# Patient Record
Sex: Male | Born: 1945 | Race: Black or African American | Hispanic: No | Marital: Married | Smoking: Former smoker
Health system: Southern US, Community
[De-identification: ages and names within clinical notes are randomized; demographics above are authoritative.]

## PROBLEM LIST (undated history)

## (undated) DIAGNOSIS — I739 Peripheral vascular disease, unspecified: Secondary | ICD-10-CM

## (undated) DIAGNOSIS — R972 Elevated prostate specific antigen [PSA]: Secondary | ICD-10-CM

## (undated) DIAGNOSIS — H9190 Unspecified hearing loss, unspecified ear: Secondary | ICD-10-CM

## (undated) DIAGNOSIS — E119 Type 2 diabetes mellitus without complications: Secondary | ICD-10-CM

## (undated) DIAGNOSIS — Z974 Presence of external hearing-aid: Secondary | ICD-10-CM

## (undated) DIAGNOSIS — Z789 Other specified health status: Secondary | ICD-10-CM

## (undated) DIAGNOSIS — C679 Malignant neoplasm of bladder, unspecified: Secondary | ICD-10-CM

## (undated) DIAGNOSIS — E785 Hyperlipidemia, unspecified: Secondary | ICD-10-CM

## (undated) DIAGNOSIS — I214 Non-ST elevation (NSTEMI) myocardial infarction: Secondary | ICD-10-CM

## (undated) DIAGNOSIS — I10 Essential (primary) hypertension: Secondary | ICD-10-CM

## (undated) DIAGNOSIS — R944 Abnormal results of kidney function studies: Secondary | ICD-10-CM

## (undated) DIAGNOSIS — I2510 Atherosclerotic heart disease of native coronary artery without angina pectoris: Secondary | ICD-10-CM

## (undated) DIAGNOSIS — M1990 Unspecified osteoarthritis, unspecified site: Secondary | ICD-10-CM

---

## 2000-09-06 ENCOUNTER — Encounter: Payer: Self-pay | Admitting: *Deleted

## 2000-09-06 ENCOUNTER — Ambulatory Visit (HOSPITAL_COMMUNITY): Admission: RE | Admit: 2000-09-06 | Discharge: 2000-09-06 | Payer: Self-pay | Admitting: *Deleted

## 2002-09-10 ENCOUNTER — Ambulatory Visit (HOSPITAL_COMMUNITY): Admission: RE | Admit: 2002-09-10 | Discharge: 2002-09-10 | Payer: Self-pay | Admitting: Family Medicine

## 2002-09-10 ENCOUNTER — Encounter: Payer: Self-pay | Admitting: Family Medicine

## 2006-05-04 ENCOUNTER — Ambulatory Visit (HOSPITAL_COMMUNITY): Admission: RE | Admit: 2006-05-04 | Discharge: 2006-05-04 | Payer: Self-pay | Admitting: Family Medicine

## 2006-06-19 ENCOUNTER — Ambulatory Visit (HOSPITAL_COMMUNITY): Admission: RE | Admit: 2006-06-19 | Discharge: 2006-06-19 | Payer: Self-pay | Admitting: Internal Medicine

## 2006-06-19 ENCOUNTER — Ambulatory Visit: Payer: Self-pay | Admitting: Internal Medicine

## 2012-07-31 ENCOUNTER — Ambulatory Visit (HOSPITAL_COMMUNITY)
Admission: RE | Admit: 2012-07-31 | Discharge: 2012-07-31 | Disposition: A | Discharge status: Home / Self Care | Payer: Medicare Other | Source: Ambulatory Visit | Attending: Physician Assistant | Admitting: Physician Assistant

## 2012-07-31 ENCOUNTER — Other Ambulatory Visit (HOSPITAL_COMMUNITY): Payer: Self-pay | Admitting: Physician Assistant

## 2012-07-31 DIAGNOSIS — R079 Chest pain, unspecified: Secondary | ICD-10-CM | POA: Insufficient documentation

## 2012-07-31 DIAGNOSIS — X500XXA Overexertion from strenuous movement or load, initial encounter: Secondary | ICD-10-CM | POA: Insufficient documentation

## 2012-07-31 DIAGNOSIS — S298XXA Other specified injuries of thorax, initial encounter: Secondary | ICD-10-CM | POA: Insufficient documentation

## 2012-10-01 ENCOUNTER — Other Ambulatory Visit (HOSPITAL_COMMUNITY): Payer: Self-pay | Admitting: Family Medicine

## 2012-10-01 DIAGNOSIS — N644 Mastodynia: Secondary | ICD-10-CM

## 2012-10-02 ENCOUNTER — Ambulatory Visit: Payer: Medicare Other | Admitting: Urology

## 2012-10-10 ENCOUNTER — Ambulatory Visit (HOSPITAL_COMMUNITY)
Admission: RE | Admit: 2012-10-10 | Discharge: 2012-10-10 | Disposition: A | Discharge status: Home / Self Care | Payer: Medicare Other | Source: Ambulatory Visit | Attending: Family Medicine | Admitting: Family Medicine

## 2012-10-10 DIAGNOSIS — N644 Mastodynia: Secondary | ICD-10-CM

## 2013-01-29 ENCOUNTER — Ambulatory Visit: Payer: Medicare Other | Admitting: Urology

## 2013-02-26 ENCOUNTER — Ambulatory Visit (INDEPENDENT_AMBULATORY_CARE_PROVIDER_SITE_OTHER): Payer: Medicare Other | Admitting: Urology

## 2013-02-26 ENCOUNTER — Other Ambulatory Visit: Payer: Self-pay | Admitting: Urology

## 2013-02-26 DIAGNOSIS — R310 Gross hematuria: Secondary | ICD-10-CM

## 2013-02-26 DIAGNOSIS — N400 Enlarged prostate without lower urinary tract symptoms: Secondary | ICD-10-CM

## 2013-02-26 DIAGNOSIS — R972 Elevated prostate specific antigen [PSA]: Secondary | ICD-10-CM

## 2013-02-26 DIAGNOSIS — C679 Malignant neoplasm of bladder, unspecified: Secondary | ICD-10-CM

## 2013-03-01 ENCOUNTER — Ambulatory Visit (HOSPITAL_COMMUNITY)
Admission: RE | Admit: 2013-03-01 | Discharge: 2013-03-01 | Disposition: A | Discharge status: Home / Self Care | Payer: Medicare Other | Source: Ambulatory Visit | Attending: Urology | Admitting: Urology

## 2013-03-01 DIAGNOSIS — N329 Bladder disorder, unspecified: Secondary | ICD-10-CM | POA: Insufficient documentation

## 2013-03-01 DIAGNOSIS — I10 Essential (primary) hypertension: Secondary | ICD-10-CM | POA: Insufficient documentation

## 2013-03-01 DIAGNOSIS — E119 Type 2 diabetes mellitus without complications: Secondary | ICD-10-CM | POA: Insufficient documentation

## 2013-03-01 DIAGNOSIS — R319 Hematuria, unspecified: Secondary | ICD-10-CM | POA: Insufficient documentation

## 2013-03-01 DIAGNOSIS — K7689 Other specified diseases of liver: Secondary | ICD-10-CM | POA: Insufficient documentation

## 2013-03-01 DIAGNOSIS — K5730 Diverticulosis of large intestine without perforation or abscess without bleeding: Secondary | ICD-10-CM | POA: Insufficient documentation

## 2013-03-01 DIAGNOSIS — R310 Gross hematuria: Secondary | ICD-10-CM

## 2013-03-01 MED ORDER — IOHEXOL 300 MG/ML  SOLN
125.0000 mL | Freq: Once | INTRAMUSCULAR | Status: AC | PRN
  Administered 2013-03-01: 125 mL via INTRAVENOUS

## 2013-03-05 ENCOUNTER — Other Ambulatory Visit: Payer: Self-pay | Admitting: Urology

## 2013-03-07 ENCOUNTER — Encounter (HOSPITAL_BASED_OUTPATIENT_CLINIC_OR_DEPARTMENT_OTHER): Payer: Self-pay | Admitting: *Deleted

## 2013-03-08 MED ORDER — MITOMYCIN CHEMO FOR BLADDER INSTILLATION 40 MG: 40.0000 mg | Freq: Once | INTRAVENOUS | Status: DC

## 2013-03-11 ENCOUNTER — Encounter (HOSPITAL_BASED_OUTPATIENT_CLINIC_OR_DEPARTMENT_OTHER): Payer: Self-pay | Admitting: *Deleted

## 2013-03-11 NOTE — Progress Notes (Signed)
NPO AFTER MN. ARRIVES AT 0615. NEEDS ISTAT AND EKG.

## 2013-03-13 NOTE — H&P (Signed)
  Urology History and Physical Exam  CC: Bladder tumor  HPI: 67 year old male presents for TUR-BT of a newly discovered 2 cm papillary bladder tumor. He as been followed for an elevated PSA but was recently found to have hematuria with findings of a bladder tumor.  PMH: Past Medical History  Diagnosis Date  . Bladder tumor   . Diabetes mellitus, type 2   . Hypertension   . Gross hematuria     PSH: History reviewed. No pertinent past surgical history.  Allergies: No Known Allergies  Medications: No prescriptions prior to admission     Social History: History   Social History  . Marital Status: Married    Spouse Name: N/A    Number of Children: N/A  . Years of Education: N/A   Occupational History  . Not on file.   Social History Main Topics  . Smoking status: Former Smoker -- 1.00 packs/day for 30 years    Types: Cigarettes    Quit date: 03/11/1993  . Smokeless tobacco: Never Used  . Alcohol Use: No  . Drug Use: No  . Sexually Active: Not on file   Other Topics Concern  . Not on file   Social History Narrative  . No narrative on file    Family History: History reviewed. No pertinent family history.  Review of Systems: Positive: Blood in urine Negative:  A further 10 point review of systems was negative except what is listed in the HPI.  Physical Exam: @VITALS2 @ General: No acute distress.  Awake. Head:  Normocephalic.  Atraumatic. ENT:  EOMI.  Mucous membranes moist Neck:  Supple.  No lymphadenopathy. CV:  S1 present. S2 present. Regular rate. Pulmonary: Equal effort bilaterally.  Clear to auscultation bilaterally. Abdomen: Soft.  Nn tender to palpation. Skin:  Normal turgor.  No visible rash. Extremity: No gross deformity of bilateral upper extremities.  No gross deformity of    bilateral lower extremities. Neurologic: Alert. Appropriate mood.    Studies:  No results found for this basename: HGB, WBC, PLT,  in the last 72 hours  No  results found for this basename: NA, K, CL, CO2, BUN, CREATININE, CALCIUM, MAGNESIUM, GFRNONAA, GFRAA,  in the last 72 hours   No results found for this basename: PT, INR, APTT,  in the last 72 hours   No components found with this basename: ABG,     Assessment:  Bladder tumor  Plan: TUR-BT

## 2013-03-14 ENCOUNTER — Ambulatory Visit (HOSPITAL_BASED_OUTPATIENT_CLINIC_OR_DEPARTMENT_OTHER): Payer: Medicare Other | Admitting: Anesthesiology

## 2013-03-14 ENCOUNTER — Ambulatory Visit (HOSPITAL_BASED_OUTPATIENT_CLINIC_OR_DEPARTMENT_OTHER)
Admission: RE | Admit: 2013-03-14 | Discharge: 2013-03-14 | Disposition: A | Discharge status: Home / Self Care | Payer: Medicare Other | Source: Ambulatory Visit | Attending: Urology | Admitting: Urology

## 2013-03-14 ENCOUNTER — Encounter (HOSPITAL_BASED_OUTPATIENT_CLINIC_OR_DEPARTMENT_OTHER): Payer: Self-pay | Admitting: Anesthesiology

## 2013-03-14 ENCOUNTER — Encounter (HOSPITAL_BASED_OUTPATIENT_CLINIC_OR_DEPARTMENT_OTHER): Payer: Self-pay

## 2013-03-14 ENCOUNTER — Encounter (HOSPITAL_BASED_OUTPATIENT_CLINIC_OR_DEPARTMENT_OTHER)
Admission: RE | Disposition: A | Discharge status: Home / Self Care | Payer: Self-pay | Source: Ambulatory Visit | Attending: Urology

## 2013-03-14 DIAGNOSIS — E669 Obesity, unspecified: Secondary | ICD-10-CM | POA: Insufficient documentation

## 2013-03-14 DIAGNOSIS — C679 Malignant neoplasm of bladder, unspecified: Secondary | ICD-10-CM | POA: Insufficient documentation

## 2013-03-14 DIAGNOSIS — Z87891 Personal history of nicotine dependence: Secondary | ICD-10-CM | POA: Insufficient documentation

## 2013-03-14 DIAGNOSIS — I10 Essential (primary) hypertension: Secondary | ICD-10-CM | POA: Insufficient documentation

## 2013-03-14 DIAGNOSIS — Z6832 Body mass index (BMI) 32.0-32.9, adult: Secondary | ICD-10-CM | POA: Insufficient documentation

## 2013-03-14 DIAGNOSIS — E119 Type 2 diabetes mellitus without complications: Secondary | ICD-10-CM | POA: Insufficient documentation

## 2013-03-14 HISTORY — DX: Type 2 diabetes mellitus without complications: E11.9

## 2013-03-14 HISTORY — PX: TRANSURETHRAL RESECTION OF BLADDER TUMOR: SHX2575

## 2013-03-14 HISTORY — DX: Essential (primary) hypertension: I10

## 2013-03-14 LAB — POCT I-STAT 4, (NA,K, GLUC, HGB,HCT): Sodium: 140 mEq/L (ref 135–145)

## 2013-03-14 SURGERY — TURBT (TRANSURETHRAL RESECTION OF BLADDER TUMOR)
Anesthesia: General | Site: Bladder | Wound class: Clean Contaminated

## 2013-03-14 MED ORDER — FENTANYL CITRATE 0.05 MG/ML IJ SOLN
INTRAMUSCULAR | Status: DC | PRN
  Administered 2013-03-14 (×2): 50 ug via INTRAVENOUS

## 2013-03-14 MED ORDER — STERILE WATER FOR IRRIGATION IR SOLN
Status: DC | PRN
  Administered 2013-03-14: 1000 mL

## 2013-03-14 MED ORDER — MITOMYCIN CHEMO FOR BLADDER INSTILLATION 40 MG
40.0000 mg | Freq: Once | INTRAVENOUS | Status: AC
  Administered 2013-03-14: 40 mg via INTRAVESICAL
  Filled 2013-03-14: qty 40

## 2013-03-14 MED ORDER — OXYCODONE HCL 5 MG/5ML PO SOLN
5.0000 mg | Freq: Once | ORAL | Status: DC | PRN
  Filled 2013-03-14: qty 5

## 2013-03-14 MED ORDER — DEXAMETHASONE SODIUM PHOSPHATE 4 MG/ML IJ SOLN
INTRAMUSCULAR | Status: DC | PRN
  Administered 2013-03-14: 10 mg via INTRAVENOUS

## 2013-03-14 MED ORDER — HYDROMORPHONE HCL PF 1 MG/ML IJ SOLN
0.2500 mg | INTRAMUSCULAR | Status: DC | PRN
  Administered 2013-03-14: 0.25 mg via INTRAVENOUS
  Filled 2013-03-14: qty 1

## 2013-03-14 MED ORDER — LIDOCAINE HCL (CARDIAC) 20 MG/ML IV SOLN
INTRAVENOUS | Status: DC | PRN
  Administered 2013-03-14: 100 mg via INTRAVENOUS

## 2013-03-14 MED ORDER — ACETAMINOPHEN 10 MG/ML IV SOLN
INTRAVENOUS | Status: DC | PRN
  Administered 2013-03-14: 1000 mg via INTRAVENOUS

## 2013-03-14 MED ORDER — CIPROFLOXACIN IN D5W 400 MG/200ML IV SOLN
400.0000 mg | INTRAVENOUS | Status: AC
  Administered 2013-03-14: 400 mg via INTRAVENOUS
  Filled 2013-03-14: qty 200

## 2013-03-14 MED ORDER — CEPHALEXIN 500 MG PO CAPS: 500.0000 mg | ORAL_CAPSULE | Freq: Two times a day (BID) | ORAL | Status: DC

## 2013-03-14 MED ORDER — SODIUM CHLORIDE 0.9 % IR SOLN
Status: DC | PRN
  Administered 2013-03-14: 6000 mL

## 2013-03-14 MED ORDER — PROMETHAZINE HCL 25 MG/ML IJ SOLN
6.2500 mg | INTRAMUSCULAR | Status: DC | PRN
  Filled 2013-03-14: qty 1

## 2013-03-14 MED ORDER — PROPOFOL 10 MG/ML IV BOLUS
INTRAVENOUS | Status: DC | PRN
  Administered 2013-03-14: 200 mg via INTRAVENOUS

## 2013-03-14 MED ORDER — MEPERIDINE HCL 25 MG/ML IJ SOLN
6.2500 mg | INTRAMUSCULAR | Status: DC | PRN
  Filled 2013-03-14: qty 1

## 2013-03-14 MED ORDER — ONDANSETRON HCL 4 MG/2ML IJ SOLN
INTRAMUSCULAR | Status: DC | PRN
  Administered 2013-03-14: 4 mg via INTRAVENOUS

## 2013-03-14 MED ORDER — HYDROCODONE-ACETAMINOPHEN 5-325 MG PO TABS
1.0000 | ORAL_TABLET | Freq: Once | ORAL | Status: AC
  Administered 2013-03-14: 1 via ORAL
  Filled 2013-03-14: qty 1

## 2013-03-14 MED ORDER — MIDAZOLAM HCL 5 MG/5ML IJ SOLN
INTRAMUSCULAR | Status: DC | PRN
  Administered 2013-03-14: 2 mg via INTRAVENOUS

## 2013-03-14 MED ORDER — HYDROCODONE-ACETAMINOPHEN 5-325 MG PO TABS: 1.0000 | ORAL_TABLET | ORAL | Status: DC | PRN

## 2013-03-14 MED ORDER — ACETAMINOPHEN 10 MG/ML IV SOLN
1000.0000 mg | Freq: Once | INTRAVENOUS | Status: DC | PRN
  Filled 2013-03-14: qty 100

## 2013-03-14 MED ORDER — LACTATED RINGERS IV SOLN
INTRAVENOUS | Status: DC
  Administered 2013-03-14: 07:00:00 via INTRAVENOUS
  Filled 2013-03-14: qty 1000

## 2013-03-14 MED ORDER — BELLADONNA ALKALOIDS-OPIUM 16.2-60 MG RE SUPP
RECTAL | Status: DC | PRN
  Administered 2013-03-14: 1 via RECTAL

## 2013-03-14 MED ORDER — OXYCODONE HCL 5 MG PO TABS
5.0000 mg | ORAL_TABLET | Freq: Once | ORAL | Status: DC | PRN
  Filled 2013-03-14: qty 1

## 2013-03-14 SURGICAL SUPPLY — 31 items
BAG DRAIN URO-CYSTO SKYTR STRL (DRAIN) ×2 IMPLANT
BAG DRN ANRFLXCHMBR STRAP LEK (BAG)
BAG DRN UROCATH (DRAIN) ×1
BAG URINE DRAINAGE (UROLOGICAL SUPPLIES) ×1 IMPLANT
BAG URINE LEG 19OZ MD ST LTX (BAG) IMPLANT
CANISTER SUCT LVC 12 LTR MEDI- (MISCELLANEOUS) IMPLANT
CATH FOLEY 2WAY SLVR  5CC 20FR (CATHETERS)
CATH FOLEY 2WAY SLVR  5CC 22FR (CATHETERS)
CATH FOLEY 2WAY SLVR 5CC 20FR (CATHETERS) IMPLANT
CATH FOLEY 2WAY SLVR 5CC 22FR (CATHETERS) IMPLANT
CLOTH BEACON ORANGE TIMEOUT ST (SAFETY) ×2 IMPLANT
DRAPE CAMERA CLOSED 9X96 (DRAPES) ×2 IMPLANT
ELECT BUTTON BIOP 24F 90D PLAS (MISCELLANEOUS) IMPLANT
ELECT LOOP HF 26F 30D .35MM (CUTTING LOOP) IMPLANT
ELECT LOOP MED HF 24F 12D CBL (CLIP) ×1 IMPLANT
ELECT NEEDLE 45D HF 24-28F 12D (CUTTING LOOP) IMPLANT
ELECT REM PT RETURN 9FT ADLT (ELECTROSURGICAL) ×2
ELECTRODE REM PT RTRN 9FT ADLT (ELECTROSURGICAL) ×1 IMPLANT
EVACUATOR MICROVAS BLADDER (UROLOGICAL SUPPLIES) IMPLANT
GLOVE BIO SURGEON STRL SZ7 (GLOVE) ×1 IMPLANT
GLOVE BIO SURGEON STRL SZ8 (GLOVE) ×2 IMPLANT
GLOVE INDICATOR 7.5 STRL GRN (GLOVE) ×1 IMPLANT
GOWN STRL REIN XL XLG (GOWN DISPOSABLE) ×2 IMPLANT
GOWN XL W/COTTON TOWEL STD (GOWNS) ×2 IMPLANT
HOLDER FOLEY CATH W/STRAP (MISCELLANEOUS) IMPLANT
KIT ASPIRATION TUBING (SET/KITS/TRAYS/PACK) IMPLANT
LOOP CUTTING 24FR OLYMPUS (CUTTING LOOP) IMPLANT
PACK CYSTOSCOPY (CUSTOM PROCEDURE TRAY) ×2 IMPLANT
PLUG CATH AND CAP STER (CATHETERS) IMPLANT
SET ASPIRATION TUBING (TUBING) IMPLANT
SYRINGE IRR TOOMEY STRL 70CC (SYRINGE) IMPLANT

## 2013-03-14 NOTE — Preoperative (Signed)
Beta Blockers   Reason not to administer Beta Blockers:Hold beta blocker due to other 

## 2013-03-14 NOTE — Anesthesia Preprocedure Evaluation (Addendum)
Anesthesia Evaluation  Patient identified by MRN, date of birth, ID band Patient awake    Reviewed: Allergy & Precautions, H&P , NPO status , Patient's Chart, lab work & pertinent test results, reviewed documented beta blocker date and time   Airway Mallampati: II TM Distance: >3 FB Neck ROM: Full    Dental  (+) Dental Advisory Given   Pulmonary neg pulmonary ROS,  breath sounds clear to auscultation        Cardiovascular hypertension, Pt. on medications and Pt. on home beta blockers Rhythm:Regular Rate:Normal     Neuro/Psych negative neurological ROS  negative psych ROS   GI/Hepatic negative GI ROS, Neg liver ROS,   Endo/Other  diabetes, Type 2, Oral Hypoglycemic Agents  Renal/GU negative Renal ROS     Musculoskeletal negative musculoskeletal ROS (+)   Abdominal (+) + obese,   Peds  Hematology negative hematology ROS (+)   Anesthesia Other Findings   Reproductive/Obstetrics                          Anesthesia Physical Anesthesia Plan  ASA: III  Anesthesia Plan: General   Post-op Pain Management:    Induction: Intravenous  Airway Management Planned: LMA  Additional Equipment:   Intra-op Plan:   Post-operative Plan: Extubation in OR  Informed Consent: I have reviewed the patients History and Physical, chart, labs and discussed the procedure including the risks, benefits and alternatives for the proposed anesthesia with the patient or authorized representative who has indicated his/her understanding and acceptance.   Dental advisory given  Plan Discussed with: CRNA  Anesthesia Plan Comments:         Anesthesia Quick Evaluation

## 2013-03-14 NOTE — Anesthesia Procedure Notes (Signed)
Procedure Name: LMA Insertion Date/Time: 03/14/2013 7:35 AM Performed by: Maris Berger T Pre-anesthesia Checklist: Patient identified, Emergency Drugs available, Suction available and Patient being monitored Patient Re-evaluated:Patient Re-evaluated prior to inductionOxygen Delivery Method: Circle System Utilized Preoxygenation: Pre-oxygenation with 100% oxygen Intubation Type: IV induction Ventilation: Mask ventilation without difficulty LMA: LMA inserted LMA Size: 5.0 Number of attempts: 1 Airway Equipment and Method: bite block Placement Confirmation: positive ETCO2 Dental Injury: Teeth and Oropharynx as per pre-operative assessment

## 2013-03-14 NOTE — Discharge Instructions (Signed)
Transurethral Resection of Bladder Tumor (TURBT)  °Definition:  °Transurethral Resection of the Bladder Tumor is a surgical procedure used to diagnose and remove tumors within the bladder. TURBT is the most common treatment for early stage bladder cancer.  ° °General instructions:  °Your recent bladder surgery requires very little post hospital care but some definite precautions.  °Despite the fact that no skin incisions were used, the area around the tumor removal site is raw and covered with scabs to promote healing and prevent bleeding. Certain precautions are needed to insure that the scabs are not disturbed over the next 2-4 weeks while the healing proceeds.  °Because the raw surface inside your bladder and the irritating effects of urine you may expect frequency of urination and/or urgency (a stronger desire to urinate) and perhaps even getting up at night more often. This will usually resolve or improve slowly over the healing period. You may see some blood in your urine over the first 6 weeks. Do not be alarmed, even if the urine was clear for a while. Get off your feet and drink lots of fluids until clearing occurs. If you start to pass clots or don't improve call us.  ° °Diet:  °You may return to your normal diet immediately. Because of the raw surface of your bladder, alcohol, spicy foods, foods high in acid and drinks with caffeine may cause irritation or frequency and should be used in moderation. To keep your urine flowing freely and avoid constipation, drink plenty of fluids during the day (8-10 glasses). Tip: Avoid cranberry juice because it is very acidic. °  °Activity:  °Your physical activity needs to be restricted somewhat.  We suggest that you reduce your activity under the circumstances until the bleeding has stopped. Heavy lifting (greater than 20 lbs.) and heavy exertion should be limited for 2-3 weeks. ° °Bowels:  °It is important to keep your bowels regular during the postoperative period.  Straining with bowel movements can cause bleeding. A bowel movement every other day is reasonable. Use a mild laxative if needed, such as milk of magnesia 2-3 tablespoons, or 2 Dulcolax tablets. Call if you continue to have problems. If you had been taking narcotics for pain, before, during or after your surgery, you may be constipated.  ° °Medication:  °You should resume your pre-surgery medications unless told not to. In addition you may be given an antibiotic to prevent or treat infection. Antibiotics are not always necessary. All medication should be taken as prescribed until the bottles are finished unless you are having an unusual reaction to one of the drugs.  °General Anesthetic, Adult  °A doctor specialized in giving anesthesia (anesthesiologist) or a nurse specialized in giving anesthesia (nurse anesthetist) gives medicine that makes you sleep while a procedure is performed (general anesthetic). Once the general anesthetic has been administered, you will be in a sleeplike state in which you feel no pain. After having a general anesthetic you may feel:  °Dizzy.  °Weak.  °Drowsy.  °Confused.  °These feelings are normal and can be expected to last for up to 24 hours after the procedure. ° ° °LET YOUR CAREGIVER KNOW ABOUT:  °Allergies you have.  °Medications you are taking, including herbs, eye drops, over the counter medications, dietary supplements, and creams.  °Previous problems you have had with anesthetics or numbing medicines.  °Use of cigarettes, alcohol, or illicit drugs.  °Possibility of pregnancy, if this applies.  °History of bleeding or blood disorders, including blood clots and clotting   disorders.  °Previous surgeries you have had and types of anesthetics you have received.  °Family medical history, especially anesthetic problems.  °Other health problems.  ° °AFTER THE PROCEDURE  °After surgery, you will be taken to the recovery area where a nurse will monitor your progress. You will be allowed  to go home when you are awake, stable, taking fluids well, and without serious pain or complications.  °For the first 24 hours following an anesthetic:  °Have a responsible person with you.  °Do not drive a car. If you are alone, do not take public transportation.  °Do not engage in strenuous activity. You may usually resume normal activities the next day, or as advised by your caregiver.  °Do not drink alcohol.  °Do not take medicine that has not been prescribed by your caregiver.  °Do not sign important papers or make important decisions as your judgement may be impaired.  °You may resume a normal diet as directed.  °Change bandages (dressings) as directed.  °Only take over-the-counter or prescription medicines for pain, discomfort, or fever as directed by your caregiver.  °If you have questions or problems that seem related to the anesthetic, call the hospital and ask for the anesthetist, anesthesiologist, or anesthesia department.  ° °SEEK IMMEDIATE MEDICAL CARE IF:  °You develop a rash.  °You have difficulty breathing.  °You have chest pain.  °You have allergic problems.  °You have uncontrolled nausea.  °You have uncontrolled vomiting.  °You develop any serious bleeding, especially from the incision site.  °Document Released: 02/28/2008 Document Revised: 08/03/2011 Document Reviewed: 03/24/2011  °[employer]® Patient Information ©2012 [employer].  ° ° °Post Anesthesia Home Care Instructions ° °Activity: °Get plenty of rest for the remainder of the day. A responsible adult should stay with you for 24 hours following the procedure.  °For the next 24 hours, DO NOT: °-Drive a car °-Operate machinery °-Drink alcoholic beverages °-Take any medication unless instructed by your physician °-Make any legal decisions or sign important papers. ° °Meals: °Start with liquid foods such as gelatin or soup. Progress to regular foods as tolerated. Avoid greasy, spicy, heavy foods. If nausea and/or vomiting occur, drink only  clear liquids until the nausea and/or vomiting subsides. Call your physician if vomiting continues. ° °Special Instructions/Symptoms: °Your throat may feel dry or sore from the anesthesia or the breathing tube placed in your throat during surgery. If this causes discomfort, gargle with warm salt water. The discomfort should disappear within 24 hours. ° °

## 2013-03-14 NOTE — Transfer of Care (Signed)
Immediate Anesthesia Transfer of Care Note  Patient: Timothy [name]  Procedure(s) Performed: Procedure(s) with comments: TRANSURETHRAL RESECTION OF BLADDER TUMOR (TURBT) (N/A) - 1 HR WITH MITOMYCIN INSTILLATION   Patient Location: PACU  Anesthesia Type:General  Level of Consciousness: sedated  Airway & Oxygen Therapy: Patient Spontanous Breathing and Patient connected to face mask oxygen  Post-op Assessment: Report given to PACU RN  Post vital signs: Reviewed and stable  Complications: No apparent anesthesia complications

## 2013-03-14 NOTE — Anesthesia Postprocedure Evaluation (Signed)
Anesthesia Post Note  Patient: Timothy [name]  Procedure(s) Performed: Procedure(s) (LRB): TRANSURETHRAL RESECTION OF BLADDER TUMOR (TURBT) (N/A)  Anesthesia type: General  Patient location: PACU  Post pain: Pain level controlled  Post assessment: Post-op Vital signs reviewed  Last Vitals: BP 126/79  Pulse 66  Temp(Src) 36.4 C (Oral)  Resp 12  Ht 5\' 8"  (1.727 m)  Wt 216 lb (97.977 kg)  BMI 32.85 kg/m2  SpO2 92%  Post vital signs: Reviewed  Level of consciousness: sedated  Complications: No apparent anesthesia complications

## 2013-03-14 NOTE — Op Note (Signed)
Preoperative diagnosis: Bladder tumor, 15 mm  Postoperative diagnosis: Same   Procedure: TURBT, placement of mitomycin in bladder    Surgeon: Bertram Millard. Briannia Laba, M.D.   Anesthesia: Gen.   Complications: None  Specimen(s): Bladder tumor, to pathology  Drain(s): 18 French Foley catheter  Indications: 67 year-old male with recently diagnosed bladder tumor. He has a left bladder wall tumor above the trigone, approximately 15 mm in size. He presents at this time for TURBT. Risks and complications have been discussed with him. He understands these and desires to proceed.    Technique and findings: The patient was properly identified in the holding area and received preoperative IV antibiotics. He was taken to the operating room where general anesthetic was administered with the LMA. He was placed in the dorsolithotomy position. Genitalia and perineum were prepped and draped. Proper timeout was then performed.  A 26 French resectoscope sheath was placed with the obturator. The resectoscope element and a 24 cutting loop were then placed. Inspection of the bladder was carried out. There was minimal obstruction from the prostate. No significant bladder lesions were noted other than a 15 mm papillary lesion with calcifications superficially approximately 2 cm lateral and superior to the left ureteral orifice. There were moderate trabeculations. No bladder stones were noted. Both ureteral orifices were normal in configuration and location.  Using the cutting loop, I then resected the bladder tumor down into the muscular layer, removing all identifiable tumor. There was no evident perforation. The base of the bladder tumor was then cauterized, and no significant bleeding was seen. Bladder tumor fragments were irrigated from the bladder and sent to pathology as "bladder tumor". No further tumor being left, and hemostasis be in adequate, the scope was removed and a n 18 French Foley catheter was placed.  This was hooked to dependent drainage.  He was taken to the PACU in stable condition, having tolerated procedure well. 40 mg of mitomycin was instilled in the bladder the PACU.

## 2013-03-15 ENCOUNTER — Encounter (HOSPITAL_BASED_OUTPATIENT_CLINIC_OR_DEPARTMENT_OTHER): Payer: Self-pay | Admitting: Urology

## 2013-04-09 ENCOUNTER — Ambulatory Visit (INDEPENDENT_AMBULATORY_CARE_PROVIDER_SITE_OTHER): Payer: Medicare Other | Admitting: Urology

## 2013-04-09 DIAGNOSIS — R972 Elevated prostate specific antigen [PSA]: Secondary | ICD-10-CM

## 2013-04-09 DIAGNOSIS — C679 Malignant neoplasm of bladder, unspecified: Secondary | ICD-10-CM

## 2013-05-07 ENCOUNTER — Ambulatory Visit (INDEPENDENT_AMBULATORY_CARE_PROVIDER_SITE_OTHER): Payer: Medicare Other | Admitting: Urology

## 2013-05-07 DIAGNOSIS — R300 Dysuria: Secondary | ICD-10-CM

## 2013-05-07 DIAGNOSIS — R972 Elevated prostate specific antigen [PSA]: Secondary | ICD-10-CM

## 2013-05-07 DIAGNOSIS — C679 Malignant neoplasm of bladder, unspecified: Secondary | ICD-10-CM

## 2013-06-11 ENCOUNTER — Ambulatory Visit (INDEPENDENT_AMBULATORY_CARE_PROVIDER_SITE_OTHER): Payer: Medicare Other | Admitting: Urology

## 2013-06-11 DIAGNOSIS — C679 Malignant neoplasm of bladder, unspecified: Secondary | ICD-10-CM

## 2013-06-11 DIAGNOSIS — R972 Elevated prostate specific antigen [PSA]: Secondary | ICD-10-CM

## 2013-06-11 DIAGNOSIS — R300 Dysuria: Secondary | ICD-10-CM

## 2013-09-10 ENCOUNTER — Ambulatory Visit (INDEPENDENT_AMBULATORY_CARE_PROVIDER_SITE_OTHER): Payer: Medicare Other | Admitting: Urology

## 2013-09-10 DIAGNOSIS — R972 Elevated prostate specific antigen [PSA]: Secondary | ICD-10-CM

## 2013-09-10 DIAGNOSIS — N400 Enlarged prostate without lower urinary tract symptoms: Secondary | ICD-10-CM

## 2013-09-10 DIAGNOSIS — C679 Malignant neoplasm of bladder, unspecified: Secondary | ICD-10-CM

## 2013-09-23 ENCOUNTER — Other Ambulatory Visit: Payer: Self-pay | Admitting: Urology

## 2013-09-24 ENCOUNTER — Encounter (HOSPITAL_BASED_OUTPATIENT_CLINIC_OR_DEPARTMENT_OTHER): Payer: Self-pay | Admitting: *Deleted

## 2013-09-26 ENCOUNTER — Encounter (HOSPITAL_BASED_OUTPATIENT_CLINIC_OR_DEPARTMENT_OTHER): Payer: Self-pay | Admitting: *Deleted

## 2013-09-26 NOTE — Progress Notes (Signed)
09/26/13 0933  OBSTRUCTIVE SLEEP APNEA  Have you ever been diagnosed with sleep apnea through a sleep study? No  Do you snore loudly (loud enough to be heard through closed doors)?  1  Do you often feel tired, fatigued, or sleepy during the daytime? 0  Has anyone observed you stop breathing during your sleep? 0  Do you have, or are you being treated for high blood pressure? 1  BMI more than 35 kg/m2? 0  Age over 66 years old? 1  Neck circumference greater than 40 cm/18 inches? 0  Gender: 1  Obstructive Sleep Apnea Score 4  Score 4 or greater  Results sent to PCP

## 2013-09-26 NOTE — Progress Notes (Signed)
NPO AFTER MN. ARRIVE AT 0630. NEEDS ISTAT. CURRENT EKG IN EPIC AND CHART.

## 2013-09-30 ENCOUNTER — Encounter (HOSPITAL_BASED_OUTPATIENT_CLINIC_OR_DEPARTMENT_OTHER)
Admission: RE | Disposition: A | Discharge status: Home / Self Care | Payer: Self-pay | Source: Ambulatory Visit | Attending: Urology

## 2013-09-30 ENCOUNTER — Encounter (HOSPITAL_BASED_OUTPATIENT_CLINIC_OR_DEPARTMENT_OTHER): Payer: Medicare Other | Admitting: Anesthesiology

## 2013-09-30 ENCOUNTER — Encounter (HOSPITAL_BASED_OUTPATIENT_CLINIC_OR_DEPARTMENT_OTHER): Payer: Self-pay | Admitting: *Deleted

## 2013-09-30 ENCOUNTER — Ambulatory Visit (HOSPITAL_BASED_OUTPATIENT_CLINIC_OR_DEPARTMENT_OTHER)
Admission: RE | Admit: 2013-09-30 | Discharge: 2013-09-30 | Disposition: A | Discharge status: Home / Self Care | Payer: Medicare Other | Source: Ambulatory Visit | Attending: Urology | Admitting: Urology

## 2013-09-30 ENCOUNTER — Ambulatory Visit (HOSPITAL_BASED_OUTPATIENT_CLINIC_OR_DEPARTMENT_OTHER): Payer: Medicare Other | Admitting: Anesthesiology

## 2013-09-30 DIAGNOSIS — I10 Essential (primary) hypertension: Secondary | ICD-10-CM | POA: Insufficient documentation

## 2013-09-30 DIAGNOSIS — C679 Malignant neoplasm of bladder, unspecified: Secondary | ICD-10-CM

## 2013-09-30 DIAGNOSIS — C674 Malignant neoplasm of posterior wall of bladder: Secondary | ICD-10-CM | POA: Insufficient documentation

## 2013-09-30 DIAGNOSIS — E119 Type 2 diabetes mellitus without complications: Secondary | ICD-10-CM | POA: Insufficient documentation

## 2013-09-30 HISTORY — PX: TRANSURETHRAL RESECTION OF BLADDER TUMOR: SHX2575

## 2013-09-30 HISTORY — DX: Malignant neoplasm of bladder, unspecified: C67.9

## 2013-09-30 LAB — POCT I-STAT 4, (NA,K, GLUC, HGB,HCT)
Glucose, Bld: 139 mg/dL — ABNORMAL HIGH (ref 70–99)
HCT: 51 % (ref 39.0–52.0)
Hemoglobin: 17.3 g/dL — ABNORMAL HIGH (ref 13.0–17.0)
Potassium: 3.6 mEq/L (ref 3.5–5.1)
Sodium: 141 mEq/L (ref 135–145)

## 2013-09-30 LAB — GLUCOSE, CAPILLARY: Glucose-Capillary: 124 mg/dL — ABNORMAL HIGH (ref 70–99)

## 2013-09-30 SURGERY — TURBT (TRANSURETHRAL RESECTION OF BLADDER TUMOR)
Anesthesia: General | Site: Bladder | Wound class: Clean Contaminated

## 2013-09-30 MED ORDER — BELLADONNA ALKALOIDS-OPIUM 16.2-60 MG RE SUPP
RECTAL | Status: DC | PRN
  Administered 2013-09-30: 1 via RECTAL

## 2013-09-30 MED ORDER — MIDAZOLAM HCL 5 MG/5ML IJ SOLN
INTRAMUSCULAR | Status: DC | PRN
  Administered 2013-09-30: 2 mg via INTRAVENOUS

## 2013-09-30 MED ORDER — STERILE WATER FOR IRRIGATION IR SOLN
Status: DC | PRN
  Administered 2013-09-30: 6000 mL

## 2013-09-30 MED ORDER — PROPOFOL 10 MG/ML IV BOLUS
INTRAVENOUS | Status: DC | PRN
  Administered 2013-09-30: 170 mg via INTRAVENOUS

## 2013-09-30 MED ORDER — SODIUM CHLORIDE 0.9 % IJ SOLN
3.0000 mL | INTRAMUSCULAR | Status: DC | PRN
  Filled 2013-09-30: qty 3

## 2013-09-30 MED ORDER — ONDANSETRON HCL 4 MG/2ML IJ SOLN
INTRAMUSCULAR | Status: DC | PRN
  Administered 2013-09-30: 4 mg via INTRAVENOUS

## 2013-09-30 MED ORDER — CEFAZOLIN SODIUM-DEXTROSE 2-3 GM-% IV SOLR
2.0000 g | INTRAVENOUS | Status: AC
  Administered 2013-09-30: 2 g via INTRAVENOUS
  Filled 2013-09-30: qty 50

## 2013-09-30 MED ORDER — PROMETHAZINE HCL 25 MG/ML IJ SOLN
6.2500 mg | INTRAMUSCULAR | Status: DC | PRN
  Filled 2013-09-30: qty 1

## 2013-09-30 MED ORDER — KETOROLAC TROMETHAMINE 30 MG/ML IJ SOLN
15.0000 mg | Freq: Once | INTRAMUSCULAR | Status: DC | PRN
  Filled 2013-09-30: qty 1

## 2013-09-30 MED ORDER — HYDROCODONE-ACETAMINOPHEN 5-325 MG PO TABS
1.0000 | ORAL_TABLET | ORAL | Status: DC | PRN
  Administered 2013-09-30: 1 via ORAL
  Filled 2013-09-30: qty 2

## 2013-09-30 MED ORDER — CEPHALEXIN 500 MG PO CAPS: 500.0000 mg | ORAL_CAPSULE | Freq: Four times a day (QID) | ORAL | Status: DC

## 2013-09-30 MED ORDER — CEFAZOLIN SODIUM 1-5 GM-% IV SOLN
1.0000 g | INTRAVENOUS | Status: DC
  Filled 2013-09-30: qty 50

## 2013-09-30 MED ORDER — OXYCODONE HCL 5 MG PO TABS
5.0000 mg | ORAL_TABLET | ORAL | Status: DC | PRN
  Filled 2013-09-30: qty 2

## 2013-09-30 MED ORDER — MORPHINE SULFATE 2 MG/ML IJ SOLN
2.0000 mg | INTRAMUSCULAR | Status: DC | PRN
  Filled 2013-09-30: qty 1

## 2013-09-30 MED ORDER — ACETAMINOPHEN 325 MG PO TABS
650.0000 mg | ORAL_TABLET | ORAL | Status: DC | PRN
  Filled 2013-09-30: qty 2

## 2013-09-30 MED ORDER — FENTANYL CITRATE 0.05 MG/ML IJ SOLN
25.0000 ug | INTRAMUSCULAR | Status: DC | PRN
  Filled 2013-09-30: qty 1

## 2013-09-30 MED ORDER — ACETAMINOPHEN 650 MG RE SUPP
650.0000 mg | RECTAL | Status: DC | PRN
  Filled 2013-09-30: qty 1

## 2013-09-30 MED ORDER — SODIUM CHLORIDE 0.9 % IJ SOLN
3.0000 mL | Freq: Two times a day (BID) | INTRAMUSCULAR | Status: DC
  Filled 2013-09-30: qty 3

## 2013-09-30 MED ORDER — ONDANSETRON HCL 4 MG/2ML IJ SOLN
4.0000 mg | Freq: Four times a day (QID) | INTRAMUSCULAR | Status: DC | PRN
  Filled 2013-09-30: qty 2

## 2013-09-30 MED ORDER — SODIUM CHLORIDE 0.9 % IV SOLN
250.0000 mL | INTRAVENOUS | Status: DC | PRN
  Filled 2013-09-30: qty 250

## 2013-09-30 MED ORDER — HYDROCODONE-ACETAMINOPHEN 5-325 MG PO TABS: 1.0000 | ORAL_TABLET | ORAL | Status: DC | PRN

## 2013-09-30 MED ORDER — FENTANYL CITRATE 0.05 MG/ML IJ SOLN
INTRAMUSCULAR | Status: DC | PRN
  Administered 2013-09-30: 25 ug via INTRAVENOUS
  Administered 2013-09-30: 50 ug via INTRAVENOUS
  Administered 2013-09-30: 25 ug via INTRAVENOUS

## 2013-09-30 MED ORDER — LIDOCAINE HCL (CARDIAC) 20 MG/ML IV SOLN
INTRAVENOUS | Status: DC | PRN
  Administered 2013-09-30: 60 mg via INTRAVENOUS

## 2013-09-30 MED ORDER — LACTATED RINGERS IV SOLN
INTRAVENOUS | Status: DC
  Administered 2013-09-30: 07:00:00 via INTRAVENOUS
  Filled 2013-09-30: qty 1000

## 2013-09-30 SURGICAL SUPPLY — 31 items
BAG DRAIN URO-CYSTO SKYTR STRL (DRAIN) ×2 IMPLANT
BAG DRN ANRFLXCHMBR STRAP LEK (BAG) ×1
BAG DRN UROCATH (DRAIN) ×1
BAG URINE DRAINAGE (UROLOGICAL SUPPLIES) IMPLANT
BAG URINE LEG 19OZ MD ST LTX (BAG) ×1 IMPLANT
CANISTER SUCT LVC 12 LTR MEDI- (MISCELLANEOUS) ×1 IMPLANT
CATH FOLEY 2WAY SLVR  5CC 20FR (CATHETERS) ×1
CATH FOLEY 2WAY SLVR  5CC 22FR (CATHETERS)
CATH FOLEY 2WAY SLVR 5CC 20FR (CATHETERS) IMPLANT
CATH FOLEY 2WAY SLVR 5CC 22FR (CATHETERS) IMPLANT
CLOTH BEACON ORANGE TIMEOUT ST (SAFETY) ×2 IMPLANT
DRAPE CAMERA CLOSED 9X96 (DRAPES) ×2 IMPLANT
ELECT BUTTON BIOP 24F 90D PLAS (MISCELLANEOUS) IMPLANT
ELECT LOOP HF 26F 30D .35MM (CUTTING LOOP) ×1 IMPLANT
ELECT NEEDLE 45D HF 24-28F 12D (CUTTING LOOP) IMPLANT
ELECT REM PT RETURN 9FT ADLT (ELECTROSURGICAL) ×2
ELECTRODE REM PT RTRN 9FT ADLT (ELECTROSURGICAL) ×1 IMPLANT
EVACUATOR MICROVAS BLADDER (UROLOGICAL SUPPLIES) IMPLANT
GLOVE BIO SURGEON STRL SZ7 (GLOVE) ×1 IMPLANT
GLOVE BIO SURGEON STRL SZ8 (GLOVE) ×2 IMPLANT
GOWN STRL REIN XL XLG (GOWN DISPOSABLE) ×2 IMPLANT
GOWN SURGICAL LARGE (GOWNS) ×2 IMPLANT
GOWN XL W/COTTON TOWEL STD (GOWNS) ×2 IMPLANT
HOLDER FOLEY CATH W/STRAP (MISCELLANEOUS) IMPLANT
KIT ASPIRATION TUBING (SET/KITS/TRAYS/PACK) IMPLANT
LOOP CUTTING 24FR OLYMPUS (CUTTING LOOP) IMPLANT
PACK CYSTOSCOPY (CUSTOM PROCEDURE TRAY) ×2 IMPLANT
PLUG CATH AND CAP STER (CATHETERS) IMPLANT
SET ASPIRATION TUBING (TUBING) IMPLANT
SYRINGE IRR TOOMEY STRL 70CC (SYRINGE) ×1 IMPLANT
WATER STERILE IRR 3000ML UROMA (IV SOLUTION) ×2 IMPLANT

## 2013-09-30 NOTE — H&P (Signed)
  Urology History and Physical Exam  CC: Bladder cancer  HPI: 67 year old presents for TUR-BT for a recurrence of bladder cancer. His history iis as follows:     I have been following him for elevation of his PSA. In March, 2014 was noted to have hematuria, cystoscopy revealed a left-sided papillary tumor, this was resected by TURBT on 03/14/2013. Mitomycin was placed. He had persistent dysuria postoperatively.  He experienced significant dysuria following the procedure for an extended period of time.  At his first follow-up cystoscopy on 06/11/2013, several erythematous areas were noted.  Cytology and FISH were atypical but were nondiagnostic for cancer.   He takes oxybutynin for some discomfort with urination.  He has intermittent dark urine but has not seen any grossly bloody urine.  PSA was recently checked on 09/02/2013 and returns 6.40, free to total ratio was 40%.   Recent office cystoscopy on 09/10/2013 revealed a recurrence of papillary urothelial carcinoma. He presents now for TUR-BT. Because of his severe urinary symptomatology after his 1st mitomycin, it will not be administered today after his procedure.  PMH: Past Medical History  Diagnosis Date  . Diabetes mellitus, type 2   . Hypertension   . Bladder cancer     PSH: Past Surgical History  Procedure Laterality Date  . Transurethral resection of bladder tumor N/A 03/14/2013    Procedure: TRANSURETHRAL RESECTION OF BLADDER TUMOR (TURBT);  Surgeon: Marcine Matar, MD;  Location: [facility];  Service: Urology;  Laterality: N/A;  1 HR WITH MITOMYCIN INSTILLATION     Allergies: No Known Allergies  Medications: No prescriptions prior to admission     Social History: History   Social History  . Marital Status: Married    Spouse Name: N/A    Number of Children: N/A  . Years of Education: N/A   Occupational History  . Not on file.   Social History Main Topics  . Smoking status: Former Smoker --  1.00 packs/day for 30 years    Types: Cigarettes    Quit date: 03/11/1993  . Smokeless tobacco: Never Used  . Alcohol Use: No  . Drug Use: No  . Sexual Activity: Not on file   Other Topics Concern  . Not on file   Social History Narrative  . No narrative on file    Family History: History reviewed. No pertinent family history.  Review of Systems: Positive: Dysuria, urinary frwquency Negative:   A further 10 point review of systems was negative except what is listed in the HPI.  Physical Exam: @VITALS2 @ General: No acute distress.  Awake. Head:  Normocephalic.  Atraumatic. ENT:  EOMI.  Mucous membranes moist Neck:  Supple.  No lymphadenopathy. CV:  S1 present. S2 present. Regular rate. Pulmonary: Equal effort bilaterally.  Clear to auscultation bilaterally. Abdomen: Soft.  Nontender to palpation. Skin:  Normal turgor.  No visible rash. Extremity: No gross deformity of bilateral upper extremities.  No gross deformity of    bilateral lower extremities. Neurologic: Alert. Appropriate mood.   Studies:  No results found for this basename: HGB, WBC, PLT,  in the last 72 hours  No results found for this basename: NA, K, CL, CO2, BUN, CREATININE, CALCIUM, MAGNESIUM, GFRNONAA, GFRAA,  in the last 72 hours   No results found for this basename: PT, INR, APTT,  in the last 72 hours   No components found with this basename: ABG,     Assessment:  Recurrent urothelial carcinoma  Plan: TUR-BT

## 2013-09-30 NOTE — Progress Notes (Signed)
Irrigated foley catheter with 300 ml normal saline, urine pink after irrigation with no clots.

## 2013-09-30 NOTE — Anesthesia Procedure Notes (Signed)
Procedure Name: LMA Insertion Date/Time: 09/30/2013 8:14 AM Performed by: Renella Cunas D Pre-anesthesia Checklist: Patient identified, Emergency Drugs available, Suction available and Patient being monitored Patient Re-evaluated:Patient Re-evaluated prior to inductionOxygen Delivery Method: Circle System Utilized Preoxygenation: Pre-oxygenation with 100% oxygen Intubation Type: IV induction Ventilation: Mask ventilation without difficulty LMA: LMA inserted LMA Size: 4.0 Number of attempts: 1 Airway Equipment and Method: bite block Placement Confirmation: positive ETCO2 Tube secured with: Tape Dental Injury: Teeth and Oropharynx as per pre-operative assessment

## 2013-09-30 NOTE — Transfer of Care (Signed)
Immediate Anesthesia Transfer of Care Note  Patient: Timothy [name]  Procedure(s) Performed: Procedure(s) (LRB): TRANSURETHRAL RESECTION OF BLADDER TUMOR (TURBT) (N/A)  Patient Location: PACU  Anesthesia Type: General  Level of Consciousness: awake, oriented, sedated and patient cooperative  Airway & Oxygen Therapy: Patient Spontanous Breathing and Patient connected to face mask oxygen  Post-op Assessment: Report given to PACU RN and Post -op Vital signs reviewed and stable  Post vital signs: Reviewed and stable  Complications: No apparent anesthesia complications

## 2013-09-30 NOTE — Op Note (Signed)
Preoperative diagnosis: Recurrent urothelial carcinoma the bladder  Postoperative diagnosis: Same   Procedure: Cystoscopy, TURBT 4 cm aggregate tumor volume    Surgeon: Bertram Millard. Jennefer Kopp, M.D.   Anesthesia: Gen.   Complications: None  Specimen(s): 1. Anterior bladder biopsy 2. Posterior bladder biopsy 3. Bladder neck biopsy  Drain(s): 20 French Foley catheter, 5 cc balloon to leg bag  Indications:67 year old presents for TUR-BT for a recurrence of bladder cancer. His history iis as follows:  I have been following him for elevation of his PSA. In March, 2014 was noted to have hematuria, cystoscopy revealed a left-sided papillary tumor, this was resected by TURBT on 03/14/2013. Mitomycin was placed. He had persistent dysuria postoperatively.  He experienced significant dysuria following the procedure for an extended period of time. At his first follow-up cystoscopy on 06/11/2013, several erythematous areas were noted. Cytology and FISH were atypical but were nondiagnostic for cancer.  He takes oxybutynin for some discomfort with urination. He has intermittent dark urine but has not seen any grossly bloody urine. PSA was recently checked on 09/02/2013 and returns 6.40, free to total ratio was 40%.  Recent office cystoscopy on 09/10/2013 revealed a recurrence of papillary urothelial carcinoma. He presents now for TUR-BT. Because of his severe urinary symptomatology after his 1st mitomycin, it will not be administered today after his procedure. He is aware of risks and complications of this procedure which include but are not limited to bleeding, infection, bladder perforation, among others. He understands and desires to proceed.     Technique and findings: The patient was properly identified in the holding area. He received 2 g of Ancef preoperatively intravenously. He was taken the operating room where general anesthetic was administered with the LMA. He was placed in the dorsolithotomy  position. Genitalia and perineum were prepped and draped, time out was performed.  I passed a 22 [place] panendoscope through the urethra with the Foroblique lens. There were no urethral lesions. The bladder neck, mainly when viewed with the 70 let once, there were lesions, papillary in nature, from the 10:00 to the 2:00 position anteriorly. The bladder was inspected. An inflammatory lesion was noted anteriorly on the bladder just to the right of the midline, approximately 1 cm in diameter. There were 3 papillary lesions on the left posterior wall, the largest of which was trapped 1/2 cm. They were lined up from caudad to craniad. No other lesions were noted. Ureteral orifices were normal in configuration and location. I used the cold cup biopsy forceps to remove the lesion anteriorly on the bladder wall. This was labeled anterior bladder lesion. Separately, all 3 papillary tumors were removed with the cold cup forceps and sent as "posterior bladder biopsy". I then removed the cystoscope and placed the resectoscope with the cutting loop. The previously mentioned biopsy sites were cauterized and were hemostatic. I then used the cutting loop to resect the papillary lesions of the bladder neck, from the 10 to the 2:00 position anteriorly. These were sent as bladder neck biopsies. This area was carefully controlled with electrocautery. For a while to achieve hemostasis, although the amount of bleeding was quite minimal. The bladder was reinspected, all biopsy sites were hemostatic. I then removed the resectoscope and placed a cystoscope. Using the 70 lens, I inspected the bladder neck. No further lesions were noted. I then placed a 20 French Foley catheter after removing the cystoscope and filled the balloon with 10 cc water. This was hooked to dependent drainage.  The patient  tolerated the procedure well. He was awakened and taken to the PACU in stable condition. He will be instructed on how to remove his catheter  in the morning. He was sent home on hydrocodone and cephalexin.

## 2013-09-30 NOTE — Anesthesia Preprocedure Evaluation (Addendum)
Anesthesia Evaluation  Patient identified by MRN, date of birth, ID band Patient awake    Reviewed: Allergy & Precautions, H&P , NPO status , Patient's Chart, lab work & pertinent test results  Airway Mallampati: II TM Distance: >3 FB Neck ROM: Full    Dental   Pulmonary neg pulmonary ROS,  breath sounds clear to auscultation  Pulmonary exam normal       Cardiovascular hypertension, Pt. on medications and Pt. on home beta blockers Rhythm:Regular Rate:Normal     Neuro/Psych negative neurological ROS  negative psych ROS   GI/Hepatic negative GI ROS, Neg liver ROS,   Endo/Other  diabetes, Type 2Obese  Renal/GU negative Renal ROS  negative genitourinary   Musculoskeletal negative musculoskeletal ROS (+)   Abdominal   Peds negative pediatric ROS (+)  Hematology negative hematology ROS (+)   Anesthesia Other Findings   Reproductive/Obstetrics negative OB ROS                         Anesthesia Physical Anesthesia Plan  ASA: III  Anesthesia Plan: General   Post-op Pain Management:    Induction: Intravenous  Airway Management Planned: LMA  Additional Equipment:   Intra-op Plan:   Post-operative Plan:   Informed Consent: I have reviewed the patients History and Physical, chart, labs and discussed the procedure including the risks, benefits and alternatives for the proposed anesthesia with the patient or authorized representative who has indicated his/her understanding and acceptance.   Dental advisory given  Plan Discussed with: CRNA and Surgeon  Anesthesia Plan Comments:         Anesthesia Quick Evaluation

## 2013-09-30 NOTE — Anesthesia Postprocedure Evaluation (Signed)
  Anesthesia Post-op Note  Patient: Timothy [name]  Procedure(s) Performed: Procedure(s) (LRB): TRANSURETHRAL RESECTION OF BLADDER TUMOR (TURBT) (N/A)  Patient Location: PACU  Anesthesia Type: General  Level of Consciousness: awake and alert   Airway and Oxygen Therapy: Patient Spontanous Breathing  Post-op Pain: mild  Post-op Assessment: Post-op Vital signs reviewed, Patient's Cardiovascular Status Stable, Respiratory Function Stable, Patent Airway and No signs of Nausea or vomiting  Last Vitals:  Filed Vitals:   09/30/13 0920  BP:   Pulse:   Temp:   Resp: 14    Post-op Vital Signs: stable   Complications: No apparent anesthesia complications

## 2013-09-30 NOTE — Progress Notes (Signed)
Dr. Retta Diones into see.

## 2013-10-01 ENCOUNTER — Encounter (HOSPITAL_BASED_OUTPATIENT_CLINIC_OR_DEPARTMENT_OTHER): Payer: Self-pay | Admitting: Urology

## 2013-10-08 ENCOUNTER — Ambulatory Visit (INDEPENDENT_AMBULATORY_CARE_PROVIDER_SITE_OTHER): Payer: Medicare Other | Admitting: Urology

## 2013-10-08 DIAGNOSIS — C679 Malignant neoplasm of bladder, unspecified: Secondary | ICD-10-CM

## 2013-10-22 ENCOUNTER — Ambulatory Visit (INDEPENDENT_AMBULATORY_CARE_PROVIDER_SITE_OTHER): Payer: Medicare Other | Admitting: Urology

## 2013-10-22 DIAGNOSIS — C679 Malignant neoplasm of bladder, unspecified: Secondary | ICD-10-CM

## 2013-10-22 DIAGNOSIS — R82998 Other abnormal findings in urine: Secondary | ICD-10-CM

## 2013-11-05 ENCOUNTER — Encounter (INDEPENDENT_AMBULATORY_CARE_PROVIDER_SITE_OTHER): Payer: Self-pay

## 2013-11-05 ENCOUNTER — Ambulatory Visit (INDEPENDENT_AMBULATORY_CARE_PROVIDER_SITE_OTHER): Payer: Medicare Other | Admitting: Urology

## 2013-11-05 DIAGNOSIS — C679 Malignant neoplasm of bladder, unspecified: Secondary | ICD-10-CM

## 2013-11-12 ENCOUNTER — Encounter (INDEPENDENT_AMBULATORY_CARE_PROVIDER_SITE_OTHER): Payer: Self-pay

## 2013-11-12 ENCOUNTER — Ambulatory Visit (INDEPENDENT_AMBULATORY_CARE_PROVIDER_SITE_OTHER): Payer: Medicare Other | Admitting: Urology

## 2013-11-12 DIAGNOSIS — Z8547 Personal history of malignant neoplasm of testis: Secondary | ICD-10-CM

## 2013-11-19 ENCOUNTER — Ambulatory Visit (INDEPENDENT_AMBULATORY_CARE_PROVIDER_SITE_OTHER): Payer: Medicare Other | Admitting: Urology

## 2013-11-19 DIAGNOSIS — C679 Malignant neoplasm of bladder, unspecified: Secondary | ICD-10-CM

## 2013-11-26 ENCOUNTER — Ambulatory Visit (INDEPENDENT_AMBULATORY_CARE_PROVIDER_SITE_OTHER): Payer: Medicare Other | Admitting: Urology

## 2013-11-26 DIAGNOSIS — C679 Malignant neoplasm of bladder, unspecified: Secondary | ICD-10-CM

## 2013-12-05 DIAGNOSIS — I214 Non-ST elevation (NSTEMI) myocardial infarction: Secondary | ICD-10-CM

## 2013-12-05 HISTORY — DX: Non-ST elevation (NSTEMI) myocardial infarction: I21.4

## 2013-12-10 ENCOUNTER — Ambulatory Visit: Payer: Medicare Other | Admitting: Urology

## 2013-12-19 ENCOUNTER — Encounter (INDEPENDENT_AMBULATORY_CARE_PROVIDER_SITE_OTHER): Payer: Medicare HMO | Admitting: Ophthalmology

## 2013-12-19 DIAGNOSIS — H35039 Hypertensive retinopathy, unspecified eye: Secondary | ICD-10-CM

## 2013-12-19 DIAGNOSIS — E1165 Type 2 diabetes mellitus with hyperglycemia: Secondary | ICD-10-CM

## 2013-12-19 DIAGNOSIS — E11319 Type 2 diabetes mellitus with unspecified diabetic retinopathy without macular edema: Secondary | ICD-10-CM

## 2013-12-19 DIAGNOSIS — I10 Essential (primary) hypertension: Secondary | ICD-10-CM

## 2013-12-19 DIAGNOSIS — E1139 Type 2 diabetes mellitus with other diabetic ophthalmic complication: Secondary | ICD-10-CM

## 2013-12-19 DIAGNOSIS — H2510 Age-related nuclear cataract, unspecified eye: Secondary | ICD-10-CM

## 2013-12-19 DIAGNOSIS — H33309 Unspecified retinal break, unspecified eye: Secondary | ICD-10-CM

## 2013-12-19 DIAGNOSIS — H43819 Vitreous degeneration, unspecified eye: Secondary | ICD-10-CM

## 2013-12-26 ENCOUNTER — Ambulatory Visit (INDEPENDENT_AMBULATORY_CARE_PROVIDER_SITE_OTHER): Payer: Medicare HMO | Admitting: Ophthalmology

## 2013-12-26 DIAGNOSIS — H33309 Unspecified retinal break, unspecified eye: Secondary | ICD-10-CM

## 2014-01-09 ENCOUNTER — Ambulatory Visit (INDEPENDENT_AMBULATORY_CARE_PROVIDER_SITE_OTHER): Payer: Medicare HMO | Admitting: Ophthalmology

## 2014-01-09 DIAGNOSIS — H33309 Unspecified retinal break, unspecified eye: Secondary | ICD-10-CM

## 2014-03-18 ENCOUNTER — Ambulatory Visit (INDEPENDENT_AMBULATORY_CARE_PROVIDER_SITE_OTHER): Payer: Medicare HMO | Admitting: Urology

## 2014-03-18 DIAGNOSIS — R972 Elevated prostate specific antigen [PSA]: Secondary | ICD-10-CM

## 2014-03-18 DIAGNOSIS — C679 Malignant neoplasm of bladder, unspecified: Secondary | ICD-10-CM

## 2014-03-27 ENCOUNTER — Other Ambulatory Visit: Payer: Self-pay | Admitting: Urology

## 2014-03-28 ENCOUNTER — Encounter (HOSPITAL_BASED_OUTPATIENT_CLINIC_OR_DEPARTMENT_OTHER): Payer: Self-pay | Admitting: *Deleted

## 2014-03-28 NOTE — Progress Notes (Signed)
NPO AFTER MN. ARRIVE AT 0830.  NEEDS ISTAT 8  AND EKG. WILL DO FLEET ENEMA AM DOS .

## 2014-03-31 ENCOUNTER — Encounter (HOSPITAL_BASED_OUTPATIENT_CLINIC_OR_DEPARTMENT_OTHER)
Admission: RE | Disposition: A | Discharge status: Home / Self Care | Payer: Self-pay | Source: Ambulatory Visit | Attending: Urology

## 2014-03-31 ENCOUNTER — Ambulatory Visit (HOSPITAL_BASED_OUTPATIENT_CLINIC_OR_DEPARTMENT_OTHER): Payer: Medicare HMO | Admitting: Anesthesiology

## 2014-03-31 ENCOUNTER — Encounter (HOSPITAL_BASED_OUTPATIENT_CLINIC_OR_DEPARTMENT_OTHER): Payer: Medicare HMO | Admitting: Anesthesiology

## 2014-03-31 ENCOUNTER — Ambulatory Visit (HOSPITAL_BASED_OUTPATIENT_CLINIC_OR_DEPARTMENT_OTHER)
Admission: RE | Admit: 2014-03-31 | Discharge: 2014-03-31 | Disposition: A | Discharge status: Home / Self Care | Payer: Medicare HMO | Source: Ambulatory Visit | Attending: Urology | Admitting: Urology

## 2014-03-31 ENCOUNTER — Encounter (HOSPITAL_BASED_OUTPATIENT_CLINIC_OR_DEPARTMENT_OTHER): Payer: Self-pay | Admitting: Anesthesiology

## 2014-03-31 DIAGNOSIS — E119 Type 2 diabetes mellitus without complications: Secondary | ICD-10-CM | POA: Insufficient documentation

## 2014-03-31 DIAGNOSIS — I10 Essential (primary) hypertension: Secondary | ICD-10-CM | POA: Insufficient documentation

## 2014-03-31 DIAGNOSIS — Z79899 Other long term (current) drug therapy: Secondary | ICD-10-CM | POA: Insufficient documentation

## 2014-03-31 DIAGNOSIS — C679 Malignant neoplasm of bladder, unspecified: Secondary | ICD-10-CM

## 2014-03-31 DIAGNOSIS — R972 Elevated prostate specific antigen [PSA]: Secondary | ICD-10-CM | POA: Insufficient documentation

## 2014-03-31 DIAGNOSIS — Z87891 Personal history of nicotine dependence: Secondary | ICD-10-CM | POA: Insufficient documentation

## 2014-03-31 HISTORY — PX: PROSTATE BIOPSY: SHX241

## 2014-03-31 HISTORY — PX: TRANSURETHRAL RESECTION OF BLADDER TUMOR: SHX2575

## 2014-03-31 HISTORY — DX: Elevated prostate specific antigen (PSA): R97.20

## 2014-03-31 LAB — GLUCOSE, CAPILLARY: GLUCOSE-CAPILLARY: 147 mg/dL — AB (ref 70–99)

## 2014-03-31 LAB — POCT I-STAT, CHEM 8
BUN: 18 mg/dL (ref 6–23)
CREATININE: 1.4 mg/dL — AB (ref 0.50–1.35)
Calcium, Ion: 1.16 mmol/L (ref 1.13–1.30)
Chloride: 105 mEq/L (ref 96–112)
Glucose, Bld: 157 mg/dL — ABNORMAL HIGH (ref 70–99)
HEMATOCRIT: 50 % (ref 39.0–52.0)
Hemoglobin: 17 g/dL (ref 13.0–17.0)
POTASSIUM: 3.6 meq/L — AB (ref 3.7–5.3)
SODIUM: 142 meq/L (ref 137–147)
TCO2: 24 mmol/L (ref 0–100)

## 2014-03-31 SURGERY — TURBT (TRANSURETHRAL RESECTION OF BLADDER TUMOR)
Anesthesia: General | Site: Prostate

## 2014-03-31 MED ORDER — MIDAZOLAM HCL 2 MG/2ML IJ SOLN
INTRAMUSCULAR | Status: AC
  Filled 2014-03-31: qty 2

## 2014-03-31 MED ORDER — SODIUM CHLORIDE 0.9 % IR SOLN
Status: DC | PRN
  Administered 2014-03-31: 2000 mL via INTRAVESICAL

## 2014-03-31 MED ORDER — SODIUM CHLORIDE 0.9 % IJ SOLN
3.0000 mL | Freq: Two times a day (BID) | INTRAMUSCULAR | Status: DC
  Filled 2014-03-31: qty 3

## 2014-03-31 MED ORDER — FENTANYL CITRATE 0.05 MG/ML IJ SOLN
INTRAMUSCULAR | Status: AC
  Filled 2014-03-31: qty 4

## 2014-03-31 MED ORDER — ACETAMINOPHEN 325 MG PO TABS
650.0000 mg | ORAL_TABLET | ORAL | Status: DC | PRN
  Filled 2014-03-31: qty 2

## 2014-03-31 MED ORDER — GENTAMICIN SULFATE 40 MG/ML IJ SOLN
160.0000 mg | INTRAVENOUS | Status: DC
  Filled 2014-03-31: qty 4

## 2014-03-31 MED ORDER — PROMETHAZINE HCL 25 MG/ML IJ SOLN
6.2500 mg | INTRAMUSCULAR | Status: DC | PRN
  Filled 2014-03-31: qty 1

## 2014-03-31 MED ORDER — ONDANSETRON HCL 4 MG/2ML IJ SOLN
INTRAMUSCULAR | Status: DC | PRN
  Administered 2014-03-31: 4 mg via INTRAVENOUS

## 2014-03-31 MED ORDER — HYDROCODONE-ACETAMINOPHEN 5-325 MG PO TABS: 1.0000 | ORAL_TABLET | ORAL | Status: DC | PRN

## 2014-03-31 MED ORDER — SODIUM CHLORIDE 0.9 % IJ SOLN
3.0000 mL | INTRAMUSCULAR | Status: DC | PRN
  Filled 2014-03-31: qty 3

## 2014-03-31 MED ORDER — GENTAMICIN SULFATE 40 MG/ML IJ SOLN
487.5000 mg | INTRAVENOUS | Status: DC | PRN
  Administered 2014-03-31: 400 mg via INTRAVENOUS

## 2014-03-31 MED ORDER — FENTANYL CITRATE 0.05 MG/ML IJ SOLN
25.0000 ug | INTRAMUSCULAR | Status: DC | PRN
  Filled 2014-03-31: qty 1

## 2014-03-31 MED ORDER — FENTANYL CITRATE 0.05 MG/ML IJ SOLN
INTRAMUSCULAR | Status: DC | PRN
  Administered 2014-03-31: 25 ug via INTRAVENOUS
  Administered 2014-03-31: 50 ug via INTRAVENOUS
  Administered 2014-03-31: 25 ug via INTRAVENOUS

## 2014-03-31 MED ORDER — ACETAMINOPHEN 650 MG RE SUPP
650.0000 mg | RECTAL | Status: DC | PRN
  Filled 2014-03-31: qty 1

## 2014-03-31 MED ORDER — KETOROLAC TROMETHAMINE 30 MG/ML IJ SOLN
15.0000 mg | Freq: Once | INTRAMUSCULAR | Status: DC | PRN
  Filled 2014-03-31: qty 1

## 2014-03-31 MED ORDER — SODIUM CHLORIDE 0.9 % IV SOLN
250.0000 mL | INTRAVENOUS | Status: DC | PRN
  Filled 2014-03-31: qty 250

## 2014-03-31 MED ORDER — PROPOFOL 10 MG/ML IV BOLUS
INTRAVENOUS | Status: DC | PRN
Start: 2014-03-31 — End: 2014-03-31
  Administered 2014-03-31: 200 mg via INTRAVENOUS

## 2014-03-31 MED ORDER — MIDAZOLAM HCL 5 MG/5ML IJ SOLN
INTRAMUSCULAR | Status: DC | PRN
  Administered 2014-03-31: 2 mg via INTRAVENOUS

## 2014-03-31 MED ORDER — LIDOCAINE HCL 2 % IJ SOLN
INTRAMUSCULAR | Status: DC | PRN
  Administered 2014-03-31: 10 mL

## 2014-03-31 MED ORDER — OXYCODONE HCL 5 MG PO TABS
5.0000 mg | ORAL_TABLET | ORAL | Status: DC | PRN
  Filled 2014-03-31: qty 2

## 2014-03-31 MED ORDER — HYDROCODONE-ACETAMINOPHEN 5-325 MG PO TABS
1.0000 | ORAL_TABLET | ORAL | Status: DC | PRN
  Administered 2014-03-31: 1 via ORAL
  Filled 2014-03-31: qty 1

## 2014-03-31 MED ORDER — LACTATED RINGERS IV SOLN
INTRAVENOUS | Status: DC
  Administered 2014-03-31 (×2): via INTRAVENOUS
  Filled 2014-03-31: qty 1000

## 2014-03-31 MED ORDER — HYDROCODONE-ACETAMINOPHEN 5-325 MG PO TABS
ORAL_TABLET | ORAL | Status: AC
  Filled 2014-03-31: qty 1

## 2014-03-31 MED ORDER — DEXAMETHASONE SODIUM PHOSPHATE 4 MG/ML IJ SOLN
INTRAMUSCULAR | Status: DC | PRN
  Administered 2014-03-31: 8 mg via INTRAVENOUS

## 2014-03-31 MED ORDER — LIDOCAINE HCL (CARDIAC) 20 MG/ML IV SOLN
INTRAVENOUS | Status: DC | PRN
  Administered 2014-03-31: 60 mg via INTRAVENOUS

## 2014-03-31 MED ORDER — ACETAMINOPHEN 10 MG/ML IV SOLN
INTRAVENOUS | Status: DC | PRN
  Administered 2014-03-31: 1000 mg via INTRAVENOUS

## 2014-03-31 SURGICAL SUPPLY — 35 items
BAG DRAIN URO-CYSTO SKYTR STRL (DRAIN) ×3 IMPLANT
BAG DRN ANRFLXCHMBR STRAP LEK (BAG) ×2
BAG DRN UROCATH (DRAIN) ×2
BAG URINE DRAINAGE (UROLOGICAL SUPPLIES) IMPLANT
BAG URINE LEG 19OZ MD ST LTX (BAG) ×1 IMPLANT
CANISTER SUCT LVC 12 LTR MEDI- (MISCELLANEOUS) ×1 IMPLANT
CATH FOLEY 2WAY SLVR  5CC 20FR (CATHETERS) ×1
CATH FOLEY 2WAY SLVR  5CC 22FR (CATHETERS)
CATH FOLEY 2WAY SLVR 5CC 20FR (CATHETERS) IMPLANT
CATH FOLEY 2WAY SLVR 5CC 22FR (CATHETERS) IMPLANT
CLOTH BEACON ORANGE TIMEOUT ST (SAFETY) ×3 IMPLANT
DRAPE CAMERA CLOSED 9X96 (DRAPES) ×3 IMPLANT
ELECT BUTTON BIOP 24F 90D PLAS (MISCELLANEOUS) IMPLANT
ELECT LOOP HF 26F 30D .35MM (CUTTING LOOP) IMPLANT
ELECT LOOP MED HF 24F 12D CBL (CLIP) ×1 IMPLANT
ELECT NEEDLE 45D HF 24-28F 12D (CUTTING LOOP) IMPLANT
ELECT REM PT RETURN 9FT ADLT (ELECTROSURGICAL) ×3
ELECTRODE REM PT RTRN 9FT ADLT (ELECTROSURGICAL) ×2 IMPLANT
EVACUATOR MICROVAS BLADDER (UROLOGICAL SUPPLIES) IMPLANT
GLOVE BIO SURGEON STRL SZ 6 (GLOVE) ×1 IMPLANT
GLOVE BIO SURGEON STRL SZ8 (GLOVE) ×3 IMPLANT
GLOVE INDICATOR 6.5 STRL GRN (GLOVE) ×2 IMPLANT
GOWN STRL REIN XL XLG (GOWN DISPOSABLE) ×3 IMPLANT
GOWN STRL REUS W/ TWL LRG LVL3 (GOWN DISPOSABLE) IMPLANT
GOWN STRL REUS W/TWL LRG LVL3 (GOWN DISPOSABLE) ×3
GOWN STRL REUS W/TWL XL LVL3 (GOWN DISPOSABLE) ×1 IMPLANT
GOWN XL W/COTTON TOWEL STD (GOWNS) ×2 IMPLANT
HOLDER FOLEY CATH W/STRAP (MISCELLANEOUS) IMPLANT
LOOP CUTTING 24FR OLYMPUS (CUTTING LOOP) IMPLANT
PACK CYSTOSCOPY (CUSTOM PROCEDURE TRAY) ×3 IMPLANT
PLUG CATH AND CAP STER (CATHETERS) IMPLANT
SET ASPIRATION TUBING (TUBING) IMPLANT
SYRINGE IRR TOOMEY STRL 70CC (SYRINGE) ×1 IMPLANT
TOWEL OR 17X24 6PK STRL BLUE (TOWEL DISPOSABLE) ×3 IMPLANT
UNDERPAD 30X30 INCONTINENT (UNDERPADS AND DIAPERS) ×3 IMPLANT

## 2014-03-31 NOTE — H&P (Signed)
Urology History and Physical Exam  QV:ZDGLOVF cancer, elevated PSA  HPI: 68 year old male presents for cystoscopy, repeat TURBT and prostate biopsy.   He has a history of urothelial carcinoma of the bladder.  He initially underwent TURB on 03/14/2013.  He had papillary, low-grade, noninvasive urothelial carcinoma.  He eventually was found to have recurrence within 6 months, and underwent repeat TURBT on 09/30/2013.  Again, this was low-grade, noninvasive.  Because of his recurrence in a short period of time, despite using mitomycin after his first and second treatments, he underwent BCG induction, which was completed in January, 2014.  He has had dysuria, frequency and urgency but no gross hematuria.  He recently completed BCG induction. Cystoscopy revealed recurrent bladder tumors.  He does have history of elevated PSA.  PSA recently, with him being symptomatic after mitomycin and BCG, is high at 9.13  PMH: Past Medical History  Diagnosis Date  . Diabetes mellitus, type 2   . Hypertension   . Bladder cancer   . Elevated PSA     PSH: Past Surgical History  Procedure Laterality Date  . Transurethral resection of bladder tumor N/A 03/14/2013    Procedure: TRANSURETHRAL RESECTION OF BLADDER TUMOR (TURBT);  Surgeon: [name], MD;  Location: [facility];  Service: Urology;  Laterality: N/A;  1 HR WITH MITOMYCIN INSTILLATION   . Transurethral resection of bladder tumor N/A 09/30/2013    Procedure: TRANSURETHRAL RESECTION OF BLADDER TUMOR (TURBT);  Surgeon: [name], MD;  Location: [facility];  Service: Urology;  Laterality: N/A;    Allergies: No Known Allergies  Medications: Prescriptions prior to admission  Medication Sig Dispense Refill  . bisoprolol-hydrochlorothiazide (ZIAC) 5-6.25 MG per tablet Take 1 tablet by mouth daily.      . metFORMIN (GLUCOPHAGE) 500 MG tablet Take 500 mg by mouth 2 (two) times daily with a meal.          Social History: History   Social History  . Marital Status: Married    Spouse Name: N/A    Number of Children: N/A  . Years of Education: N/A   Occupational History  . Not on file.   Social History Main Topics  . Smoking status: Former Smoker -- 1.00 packs/day for 30 years    Types: Cigarettes    Quit date: 03/11/1993  . Smokeless tobacco: Never Used  . Alcohol Use: No  . Drug Use: No  . Sexual Activity: Not on file   Other Topics Concern  . Not on file   Social History Narrative  . No narrative on file    Family History: History reviewed. No pertinent family history.  Review of Systems:  Genitourinary: urinary frequency, feelings of urinary urgency and dysuria, but no hematuria. Marland Kitchen                  Physical Exam: @VITALS2 @ General: No acute distress.  Awake. Head:  Normocephalic.  Atraumatic. ENT:  EOMI.  Mucous membranes moist Neck:  Supple.  No lymphadenopathy. CV:  S1 present. S2 present. Regular rate. Pulmonary: Equal effort bilaterally.  Clear to auscultation bilaterally. Abdomen: Soft.  Non tender to palpation. Skin:  Normal turgor.  No visible rash. Extremity: No gross deformity of bilateral upper extremities.  No gross deformity of                             lower extremities. Rectal: Rectal exam demonstrates normal sphincter  tone, the anus is normal on inspection., no tenderness and no masses. Prostate size is estimated to be 60 g. Normal rectal tone, no rectal masses, prostate is smooth, symmetric and non-tender. The prostate has no nodularity and is not tender. The left seminal vesicle is nonpalpable. The right seminal vesicle is nonpalpable. The perineum is normal on inspection.  Genitourinary: Examination of the penis demonstrates no discharge, no masses, no lesions and a normal meatus. The penis is uncircumcised. The scrotum is normal in appearance and without lesions. Examination of the right scrotum demonstrates a hydrocele. The right epididymis  is palpably normal and non-tender. The left epididymis is palpably normal and non-tender. The right testis is palpably normal, non-tender and without masses. The left testis is normal, non-tender and without masses.  Skin: Normal skin turgor, no visible rash and no visible skin lesions.  Neuro/Psych:. Mood and affect are appropriate.      Studies:  No results found for this basename: HGB, WBC, PLT,  in the last 72 hours  No results found for this basename: NA, K, CL, CO2, BUN, CREATININE, CALCIUM, MAGNESIUM, GFRNONAA, GFRAA,  in the last 72 hours   No results found for this basename: PT, INR, APTT,  in the last 72 hours   No components found with this basename: ABG,     Assessment:      1.  Urothelial carcinoma of the bladder, recurrent.  First TURBT was in April, 2014, the second in August, 2014.  He now has evidence of recurrence, despite recent BCG induction and receiving mitomycin following his resections.  2.  Elevated PSA, recently 9.  This needs further evaluation.   Plan: Transrectal ultrasound and biopsy of the prostate, TURBT

## 2014-03-31 NOTE — Anesthesia Postprocedure Evaluation (Signed)
  Anesthesia Post-op Note  Patient: Timothy [name]  Procedure(s) Performed: Procedure(s) (LRB): TRANSURETHRAL RESECTION OF BLADDER TUMOR (TURBT) (N/A) BIOPSY TRANSRECTAL ULTRASONIC PROSTATE (TUBP) (N/A)  Patient Location: PACU  Anesthesia Type: General  Level of Consciousness: awake and alert   Airway and Oxygen Therapy: Patient Spontanous Breathing  Post-op Pain: mild  Post-op Assessment: Post-op Vital signs reviewed, Patient's Cardiovascular Status Stable, Respiratory Function Stable, Patent Airway and No signs of Nausea or vomiting  Last Vitals:  Filed Vitals:   03/31/14 0959  BP: 112/68  Pulse:   Temp: 36.5 C  Resp: 21    Post-op Vital Signs: stable   Complications: No apparent anesthesia complications

## 2014-03-31 NOTE — Op Note (Signed)
PATIENT:  Timothy [name]  PRE-OPERATIVE DIAGNOSIS: Elevated PSA, recurrent urothelial carcinoma the bladder  POST-OPERATIVE DIAGNOSIS: Same  PROCEDURE: Transrectal ultrasound and biopsy of the prostate, TURBT 2.5 cm bladder lesion  SURGEON:  [name]. Mackenzy Eisenberg, M.D.  ANESTHESIA:  General  EBL:  Minimal  DRAINS: None  LOCAL MEDICATIONS USED:  None  SPECIMEN:  1. Core biopsies of prostate x12 2. Bladder tumor 3. Base of bladder tumor  INDICATION: Timothy [name] is a 68 year old male who presents at this time for transrectal ultrasound and biopsy of his prostate. He has an elevating PSA. It was recently checked and was 9. He has no nodularity. Additionally, he has a history of urothelial carcinoma the bladder, and has completed induction BCG following his initial bladder tumor resection 2014. Recent cystoscopy revealed him to have a recurrent tumor/tumors in the posterior bladder in the midline, approximately 3 cm in size. Presents at this time for TURBT and transrectal ultrasound and biopsy of the prostate. He is aware of risks and complications of the procedure and has decided to proceed.  Description of procedure: The patient was properly identified and marked (if applicable) in the holding area. They were then  taken to the operating room and placed on the table in a supine position. General anesthesia was then administered. Once fully anesthetized the patient was moved to the dorsolithotomy position and the genitalia and perineum were sterilely prepped and draped in standard fashion. An official timeout was then performed.  The patient was positioned on the left lateral decubitus position. The transrectal ultrasound probe was placed. His prostate was imaged. Volume was 53.55 mL. Prostatic length was 4.85 cm, height 4.07 cm, with 5.18 cm. It is fairly uniform. No specific hyperechoic or hypoechoic areas were noted. Seminal vesicles were normal. I injected 5 cc of 2% plain lidocaine  just lateral to the base of the seminal vesicles bilaterally. At this point, 12 separate needle core biopsies were taken with the Biopty gun in the usual manner, 6 from the right, 6 from the left prostate, for these from the base, 4 from the mid and 4 from the apex of the prostate. These were sent separately. He tolerated this procedure well. At this point, the probe was removed from his rectum.  He was placed in the dorsolithotomy position. A 22 French panendoscope was passed through his urethra which was normal. There were no abnormalities within the prostatic urethra which was nonobstructive. There was somewhat of a high bladder neck. Bladder was inspected circumferentially. Ureteral orifices were normal in configuration and location. There were 2 separate bladder tumors just superior to the intraureteric ridge in the midline. These were approximately 1.5 cm each in size. They were just inferior to the prior site of resection. No other bladder lesions were seen. There was no significant trabeculation. No foreign bodies were noted.  I then passed the resectoscope sheath. The cutting loop was placed, and using the gyrus device, I resected the bladder tumors. These were sent together as "bladder tumors". I then resected the base of the bladder tumors, getting into the muscle at his bladder tumor site. He separate specimens were sent as "bladder tumor base". The loop was then used to cauterize the base of the resection site. This was hemostatic. I then removed the sheath, and passed a 20 French Foley catheter with 5 cc balloon. This was hooked to dependent drainage.  The patient tolerated procedure well. He was awakened and taken to the PACU in stable condition.  PLAN OF CARE: Discharge to home after PACU  PATIENT DISPOSITION:  PACU - hemodynamically stable.

## 2014-03-31 NOTE — Transfer of Care (Signed)
Immediate Anesthesia Transfer of Care Note  Patient: Eaven Schwager  Procedure(s) Performed: Procedure(s) (LRB): TRANSURETHRAL RESECTION OF BLADDER TUMOR (TURBT) (N/A) BIOPSY TRANSRECTAL ULTRASONIC PROSTATE (TUBP) (N/A)  Patient Location: PACU  Anesthesia Type: General  Level of Consciousness: awake, alert  and oriented  Airway & Oxygen Therapy: Patient Spontanous Breathing and Patient connected to face mask oxygen  Post-op Assessment: Report given to PACU RN and Post -op Vital signs reviewed and stable  Post vital signs: Reviewed and stable  Complications: No apparent anesthesia complications

## 2014-03-31 NOTE — Discharge Instructions (Signed)
Transurethral Resection of Bladder Tumor (TURBT)  Definition:  Transurethral Resection of the Bladder Tumor is a surgical procedure used to diagnose and remove tumors within the bladder. TURBT is the most common treatment for early stage bladder cancer.   General instructions:  Your recent bladder surgery requires very little post hospital care but some definite precautions.  Despite the fact that no skin incisions were used, the area around the tumor removal site is raw and covered with scabs to promote healing and prevent bleeding. Certain precautions are needed to insure that the scabs are not disturbed over the next 2-4 weeks while the healing proceeds.  Because the raw surface inside your bladder and the irritating effects of urine you may expect frequency of urination and/or urgency (a stronger desire to urinate) and perhaps even getting up at night more often. This will usually resolve or improve slowly over the healing period. You may see some blood in your urine over the first 6 weeks. Do not be alarmed, even if the urine was clear for a while. Get off your feet and drink lots of fluids until clearing occurs. If you start to pass clots or don't improve call us. Remove catheter as instructed on Tuesday morning.  Diet:  You may return to your normal diet immediately. Because of the raw surface of your bladder, alcohol, spicy foods, foods high in acid and drinks with caffeine may cause irritation or frequency and should be used in moderation. To keep your urine flowing freely and avoid constipation, drink plenty of fluids during the day (8-10 glasses). Tip: Avoid cranberry juice because it is very acidic.   Activity:  Your physical activity needs to be restricted somewhat.  We suggest that you reduce your activity under the circumstances until the bleeding has stopped. Heavy lifting (greater than 20 lbs.) and heavy exertion should be limited for 2-3 weeks.  Bowels:  It is important to keep your  bowels regular during the postoperative period. Straining with bowel movements can cause bleeding. A bowel movement every other day is reasonable. Use a mild laxative if needed, such as milk of magnesia 2-3 tablespoons, or 2 Dulcolax tablets. Call if you continue to have problems. If you had been taking narcotics for pain, before, during or after your surgery, you may be constipated.   Medication:  You should resume your pre-surgery medications unless told not to. In addition you may be given an antibiotic to prevent or treat infection. Antibiotics are not always necessary. All medication should be taken as prescribed until the bottles are finished unless you are having an unusual reaction to one of the drugs.  General Anesthetic, Adult  A doctor specialized in giving anesthesia (anesthesiologist) or a nurse specialized in giving anesthesia (nurse anesthetist) gives medicine that makes you sleep while a procedure is performed (general anesthetic). Once the general anesthetic has been administered, you will be in a sleeplike state in which you feel no pain. After having a general anesthetic you may feel:  Dizzy.  Weak.  Drowsy.  Confused.  These feelings are normal and can be expected to last for up to 24 hours after the procedure.   LET YOUR CAREGIVER KNOW ABOUT:  Allergies you have.  Medications you are taking, including herbs, eye drops, over the counter medications, dietary supplements, and creams.  Previous problems you have had with anesthetics or numbing medicines.  Use of cigarettes, alcohol, or illicit drugs.  Possibility of pregnancy, if this applies.  History of bleeding or blood  disorders, including blood clots and clotting disorders.  Previous surgeries you have had and types of anesthetics you have received.  Family medical history, especially anesthetic problems.  Other health problems.   AFTER THE PROCEDURE  After surgery, you will be taken to the recovery area where a nurse  will monitor your progress. You will be allowed to go home when you are awake, stable, taking fluids well, and without serious pain or complications.  For the first 24 hours following an anesthetic:  Have a responsible person with you.  Do not drive a car. If you are alone, do not take public transportation.  Do not engage in strenuous activity. You may usually resume normal activities the next day, or as advised by your caregiver.  Do not drink alcohol.  Do not take medicine that has not been prescribed by your caregiver.  Do not sign important papers or make important decisions as your judgement may be impaired.  You may resume a normal diet as directed.  Change bandages (dressings) as directed.  Only take over-the-counter or prescription medicines for pain, discomfort, or fever as directed by your caregiver.  If you have questions or problems that seem related to the anesthetic, call the hospital and ask for the anesthetist, anesthesiologist, or anesthesia department.   SEEK IMMEDIATE MEDICAL CARE IF:  You develop a rash.  You have difficulty breathing.  You have chest pain.  You have allergic problems.  You have uncontrolled nausea.  You have uncontrolled vomiting.  You develop any serious bleeding, especially from the incision site.  Document Released: 02/28/2008 Document Revised: 08/03/2011 Document Reviewed: 03/24/2011  [facility] Patient Information 2012 [place].   Indwelling Urinary Catheter Care You have been given a flexible tube (catheter) used to drain the bladder. Catheters are often used when a person has difficulty urinating due to blockage, bleeding, infection, or inability to control bladder or bowel movements (incontinence). A catheter requires daily care to prevent infection and blockage. HOME CARE INSTRUCTIONS  Do the following to reduce the risk of infection. Antibiotic medicines cannot prevent infections. Limit the number of bacteria entering your  bladder  Wash your hands for 2 minutes with soapy water before and after handling the catheter.  Wash your bottom and the entire catheter twice daily, as well as after each bowel movement. Wash the tip of the penis or just above the vaginal opening with soap and warm water, rinse, and then wash the rectal area. Always wash from front to back.  When changing from the leg bag to overnight bag or from the overnight bag to leg bag, thoroughly clean the end of the catheter where it connects to the tubing with an alcohol wipe.  Clean the leg bag and overnight bag daily after use. Replace your drainage bags weekly.  Always keep the tubing and bag below the level of your bladder. This allows your urine to drain properly. Lifting the bag or tubing above the level of your bladder will cause dirty urine to flow back into your bladder. If you must briefly lift the bag higher than your bladder, pinch the catheter or tubing to prevent backflow.  Drink enough water and fluids to keep your urine clear or pale yellow, or as directed by your caregiver. This will flush bacteria out of the bladder. Protect tissues from injury  Attach the catheter to your leg so there is no tension on the catheter. Use adhesive tape or a leg strap. If you are using adhesive tape, remove  any sticky residue left behind by the previous tape you used.  Place your leg bag on your lower leg. Fasten the straps securely and comfortably.  Do not remove the catheter yourself unless you have been instructed how to do so. Keep the urinary pathway open  Check throughout the day to be sure your catheter is working and urine is draining freely. Make sure the tubing does not become kinked.  Do not let the drainage bag overfill. SEEK IMMEDIATE MEDICAL CARE IF:   The catheter becomes blocked. Urine is not draining.  Urine is leaking.  You have any pain.  You have a fever. Document Released: 11/21/2005 Document Revised: 11/07/2012  Document Reviewed: 04/22/2010 [facility] Patient Information 2014 [place].   Post Anesthesia Home Care Instructions  Activity: Get plenty of rest for the remainder of the day. A responsible adult should stay with you for 24 hours following the procedure.  For the next 24 hours, DO NOT: -Drive a car -Paediatric nurse -Drink alcoholic beverages -Take any medication unless instructed by your physician -Make any legal decisions or sign important papers.  Meals: Start with liquid foods such as gelatin or soup. Progress to regular foods as tolerated. Avoid greasy, spicy, heavy foods. If nausea and/or vomiting occur, drink only clear liquids until the nausea and/or vomiting subsides. Call your physician if vomiting continues.  Special Instructions/Symptoms: Your throat may feel dry or sore from the anesthesia or the breathing tube placed in your throat during surgery. If this causes discomfort, gargle with warm salt water. The discomfort should disappear within 24 hours.

## 2014-03-31 NOTE — Anesthesia Preprocedure Evaluation (Signed)
Anesthesia Evaluation  Patient identified by MRN, date of birth, ID band Patient awake    Reviewed: Allergy & Precautions, H&P , NPO status , Patient's Chart, lab work & pertinent test results  Airway Mallampati: II TM Distance: >3 FB Neck ROM: Full    Dental no notable dental hx.    Pulmonary neg pulmonary ROS, former smoker,  breath sounds clear to auscultation  Pulmonary exam normal       Cardiovascular hypertension, Pt. on medications Rhythm:Regular Rate:Normal     Neuro/Psych negative neurological ROS  negative psych ROS   GI/Hepatic negative GI ROS, Neg liver ROS,   Endo/Other  diabetes, Oral Hypoglycemic Agents  Renal/GU negative Renal ROS  negative genitourinary   Musculoskeletal negative musculoskeletal ROS (+)   Abdominal   Peds negative pediatric ROS (+)  Hematology negative hematology ROS (+)   Anesthesia Other Findings   Reproductive/Obstetrics negative OB ROS                           Anesthesia Physical Anesthesia Plan  ASA: III  Anesthesia Plan: General   Post-op Pain Management:    Induction: Intravenous  Airway Management Planned: LMA  Additional Equipment:   Intra-op Plan:   Post-operative Plan: Extubation in OR  Informed Consent: I have reviewed the patients History and Physical, chart, labs and discussed the procedure including the risks, benefits and alternatives for the proposed anesthesia with the patient or authorized representative who has indicated his/her understanding and acceptance.   Dental advisory given  Plan Discussed with: CRNA and Surgeon  Anesthesia Plan Comments:         Anesthesia Quick Evaluation

## 2014-03-31 NOTE — Anesthesia Procedure Notes (Signed)
Procedure Name: LMA Insertion Date/Time: 03/31/2014 9:10 AM Performed by: Mechele Claude Pre-anesthesia Checklist: Patient identified, Emergency Drugs available, Suction available and Patient being monitored Patient Re-evaluated:Patient Re-evaluated prior to inductionOxygen Delivery Method: Circle System Utilized Preoxygenation: Pre-oxygenation with 100% oxygen Intubation Type: IV induction Ventilation: Mask ventilation without difficulty LMA: LMA inserted LMA Size: 4.0 Number of attempts: 1 Airway Equipment and Method: bite block Placement Confirmation: positive ETCO2 Tube secured with: Tape Dental Injury: Teeth and Oropharynx as per pre-operative assessment

## 2014-04-01 ENCOUNTER — Encounter (HOSPITAL_BASED_OUTPATIENT_CLINIC_OR_DEPARTMENT_OTHER): Payer: Self-pay | Admitting: Urology

## 2014-04-29 ENCOUNTER — Ambulatory Visit (INDEPENDENT_AMBULATORY_CARE_PROVIDER_SITE_OTHER): Payer: Medicare HMO | Admitting: Urology

## 2014-04-29 DIAGNOSIS — C679 Malignant neoplasm of bladder, unspecified: Secondary | ICD-10-CM

## 2014-04-29 DIAGNOSIS — C61 Malignant neoplasm of prostate: Secondary | ICD-10-CM

## 2014-04-29 DIAGNOSIS — N390 Urinary tract infection, site not specified: Secondary | ICD-10-CM

## 2014-05-09 ENCOUNTER — Ambulatory Visit (INDEPENDENT_AMBULATORY_CARE_PROVIDER_SITE_OTHER): Payer: Medicare HMO | Admitting: Ophthalmology

## 2014-05-09 DIAGNOSIS — E1165 Type 2 diabetes mellitus with hyperglycemia: Secondary | ICD-10-CM

## 2014-05-09 DIAGNOSIS — H35039 Hypertensive retinopathy, unspecified eye: Secondary | ICD-10-CM

## 2014-05-09 DIAGNOSIS — H2510 Age-related nuclear cataract, unspecified eye: Secondary | ICD-10-CM

## 2014-05-09 DIAGNOSIS — E1139 Type 2 diabetes mellitus with other diabetic ophthalmic complication: Secondary | ICD-10-CM

## 2014-05-09 DIAGNOSIS — I10 Essential (primary) hypertension: Secondary | ICD-10-CM

## 2014-05-09 DIAGNOSIS — E11319 Type 2 diabetes mellitus with unspecified diabetic retinopathy without macular edema: Secondary | ICD-10-CM

## 2014-05-09 DIAGNOSIS — H33309 Unspecified retinal break, unspecified eye: Secondary | ICD-10-CM

## 2014-05-09 DIAGNOSIS — H43819 Vitreous degeneration, unspecified eye: Secondary | ICD-10-CM

## 2014-05-13 ENCOUNTER — Ambulatory Visit (INDEPENDENT_AMBULATORY_CARE_PROVIDER_SITE_OTHER): Payer: Medicare HMO | Admitting: Urology

## 2014-05-13 DIAGNOSIS — C61 Malignant neoplasm of prostate: Secondary | ICD-10-CM

## 2014-05-13 DIAGNOSIS — C679 Malignant neoplasm of bladder, unspecified: Secondary | ICD-10-CM

## 2014-05-27 ENCOUNTER — Ambulatory Visit (INDEPENDENT_AMBULATORY_CARE_PROVIDER_SITE_OTHER): Payer: Medicare HMO | Admitting: Urology

## 2014-05-27 DIAGNOSIS — C679 Malignant neoplasm of bladder, unspecified: Secondary | ICD-10-CM

## 2014-05-27 DIAGNOSIS — C61 Malignant neoplasm of prostate: Secondary | ICD-10-CM

## 2014-06-03 ENCOUNTER — Ambulatory Visit (INDEPENDENT_AMBULATORY_CARE_PROVIDER_SITE_OTHER): Payer: Medicare HMO | Admitting: Urology

## 2014-06-03 DIAGNOSIS — C679 Malignant neoplasm of bladder, unspecified: Secondary | ICD-10-CM

## 2014-06-17 ENCOUNTER — Inpatient Hospital Stay (HOSPITAL_COMMUNITY)
Admission: EM | Admit: 2014-06-17 | Discharge: 2014-06-19 | DRG: 251 | Disposition: A | Discharge status: Home / Self Care | Payer: Medicare HMO | Attending: Cardiology | Admitting: Cardiology

## 2014-06-17 ENCOUNTER — Encounter (HOSPITAL_COMMUNITY): Payer: Self-pay | Admitting: Emergency Medicine

## 2014-06-17 ENCOUNTER — Encounter (HOSPITAL_COMMUNITY)
Admission: EM | Disposition: A | Discharge status: Home / Self Care | Payer: Medicare HMO | Source: Home / Self Care | Attending: Cardiology

## 2014-06-17 ENCOUNTER — Ambulatory Visit: Payer: Medicare HMO | Admitting: Urology

## 2014-06-17 DIAGNOSIS — I214 Non-ST elevation (NSTEMI) myocardial infarction: Principal | ICD-10-CM | POA: Diagnosis present

## 2014-06-17 DIAGNOSIS — Z9861 Coronary angioplasty status: Secondary | ICD-10-CM

## 2014-06-17 DIAGNOSIS — Z955 Presence of coronary angioplasty implant and graft: Secondary | ICD-10-CM

## 2014-06-17 DIAGNOSIS — Z8551 Personal history of malignant neoplasm of bladder: Secondary | ICD-10-CM | POA: Diagnosis not present

## 2014-06-17 DIAGNOSIS — I10 Essential (primary) hypertension: Secondary | ICD-10-CM | POA: Diagnosis present

## 2014-06-17 DIAGNOSIS — I2510 Atherosclerotic heart disease of native coronary artery without angina pectoris: Secondary | ICD-10-CM | POA: Diagnosis present

## 2014-06-17 DIAGNOSIS — Z6831 Body mass index (BMI) 31.0-31.9, adult: Secondary | ICD-10-CM | POA: Diagnosis not present

## 2014-06-17 DIAGNOSIS — C679 Malignant neoplasm of bladder, unspecified: Secondary | ICD-10-CM | POA: Diagnosis present

## 2014-06-17 DIAGNOSIS — I249 Acute ischemic heart disease, unspecified: Secondary | ICD-10-CM

## 2014-06-17 DIAGNOSIS — E669 Obesity, unspecified: Secondary | ICD-10-CM | POA: Diagnosis present

## 2014-06-17 DIAGNOSIS — E119 Type 2 diabetes mellitus without complications: Secondary | ICD-10-CM | POA: Diagnosis present

## 2014-06-17 DIAGNOSIS — R972 Elevated prostate specific antigen [PSA]: Secondary | ICD-10-CM | POA: Diagnosis present

## 2014-06-17 DIAGNOSIS — I2582 Chronic total occlusion of coronary artery: Secondary | ICD-10-CM | POA: Diagnosis present

## 2014-06-17 DIAGNOSIS — Z87891 Personal history of nicotine dependence: Secondary | ICD-10-CM

## 2014-06-17 DIAGNOSIS — I200 Unstable angina: Secondary | ICD-10-CM | POA: Diagnosis present

## 2014-06-17 DIAGNOSIS — I379 Nonrheumatic pulmonary valve disorder, unspecified: Secondary | ICD-10-CM

## 2014-06-17 HISTORY — DX: Atherosclerotic heart disease of native coronary artery without angina pectoris: I25.10

## 2014-06-17 HISTORY — PX: LEFT HEART CATHETERIZATION WITH CORONARY ANGIOGRAM: SHX5451

## 2014-06-17 LAB — GLUCOSE, CAPILLARY
GLUCOSE-CAPILLARY: 138 mg/dL — AB (ref 70–99)
Glucose-Capillary: 164 mg/dL — ABNORMAL HIGH (ref 70–99)
Glucose-Capillary: 188 mg/dL — ABNORMAL HIGH (ref 70–99)

## 2014-06-17 LAB — CBC WITH DIFFERENTIAL/PLATELET
Basophils Absolute: 0 10*3/uL (ref 0.0–0.1)
Basophils Relative: 0 % (ref 0–1)
EOS ABS: 0.1 10*3/uL (ref 0.0–0.7)
Eosinophils Relative: 1 % (ref 0–5)
HEMATOCRIT: 43 % (ref 39.0–52.0)
HEMOGLOBIN: 15 g/dL (ref 13.0–17.0)
LYMPHS ABS: 1.6 10*3/uL (ref 0.7–4.0)
Lymphocytes Relative: 17 % (ref 12–46)
MCH: 31.6 pg (ref 26.0–34.0)
MCHC: 34.9 g/dL (ref 30.0–36.0)
MCV: 90.5 fL (ref 78.0–100.0)
MONO ABS: 0.4 10*3/uL (ref 0.1–1.0)
MONOS PCT: 4 % (ref 3–12)
NEUTROS PCT: 78 % — AB (ref 43–77)
Neutro Abs: 7.3 10*3/uL (ref 1.7–7.7)
Platelets: 204 10*3/uL (ref 150–400)
RBC: 4.75 MIL/uL (ref 4.22–5.81)
RDW: 14.5 % (ref 11.5–15.5)
WBC: 9.3 10*3/uL (ref 4.0–10.5)

## 2014-06-17 LAB — POCT ACTIVATED CLOTTING TIME
Activated Clotting Time: 118 seconds
Activated Clotting Time: 168 seconds
Activated Clotting Time: 557 seconds

## 2014-06-17 LAB — COMPREHENSIVE METABOLIC PANEL
ALT: 20 U/L (ref 0–53)
ANION GAP: 13 (ref 5–15)
AST: 28 U/L (ref 0–37)
Albumin: 3.9 g/dL (ref 3.5–5.2)
Alkaline Phosphatase: 67 U/L (ref 39–117)
BUN: 14 mg/dL (ref 6–23)
CALCIUM: 8.9 mg/dL (ref 8.4–10.5)
CO2: 26 mEq/L (ref 19–32)
Chloride: 101 mEq/L (ref 96–112)
Creatinine, Ser: 1.21 mg/dL (ref 0.50–1.35)
GFR calc non Af Amer: 60 mL/min — ABNORMAL LOW (ref >90)
GFR, EST AFRICAN AMERICAN: 70 mL/min — AB (ref >90)
GLUCOSE: 178 mg/dL — AB (ref 70–99)
Potassium: 4 mEq/L (ref 3.7–5.3)
Sodium: 140 mEq/L (ref 137–147)
Total Bilirubin: 0.5 mg/dL (ref 0.3–1.2)
Total Protein: 7 g/dL (ref 6.0–8.3)

## 2014-06-17 LAB — MRSA PCR SCREENING: MRSA by PCR: NEGATIVE

## 2014-06-17 LAB — PROTIME-INR
INR: 0.99 (ref 0.00–1.49)
PROTHROMBIN TIME: 13.1 s (ref 11.6–15.2)

## 2014-06-17 LAB — TROPONIN I: Troponin I: 0.49 ng/mL (ref <0.30)

## 2014-06-17 LAB — LIPASE, BLOOD: Lipase: 47 U/L (ref 11–59)

## 2014-06-17 LAB — APTT: APTT: 29 s (ref 24–37)

## 2014-06-17 SURGERY — LEFT HEART CATHETERIZATION WITH CORONARY ANGIOGRAM
Anesthesia: LOCAL

## 2014-06-17 MED ORDER — BISOPROLOL-HYDROCHLOROTHIAZIDE 5-6.25 MG PO TABS
1.0000 | ORAL_TABLET | Freq: Every day | ORAL | Status: DC
  Administered 2014-06-17 – 2014-06-18 (×2): 1 via ORAL
  Filled 2014-06-17 (×2): qty 1

## 2014-06-17 MED ORDER — HEPARIN SODIUM (PORCINE) 1000 UNIT/ML IJ SOLN
INTRAMUSCULAR | Status: AC
  Filled 2014-06-17: qty 1

## 2014-06-17 MED ORDER — HEPARIN (PORCINE) IN NACL 100-0.45 UNIT/ML-% IJ SOLN
950.0000 [IU]/h | INTRAMUSCULAR | Status: DC
  Administered 2014-06-17: 950 [IU]/h via INTRAVENOUS
  Filled 2014-06-17: qty 250

## 2014-06-17 MED ORDER — GI COCKTAIL ~~LOC~~
30.0000 mL | Freq: Once | ORAL | Status: AC
  Administered 2014-06-17: 30 mL via ORAL
  Filled 2014-06-17: qty 30

## 2014-06-17 MED ORDER — ASPIRIN 81 MG PO CHEW
324.0000 mg | CHEWABLE_TABLET | Freq: Once | ORAL | Status: AC
  Administered 2014-06-17: 324 mg via ORAL
  Filled 2014-06-17: qty 4

## 2014-06-17 MED ORDER — MORPHINE SULFATE 2 MG/ML IJ SOLN: 2.0000 mg | INTRAMUSCULAR | Status: DC | PRN

## 2014-06-17 MED ORDER — HEPARIN (PORCINE) IN NACL 2-0.9 UNIT/ML-% IJ SOLN
INTRAMUSCULAR | Status: AC
  Filled 2014-06-17: qty 1500

## 2014-06-17 MED ORDER — ACETAMINOPHEN 325 MG PO TABS: 650.0000 mg | ORAL_TABLET | ORAL | Status: DC | PRN

## 2014-06-17 MED ORDER — SODIUM CHLORIDE 0.9 % IV SOLN: 250.0000 mL | INTRAVENOUS | Status: DC | PRN

## 2014-06-17 MED ORDER — MIDAZOLAM HCL 2 MG/2ML IJ SOLN
INTRAMUSCULAR | Status: AC
  Filled 2014-06-17: qty 2

## 2014-06-17 MED ORDER — PERFLUTREN LIPID MICROSPHERE
1.0000 mL | INTRAVENOUS | Status: AC | PRN
Start: 2014-06-17 — End: 2014-06-17
  Filled 2014-06-17: qty 10

## 2014-06-17 MED ORDER — LIDOCAINE HCL (PF) 1 % IJ SOLN
INTRAMUSCULAR | Status: AC
  Filled 2014-06-17: qty 30

## 2014-06-17 MED ORDER — HEPARIN BOLUS VIA INFUSION
4000.0000 [IU] | Freq: Once | INTRAVENOUS | Status: AC
  Administered 2014-06-17: 4000 [IU] via INTRAVENOUS

## 2014-06-17 MED ORDER — BIVALIRUDIN 250 MG IV SOLR
INTRAVENOUS | Status: AC
  Filled 2014-06-17: qty 250

## 2014-06-17 MED ORDER — SODIUM CHLORIDE 0.9 % IV SOLN
1.7500 mg/kg/h | INTRAVENOUS | Status: DC
  Filled 2014-06-17: qty 250

## 2014-06-17 MED ORDER — BISOPROLOL-HYDROCHLOROTHIAZIDE 5-6.25 MG PO TABS: 1.0000 | ORAL_TABLET | Freq: Every day | ORAL | Status: DC

## 2014-06-17 MED ORDER — NITROGLYCERIN 0.2 MG/ML ON CALL CATH LAB
INTRAVENOUS | Status: AC
  Filled 2014-06-17: qty 1

## 2014-06-17 MED ORDER — HEPARIN (PORCINE) IN NACL 100-0.45 UNIT/ML-% IJ SOLN
1250.0000 [IU]/h | INTRAMUSCULAR | Status: DC
  Administered 2014-06-17: 1000 [IU]/h via INTRAVENOUS
  Administered 2014-06-18: 1250 [IU]/h via INTRAVENOUS
  Filled 2014-06-17 (×4): qty 250

## 2014-06-17 MED ORDER — SODIUM CHLORIDE 0.9 % IJ SOLN: 3.0000 mL | INTRAMUSCULAR | Status: DC | PRN

## 2014-06-17 MED ORDER — ONDANSETRON HCL 4 MG/2ML IJ SOLN: 4.0000 mg | Freq: Four times a day (QID) | INTRAMUSCULAR | Status: DC | PRN

## 2014-06-17 MED ORDER — PERFLUTREN LIPID MICROSPHERE
INTRAVENOUS | Status: AC
  Administered 2014-06-17: 2 mL
  Filled 2014-06-17: qty 10

## 2014-06-17 MED ORDER — NITROGLYCERIN 2 % TD OINT
1.0000 [in_us] | TOPICAL_OINTMENT | Freq: Once | TRANSDERMAL | Status: AC
Start: 2014-06-17 — End: 2014-06-17
  Administered 2014-06-17: 1 [in_us] via TOPICAL
  Filled 2014-06-17: qty 1

## 2014-06-17 MED ORDER — ATORVASTATIN CALCIUM 40 MG PO TABS
40.0000 mg | ORAL_TABLET | Freq: Every day | ORAL | Status: DC
  Administered 2014-06-17 – 2014-06-18 (×2): 40 mg via ORAL
  Filled 2014-06-17 (×3): qty 1

## 2014-06-17 MED ORDER — SODIUM CHLORIDE 0.9 % IV SOLN
1.0000 mL/kg/h | INTRAVENOUS | Status: AC
  Administered 2014-06-17: 1 mL/kg/h via INTRAVENOUS

## 2014-06-17 MED ORDER — HEPARIN (PORCINE) IN NACL 2-0.9 UNIT/ML-% IJ SOLN
INTRAMUSCULAR | Status: AC
  Filled 2014-06-17: qty 1000

## 2014-06-17 MED ORDER — CLOPIDOGREL BISULFATE 75 MG PO TABS
75.0000 mg | ORAL_TABLET | Freq: Every day | ORAL | Status: DC
  Administered 2014-06-18 – 2014-06-19 (×2): 75 mg via ORAL
  Filled 2014-06-17 (×3): qty 1

## 2014-06-17 MED ORDER — SODIUM CHLORIDE 0.9 % IJ SOLN
3.0000 mL | Freq: Two times a day (BID) | INTRAMUSCULAR | Status: DC
  Administered 2014-06-17 – 2014-06-18 (×2): 3 mL via INTRAVENOUS

## 2014-06-17 MED ORDER — FENTANYL CITRATE 0.05 MG/ML IJ SOLN
INTRAMUSCULAR | Status: AC
  Filled 2014-06-17: qty 2

## 2014-06-17 MED ORDER — HYDROMORPHONE HCL PF 1 MG/ML IJ SOLN
INTRAMUSCULAR | Status: AC
  Filled 2014-06-17: qty 1

## 2014-06-17 MED ORDER — VERAPAMIL HCL 2.5 MG/ML IV SOLN
INTRAVENOUS | Status: AC
  Filled 2014-06-17: qty 2

## 2014-06-17 MED ORDER — NITROGLYCERIN 1 MG/10 ML FOR IR/CATH LAB
INTRA_ARTERIAL | Status: AC
  Filled 2014-06-17: qty 10

## 2014-06-17 MED ORDER — PRASUGREL HCL 10 MG PO TABS
ORAL_TABLET | ORAL | Status: AC
  Filled 2014-06-17: qty 6

## 2014-06-17 MED ORDER — ASPIRIN 81 MG PO CHEW
81.0000 mg | CHEWABLE_TABLET | Freq: Every day | ORAL | Status: DC
  Administered 2014-06-18 – 2014-06-19 (×2): 81 mg via ORAL
  Filled 2014-06-17 (×2): qty 1

## 2014-06-17 NOTE — H&P (Signed)
Patient ID: Timothy [name] MRN: [id number], DOB/AGE: Apr 30, 1946   Admit date: 06/17/2014   Primary Physician: Leonides Grills, MD Primary Cardiologist: New  Pt. Profile:  Timothy [name] is a 68 y.o. male with a history of DM, bladder cancer s/p TURBT, HTN, hx of tobacco abuse and obesity who presented to [facility] this morning with chest pain and found to have NSTEMI. He was transferred to [facility] for further management.  Last night around 10:30pm last night he stood up after using the bathroom and suddenly developed 6/10 chest pressure that felt like indigestion. The pain came and went but never completely resolved. Nothing made it better or worse. He denies associated shortness of breath, nausea, or vomiting. He has no prior cardiac history and has never had chest pain before. He denies a family history of cardiac problems. He does also admit to some abdominal pain that is described as generalized and crampy. In the [facility] ED; he got no relief with a GI cocktail. ECG w/ no acute ST or TW changes, but troponin returned mildly elevated at 0.49. He had ongoing discomfort so he was given a nitroglycerin/ASA and started on heparin. He is currently chest pain-free on the cath lab table.   Creat 1.21; INR 0.99.    Problem List  Past Medical History  Diagnosis Date  . Diabetes mellitus, type 2   . Hypertension   . Bladder cancer   . Elevated PSA     Past Surgical History  Procedure Laterality Date  . Transurethral resection of bladder tumor N/A 03/14/2013    Procedure: TRANSURETHRAL RESECTION OF BLADDER TUMOR (TURBT);  Surgeon: [name], MD;  Location: [facility];  Service: Urology;  Laterality: N/A;  1 HR WITH MITOMYCIN INSTILLATION   . Transurethral resection of bladder tumor N/A 09/30/2013    Procedure: TRANSURETHRAL RESECTION OF BLADDER TUMOR (TURBT);  Surgeon: [name], MD;  Location: [facility];  Service:  Urology;  Laterality: N/A;  . Transurethral resection of bladder tumor N/A 03/31/2014    Procedure: TRANSURETHRAL RESECTION OF BLADDER TUMOR (TURBT);  Surgeon: [name], MD;  Location: [facility];  Service: Urology;  Laterality: N/A;  . Prostate biopsy N/A 03/31/2014    Procedure: BIOPSY TRANSRECTAL ULTRASONIC PROSTATE (TUBP);  Surgeon: [name], MD;  Location: [facility];  Service: Urology;  Laterality: N/A;     Allergies  No Known Allergies   Home Medications  Prior to Admission medications   Medication Sig Start Date End Date Taking? Authorizing Provider  bisoprolol-hydrochlorothiazide (ZIAC) 5-6.25 MG per tablet Take 1 tablet by mouth daily.    Historical Provider, MD  HYDROcodone-acetaminophen (NORCO) 5-325 MG per tablet Take 1-2 tablets by mouth every 4 (four) hours as needed for moderate pain. 03/31/14   Jorja Loa, MD  metFORMIN (GLUCOPHAGE) 500 MG tablet Take 500 mg by mouth 2 (two) times daily with a meal.    Historical Provider, MD    Family History  History reviewed. No pertinent family history. No family status information on file.     Social History  History   Social History  . Marital Status: Married    Spouse Name: N/A    Number of Children: N/A  . Years of Education: N/A   Occupational History  . Not on file.   Social History Main Topics  . Smoking status: Former Smoker -- 1.00 packs/day for 30 years    Types: Cigarettes  Quit date: 03/11/1993  . Smokeless tobacco: Never Used  . Alcohol Use: No  . Drug Use: No  . Sexual Activity: Not on file   Other Topics Concern  . Not on file   Social History Narrative  . No narrative on file     Review of Systems General:  No chills, fever, night sweats or weight changes.  Cardiovascular:  ++ chest pain, dyspnea on exertion, edema, orthopnea, palpitations, paroxysmal nocturnal dyspnea. Dermatological: No rash, lesions/masses Respiratory: No  cough, dyspnea Urologic: No hematuria, dysuria Abdominal:   No nausea, vomiting, diarrhea, bright red blood per rectum, melena, or hematemesis. ++ crampy abdominal pain  Neurologic:  No visual changes, wkns, changes in mental status. All other systems reviewed and are otherwise negative except as noted above.  Physical Exam  Blood pressure 173/98, pulse 83, temperature 98.7 F (37.1 C), temperature source Oral, resp. rate 16, height 5\' 8"  (1.727 m), weight 212 lb (96.163 kg), SpO2 100.00%.  General: Pleasant, NAD. Appears diaphoretic and pale Psych: Normal affect. Neuro: Alert and oriented X 3. Moves all extremities spontaneously. HEENT: Normal  Neck: Supple without bruits or JVD. Lungs:  Resp regular and unlabored, CTA. Heart: RRR no s3, s4, or murmurs. Abdomen: Soft, non-tender, non-distended, BS + x 4.  Extremities: No clubbing, cyanosis or edema. DP/PT/Radials 2+ and equal bilaterally.  Labs   Recent Labs  06/17/14 0648  TROPONINI 0.49*   Lab Results  Component Value Date   WBC 9.3 06/17/2014   HGB 15.0 06/17/2014   HCT 43.0 06/17/2014   MCV 90.5 06/17/2014   PLT 204 06/17/2014    Recent Labs Lab 06/17/14 0648  NA 140  K 4.0  CL 101  CO2 26  BUN 14  CREATININE 1.21  CALCIUM 8.9  PROT 7.0  BILITOT 0.5  ALKPHOS 67  ALT 20  AST 28  GLUCOSE 178*      Radiology/Studies  No results found.  ECG  NSR with TWI in III and AVF seen in previous tracings.  ASSESSMENT AND PLAN  Timothy [name] is a 68 y.o. male with a history of DM, bladder cancer s/p TURBT, HTN, hx of tobacco abuse and obesity who presented to [facility] this morning with chest pain and found to have NSTEMI. He was transferred to [facility] for further management.  Plan - patient with ongoing chest pain and NSTEMI. Being prepped for urgent cardiac cath.  2D Echo post Cath, aggressive Rx of Cardiac RFs.   Tyrell Antonio, PA-C 06/17/2014, 9:44 AM  Pager  [phone]   I saw & examined the patient in the Cardiac Cath Lab along with Ms. Vertell Limber, PA-C.   I agree with her H&P. Plan urgent cath +/- PCI.  Further plans per Cath Note.  [name], MD

## 2014-06-17 NOTE — ED Notes (Signed)
Two peripheral IVs established. Nitro paste on. Heparin bolus given. Heparin gtt infusing per order. Pt on bedside monitor. VSS. Family at bedside. NAD. Pt denies all pain and SOB. No complaints per patient.

## 2014-06-17 NOTE — Progress Notes (Signed)
ANTICOAGULATION CONSULT NOTE - Initial Consult  Pharmacy Consult for Heparin Indication: chest pain/ACS  No Known Allergies  Patient Measurements: Height: 5\' 8"  (172.7 cm) Weight: 212 lb (96.163 kg) IBW/kg (Calculated) : 68.4  Vital Signs: Temp: 97.7 F (36.5 C) (07/14 0551) Temp src: Oral (07/14 0551) BP: 185/97 mmHg (07/14 0551) Pulse Rate: 73 (07/14 0551)  Labs:  Recent Labs  06/17/14 0648  HGB 15.0  HCT 43.0  PLT 204  CREATININE 1.21  TROPONINI 0.49*   Estimated Creatinine Clearance: 66.6 ml/min (by C-G formula based on Cr of 1.21).  Medical History: Past Medical History  Diagnosis Date  . Diabetes mellitus, type 2   . Hypertension   . Bladder cancer   . Elevated PSA    Medications:   (Not in a hospital admission)  Assessment: 68yo male c/o upper abdominal pain.  Troponin elevated.  Asked to initiate IV Heparin for ACS.  Goal of Therapy:  Heparin level 0.3-0.7 units/ml Monitor platelets by anticoagulation protocol: Yes   Plan:  Heparin 4000 units IV bolus now x 1 Heparin infusion at 12 units/Kg/Hr (using ADJ bw) Heparin level in 6-8 hours then daily CBC daily while on Heparin  [place] Crane, Jalal Rauch A 06/17/2014,7:38 AM

## 2014-06-17 NOTE — Care Management Note (Signed)
    Page 1 of 1   06/17/2014     12:10:21 PM CARE MANAGEMENT NOTE 06/17/2014  Patient:  Timothy [name], Timothy [name]   Account Number:  [id number]  Date Initiated:  06/17/2014  Documentation initiated by:  Elissa Hefty  Subjective/Objective Assessment:   adm w mi     Action/Plan:   lives w wife, pcp dr Elsie Lincoln   Anticipated DC Date:     Anticipated DC Plan:        [place]  CM consult  Medication Assistance      Choice offered to / List presented to:             Status of service:   Medicare Important Message given?   (If response is "NO", the following Medicare IM given date fields will be blank) Date Medicare IM given:   Medicare IM given by:   Date Additional Medicare IM given:   Additional Medicare IM given by:    Discharge Disposition:    Per UR Regulation:  Reviewed for med. necessity/level of care/duration of stay  If discussed at [place] of Stay Meetings, dates discussed:    Comments:  7/14 1209 debbie Wilberto Console rn,bsn gave pt 30day free effient card.

## 2014-06-17 NOTE — CV Procedure (Signed)
CARDIAC CATHETERIZATION AND PERCUTANEOUS CORONARY INTERVENTION REPORT  NAME:  Timothy [name]   MRN: [id number] DOB:  15-Apr-1946   ADMIT DATE: 06/17/2014 Procedure Date: 06/17/2014  INTERVENTIONAL CARDIOLOGIST: [name], M.D., MS PRIMARY CARE PROVIDER: Leonides Grills, MD PRIMARY CARDIOLOGIST: New to [facility] (will be [place] Office patient)  PATIENT:  Timothy [name] is a 68 y.o. male with HTN & DM-2 on PO meds, , bladder cancer s/p TURBT, HTN, hx of tobacco abuse and obesity who presented to [facility] this morning with chest pain and found to have NSTEMI. He was transferred to [facility] for further urgent cardiac catheterization because of ongoing pain.   PRE-OPERATIVE DIAGNOSIS:    NSTEMI  PROCEDURES PERFORMED:    Left Heart Catheterization with Native Coronary Angiography  via Right Radial Artery   Left Ventriculography  Percutaneous Coronary Angioplasty Only (PTCA/POBA) of the distal Circumflex   PROCEDURE: The patient was brought to the 2nd [facility]iac Catheterization Lab in the fasting state and prepped and draped in the usual sterile fashion for Right Radial artery access. A modified Allen's test was performed on the Right wrist demonstrating excellent collateral flow for radial access.   Sterile technique was used including antiseptics, cap, gloves, gown, hand hygiene, mask and sheet. Skin prep: Chlorhexidine.   Consent: Risks of procedure as well as the alternatives and risks of each were explained to the (patient/caregiver). Consent for procedure obtained.   Time Out: Verified patient identification, verified procedure, site/side was marked, verified correct patient position, special equipment/implants available, medications/allergies/relevent history reviewed, required imaging and test results available. Performed.  Access:   Right Radial Artery: 6 Fr Sheath -  Seldinger Technique (Angiocath Micropuncture Kit)  Radial  Cocktail - 10 mL; IV Heparin 4000 Units   Left Heart Catheterization: 5Fr Catheters advanced or exchanged over a long-exchange Safety-J-wire; TIG 4.0 catheter advanced first.  Left & Right Coronary Artery Cineangiography: TIG 4.0 Catheter   LV Hemodynamics (LV Gram): Angled Pigtail.  Sheath removed in the Cath lab with TR band placed for hemostasis.  TR Band: 1160  Hours; 17 mL air  FINDINGS:  Hemodynamics:   Central Aortic Pressure / Mean: 108/62/83 mmHg  Left Ventricular Pressure / LVEDP: 107/7/17 mmHg  Left Ventriculography:  EF: ~45% %  Wall Motion: global Hypokinesis  Coronary Anatomy:  Dominance: Right  Left Main: Large caliber, long trunk that trifurcates into the LAD, Circumflex & Ramus Intermedius. Angiographically normal. LAD: Begins as a large caliber vessel that tapers into a relatively small caliber (~1.5-2.0 mm) vessel distally.  It wraps the apex, perfusing the distal 1/3 of the infero-apex. The distal vessel has tandem 60&70-80% focal lesions prior to the apex.  D1: Very small caliber bifurcating vessel with proximal ~70% stenosis.  Left Circumflex: Begins as a large caliber vessel that also tapers to a small to moderate caliber vessel in the AV Groove where it gives off a small caliber OM1 branch and is 100% occluded just beyond that point.    Post-PTCA: beyond the occluded AV Groove Circumflex, there is a small to moderate caliber LPL branch.  Ramus intermedius: Large caliber vessel that bifurcates in the mid-vessel into to small to moderate caliber branches that both reach the apex.   RCA: Large caliber, dominant vessel with diffuse ~20-40% lesions, but no obstructive disease.  It bifurcates distally in to a small caliber, short PDA and Right Posterior AV Groove Branch (RPAV) branches.  RPAV gives off several very small banches.  After reviewing  the initial angiography, the culprit lesion was thought to be the occluded distal-AV Groove circumflesx.   Preparation were made to proceed with PTCA on this lesion.  Percutaneous Coronary Intervention:   Guide: 6 Fr   CLS 3.5  -- very difficult guide support, catheter moved & disengaged with patient movement Guidewire: BMW (advanced initially in to an atrial branch for support & balloon advanced into proximal Circumflex allowing for further advancmemt of the wire beyond the culprit lesion. Predilation Balloon #1: Emerge 1.5 mm x 12 mm; Used to assist with wire support to advance guidewire  6 Atm x 30 Sec, 8 Atm x 30 Sec  Post inflation revealed a small to moderate follow-on circumflex with LPL branch.    Due to the relatively small sized distal vessel, the decision was to perform PTCA only. Balloon #2: Euphora 2.0 mm x 12 mm;   8 Atm x 90 Sec x 2 with IC NTG - no recoil  Final Diameter: ~2.0 mm  Post deployment angiography in multiple views, with and without guidewire in place revealed excellent stent deployment and lesion coverage.  There was no evidence of dissection or perforation.  MEDICATIONS:  Anesthesia:  Local Lidocaine 2 ml  Sedation:  2 mg IV Versed, 50 mcg IV fentanyl ;   Premedication: 4000 Units IV Heparin  Omnipaque Contrast: 200 ml  Anticoagulation:  IV Heparin 4000 Units ;   Angiomax Bolus & drip for PCI  Anti-Platelet Agent:  Effient 60 mg (plan to convert to Plavix in 1 month)  PATIENT DISPOSITION:    The patient was transferred to the PACU holding area in a hemodynamicaly stable, chest pain free condition.  The patient tolerated the procedure well, and there were no complications.  EBL:   < 5 ml  The patient was stable before, during, and after the procedure.  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  Moderately reduced LVEF by LV Gram  Normal LVEDP  PLAN  OF CARE:  Admit to TCU, Post Radial Cath care.  Re-start IV Heparin 6 hr post TR band - for 48 hrs (due to PTCA only & residual LAD disease).  Restart home BB dose, add statin  Hold metformin x 48 hr.  2D Echo  Anticipate fast-track d/c in 2-3 days. Most convenient follow-up is likely [place] [facility])   [name], M.D., M.S. [facility] [address] [place], [place]  [address]  [phone]  06/17/2014 11:38 AM

## 2014-06-17 NOTE — H&P (Signed)
History and Physical Interval Note:  NAME:  Timothy [name]   MRN: [id number] DOB:  June 23, 1946   ADMIT DATE: 06/17/2014   06/17/2014 9:50 AM  Timothy [name] is a 68 y.o. male PMH below transferred from [facility] ER with NSTEMI. Pain began ~10 PM last PM - intermittent over night.  Finally went to ER in AM.  ECG without Injury pattern but potential ischemic changes, & [place] + @ 0.4 with ongoing intermittent CP.  Became diaphoretic en route with EMS after morphine.  BP remains stable. Transferred to [facility] Cardiac catheterization lab for [facility] +/- PCI.   Past Medical History  Diagnosis Date  . Diabetes mellitus, type 2   . Hypertension   . Bladder cancer   . Elevated PSA    Past Surgical History  Procedure Laterality Date  . Transurethral resection of bladder tumor N/A 03/14/2013    Procedure: TRANSURETHRAL RESECTION OF BLADDER TUMOR (TURBT);  Surgeon: [name], MD;  Location: [facility];  Service: Urology;  Laterality: N/A;  1 HR WITH MITOMYCIN INSTILLATION   . Transurethral resection of bladder tumor N/A 09/30/2013    Procedure: TRANSURETHRAL RESECTION OF BLADDER TUMOR (TURBT);  Surgeon: [name], MD;  Location: [facility];  Service: Urology;  Laterality: N/A;  . Transurethral resection of bladder tumor N/A 03/31/2014    Procedure: TRANSURETHRAL RESECTION OF BLADDER TUMOR (TURBT);  Surgeon: [name], MD;  Location: [facility];  Service: Urology;  Laterality: N/A;  . Prostate biopsy N/A 03/31/2014    Procedure: BIOPSY TRANSRECTAL ULTRASONIC PROSTATE (TUBP);  Surgeon: [name], MD;  Location: [facility];  Service: Urology;  Laterality: N/A;    FAMHx: See full H&P by PA.   History reviewed. No pertinent family history.  SOCHx:  reports that he quit smoking about 21 years ago. His smoking use included Cigarettes. He has a 30 pack-year smoking history. He has never used smokeless tobacco.  He reports that he does not drink alcohol or use illicit drugs.  ALLERGIES: No Known Allergies  HOME MEDICATIONS: Prescriptions prior to admission  Medication Sig Dispense Refill  . bisoprolol-hydrochlorothiazide (ZIAC) 5-6.25 MG per tablet Take 1 tablet by mouth daily.      Marland Kitchen HYDROcodone-acetaminophen (NORCO) 5-325 MG per tablet Take 1-2 tablets by mouth every 4 (four) hours as needed for moderate pain.  30 tablet  0  . metFORMIN (GLUCOPHAGE) 500 MG tablet Take 500 mg by mouth 2 (two) times daily with a meal.        PHYSICAL EXAM:Blood pressure 173/98, pulse 83, temperature 98.7 F (37.1 C), temperature source Oral, resp. rate 16, height 5\' 8"  (1.727 m), weight 212 lb (96.163 kg), SpO2 100.00%. See full H&P by PA.   Adult ECG Report  Rate: 70 ;  Rhythm: normal sinus rhythm - with inferior TWI & biphasic S-T segments.  IMPRESSION & PLAN The patients' history has been reviewed, patient examined, no change in status from most recent note, stable for surgery. I have reviewed the patients' chart and labs. Questions were answered to the patient's satisfaction.    Timothy [name] has presented today for surgery, with the diagnosis of NSTEMI.  The various methods of treatment have been discussed with the patient and family.   Risks / Complications include, but not limited to: Death, MI, CVA/TIA, VF/VT (with defibrillation), Bradycardia (need for temporary pacer placement), contrast induced nephropathy, bleeding / bruising / hematoma / pseudoaneurysm, vascular or coronary injury (with possible emergent CT or Vascular Surgery),  adverse medication reactions, infection.     After consideration of risks, benefits and other options for treatment, the patient has consented to Procedure(s):  LEFT HEART CATHETERIZATION AND CORONARY ANGIOGRAPHY +/- AD [place]   as a surgical intervention.   We will proceed with the planned procedure.   See full H&P by PA -  pending   [place] [address] [place], [place]  [address]  [phone]  06/17/2014 9:50 AM

## 2014-06-17 NOTE — ED Notes (Signed)
CRITICAL VALUE ALERT  Critical value received:  Trop 0.49  Date of notification:  06/17/2014  Time of notification:  0717  Critical value read back:Yes.    Nurse who received alert:  Iona Coach, RN     Responding MD:  Roxanne Mins  Time MD responded:  [phone]

## 2014-06-17 NOTE — ED Provider Notes (Signed)
CSN: [id number]     Arrival date & time 06/17/14  0536 History   First MD Initiated Contact with Patient 06/17/14 [phone]     Chief Complaint  Patient presents with  . Abdominal Pain     (Consider location/radiation/quality/duration/timing/severity/associated sxs/prior Treatment) Patient is a 68 y.o. male presenting with abdominal pain. The history is provided by the patient.  Abdominal Pain He had onset about 10:30 PM of crampy generalized abdominal pain with some radiation to the back and to the chest and both upper arms. He rates pain at 8/10 at its worst and then it subsides greatly but does not go away. Nothing makes it better nothing makes it worse. He denies associated dyspnea, nausea, diaphoresis. He denies constipation or diarrhea. He rates the pain started about 3 hours after eating a hamburger. He has not taken anything to try to help his pain.  Past Medical History  Diagnosis Date  . Diabetes mellitus, type 2   . Hypertension   . Bladder cancer   . Elevated PSA    Past Surgical History  Procedure Laterality Date  . Transurethral resection of bladder tumor N/A 03/14/2013    Procedure: TRANSURETHRAL RESECTION OF BLADDER TUMOR (TURBT);  Surgeon: [name], MD;  Location: [facility];  Service: Urology;  Laterality: N/A;  1 HR WITH MITOMYCIN INSTILLATION   . Transurethral resection of bladder tumor N/A 09/30/2013    Procedure: TRANSURETHRAL RESECTION OF BLADDER TUMOR (TURBT);  Surgeon: [name], MD;  Location: [facility];  Service: Urology;  Laterality: N/A;  . Transurethral resection of bladder tumor N/A 03/31/2014    Procedure: TRANSURETHRAL RESECTION OF BLADDER TUMOR (TURBT);  Surgeon: [name], MD;  Location: [facility];  Service: Urology;  Laterality: N/A;  . Prostate biopsy N/A 03/31/2014    Procedure: BIOPSY TRANSRECTAL ULTRASONIC PROSTATE (TUBP);  Surgeon: [name], MD;  Location: [facility];  Service: Urology;  Laterality: N/A;   History reviewed. No pertinent family history. History  Substance Use Topics  . Smoking status: Former Smoker -- 1.00 packs/day for 30 years    Types: Cigarettes    Quit date: 03/11/1993  . Smokeless tobacco: Never Used  . Alcohol Use: No    Review of Systems  Gastrointestinal: Positive for abdominal pain.  All other systems reviewed and are negative.     Allergies  Review of patient's allergies indicates no known allergies.  Home Medications   Prior to Admission medications   Medication Sig Start Date End Date Taking? Authorizing Provider  bisoprolol-hydrochlorothiazide (ZIAC) 5-6.25 MG per tablet Take 1 tablet by mouth daily.    Historical Provider, MD  HYDROcodone-acetaminophen (NORCO) 5-325 MG per tablet Take 1-2 tablets by mouth every 4 (four) hours as needed for moderate pain. 03/31/14   Jorja Loa, MD  metFORMIN (GLUCOPHAGE) 500 MG tablet Take 500 mg by mouth 2 (two) times daily with a meal.    Historical Provider, MD   BP 185/97  Pulse 73  Temp(Src) 97.7 F (36.5 C) (Oral)  Resp 20  Ht 5\' 8"  (1.727 m)  Wt 212 lb (96.163 kg)  BMI 32.24 kg/m2  SpO2 98% Physical Exam  Nursing note and vitals reviewed.  68 year old male, resting comfortably and in no acute distress. Vital signs are significant for hypertension with blood pressure 185/97. Oxygen saturation is 98%, which is normal. Head is normocephalic and atraumatic. PERRLA, EOMI. Oropharynx is clear. Neck is nontender and supple without adenopathy or JVD.  Back is nontender and there is no CVA tenderness. Lungs are clear without rales, wheezes, or rhonchi. Chest is nontender. Heart has regular rate and rhythm without murmur. Abdomen is soft, flat, with mild midabdominal tenderness but no rebound or guarding. There are no masses or hepatosplenomegaly and peristalsis is normoactive. Extremities have no cyanosis or edema, full range of motion is  present. Skin is warm and dry without rash. Neurologic: Mental status is normal, cranial nerves are intact, there are no motor or sensory deficits.  ED Course  Procedures (including critical care time) Labs Review Results for orders placed during the hospital encounter of 06/17/14  CBC WITH DIFFERENTIAL      Result Value Ref Range   WBC 9.3  4.0 - 10.5 K/uL   RBC 4.75  4.22 - 5.81 MIL/uL   Hemoglobin 15.0  13.0 - 17.0 g/dL   HCT 43.0  39.0 - 52.0 %   MCV 90.5  78.0 - 100.0 fL   MCH 31.6  26.0 - 34.0 pg   MCHC 34.9  30.0 - 36.0 g/dL   RDW 14.5  11.5 - 15.5 %   Platelets 204  150 - 400 K/uL   Neutrophils Relative % 78 (*) 43 - 77 %   Neutro Abs 7.3  1.7 - 7.7 K/uL   Lymphocytes Relative 17  12 - 46 %   Lymphs Abs 1.6  0.7 - 4.0 K/uL   Monocytes Relative 4  3 - 12 %   Monocytes Absolute 0.4  0.1 - 1.0 K/uL   Eosinophils Relative 1  0 - 5 %   Eosinophils Absolute 0.1  0.0 - 0.7 K/uL   Basophils Relative 0  0 - 1 %   Basophils Absolute 0.0  0.0 - 0.1 K/uL  COMPREHENSIVE METABOLIC PANEL      Result Value Ref Range   Sodium 140  137 - 147 mEq/L   Potassium 4.0  3.7 - 5.3 mEq/L   Chloride 101  96 - 112 mEq/L   CO2 26  19 - 32 mEq/L   Glucose, Bld 178 (*) 70 - 99 mg/dL   BUN 14  6 - 23 mg/dL   Creatinine, Ser 1.21  0.50 - 1.35 mg/dL   Calcium 8.9  8.4 - 10.5 mg/dL   Total Protein 7.0  6.0 - 8.3 g/dL   Albumin 3.9  3.5 - 5.2 g/dL   AST 28  0 - 37 U/L   ALT 20  0 - 53 U/L   Alkaline Phosphatase 67  39 - 117 U/L   Total Bilirubin 0.5  0.3 - 1.2 mg/dL   GFR calc non Af Amer 60 (*) >90 mL/min   GFR calc Af Amer 70 (*) >90 mL/min   Anion gap 13  5 - 15  LIPASE, BLOOD      Result Value Ref Range   Lipase 47  11 - 59 U/L  TROPONIN I      Result Value Ref Range   Troponin I 0.49 (*) <0.30 ng/mL    EKG Interpretation   Date/Time:  Tuesday June 17 2014 07:01:19 EDT Ventricular Rate:  70 PR Interval:  155 QRS Duration: 90 QT Interval:  381 QTC Calculation: 411 R Axis:    58 Text Interpretation:  Sinus rhythm Low voltage, precordial leads Probable  anteroseptal infarct, old Borderline T abnormalities, inferior leads When  compared with ECG of 03/14/2013, No significant change was found Confirmed  by [facility]  MD, Amry Cathy ([id number]) on 06/17/2014 7:21:51 AM  CRITICAL CARE Performed by: RUEAV,[email or web address] Total critical care time: 45 minutes. Critical care time was exclusive of separately billable procedures and treating other patients. Critical care was necessary to treat or prevent imminent or life-threatening deterioration. Critical care was time spent personally by me on the following activities: development of treatment plan with patient and/or surrogate as well as nursing, discussions with consultants, evaluation of patient's response to treatment, examination of patient, obtaining history from patient or surrogate, ordering and performing treatments and interventions, ordering and review of laboratory studies, ordering and review of radiographic studies, pulse oximetry and re-evaluation of patient's condition.  MDM   Final diagnoses:  Acute coronary syndrome    Abdominal pain of uncertain cause. Old records have been reviewed and he had a recent CT angiogram of the abdomen which showed diverticulosis, and probable bladder cancer. Screening labs be obtained and he will be given therapeutic trial of a GI cocktail.  He got no relief with a GI cocktail. ECG is unchanged from previous ECG, but troponin has come back mildly elevated at 0.49. Because he is still having some discomfort, he is given a nitroglycerin and started on heparin and given aspirin and he'll need to be transferred to [facility].  After above-noted treatment, he is still having slight discomfort. Case has been discussed with Dr. Acie Fredrickson of cardiology service who agrees to accept the patient in transfer for urgent cardiac catheterization.  Delora Fuel, MD 19/14/78 [id number]

## 2014-06-17 NOTE — ED Notes (Signed)
Pt reporting pain in upper abdomen for about 12 hours.  Denies any nausea or vomiting.  Denies issues with diarrhea or constipation.

## 2014-06-17 NOTE — ED Notes (Signed)
Spoke with Sharene Butters, RN in cath lab at [facility]. Report given. All questions answered.

## 2014-06-17 NOTE — Progress Notes (Signed)
ANTICOAGULATION CONSULT NOTE   Pharmacy Consult for Heparin Indication: chest pain/ACS  No Known Allergies  Patient Measurements: Height: 5\' 8"  (172.7 cm) Weight: 216 lb 0.8 oz (98 kg) IBW/kg (Calculated) : 68.4  Vital Signs: Temp: 97.2 F (36.2 C) (07/14 1208) Temp src: Axillary (07/14 1208) BP: 138/80 mmHg (07/14 1208) Pulse Rate: 64 (07/14 1208)  Labs:  Recent Labs  06/17/14 0648 06/17/14 0731  HGB 15.0  --   HCT 43.0  --   PLT 204  --   APTT  --  29  LABPROT  --  13.1  INR  --  0.99  CREATININE 1.21  --   TROPONINI 0.49*  --    Estimated Creatinine Clearance: 67.2 ml/min (by C-G formula based on Cr of 1.21).  Medical History: Past Medical History  Diagnosis Date  . Diabetes mellitus, type 2   . Hypertension   . Bladder cancer   . Elevated PSA    Medications:  Prescriptions prior to admission  Medication Sig Dispense Refill  . bisoprolol-hydrochlorothiazide (ZIAC) 5-6.25 MG per tablet Take 1 tablet by mouth daily.      Marland Kitchen glimepiride (AMARYL) 2 MG tablet Take 2 mg by mouth 2 (two) times daily.      . metFORMIN (GLUCOPHAGE) 500 MG tablet Take 500 mg by mouth 2 (two) times daily with a meal.        Assessment: 68yo male c/o upper abdominal pain.  Troponin elevated.  Asked to initiate IV Heparin for ACS.  Patient is now s/p cath with with successful PTCA of the occluded AV groove circumflex. Patient loaded with effient in cath lab, orders to start IV heparin 6h post cath and continue for 48h d/t PTCA only and residual LAD disease.  TR band inflated at 1130am, no hematoma or other complications noted.  Goal of Therapy:  Heparin level 0.3-0.7 units/ml Monitor platelets by anticoagulation protocol: Yes   Plan:  Restart Heparin infusion at 1000 units/hr tonight Heparin level in 6hours then daily CBC daily while on Heparin  Erin Hearing PharmD., BCPS Clinical Pharmacist Pager [phone] 06/17/2014 12:38 PM

## 2014-06-17 NOTE — ED Notes (Signed)
Report given to [facility] by C edwards to [employer]

## 2014-06-17 NOTE — Progress Notes (Signed)
  Echocardiogram 2D Echocardiogram with Definity has been performed.  Diamond Nickel 06/17/2014, 3:14 PM

## 2014-06-18 LAB — CBC
HEMATOCRIT: 39.9 % (ref 39.0–52.0)
HEMOGLOBIN: 13.7 g/dL (ref 13.0–17.0)
MCH: 31.1 pg (ref 26.0–34.0)
MCHC: 34.3 g/dL (ref 30.0–36.0)
MCV: 90.5 fL (ref 78.0–100.0)
Platelets: 203 10*3/uL (ref 150–400)
RBC: 4.41 MIL/uL (ref 4.22–5.81)
RDW: 14.5 % (ref 11.5–15.5)
WBC: 13.9 10*3/uL — ABNORMAL HIGH (ref 4.0–10.5)

## 2014-06-18 LAB — BASIC METABOLIC PANEL
ANION GAP: 17 — AB (ref 5–15)
ANION GAP: 16 — AB (ref 5–15)
BUN: 13 mg/dL (ref 6–23)
BUN: 14 mg/dL (ref 6–23)
CALCIUM: 8.5 mg/dL (ref 8.4–10.5)
CO2: 24 mEq/L (ref 19–32)
CO2: 26 mEq/L (ref 19–32)
CREATININE: 1.18 mg/dL (ref 0.50–1.35)
Calcium: 9.1 mg/dL (ref 8.4–10.5)
Chloride: 98 mEq/L (ref 96–112)
Chloride: 96 mEq/L (ref 96–112)
Creatinine, Ser: 1.11 mg/dL (ref 0.50–1.35)
GFR calc non Af Amer: 62 mL/min — ABNORMAL LOW (ref >90)
GFR, EST AFRICAN AMERICAN: 77 mL/min — AB (ref >90)
GFR, EST AFRICAN AMERICAN: 72 mL/min — AB (ref >90)
GFR, EST NON AFRICAN AMERICAN: 67 mL/min — AB (ref >90)
GLUCOSE: 129 mg/dL — AB (ref 70–99)
Glucose, Bld: 134 mg/dL — ABNORMAL HIGH (ref 70–99)
POTASSIUM: 4 meq/L (ref 3.7–5.3)
Potassium: 3.6 mEq/L — ABNORMAL LOW (ref 3.7–5.3)
Sodium: 139 mEq/L (ref 137–147)
Sodium: 138 mEq/L (ref 137–147)

## 2014-06-18 LAB — HEPARIN LEVEL (UNFRACTIONATED)
HEPARIN UNFRACTIONATED: 0.47 [IU]/mL (ref 0.30–0.70)
HEPARIN UNFRACTIONATED: 0.49 [IU]/mL (ref 0.30–0.70)
Heparin Unfractionated: <0.1 IU/mL — ABNORMAL LOW (ref 0.30–0.70)

## 2014-06-18 LAB — GLUCOSE, CAPILLARY
GLUCOSE-CAPILLARY: 126 mg/dL — AB (ref 70–99)
GLUCOSE-CAPILLARY: 131 mg/dL — AB (ref 70–99)
Glucose-Capillary: 132 mg/dL — ABNORMAL HIGH (ref 70–99)

## 2014-06-18 MED ORDER — POTASSIUM CHLORIDE CRYS ER 20 MEQ PO TBCR
30.0000 meq | EXTENDED_RELEASE_TABLET | Freq: Once | ORAL | Status: AC
  Administered 2014-06-18: 30 meq via ORAL
  Filled 2014-06-18 (×2): qty 1

## 2014-06-18 MED ORDER — LOSARTAN POTASSIUM 50 MG PO TABS
50.0000 mg | ORAL_TABLET | Freq: Every day | ORAL | Status: DC
  Administered 2014-06-18 – 2014-06-19 (×2): 50 mg via ORAL
  Filled 2014-06-18 (×2): qty 1

## 2014-06-18 MED ORDER — METOPROLOL SUCCINATE ER 25 MG PO TB24
25.0000 mg | ORAL_TABLET | Freq: Every day | ORAL | Status: DC
  Administered 2014-06-18 – 2014-06-19 (×2): 25 mg via ORAL
  Filled 2014-06-18 (×2): qty 1

## 2014-06-18 MED FILL — Sodium Chloride IV Soln 0.9%: INTRAVENOUS | Qty: 50 | Status: AC

## 2014-06-18 NOTE — Progress Notes (Signed)
[place] for Heparin Indication: chest pain/ACS  No Known Allergies  Patient Measurements: Height: 5\' 8"  (172.7 cm) Weight: 208 lb 5.4 oz (94.5 kg) IBW/kg (Calculated) : 68.4  Vital Signs: Temp: 99.4 F (37.4 C) (07/15 1630) Temp src: Oral (07/15 1630) BP: 157/80 mmHg (07/15 1630) Pulse Rate: 72 (07/15 1500)  Labs:  Recent Labs  06/17/14 0648 06/17/14 0731 06/18/14 0230 06/18/14 1245 06/18/14 1914  HGB 15.0  --  13.7  --   --   HCT 43.0  --  39.9  --   --   PLT 204  --  203  --   --   APTT  --  29  --   --   --   LABPROT  --  13.1  --   --   --   INR  --  0.99  --   --   --   HEPARINUNFRC  --   --  <0.10* 0.47 0.49  CREATININE 1.21  --  1.11 1.18  --   TROPONINI 0.49*  --   --   --   --    Estimated Creatinine Clearance: 67.7 ml/min (by C-G formula based on Cr of 1.18).   Assessment: 68 yo male with CAD s/p PTCA on heparin and noted at goal at 1250 units/hr.   Goal of Therapy:  Heparin level 0.3-0.7 units/ml Monitor platelets by anticoagulation protocol: Yes   Plan:  -No heparin changes needed -Daily heparin level and CBC  Heide Guile, PharmD, BCPS Clinical Pharmacist Pager [phone]   06/18/2014 7:49 PM

## 2014-06-18 NOTE — Progress Notes (Signed)
[place] for Heparin Indication: chest pain/ACS  No Known Allergies  Patient Measurements: Height: 5\' 8"  (172.7 cm) Weight: 208 lb 5.4 oz (94.5 kg) IBW/kg (Calculated) : 68.4  Vital Signs: Temp: 98.5 F (36.9 C) (07/15 0300) Temp src: Oral (07/15 0300) BP: 159/75 mmHg (07/15 0300) Pulse Rate: 69 (07/15 0300)  Labs:  Recent Labs  06/17/14 0648 06/17/14 0731 06/18/14 0230  HGB 15.0  --  13.7  HCT 43.0  --  39.9  PLT 204  --  203  APTT  --  29  --   LABPROT  --  13.1  --   INR  --  0.99  --   HEPARINUNFRC  --   --  <0.10*  CREATININE 1.21  --  1.11  TROPONINI 0.49*  --   --    Estimated Creatinine Clearance: 72 ml/min (by C-G formula based on Cr of 1.11).   Assessment: 68 yo male with CAD s/p PTCA for heparin  Goal of Therapy:  Heparin level 0.3-0.7 units/ml Monitor platelets by anticoagulation protocol: Yes   Plan:  Increase Heparin 1250 units/hr Check heparin level in 8 hours.  Phillis Knack, PharmD, BCPS   06/18/2014 4:21 AM

## 2014-06-18 NOTE — Progress Notes (Signed)
Patient ID: Timothy [name], male   DOB: 02-19-1946, 67 y.o.   MRN: [id number]    Subjective:  Denies SSCP, palpitations or Dyspnea   Objective:  Filed Vitals:   06/17/14 2300 06/18/14 0300 06/18/14 0700 06/18/14 0800  BP: 167/71 159/75 143/64 159/82  Pulse: 76 69    Temp: 98.8 F (37.1 C) 98.5 F (36.9 C)  98.2 F (36.8 C)  TempSrc: Oral Oral  Oral  Resp: 8 12 15 18   Height:      Weight:  208 lb 5.4 oz (94.5 kg)    SpO2: 98% 95%  98%    Intake/Output from previous day:  Intake/Output Summary (Last 24 hours) at 06/18/14 4401 Last data filed at 06/18/14 0600  Gross per 24 hour  Intake 812.89 ml  Output    875 ml  Net -62.11 ml    Physical Exam: Affect appropriate Healthy:  appears stated age HEENT: normal Neck supple with no adenopathy JVP normal no bruits no thyromegaly Lungs clear with no wheezing and good diaphragmatic motion Heart:  S1/S2 no murmur, no rub, gallop or click PMI normal Abdomen: benighn, BS positve, no tenderness, no AAA no bruit.  No HSM or HJR Distal pulses intact with no bruits  Right radial A  No edema Neuro non-focal Skin warm and dry No muscular weakness   Lab Results: Basic Metabolic Panel:  Recent Labs  06/17/14 0648 06/18/14 0230  NA 140 139  K 4.0 3.6*  CL 101 98  CO2 26 24  GLUCOSE 178* 129*  BUN 14 13  CREATININE 1.21 1.11  CALCIUM 8.9 8.5   Liver Function Tests:  Recent Labs  06/17/14 0648  AST 28  ALT 20  ALKPHOS 67  BILITOT 0.5  PROT 7.0  ALBUMIN 3.9    Recent Labs  06/17/14 0648  LIPASE 47   CBC:  Recent Labs  06/17/14 0648 06/18/14 0230  WBC 9.3 13.9*  NEUTROABS 7.3  --   HGB 15.0 13.7  HCT 43.0 39.9  MCV 90.5 90.5  PLT 204 203   Cardiac Enzymes:  Recent Labs  06/17/14 0648  TROPONINI 0.49*    Imaging: No results found.  Cardiac Studies:  ECG:  SR inferolateral T wave changes no ST elevation   Telemetry:  NSR no VT   Echo:   EF 50-55%   Medications:   . aspirin  81 mg  Oral Daily  . atorvastatin  40 mg Oral q1800  . bisoprolol-hydrochlorothiazide  1 tablet Oral Daily  . clopidogrel  75 mg Oral Q breakfast  . sodium chloride  3 mL Intravenous Q12H     . heparin 1,250 Units/hr (06/18/14 0500)    Assessment/Plan:  CAD:   Stenting of small circumflex with residual distal LAD disease  EF 50-55%  Small bump in troponin  48 hrs heparin  Per DH.  Continue ASA and Plavix   HTN:  Change combination pill add low dose ACE Chol:  On statin    Jenkins Rouge 06/18/2014, 9:29 AM

## 2014-06-18 NOTE — Progress Notes (Signed)
[place] for Heparin Indication: chest pain/ACS  No Known Allergies  Patient Measurements: Height: 5\' 8"  (172.7 cm) Weight: 208 lb 5.4 oz (94.5 kg) IBW/kg (Calculated) : 68.4  Vital Signs: Temp: 98.9 F (37.2 C) (07/15 1200) Temp src: Other (Comment) (07/15 1200) BP: 151/76 mmHg (07/15 1200) Pulse Rate: 69 (07/15 0300)  Labs:  Recent Labs  06/17/14 0648 06/17/14 0731 06/18/14 0230 06/18/14 1245  HGB 15.0  --  13.7  --   HCT 43.0  --  39.9  --   PLT 204  --  203  --   APTT  --  29  --   --   LABPROT  --  13.1  --   --   INR  --  0.99  --   --   HEPARINUNFRC  --   --  <0.10* 0.47  CREATININE 1.21  --  1.11 1.18  TROPONINI 0.49*  --   --   --    Estimated Creatinine Clearance: 67.7 ml/min (by C-G formula based on Cr of 1.18).   Assessment: 68 yo male with CAD s/p PTCA on heparin and noted at goal at 1250 units/hr. Patient to continue on heparin for 48hrs post cath.  Goal of Therapy:  Heparin level 0.3-0.7 units/ml Monitor platelets by anticoagulation protocol: Yes   Plan:  -No heparin changes needed -Heparin level to confirm in 6 hrs -Daily heparin level and CBC  Hildred Laser, Pharm D 06/18/2014 1:52 PM

## 2014-06-18 NOTE — Progress Notes (Signed)
CARDIAC REHAB PHASE I   PRE:  Rate/Rhythm: 72 SR  BP:  Supine: 160/90  Sitting:   Standing:    SaO2:   MODE:  Ambulation: 350 ft   POST:  Rate/Rhythm: 88 SAR  BP:  Supine:   Sitting: 131/87  Standing:    SaO2:  1045-1125 Pt walked 350 ft with steady gait and asst x1. Tolerated well. No CP. Began MI ed. Gave booklet and discussed restrictions, NTG use for angina, PCI and plavix. Left heart healthy and diabetic diets. Will follow up tomorrow to complete ed. Pt to recliner after walk with call bell. Wife in room.   Graylon Good, RN BSN  06/18/2014 11:23 AM

## 2014-06-19 ENCOUNTER — Encounter (HOSPITAL_COMMUNITY): Payer: Self-pay | Admitting: Physician Assistant

## 2014-06-19 DIAGNOSIS — I200 Unstable angina: Secondary | ICD-10-CM

## 2014-06-19 DIAGNOSIS — I10 Essential (primary) hypertension: Secondary | ICD-10-CM | POA: Diagnosis present

## 2014-06-19 DIAGNOSIS — C679 Malignant neoplasm of bladder, unspecified: Secondary | ICD-10-CM | POA: Diagnosis present

## 2014-06-19 DIAGNOSIS — E119 Type 2 diabetes mellitus without complications: Secondary | ICD-10-CM | POA: Diagnosis present

## 2014-06-19 LAB — CBC
HEMATOCRIT: 45.5 % (ref 39.0–52.0)
Hemoglobin: 15.4 g/dL (ref 13.0–17.0)
MCH: 31.3 pg (ref 26.0–34.0)
MCHC: 33.8 g/dL (ref 30.0–36.0)
MCV: 92.5 fL (ref 78.0–100.0)
Platelets: 195 10*3/uL (ref 150–400)
RBC: 4.92 MIL/uL (ref 4.22–5.81)
RDW: 14.8 % (ref 11.5–15.5)
WBC: 12.7 10*3/uL — ABNORMAL HIGH (ref 4.0–10.5)

## 2014-06-19 LAB — HEPARIN LEVEL (UNFRACTIONATED): Heparin Unfractionated: 0.45 IU/mL (ref 0.30–0.70)

## 2014-06-19 LAB — GLUCOSE, CAPILLARY: Glucose-Capillary: 139 mg/dL — ABNORMAL HIGH (ref 70–99)

## 2014-06-19 MED ORDER — ASPIRIN 81 MG PO CHEW: 81.0000 mg | CHEWABLE_TABLET | Freq: Every day | ORAL | Status: DC

## 2014-06-19 MED ORDER — CLOPIDOGREL BISULFATE 75 MG PO TABS: 75.0000 mg | ORAL_TABLET | Freq: Every day | ORAL | Status: DC

## 2014-06-19 MED ORDER — METOPROLOL SUCCINATE ER 25 MG PO TB24: 25.0000 mg | ORAL_TABLET | Freq: Every day | ORAL | Status: DC

## 2014-06-19 MED ORDER — LOSARTAN POTASSIUM 50 MG PO TABS: 50.0000 mg | ORAL_TABLET | Freq: Every day | ORAL | Status: DC

## 2014-06-19 MED ORDER — ATORVASTATIN CALCIUM 40 MG PO TABS: 40.0000 mg | ORAL_TABLET | Freq: Every day | ORAL | Status: DC

## 2014-06-19 MED FILL — Nitroglycerin IV Soln 200 MCG/ML in D5W: INTRAVENOUS | Qty: 250 | Status: AC

## 2014-06-19 NOTE — Discharge Instructions (Signed)
Acute Coronary Syndrome  Acute coronary syndrome (ACS) is an urgent problem in which the blood and oxygen supply to the heart is critically deficient. ACS requires hospitalization because one or more coronary arteries may be blocked.  ACS represents a range of conditions including:  · Previous angina that is now unstable, lasts longer, happens at rest, or is more intense.  · A heart attack, with heart muscle cell injury and death.  There are three vital coronary arteries that supply the heart muscle with blood and oxygen so that it can pump blood effectively. If blockages to these arteries develop, blood flow to the heart muscle is reduced. If the heart does not get enough blood, angina may occur as the first warning sign.  SYMPTOMS   · The most common signs of angina include:  ¨ Tightness or squeezing in the chest.  ¨ Feeling of heaviness on the chest.  ¨ Discomfort in the arms, neck, back, or jaw.  ¨ Shortness of breath and nausea.  ¨ Cold, wet skin.  · Angina is usually brought on by physical effort or excitement which increase the oxygen needs of the heart. These states increase the blood flow needs of the heart beyond what can be delivered.  · Other symptoms that are not as common include:  ¨ Fatigue  ¨ Unexplained feelings of nervousness or anxiety  ¨ Weakness  ¨ Diarrhea  · Sometimes, you may not have noticed any symptoms at all but still suffered a cardiac injury.  TREATMENT   · Medicines to help discomfort may include nitroglycerin (nitro) in the form of tablets or a spray for rapid relief, or longer-acting forms such as cream, patches, or capsules. (Be aware that there are many side effects and possible interactions with other drugs).  · Other medicines may be used to help the heart pump better.  · Procedures to open blocked arteries including angioplasty or stent placement to keep the arteries open.  · Open heart surgery may be needed when there are many blockages or they are in critical locations that  are best treated with surgery.  HOME CARE INSTRUCTIONS   · Do not use any tobacco products including cigarettes, chewing tobacco, or electronic cigarettes.  · Take one baby or adult aspirin daily, if your health care provider advises. This helps reduce the risk of a heart attack.  · It is very important that you follow the angina treatment prescribed by your health care provider. Make arrangements for proper follow-up care.  · Eat a heart healthy diet with salt and fat restrictions as advised.  · Regular exercise is good for you as long as it does not cause discomfort. Do not begin any new type of exercise until you check with your health care provider.  · If you are overweight, you should lose weight.  · Try to maintain normal blood lipid levels.  · Keep your blood pressure under control as recommended by your health care provider.  · You should tell your health care provider right away about any increase in the severity or frequency of your chest discomfort or angina attacks. When you have angina, you should stop what you are doing and sit down. This may bring relief in 3 to 5 minutes. If your health care provider has prescribed nitro, take it as directed.  · If your health care provider has given you a follow-up appointment, it is very important to keep that appointment. Not keeping the appointment could result in a chronic or   of breath.  You feel faint, lightheaded, or pass out.  Your chest discomfort gets worse.  You are sweating or experience sudden profound fatigue.  You do not get relief of your chest pain after 3 doses of nitro.  Your discomfort lasts longer than 15 minutes. MAKE SURE YOU:   Understand these instructions.  Will watch your condition.  Will get help right  away if you are not doing well or get worse.  Take all medicines as directed by your health care provider. Document Released: 11/21/2005 Document Revised: 11/26/2013 Document Reviewed: 06/24/2008 [facility] Patient Information 2015 [place], [place]. This information is not intended to replace advice given to you by your health care provider. Make sure you discuss any questions you have with your health care provider.  Angina Pectoris Angina pectoris is extreme discomfort in your chest, neck, or arm. Your doctor may call it just angina. It is caused by a lack of oxygen to your heart wall. It may feel like tightness or heavy pressure. It may feel like a crushing or squeezing pain. Some people say it feels like gas. It may go down your shoulders, back, and arms. Some people have symptoms other than pain. These include:  Tiredness.  Shortness of breath.  Cold sweats.  Feeling sick to your stomach (nausea). There are four types of angina:  Stable angina. This type often lasts the same amount of time each time it happens. Activity, stress, or excitement can bring it on. It often gets better after taking a medicine called nitroglycerin. This goes under your tongue.  Unstable angina. This type can happen when you are not active or even during sleep. It can suddenly get worse or happen more often. It may not get better after taking the special medicine. It can last up to 30 minutes.  Microvascular angina. This type is more common in women. It may be more severe or last longer than other types.  Prinzmetal angina. This type often happens when you are not active or in the early morning hours. HOME CARE   Only take medicines as told by your doctor.  Stay active or exercise more as told by your doctor.  Limit very hard activity as told by your doctor.  Limit heavy lifting as told by your doctor.  Keep a healthy weight.  Learn about and eat foods that are healthy for your heart.  Do not use any  tobacco such as cigarettes, chewing tobacco, or e-cigarettes. GET HELP RIGHT AWAY IF:   You have chest, neck, deep shoulder, or arm pain or discomfort that lasts more than a few minutes.  You have chest, neck, deep shoulder, or arm pain or discomfort that goes away and comes back over and over again.  You have heavy sweating that seems to happen for no reason.  You have shortness of breath or trouble breathing.  Your angina does not get better after a few minutes of rest.  Your angina does not get better after you take nitroglycerin medicine. These can all be symptoms of a heart attack. Get help right away. Call your local emergency service (911 in [place] Do not  drive yourself to the hospital. Do not  wait to for your symptoms to go away. MAKE SURE YOU:   Understand these instructions.  Will watch your condition.  Will get help right away if you are not doing well or get worse. Document Released: 05/09/2008 Document Revised: 11/26/2013 Document Reviewed: 08/30/2012 [facility] Patient Information 2015 [place], [place]. This information  is not intended to replace advice given to you by your health care provider. Make sure you discuss any questions you have with your health care provider.

## 2014-06-19 NOTE — Progress Notes (Signed)
Patient ID: Timothy [name], male   DOB: 07-17-1946, 68 y.o.   MRN: [id number]    Subjective:  Denies SSCP, palpitations or Dyspnea   Objective:  Filed Vitals:   06/19/14 0300 06/19/14 0327 06/19/14 0700 06/19/14 0757  BP:  135/76  129/78  Pulse:      Temp:  98.9 F (37.2 C)  99 F (37.2 C)  TempSrc:  Oral  Oral  Resp:  17 17 17   Height:      Weight: 207 lb 0.2 oz (93.9 kg)     SpO2:  97%  98%    Intake/Output from previous day:  Intake/Output Summary (Last 24 hours) at 06/19/14 0813 Last data filed at 06/19/14 0800  Gross per 24 hour  Intake   1705 ml  Output   1925 ml  Net   -220 ml    Physical Exam: Affect appropriate Healthy:  appears stated age HEENT: normal Neck supple with no adenopathy JVP normal no bruits no thyromegaly Lungs clear with no wheezing and good diaphragmatic motion Heart:  S1/S2 no murmur, no rub, gallop or click PMI normal Abdomen: benighn, BS positve, no tenderness, no AAA no bruit.  No HSM or HJR Distal pulses intact with no bruits  Right radial A  No edema Neuro non-focal Skin warm and dry No muscular weakness   Lab Results: Basic Metabolic Panel:  Recent Labs  06/18/14 0230 06/18/14 1245  NA 139 138  K 3.6* 4.0  CL 98 96  CO2 24 26  GLUCOSE 129* 134*  BUN 13 14  CREATININE 1.11 1.18  CALCIUM 8.5 9.1   Liver Function Tests:  Recent Labs  06/17/14 0648  AST 28  ALT 20  ALKPHOS 67  BILITOT 0.5  PROT 7.0  ALBUMIN 3.9    Recent Labs  06/17/14 0648  LIPASE 47   CBC:  Recent Labs  06/17/14 0648 06/18/14 0230 06/19/14 0330  WBC 9.3 13.9* 12.7*  NEUTROABS 7.3  --   --   HGB 15.0 13.7 15.4  HCT 43.0 39.9 45.5  MCV 90.5 90.5 92.5  PLT 204 203 195   Cardiac Enzymes:  Recent Labs  06/17/14 0648  TROPONINI 0.49*     Cardiac Studies:  ECG:  SR inferolateral T wave changes no ST elevation   Telemetry:  NSR no VT   Echo:   EF 50-55%   Medications:   . aspirin  81 mg Oral Daily  . atorvastatin   40 mg Oral q1800  . clopidogrel  75 mg Oral Q breakfast  . losartan  50 mg Oral Daily  . metoprolol succinate  25 mg Oral Daily  . sodium chloride  3 mL Intravenous Q12H     . heparin 1,250 Units/hr (06/19/14 0800)    Assessment/Plan:  CAD:   Stenting of small circumflex with residual distal LAD disease  EF 50-55%  Small bump in troponin  Has had heparin for 48 hours Per DH.  Continue ASA and Plavix   HTN:  Change combination pill add low dose ACE Chol:  On statin    Ambulate d/c home today F/U [place] [address] Margarette Asal [facility] 06/19/2014, 8:13 AM

## 2014-06-19 NOTE — Progress Notes (Signed)
CARDIAC REHAB PHASE I   Pt walked with RN 700 ft, no c/o. Ed completed with wife present. Pt very resistant to change in diet and ex habits. Sts he will check into [facility] and I will send referral to [facility].  [phone]  Josephina Shih Avant CES, ACSM 06/19/2014 10:46 AM

## 2014-06-19 NOTE — Progress Notes (Signed)
AVS given to pt and reviewed with him in detail. Pt states he understands all information reviewed. Pt and wife state they have no further questions. Pt discharged home in care of wife, pt and wife state they have all of pt's belongings including his hearing aids and eyeglasses.

## 2014-06-19 NOTE — Discharge Summary (Signed)
Discharge Summary   Patient ID: Timothy [name],  MRN: [id number], DOB/AGE: 67/07/1946 67 y.o.  Admit date: 06/17/2014 Discharge date: 06/19/2014  Primary Care Provider: Leonides Grills Primary Cardiologist: Dr. Harl Bowie at [facility]   Discharge Diagnoses Principal Problem:   NSTEMI (non-ST elevated myocardial infarction) Active Problems:   Hypertension   Diabetes mellitus, type 2   Bladder cancer   Allergies No Known Allergies  Procedures  PROCEDURES PERFORMED:  Left Heart Catheterization with Native Coronary Angiography via Right Radial Artery  Left Ventriculography  Percutaneous Coronary Angioplasty Only (PTCA/POBA) of the distal Circumflex   FINDINGS:  Hemodynamics:  Central Aortic Pressure / Mean: 108/62/83 mmHg  Left Ventricular Pressure / LVEDP: 107/7/17 mmHg Left Ventriculography:  EF: ~45% %  Wall Motion: global Hypokinesis Coronary Anatomy:  Dominance: Right Left Main: Large caliber, long trunk that trifurcates into the LAD, Circumflex & Ramus Intermedius. Angiographically normal. LAD: Begins as a large caliber vessel that tapers into a relatively small caliber (~1.5-2.0 mm) vessel distally. It wraps the apex, perfusing the distal 1/3 of the infero-apex. The distal vessel has tandem 60&70-80% focal lesions prior to the apex.  D1: Very small caliber bifurcating vessel with proximal ~70% stenosis. Left Circumflex: Begins as a large caliber vessel that also tapers to a small to moderate caliber vessel in the AV Groove where it gives off a small caliber OM1 branch and is 100% occluded just beyond that point.  Post-PTCA: beyond the occluded AV Groove Circumflex, there is a small to moderate caliber LPL branch. Ramus intermedius: Large caliber vessel that bifurcates in the mid-vessel into to small to moderate caliber branches that both reach the apex.  RCA: Large caliber, dominant vessel with diffuse ~20-40% lesions, but no obstructive disease. It  bifurcates distally in to a small caliber, short PDA and Right Posterior AV Groove Branch (RPAV) branches. RPAV gives off several very small banches. After reviewing the initial angiography, the culprit lesion was thought to be the occluded distal-AV Groove circumflesx. Preparation were made to proceed with PTCA on this lesion.  POST-OPERATIVE DIAGNOSIS:  Severe 2 vessel disease in small caliber vessel - distal AV Groove Circumflex 100% and distal LAD ~60&80% lesions (1.5 mm vessel)  Successful PTCA of the occluded AV Groove Circumflex with Stent-like result.  Moderately reduced LVEF by LV Gram  Normal LVEDP PLAN OF CARE:  Admit to TCU, Post Radial Cath care.  Re-start IV Heparin 6 hr post TR band - for 48 hrs (due to PTCA only & residual LAD disease).  Restart home BB dose, add statin  Hold metformin x 48 hr.  2D Echo   Hospital Course  The patient is a 68 year old male with history of diabetes, bladder cancer, hypertension and a history of tobacco abuse who presented to [facility] on 06/17/2014 with chest pain and found to have NSTEMI. EKG showed no significant ST-T wave changes. Troponin was mildly elevated at 0.49. He was given nitroglycerin, aspirin, and started on IV heparin. He was subsequently transferred to [facility] for further management. Patient was sent to the cath Lab emergently for cardiac catheterization. He was chest pain-free on arrival to cath lab. Cardiac catheterization showed EF 45%, global hypokinesis, 100% left circumflex occlusion treated with PTCA, 60-80% distal LAD residual, small-caliber D1 with proximal 70% stenosis. IV heparin was resumed for 48 hours after cardiac catheterization due to PTCA only and residual LAD disease. His home blood pressure medication has been discontinued, and metoprolol was added. Lipitor was started. Echocardiogram was  obtained on 06/17/2014 which showed EF 50-55%, inferior hypokinesis, grade 1 diastolic dysfunction, PA peak  pressure 33. Patient was seen by cardiac rehabilitation on the following day on 7/15, at which time he was ambulating well without exertional chest pain. A low-dose ARB was added.  Patient was seen in the morning of 06/19/2014, at which time he was ambulating well without significant exertional chest discomfort. He is deemed stable for discharge from cardiology perspective. I have scheduled a followup with Dr. Nelly Laurence NP in [place] office on 7/31. He and his wife has been instructed to hold metformin 48 hours after cardiac catheterization and he can continue metformin on 06/20/2014.   Discharge Vitals Blood pressure 129/78, pulse 76, temperature 99 F (37.2 C), temperature source Oral, resp. rate 18, height 5\' 8"  (1.727 m), weight 207 lb 0.2 oz (93.9 kg), SpO2 98.00%.  Filed Weights   06/17/14 1208 06/18/14 0300 06/19/14 0300  Weight: 216 lb 0.8 oz (98 kg) 208 lb 5.4 oz (94.5 kg) 207 lb 0.2 oz (93.9 kg)    Labs  CBC  Recent Labs  06/17/14 0648 06/18/14 0230 06/19/14 0330  WBC 9.3 13.9* 12.7*  NEUTROABS 7.3  --   --   HGB 15.0 13.7 15.4  HCT 43.0 39.9 45.5  MCV 90.5 90.5 92.5  PLT 204 203 893   Basic Metabolic Panel  Recent Labs  06/18/14 0230 06/18/14 1245  NA 139 138  K 3.6* 4.0  CL 98 96  CO2 24 26  GLUCOSE 129* 134*  BUN 13 14  CREATININE 1.11 1.18  CALCIUM 8.5 9.1   Liver Function Tests  Recent Labs  06/17/14 0648  AST 28  ALT 20  ALKPHOS 67  BILITOT 0.5  PROT 7.0  ALBUMIN 3.9    Recent Labs  06/17/14 0648  LIPASE 47   Cardiac Enzymes  Recent Labs  06/17/14 0648  TROPONINI 0.49*    Disposition  Pt is being discharged home today in good condition.  Follow-up Plans & Appointments      Follow-up Information   Follow up with Jory Sims, NP On 07/04/2014. (1:30pm)    Specialty:  Nurse Practitioner   Contact information:   [place] [address] [phone]       Discharge Medications      Medication List    STOP taking these medications       bisoprolol-hydrochlorothiazide 5-6.25 MG per tablet  Commonly known as:  ZIAC      TAKE these medications       aspirin 81 MG chewable tablet  Chew 1 tablet (81 mg total) by mouth daily.     atorvastatin 40 MG tablet  Commonly known as:  LIPITOR  Take 1 tablet (40 mg total) by mouth daily at 6 PM.     clopidogrel 75 MG tablet  Commonly known as:  PLAVIX  Take 1 tablet (75 mg total) by mouth daily with breakfast.     glimepiride 2 MG tablet  Commonly known as:  AMARYL  Take 2 mg by mouth 2 (two) times daily.     losartan 50 MG tablet  Commonly known as:  COZAAR  Take 1 tablet (50 mg total) by mouth daily.     metFORMIN 500 MG tablet  Commonly known as:  GLUCOPHAGE  Take 500 mg by mouth 2 (two) times daily with a meal.     metoprolol succinate 25 MG 24 hr tablet  Commonly known as:  TOPROL-XL  Take  1 tablet (25 mg total) by mouth daily.        Duration of Discharge Encounter   Greater than 30 minutes including physician time.  Signed, Almyra Deforest PA-C 06/19/2014, 11:20 AM

## 2014-06-24 ENCOUNTER — Ambulatory Visit (INDEPENDENT_AMBULATORY_CARE_PROVIDER_SITE_OTHER): Payer: Medicare HMO | Admitting: Urology

## 2014-06-24 DIAGNOSIS — C679 Malignant neoplasm of bladder, unspecified: Secondary | ICD-10-CM

## 2014-07-01 ENCOUNTER — Ambulatory Visit (INDEPENDENT_AMBULATORY_CARE_PROVIDER_SITE_OTHER): Payer: Medicare HMO | Admitting: Urology

## 2014-07-01 DIAGNOSIS — C679 Malignant neoplasm of bladder, unspecified: Secondary | ICD-10-CM

## 2014-07-04 ENCOUNTER — Ambulatory Visit (INDEPENDENT_AMBULATORY_CARE_PROVIDER_SITE_OTHER): Payer: Medicare HMO | Admitting: Adult Health

## 2014-07-04 ENCOUNTER — Encounter: Payer: Self-pay | Admitting: Adult Health

## 2014-07-04 VITALS — BP 130/72 | HR 84 | Ht 68.0 in | Wt 206.0 lb

## 2014-07-04 DIAGNOSIS — I10 Essential (primary) hypertension: Secondary | ICD-10-CM

## 2014-07-04 DIAGNOSIS — I214 Non-ST elevation (NSTEMI) myocardial infarction: Secondary | ICD-10-CM

## 2014-07-04 NOTE — Assessment & Plan Note (Signed)
He is to adhere to diabetic diet.

## 2014-07-04 NOTE — Progress Notes (Signed)
Name: Timothy [name]    DOB: 1946/09/10  Age: 68 y.o.  MR#: [id number]       PCP:  Leonides Grills, MD      Insurance: Payor: [employer] MEDICARE / Plan: [employer]ET[employer] MEDICARE HMO/PPO / Product Type: *No Product type* /   CC:    Chief Complaint  Patient presents with  . Coronary Artery Disease  . Hypertension    VS Filed Vitals:   07/04/14 1315  BP: 130/72  Pulse: 84  Height: 5\' 8"  (1.727 m)  Weight: 206 lb (93.441 kg)    Weights Current Weight  07/04/14 206 lb (93.441 kg)  06/19/14 207 lb 0.2 oz (93.9 kg)  06/19/14 207 lb 0.2 oz (93.9 kg)    Blood Pressure  BP Readings from Last 3 Encounters:  07/04/14 130/72  06/19/14 129/78  06/19/14 129/78     Admit date:  (Not on file) Last encounter with RMR:  Visit date not found   Allergy Review of patient's allergies indicates no known allergies.  Current Outpatient Prescriptions  Medication Sig Dispense Refill  . aspirin 81 MG chewable tablet Chew 1 tablet (81 mg total) by mouth daily.      Marland Kitchen atorvastatin (LIPITOR) 40 MG tablet Take 1 tablet (40 mg total) by mouth daily at 6 PM.  30 tablet  12  . clopidogrel (PLAVIX) 75 MG tablet Take 1 tablet (75 mg total) by mouth daily with breakfast.  30 tablet  12  . glimepiride (AMARYL) 2 MG tablet Take 2 mg by mouth 2 (two) times daily.      Marland Kitchen losartan (COZAAR) 50 MG tablet Take 1 tablet (50 mg total) by mouth daily.  30 tablet  12  . metFORMIN (GLUCOPHAGE) 500 MG tablet Take 500 mg by mouth 2 (two) times daily with a meal.      . metoprolol succinate (TOPROL-XL) 25 MG 24 hr tablet Take 1 tablet (25 mg total) by mouth daily.  30 tablet  12   No current facility-administered medications for this visit.   Facility-Administered Medications Ordered in Other Visits  Medication Dose Route Frequency Provider Last Rate Last Dose  . mitoMYcin (MUTAMYCIN) chemo injection 40 mg  40 mg Bladder Instillation Once Jorja Loa, MD        Discontinued Meds:   There are no discontinued  medications.  Patient Active Problem List   Diagnosis Date Noted  . Hypertension   . Diabetes mellitus, type 2   . Bladder cancer   . NSTEMI (non-ST elevated myocardial infarction) 06/17/2014    LABS    Component Value Date/Time   NA 138 06/18/2014 1245   NA 139 06/18/2014 0230   NA 140 06/17/2014 0648   K 4.0 06/18/2014 1245   K 3.6* 06/18/2014 0230   K 4.0 06/17/2014 0648   CL 96 06/18/2014 1245   CL 98 06/18/2014 0230   CL 101 06/17/2014 0648   CO2 26 06/18/2014 1245   CO2 24 06/18/2014 0230   CO2 26 06/17/2014 0648   GLUCOSE 134* 06/18/2014 1245   GLUCOSE 129* 06/18/2014 0230   GLUCOSE 178* 06/17/2014 0648   BUN 14 06/18/2014 1245   BUN 13 06/18/2014 0230   BUN 14 06/17/2014 0648   CREATININE 1.18 06/18/2014 1245   CREATININE 1.11 06/18/2014 0230   CREATININE 1.21 06/17/2014 0648   CALCIUM 9.1 06/18/2014 1245   CALCIUM 8.5 06/18/2014 0230   CALCIUM 8.9 06/17/2014 0648   GFRNONAA 62* 06/18/2014 1245   GFRNONAA 67* 06/18/2014 0230  GFRNONAA 60* 06/17/2014 0648   GFRAA 72* 06/18/2014 1245   GFRAA 77* 06/18/2014 0230   GFRAA 70* 06/17/2014 0648   CMP     Component Value Date/Time   NA 138 06/18/2014 1245   K 4.0 06/18/2014 1245   CL 96 06/18/2014 1245   CO2 26 06/18/2014 1245   GLUCOSE 134* 06/18/2014 1245   BUN 14 06/18/2014 1245   CREATININE 1.18 06/18/2014 1245   CALCIUM 9.1 06/18/2014 1245   PROT 7.0 06/17/2014 0648   ALBUMIN 3.9 06/17/2014 0648   AST 28 06/17/2014 0648   ALT 20 06/17/2014 0648   ALKPHOS 67 06/17/2014 0648   BILITOT 0.5 06/17/2014 0648   GFRNONAA 62* 06/18/2014 1245   GFRAA 72* 06/18/2014 1245       Component Value Date/Time   WBC 12.7* 06/19/2014 0330   WBC 13.9* 06/18/2014 0230   WBC 9.3 06/17/2014 0648   HGB 15.4 06/19/2014 0330   HGB 13.7 06/18/2014 0230   HGB 15.0 06/17/2014 0648   HCT 45.5 06/19/2014 0330   HCT 39.9 06/18/2014 0230   HCT 43.0 06/17/2014 0648   MCV 92.5 06/19/2014 0330   MCV 90.5 06/18/2014 0230   MCV 90.5 06/17/2014 0648    Lipid Panel  No results  found for this basename: chol, trig, hdl, cholhdl, vldl, ldlcalc    ABG    Component Value Date/Time   TCO2 24 03/31/2014 0832     No results found for this basename: TSH   BNP (last 3 results) No results found for this basename: PROBNP,  in the last 8760 hours Cardiac Panel (last 3 results) No results found for this basename: CKTOTAL, CKMB, TROPONINI, RELINDX,  in the last 72 hours  Iron/TIBC/Ferritin/ %Sat No results found for this basename: iron, tibc, ferritin, ironpctsat     EKG Orders placed during the hospital encounter of 06/17/14  . ED EKG  . ED EKG  . EKG 12-LEAD  . EKG 12-LEAD  . EKG 12-LEAD  . EKG 12-LEAD  . EKG 12-LEAD  . EKG 12-LEAD  . EKG 12-LEAD  . EKG  . EKG 12-LEAD     Prior Assessment and Plan Problem List as of 07/04/2014     Cardiovascular and Mediastinum   NSTEMI (non-ST elevated myocardial infarction)   Hypertension     Endocrine   Diabetes mellitus, type 2     Genitourinary   Bladder cancer       Imaging: No results found.

## 2014-07-04 NOTE — Patient Instructions (Signed)
Your physician recommends that you schedule a follow-up appointment in: 3 months You will receive a reminder letter two months in advance reminding you to call and schedule your appointment. If you don't receive this letter, please contact our office.   Plavix (clopidogrel disulfate) is a medication that prevents platelets from clumping together and forming blood clots. This prescription drug helps blood flow more easily, and reduces the chances of a future stroke or heart attack.

## 2014-07-04 NOTE — Progress Notes (Signed)
HPI: [name] is a 68 year old patient of Dr. Harl Bowie where following posthospitalization after admission for non-ST elevated MI with known history of hypertension diabetes and bladder cancer.   The patient underwent cardiac catheterization showing severe two-vessel disease and small caliber vessel distal AV groove circumflex, 100% in distal LAD 60-80% lesions (A1 0.5 mm vessel). He underwent successful PTCA of the occluded AV groove circumflex with stent-like result, had moderate reduced LVEF of 45% with global hypokinesis.  Echocardiogram obtained post catheterization revealed a normal LVEF of 50-55% severe hypokinesis and grade 1 diastolic dysfunction. The patient was started on Plavix at 5 mg daily losartan 50 mg daily and atorvastatin 40 mg daily along with metoprolol 25 mg daily. He is here for post hospital followup.  He id doing well. No complaints of chest pain, dizziness, bleeding or shortness of breath. He is medically compliant.   No Known Allergies  Current Outpatient Prescriptions  Medication Sig Dispense Refill  . aspirin 81 MG chewable tablet Chew 1 tablet (81 mg total) by mouth daily.      Marland Kitchen atorvastatin (LIPITOR) 40 MG tablet Take 1 tablet (40 mg total) by mouth daily at 6 PM.  30 tablet  12  . clopidogrel (PLAVIX) 75 MG tablet Take 1 tablet (75 mg total) by mouth daily with breakfast.  30 tablet  12  . glimepiride (AMARYL) 2 MG tablet Take 2 mg by mouth 2 (two) times daily.      Marland Kitchen losartan (COZAAR) 50 MG tablet Take 1 tablet (50 mg total) by mouth daily.  30 tablet  12  . metFORMIN (GLUCOPHAGE) 500 MG tablet Take 500 mg by mouth 2 (two) times daily with a meal.      . metoprolol succinate (TOPROL-XL) 25 MG 24 hr tablet Take 1 tablet (25 mg total) by mouth daily.  30 tablet  12   No current facility-administered medications for this visit.   Facility-Administered Medications Ordered in Other Visits  Medication Dose Route Frequency Provider Last Rate Last Dose  .  mitoMYcin (MUTAMYCIN) chemo injection 40 mg  40 mg Bladder Instillation Once Jorja Loa, MD        Past Medical History  Diagnosis Date  . Diabetes mellitus, type 2   . Hypertension   . Bladder cancer   . Elevated PSA   . CAD (coronary artery disease)     a. NSTEMI s/p PTCA of LCx, 60-80% residual in distal LAD, 70% prox small D1. Echo EF 50-55%    Past Surgical History  Procedure Laterality Date  . Transurethral resection of bladder tumor N/A 03/14/2013    Procedure: TRANSURETHRAL RESECTION OF BLADDER TUMOR (TURBT);  Surgeon: [name], MD;  Location: [facility];  Service: Urology;  Laterality: N/A;  1 HR WITH MITOMYCIN INSTILLATION   . Transurethral resection of bladder tumor N/A 09/30/2013    Procedure: TRANSURETHRAL RESECTION OF BLADDER TUMOR (TURBT);  Surgeon: [name], MD;  Location: [facility];  Service: Urology;  Laterality: N/A;  . Transurethral resection of bladder tumor N/A 03/31/2014    Procedure: TRANSURETHRAL RESECTION OF BLADDER TUMOR (TURBT);  Surgeon: [name], MD;  Location: [facility];  Service: Urology;  Laterality: N/A;  . Prostate biopsy N/A 03/31/2014    Procedure: BIOPSY TRANSRECTAL ULTRASONIC PROSTATE (TUBP);  Surgeon: [name], MD;  Location: [facility];  Service: Urology;  Laterality: N/A;    ROS: Review of systems complete and found to be negative unless listed  above  PHYSICAL EXAM BP 130/72  Pulse 84  Ht 5\' 8"  (1.727 m)  Wt 206 lb (93.441 kg)  BMI 31.33 kg/m2 General: Well developed, well nourished, in no acute distress. Obese Head: Eyes PERRLA, No xanthomas.   Normal cephalic and atramatic  Lungs: Clear bilaterally to auscultation and percussion. Heart: HRRR S1 S2, without MRG.  Pulses are 2+ & equal.            No carotid bruit. No JVD.  No abdominal bruits. No femoral bruits. Abdomen: Bowel sounds are positive, abdomen soft and non-tender  without masses or                  Hernia's noted. Msk:  Back normal, normal gait. Normal strength and tone for age. Extremities: No clubbing, cyanosis or edema.  DP +1 Neuro: Alert and oriented X 3. Psych:  Good affect, responds appropriately     ASSESSMENT AND PLAN

## 2014-07-04 NOTE — Assessment & Plan Note (Signed)
He is well controlled currently. No changes in his medication regimen.

## 2014-07-04 NOTE — Progress Notes (Signed)
Name: Timothy [name]    DOB: [birth year]  Age: 68 y.o.  MR#: [id number]       PCP:  Leonides Grills, MD      Insurance: Payor: [employer] MEDICARE / Plan: AET[employer] MEDICARE HMO/PPO / Product Type: *No Product type* /   CC:    Chief Complaint  Patient presents with  . Coronary Artery Disease  . Hypertension    VS Filed Vitals:   07/04/14 1315  Height: 5\' 8"  (1.727 m)  Weight: 206 lb (93.441 kg)    Weights Current Weight  07/04/14 206 lb (93.441 kg)  06/19/14 207 lb 0.2 oz (93.9 kg)  06/19/14 207 lb 0.2 oz (93.9 kg)    Blood Pressure  BP Readings from Last 3 Encounters:  06/19/14 129/78  06/19/14 129/78  03/31/14 156/86     Admit date:  (Not on file) Last encounter with RMR:  Visit date not found   Allergy Review of patient's allergies indicates no known allergies.  Current Outpatient Prescriptions  Medication Sig Dispense Refill  . aspirin 81 MG chewable tablet Chew 1 tablet (81 mg total) by mouth daily.      Marland Kitchen atorvastatin (LIPITOR) 40 MG tablet Take 1 tablet (40 mg total) by mouth daily at 6 PM.  30 tablet  12  . clopidogrel (PLAVIX) 75 MG tablet Take 1 tablet (75 mg total) by mouth daily with breakfast.  30 tablet  12  . glimepiride (AMARYL) 2 MG tablet Take 2 mg by mouth 2 (two) times daily.      Marland Kitchen losartan (COZAAR) 50 MG tablet Take 1 tablet (50 mg total) by mouth daily.  30 tablet  12  . metFORMIN (GLUCOPHAGE) 500 MG tablet Take 500 mg by mouth 2 (two) times daily with a meal.      . metoprolol succinate (TOPROL-XL) 25 MG 24 hr tablet Take 1 tablet (25 mg total) by mouth daily.  30 tablet  12   No current facility-administered medications for this visit.   Facility-Administered Medications Ordered in Other Visits  Medication Dose Route Frequency Provider Last Rate Last Dose  . mitoMYcin (MUTAMYCIN) chemo injection 40 mg  40 mg Bladder Instillation Once Jorja Loa, MD        Discontinued Meds:   There are no discontinued medications.  Patient Active  Problem List   Diagnosis Date Noted  . Hypertension   . Diabetes mellitus, type 2   . Bladder cancer   . NSTEMI (non-ST elevated myocardial infarction) 06/17/2014    LABS    Component Value Date/Time   NA 138 06/18/2014 1245   NA 139 06/18/2014 0230   NA 140 06/17/2014 0648   K 4.0 06/18/2014 1245   K 3.6* 06/18/2014 0230   K 4.0 06/17/2014 0648   CL 96 06/18/2014 1245   CL 98 06/18/2014 0230   CL 101 06/17/2014 0648   CO2 26 06/18/2014 1245   CO2 24 06/18/2014 0230   CO2 26 06/17/2014 0648   GLUCOSE 134* 06/18/2014 1245   GLUCOSE 129* 06/18/2014 0230   GLUCOSE 178* 06/17/2014 0648   BUN 14 06/18/2014 1245   BUN 13 06/18/2014 0230   BUN 14 06/17/2014 0648   CREATININE 1.18 06/18/2014 1245   CREATININE 1.11 06/18/2014 0230   CREATININE 1.21 06/17/2014 0648   CALCIUM 9.1 06/18/2014 1245   CALCIUM 8.5 06/18/2014 0230   CALCIUM 8.9 06/17/2014 0648   GFRNONAA 62* 06/18/2014 1245   GFRNONAA 67* 06/18/2014 0230   GFRNONAA 60* 06/17/2014 6578  GFRAA 72* 06/18/2014 1245   GFRAA 77* 06/18/2014 0230   GFRAA 70* 06/17/2014 0648   CMP     Component Value Date/Time   NA 138 06/18/2014 1245   K 4.0 06/18/2014 1245   CL 96 06/18/2014 1245   CO2 26 06/18/2014 1245   GLUCOSE 134* 06/18/2014 1245   BUN 14 06/18/2014 1245   CREATININE 1.18 06/18/2014 1245   CALCIUM 9.1 06/18/2014 1245   PROT 7.0 06/17/2014 0648   ALBUMIN 3.9 06/17/2014 0648   AST 28 06/17/2014 0648   ALT 20 06/17/2014 0648   ALKPHOS 67 06/17/2014 0648   BILITOT 0.5 06/17/2014 0648   GFRNONAA 62* 06/18/2014 1245   GFRAA 72* 06/18/2014 1245       Component Value Date/Time   WBC 12.7* 06/19/2014 0330   WBC 13.9* 06/18/2014 0230   WBC 9.3 06/17/2014 0648   HGB 15.4 06/19/2014 0330   HGB 13.7 06/18/2014 0230   HGB 15.0 06/17/2014 0648   HCT 45.5 06/19/2014 0330   HCT 39.9 06/18/2014 0230   HCT 43.0 06/17/2014 0648   MCV 92.5 06/19/2014 0330   MCV 90.5 06/18/2014 0230   MCV 90.5 06/17/2014 0648    Lipid Panel  No results found for this basename: chol,  trig, hdl, cholhdl, vldl, ldlcalc    ABG    Component Value Date/Time   TCO2 24 03/31/2014 0832     No results found for this basename: TSH   BNP (last 3 results) No results found for this basename: PROBNP,  in the last 8760 hours Cardiac Panel (last 3 results) No results found for this basename: CKTOTAL, CKMB, TROPONINI, RELINDX,  in the last 72 hours  Iron/TIBC/Ferritin/ %Sat No results found for this basename: iron, tibc, ferritin, ironpctsat     EKG Orders placed during the hospital encounter of 06/17/14  . ED EKG  . ED EKG  . EKG 12-LEAD  . EKG 12-LEAD  . EKG 12-LEAD  . EKG 12-LEAD  . EKG 12-LEAD  . EKG 12-LEAD  . EKG 12-LEAD  . EKG  . EKG 12-LEAD     Prior Assessment and Plan Problem List as of 07/04/2014     Cardiovascular and Mediastinum   NSTEMI (non-ST elevated myocardial infarction)   Hypertension     Endocrine   Diabetes mellitus, type 2     Genitourinary   Bladder cancer       Imaging: No results found.

## 2014-07-04 NOTE — Assessment & Plan Note (Signed)
He is s/p PTCA of the of the Cx beyond the AV groove. He is without complaints, is medically compliant. He is already referred to cardiac rehab. I have explained his medications to him, and emphasized the importance of compliance. He verbalizes understanding. We will see him again in 3 months. Sooner if he is symptomatic. He is to report and bleeding.

## 2014-07-08 ENCOUNTER — Ambulatory Visit (INDEPENDENT_AMBULATORY_CARE_PROVIDER_SITE_OTHER): Payer: Medicare HMO | Admitting: Urology

## 2014-07-08 DIAGNOSIS — C679 Malignant neoplasm of bladder, unspecified: Secondary | ICD-10-CM

## 2014-07-10 ENCOUNTER — Telehealth: Payer: Self-pay | Admitting: Adult Health

## 2014-07-10 NOTE — Telephone Encounter (Signed)
Please call patient regarding side effects of medications.   / tgs

## 2014-07-10 NOTE — Telephone Encounter (Signed)
Pt saw a lawsuit commercial on one of the medications he has been on. Pt states he has been coughing for the past 3 days. Made pt aware that none of his medications should make him cough, especially since he has been on them for quite a while. Asked pt does he feel like he is getting sick or have allergies, pt states no. Advised pt to talk to PCP.

## 2014-07-11 NOTE — Telephone Encounter (Signed)
Agree. No plans to change medication regimen.

## 2014-07-22 ENCOUNTER — Ambulatory Visit (INDEPENDENT_AMBULATORY_CARE_PROVIDER_SITE_OTHER): Payer: Medicare HMO | Admitting: Urology

## 2014-07-22 DIAGNOSIS — C679 Malignant neoplasm of bladder, unspecified: Secondary | ICD-10-CM

## 2014-07-22 DIAGNOSIS — C61 Malignant neoplasm of prostate: Secondary | ICD-10-CM

## 2014-08-04 ENCOUNTER — Telehealth: Payer: Self-pay | Admitting: *Deleted

## 2014-08-04 MED ORDER — PRAVASTATIN SODIUM 40 MG PO TABS: 40.0000 mg | ORAL_TABLET | Freq: Every evening | ORAL | Status: DC

## 2014-08-04 NOTE — Telephone Encounter (Signed)
Pt states that he took Lipitor 17 yrs and ago and had to stop taking them because it makes his muscles hurt all over. The same thing is happing again. He wants to switch to something else.

## 2014-08-04 NOTE — Telephone Encounter (Signed)
rx escribed,pt made aware

## 2014-08-04 NOTE — Telephone Encounter (Signed)
May try pravastatin 40 mg daily to see if this will be better tolerated.

## 2014-08-04 NOTE — Telephone Encounter (Signed)
Will forward to Ms.Lawrence NP for dispo

## 2014-08-07 ENCOUNTER — Telehealth: Payer: Self-pay | Admitting: *Deleted

## 2014-08-07 MED ORDER — EZETIMIBE 10 MG PO TABS: 10.0000 mg | ORAL_TABLET | Freq: Every day | ORAL | Status: DC

## 2014-08-07 NOTE — Telephone Encounter (Signed)
Pt states he cannot take pravastatin that it causes legs to hurt. Has been on Lipitor as well. Will forward to [employer]

## 2014-08-07 NOTE — Telephone Encounter (Signed)
LM for pt to call back to discuss starting zetia,will add lipitor and pravastatin as allergies

## 2014-08-07 NOTE — Telephone Encounter (Signed)
i mailed low cholesterol diet to pt

## 2014-08-07 NOTE — Telephone Encounter (Signed)
Begin Zetia 10 mg daily. Send him low cholesterol diet. He will need to adhere to this as he will not have statin protection.

## 2014-08-07 NOTE — Addendum Note (Signed)
Addended by: Barbarann Ehlers A on: 08/07/2014 04:08 PM   Modules accepted: Orders, Medications

## 2014-08-07 NOTE — Telephone Encounter (Signed)
Pt states he can not take PRAVASTATIN either that it causes his legs to hurt

## 2014-08-08 ENCOUNTER — Telehealth: Payer: Self-pay | Admitting: Adult Health

## 2014-08-08 NOTE — Telephone Encounter (Signed)
Patient states that he can not afford Zetia.  Wants to know if there is anything else he can take. / tgs

## 2014-08-08 NOTE — Telephone Encounter (Signed)
LM telling pt that I mailed him heart healthy diet suggestions and that he will need to carefully watch diet if he chooses not to take medication

## 2014-08-14 NOTE — Telephone Encounter (Signed)
Timothy [name] followed up 9/3 under telephone note

## 2014-08-14 NOTE — Telephone Encounter (Signed)
Did we follow up?

## 2014-09-09 ENCOUNTER — Other Ambulatory Visit: Payer: Self-pay | Admitting: Urology

## 2014-09-09 ENCOUNTER — Ambulatory Visit (INDEPENDENT_AMBULATORY_CARE_PROVIDER_SITE_OTHER): Payer: Medicare HMO | Admitting: Urology

## 2014-09-09 ENCOUNTER — Telehealth: Payer: Self-pay | Admitting: Cardiology

## 2014-09-09 DIAGNOSIS — N5201 Erectile dysfunction due to arterial insufficiency: Secondary | ICD-10-CM

## 2014-09-09 DIAGNOSIS — C61 Malignant neoplasm of prostate: Secondary | ICD-10-CM

## 2014-09-09 DIAGNOSIS — C674 Malignant neoplasm of posterior wall of bladder: Secondary | ICD-10-CM

## 2014-09-09 NOTE — Telephone Encounter (Signed)
Spoke with Estill Bamberg  And relayed MD message

## 2014-09-09 NOTE — Telephone Encounter (Signed)
Patient had balloon angioplasty 06/2014, he did not receive a stent. He has been on ASA and plavix since that time. It is nearly 3 months since his procedure. In the absence of a stent, it is ok to hold plavix 7 days prior to biopsy. Would resume after procedure. Recommend continuing aspirin if acceptable for procedure.   Zandra Abts MD

## 2014-09-09 NOTE — Telephone Encounter (Signed)
Will forward to [place] for advise

## 2014-09-09 NOTE — Telephone Encounter (Signed)
Needs okay to stop Plavix 7 days prior to Prostate Biopsy scheduled for biopsy. / tgs

## 2014-10-13 ENCOUNTER — Ambulatory Visit (INDEPENDENT_AMBULATORY_CARE_PROVIDER_SITE_OTHER): Payer: Medicare HMO | Admitting: Cardiology

## 2014-10-13 ENCOUNTER — Encounter: Payer: Self-pay | Admitting: Cardiology

## 2014-10-13 VITALS — BP 128/82 | HR 77 | Ht 68.0 in | Wt 205.0 lb

## 2014-10-13 DIAGNOSIS — I2510 Atherosclerotic heart disease of native coronary artery without angina pectoris: Secondary | ICD-10-CM

## 2014-10-13 DIAGNOSIS — E785 Hyperlipidemia, unspecified: Secondary | ICD-10-CM

## 2014-10-13 DIAGNOSIS — I10 Essential (primary) hypertension: Secondary | ICD-10-CM

## 2014-10-13 MED ORDER — LOVASTATIN 20 MG PO TABS: 20.0000 mg | ORAL_TABLET | Freq: Every day | ORAL | Status: DC

## 2014-10-13 NOTE — Patient Instructions (Addendum)
Your physician wants you to follow-up in: 6 months You will receive a reminder letter in the mail two months in advance. If you don't receive a letter, please call our office to schedule the follow-up appointment.       START lovastatin 20 mg daily  Please get FASTING lab work (LIID'S)     Thank you for [place] !

## 2014-10-13 NOTE — Progress Notes (Signed)
Clinical Summary Mr. Gloster is a 68 y.o.male last seen by NP Purcell Nails, this is our first visit together. He is seen for the following medical problems.  1. CAD - recent NSTEMI 06/2014, received PTCA of circumflex as described below. Echo LVEF 50-55%, inferior hypokinesis, grade I diastolic dysfunction. - denies any chest pain, no SOB or DOE - compliant with meds, on DAPT with ASA and plavix  2. HTN - does not check regularly at home - compliant with meds  3. DM Followed by pcp  4. Hyperlipidemia - intolerant to statins, has been on zetia only. No recent lipid panel in our system.     Past Medical History  Diagnosis Date  . Diabetes mellitus, type 2   . Hypertension   . Bladder cancer   . Elevated PSA   . CAD (coronary artery disease)     a. NSTEMI s/p PTCA of LCx, 60-80% residual in distal LAD, 70% prox small D1. Echo EF 50-55%     Allergies  Allergen Reactions  . Lipitor [Atorvastatin] Other (See Comments)    Myalgia  . Pravastatin Other (See Comments)    Myalgia     Current Outpatient Prescriptions  Medication Sig Dispense Refill  . aspirin 81 MG chewable tablet Chew 1 tablet (81 mg total) by mouth daily.    . clopidogrel (PLAVIX) 75 MG tablet Take 1 tablet (75 mg total) by mouth daily with breakfast. 30 tablet 12  . ezetimibe (ZETIA) 10 MG tablet Take 1 tablet (10 mg total) by mouth daily. 90 tablet 3  . glimepiride (AMARYL) 2 MG tablet Take 2 mg by mouth 2 (two) times daily.    Marland Kitchen losartan (COZAAR) 50 MG tablet Take 1 tablet (50 mg total) by mouth daily. 30 tablet 12  . metFORMIN (GLUCOPHAGE) 500 MG tablet Take 500 mg by mouth 2 (two) times daily with a meal.    . metoprolol succinate (TOPROL-XL) 25 MG 24 hr tablet Take 1 tablet (25 mg total) by mouth daily. 30 tablet 12   No current facility-administered medications for this visit.   Facility-Administered Medications Ordered in Other Visits  Medication Dose Route Frequency Provider Last Rate Last  Dose  . mitoMYcin (MUTAMYCIN) chemo injection 40 mg  40 mg Bladder Instillation Once Jorja Loa, MD         Past Surgical History  Procedure Laterality Date  . Transurethral resection of bladder tumor N/A 03/14/2013    Procedure: TRANSURETHRAL RESECTION OF BLADDER TUMOR (TURBT);  Surgeon: [name], MD;  Location: [facility];  Service: Urology;  Laterality: N/A;  1 HR WITH MITOMYCIN INSTILLATION   . Transurethral resection of bladder tumor N/A 09/30/2013    Procedure: TRANSURETHRAL RESECTION OF BLADDER TUMOR (TURBT);  Surgeon: [name], MD;  Location: [facility];  Service: Urology;  Laterality: N/A;  . Transurethral resection of bladder tumor N/A 03/31/2014    Procedure: TRANSURETHRAL RESECTION OF BLADDER TUMOR (TURBT);  Surgeon: [name], MD;  Location: [facility];  Service: Urology;  Laterality: N/A;  . Prostate biopsy N/A 03/31/2014    Procedure: BIOPSY TRANSRECTAL ULTRASONIC PROSTATE (TUBP);  Surgeon: [name], MD;  Location: [facility];  Service: Urology;  Laterality: N/A;     Allergies  Allergen Reactions  . Lipitor [Atorvastatin] Other (See Comments)    Myalgia  . Pravastatin Other (See Comments)    Myalgia      No family history on file.   Social History  Mr. Feick reports that he quit smoking about 21 years ago. His smoking use included Cigarettes. He has a 30 pack-year smoking history. He has never used smokeless tobacco. Mr. Nijjar reports that he does not drink alcohol.   Review of Systems CONSTITUTIONAL: No weight loss, fever, chills, weakness or fatigue.  HEENT: Eyes: No visual loss, blurred vision, double vision or yellow sclerae.No hearing loss, sneezing, congestion, runny nose or sore throat.  SKIN: No rash or itching.  CARDIOVASCULAR: per HPI RESPIRATORY: No shortness of breath, cough or sputum.  GASTROINTESTINAL: No anorexia, nausea, vomiting or  diarrhea. No abdominal pain or blood.  GENITOURINARY: No burning on urination, no polyuria NEUROLOGICAL: No headache, dizziness, syncope, paralysis, ataxia, numbness or tingling in the extremities. No change in bowel or bladder control.  MUSCULOSKELETAL: No muscle, back pain, joint pain or stiffness.  LYMPHATICS: No enlarged nodes. No history of splenectomy.  PSYCHIATRIC: No history of depression or anxiety.  ENDOCRINOLOGIC: No reports of sweating, cold or heat intolerance. No polyuria or polydipsia.  Marland Kitchen   Physical Examination There were no vitals filed for this visit. There were no vitals filed for this visit.  Gen: resting comfortably, no acute distress HEENT: no scleral icterus, pupils equal round and reactive, no palptable cervical adenopathy,  CV Resp: Clear to auscultation bilaterally GI: abdomen is soft, non-tender, non-distended, normal bowel sounds, no hepatosplenomegaly MSK: extremities are warm, no edema.  Skin: warm, no rash Neuro:  no focal deficits Psych: appropriate affect   Diagnostic Studies 06/2014 Cath FINDINGS:  Hemodynamics:   Central Aortic Pressure / Mean: 108/62/83 mmHg  Left Ventricular Pressure / LVEDP: 107/7/17 mmHg  Left Ventriculography:  EF: ~45% %  Wall Motion: global Hypokinesis  Coronary Anatomy:  Dominance: Right  Left Main: Large caliber, long trunk that trifurcates into the LAD, Circumflex & Ramus Intermedius. Angiographically normal. LAD: Begins as a large caliber vessel that tapers into a relatively small caliber (~1.5-2.0 mm) vessel distally. It wraps the apex, perfusing the distal 1/3 of the infero-apex. The distal vessel has tandem 60&70-80% focal lesions prior to the apex.  D1: Very small caliber bifurcating vessel with proximal ~70% stenosis.  Left Circumflex: Begins as a large caliber vessel that also tapers to a small to moderate caliber vessel in the AV Groove where it gives off a small caliber OM1 Demya Scruggs and is 100%  occluded just beyond that point.   Post-PTCA: beyond the occluded AV Groove Circumflex, there is a small to moderate caliber LPL Alexx Mcburney.  Ramus intermedius: Large caliber vessel that bifurcates in the mid-vessel into to small to moderate caliber branches that both reach the apex.   RCA: Large caliber, dominant vessel with diffuse ~20-40% lesions, but no obstructive disease. It bifurcates distally in to a small caliber, short PDA and Right Posterior AV Groove Amyia Lodwick (RPAV) branches. RPAV gives off several very small banches.  After reviewing the initial angiography, the culprit lesion was thought to be the occluded distal-AV Groove circumflesx. Preparation were made to proceed with PTCA on this lesion.  Percutaneous Coronary Intervention:  Guide: 6 Fr CLS 3.5 -- very difficult guide support, catheter moved & disengaged with patient movement Guidewire: BMW (advanced initially in to an atrial Elian Gloster for support & balloon advanced into proximal Circumflex allowing for further advancmemt of the wire beyond the culprit lesion. Predilation Balloon #1: Emerge 1.5 mm x 12 mm; Used to assist with wire support to advance guidewire  6 Atm x 30 Sec, 8 Atm x 30 Sec  Post inflation  revealed a small to moderate follow-on circumflex with LPL Jatoya Armbrister.   Due to the relatively small sized distal vessel, the decision was to perform PTCA only. Balloon #2: Euphora 2.0 mm x 12 mm;   8 Atm x 90 Sec x 2 with IC NTG - no recoil  Final Diameter: ~2.0 mm  Post deployment angiography in multiple views, with and without guidewire in place revealed excellent stent deployment and lesion coverage. There was no evidence of dissection or perforation.  POST-OPERATIVE DIAGNOSIS:   Severe 2 vessel disease in small caliber vessel - distal AV Groove Circumflex 100% and distal LAD ~60&80% lesions (1.5 mm vessel)  Successful PTCA of the occluded AV Groove Circumflex with Stent-like result.  Moderately reduced  LVEF by LV Gram  Normal LVEDP   06/2014 Echo Study Conclusions  - Left ventricle: The cavity size was normal. Wall thickness was normal. Systolic function was normal. The estimated ejection fraction was in the range of 50% to 55%. Inferior hypokinesis. Doppler parameters are consistent with abnormal left ventricular relaxation (grade 1 diastolic dysfunction). The E/e&' ratio is 15, suggesting borderline elevated LV filling pressure. - Aortic valve: Trileaflet. Sclerosis without stenosis. There was no regurgitation. - Left atrium: The atrium was normal in size. - Tricuspid valve: There was mild regurgitation. - Pulmonary arteries: PA peak pressure: 33 mm Hg (S).  Impressions:  - LVEF 50-55%, inferior hypokinesis, mild TR, top normal RVSP, diastolic dysfunction with borderline elevated LV filling pressure.   Assessment and Plan  1. CAD - denies any current symptoms, continue DAPT until 06/2015  2. HTN - at goal, continue current meds  3. Hyperlipidemia - side effects to atrorva and prava, will try low dose lovastatin. Repeat lipid panel. If intolerant may be a pcsk9 candidate.   4. DM II - followed by pcp      Arnoldo Lenis, M.D.

## 2014-10-15 LAB — LIPID PANEL
CHOLESTEROL: 222 mg/dL — AB (ref 0–200)
HDL: 33 mg/dL — AB (ref >39)
LDL Cholesterol: 149 mg/dL — ABNORMAL HIGH (ref 0–99)
TRIGLYCERIDES: 201 mg/dL — AB (ref <150)
Total CHOL/HDL Ratio: 6.7 Ratio
VLDL: 40 mg/dL (ref 0–40)

## 2014-10-21 ENCOUNTER — Telehealth: Payer: Self-pay | Admitting: *Deleted

## 2014-10-21 NOTE — Telephone Encounter (Signed)
Pt states that the medication her was given the other day to take, he is not able to take them either. Pt think it may be his BP pills ( lisinopril) causing his muscles to ache and his nose stays stopped up.  Pt would like a call back tomorrow, he will not be home today.

## 2014-10-23 NOTE — Telephone Encounter (Signed)
Just started pt on lovastatin,will forward to Dr.Branch

## 2014-10-24 NOTE — Telephone Encounter (Signed)
Message is somewhat unclear. We started him on lovastatin last visit, he had some prior side effects on other statins and we hoped he would be able to tolerate it. If he has started lovastatin and now has muscle aches it is likely due to the lovastatin. He can hold it for now, call us back in a week and update on symptoms.  Zandra Abts MD

## 2014-10-24 NOTE — Telephone Encounter (Signed)
Pt agrees to hold lovastatin for 1 week and call back to report if sx's went away.he will continue to take lisinopril

## 2014-10-29 ENCOUNTER — Other Ambulatory Visit: Payer: Self-pay | Admitting: Urology

## 2014-10-29 DIAGNOSIS — C674 Malignant neoplasm of posterior wall of bladder: Secondary | ICD-10-CM

## 2014-11-11 ENCOUNTER — Encounter (HOSPITAL_COMMUNITY): Payer: Self-pay

## 2014-11-11 ENCOUNTER — Ambulatory Visit (HOSPITAL_COMMUNITY)
Admission: RE | Admit: 2014-11-11 | Discharge: 2014-11-11 | Disposition: A | Discharge status: Home / Self Care | Payer: Medicare HMO | Source: Ambulatory Visit | Attending: Urology | Admitting: Urology

## 2014-11-11 DIAGNOSIS — C674 Malignant neoplasm of posterior wall of bladder: Secondary | ICD-10-CM | POA: Insufficient documentation

## 2014-11-11 MED ORDER — GENTAMICIN SULFATE 40 MG/ML IJ SOLN
INTRAMUSCULAR | Status: AC
  Administered 2014-11-11: 160 mg via INTRAMUSCULAR
  Filled 2014-11-11: qty 4

## 2014-11-11 MED ORDER — LIDOCAINE HCL (PF) 2 % IJ SOLN
INTRAMUSCULAR | Status: AC
  Administered 2014-11-11: 200 mg
  Filled 2014-11-11: qty 10

## 2014-11-11 MED ORDER — LIDOCAINE HCL (PF) 1 % IJ SOLN
INTRAMUSCULAR | Status: AC
  Filled 2014-11-11: qty 5

## 2014-11-11 MED ORDER — GENTAMICIN SULFATE 40 MG/ML IJ SOLN
INTRAMUSCULAR | Status: AC
  Filled 2014-11-11: qty 2

## 2014-11-11 MED ORDER — CEFTRIAXONE SODIUM 1 G IJ SOLR
INTRAMUSCULAR | Status: AC
  Filled 2014-11-11: qty 10

## 2014-11-11 NOTE — Discharge Instructions (Signed)
Bladder Cancer Bladder cancer is an abnormal growth of tissue in your bladder. Your bladder is the balloon-like sac in your pelvis. It collects and stores urine that comes from the kidneys through the ureters. The bladder wall is made of layers. If cancer spreads into these layers and through the wall of the bladder, it becomes more difficult to treat.  There are four stages of bladder cancer:  Stage I. Cancer at this stage occurs in the bladder's inner lining but has not invaded the muscular bladder wall.  Stage II. At this stage, cancer has invaded the bladder wall but is still confined to the bladder.  Stage III. By this stage, the cancer cells have spread through the bladder wall to surrounding tissue. They may also have spread to the prostate in men or the uterus or vagina in women.  Stage IV. By this stage, cancer cells may have spread to the lymph nodes and other organs, such as your lungs, bones, or liver. RISK FACTORS Although the cause of bladder cancer is not known, the following risk factors can increase your chances of getting bladder cancer:   Smoking.   Occupational exposures, such as rubber, leather, textile, dyes, chemicals, and paint.  Being white.  Age.   Being male.   Having chronic bladder inflammation.   Having a bladder cancer history.   Having a family history of bladder cancer (heredity).   Having had chemotherapy or radiation therapy to the pelvis.   Being exposed to arsenic.  SYMPTOMS   Blood in the urine.   Pain with urination.   Frequent bladder or urine infections.  Increase in urgency and frequency of urination. DIAGNOSIS  Your health care provider may suspect bladder cancer based on your description of urinary symptoms or based on the finding of blood or infection in the urine (especially if this has recurred several times). Other tests or procedures that may be performed include:   A narrow tube being inserted into your bladder  through your urethra (cystoscopy) in order to view the lining of your bladder for tumors.   A biopsy to sample the tumor to see if cancer is present.  If cancer is present, it will then be staged to determine its severity and extent. It is important to know how deeply into the bladder wall the cancer has grown and whether the cancer has spread to any other parts of your body. Staging may require blood tests or special scans such as a CT scan, MRI, bone scan, or chest X-ray.  TREATMENT  Once your cancer has been diagnosed and staged, you should discuss a treatment plan with your health care provider. Based on the stage of the cancer, one treatment or a combination of treatments may be recommended. The most common forms of treatment are:   Surgery. Procedures that may be done include transurethral resection and cystectomy.  Radiation therapy. This is infrequently used to treat bladder cancer.   Chemotherapy. During this treatment, drugs are used to kill cancer cells.  Immunotherapy. This is usually administered directly into the bladder. HOME CARE INSTRUCTIONS  Take medicines only as directed by your health care provider.   Maintain a healthy diet.   Consider joining a support group. This may help you learn to cope with the stress of having bladder cancer.   Seek advice to help you manage treatment side effects.   Keep all follow-up visits as directed by your health care provider.   Inform your cancer specialist if you are  stress of having bladder cancer.    · Seek advice to help you manage treatment side effects.    · Keep all follow-up visits as directed by your health care provider.    · Inform your cancer specialist if you are admitted to the hospital.    SEEK MEDICAL CARE IF:  · There is blood in your urine.  · You have symptoms of a urinary tract infection. These include:  ¨ Tiredness.  ¨ Shakiness.  ¨ Weakness.  ¨ Muscle aches.  ¨ Abdominal pain.  ¨ Frequent and intense urge to urinate (in young women).  ¨ Burning feeling in the bladder or urethra during urination (in young women).  SEEK IMMEDIATE MEDICAL CARE IF:   You are unable to urinate.  Document Released: 11/24/2003 Document  Revised: 04/07/2014 Document Reviewed: 05/14/2013  Exit[employer]® Patient Information ©2015 [employer]. This information is not intended to replace advice given to you by your health care provider. Make sure you discuss any questions you have with your health care provider.

## 2014-11-13 ENCOUNTER — Encounter (HOSPITAL_COMMUNITY): Payer: Self-pay | Admitting: Cardiology

## 2014-12-30 ENCOUNTER — Ambulatory Visit (INDEPENDENT_AMBULATORY_CARE_PROVIDER_SITE_OTHER): Payer: Medicare Other | Admitting: Urology

## 2014-12-30 DIAGNOSIS — C61 Malignant neoplasm of prostate: Secondary | ICD-10-CM | POA: Diagnosis not present

## 2014-12-30 DIAGNOSIS — C674 Malignant neoplasm of posterior wall of bladder: Secondary | ICD-10-CM

## 2015-01-27 ENCOUNTER — Ambulatory Visit (INDEPENDENT_AMBULATORY_CARE_PROVIDER_SITE_OTHER): Payer: Medicare Other | Admitting: Urology

## 2015-01-27 DIAGNOSIS — C61 Malignant neoplasm of prostate: Secondary | ICD-10-CM

## 2015-01-27 DIAGNOSIS — C679 Malignant neoplasm of bladder, unspecified: Secondary | ICD-10-CM | POA: Diagnosis not present

## 2015-02-03 ENCOUNTER — Ambulatory Visit (INDEPENDENT_AMBULATORY_CARE_PROVIDER_SITE_OTHER): Payer: Medicare Other | Admitting: Urology

## 2015-02-03 DIAGNOSIS — N400 Enlarged prostate without lower urinary tract symptoms: Secondary | ICD-10-CM | POA: Diagnosis not present

## 2015-02-03 DIAGNOSIS — C674 Malignant neoplasm of posterior wall of bladder: Secondary | ICD-10-CM | POA: Diagnosis not present

## 2015-02-06 ENCOUNTER — Inpatient Hospital Stay (HOSPITAL_COMMUNITY)
Admission: EM | Admit: 2015-02-06 | Discharge: 2015-02-07 | DRG: 247 | Disposition: A | Discharge status: Home / Self Care | Payer: Medicare Other | Attending: Cardiology | Admitting: Cardiology

## 2015-02-06 ENCOUNTER — Encounter (HOSPITAL_COMMUNITY): Payer: Self-pay | Admitting: *Deleted

## 2015-02-06 ENCOUNTER — Emergency Department (HOSPITAL_COMMUNITY): Payer: Medicare Other

## 2015-02-06 ENCOUNTER — Encounter (HOSPITAL_COMMUNITY)
Admission: EM | Disposition: A | Discharge status: Home / Self Care | Payer: Self-pay | Source: Home / Self Care | Attending: Cardiology

## 2015-02-06 DIAGNOSIS — Z955 Presence of coronary angioplasty implant and graft: Secondary | ICD-10-CM | POA: Diagnosis not present

## 2015-02-06 DIAGNOSIS — Z8551 Personal history of malignant neoplasm of bladder: Secondary | ICD-10-CM

## 2015-02-06 DIAGNOSIS — H9190 Unspecified hearing loss, unspecified ear: Secondary | ICD-10-CM | POA: Diagnosis present

## 2015-02-06 DIAGNOSIS — E780 Pure hypercholesterolemia: Secondary | ICD-10-CM | POA: Diagnosis not present

## 2015-02-06 DIAGNOSIS — Z79899 Other long term (current) drug therapy: Secondary | ICD-10-CM

## 2015-02-06 DIAGNOSIS — Z87891 Personal history of nicotine dependence: Secondary | ICD-10-CM

## 2015-02-06 DIAGNOSIS — I10 Essential (primary) hypertension: Secondary | ICD-10-CM | POA: Diagnosis not present

## 2015-02-06 DIAGNOSIS — E876 Hypokalemia: Secondary | ICD-10-CM | POA: Diagnosis not present

## 2015-02-06 DIAGNOSIS — E119 Type 2 diabetes mellitus without complications: Secondary | ICD-10-CM | POA: Diagnosis not present

## 2015-02-06 DIAGNOSIS — Z7982 Long term (current) use of aspirin: Secondary | ICD-10-CM

## 2015-02-06 DIAGNOSIS — M79602 Pain in left arm: Secondary | ICD-10-CM | POA: Diagnosis not present

## 2015-02-06 DIAGNOSIS — I214 Non-ST elevation (NSTEMI) myocardial infarction: Principal | ICD-10-CM

## 2015-02-06 DIAGNOSIS — Z7902 Long term (current) use of antithrombotics/antiplatelets: Secondary | ICD-10-CM

## 2015-02-06 DIAGNOSIS — I2510 Atherosclerotic heart disease of native coronary artery without angina pectoris: Secondary | ICD-10-CM

## 2015-02-06 DIAGNOSIS — E785 Hyperlipidemia, unspecified: Secondary | ICD-10-CM | POA: Diagnosis not present

## 2015-02-06 DIAGNOSIS — I252 Old myocardial infarction: Secondary | ICD-10-CM | POA: Diagnosis not present

## 2015-02-06 DIAGNOSIS — R52 Pain, unspecified: Secondary | ICD-10-CM | POA: Diagnosis present

## 2015-02-06 DIAGNOSIS — C679 Malignant neoplasm of bladder, unspecified: Secondary | ICD-10-CM | POA: Diagnosis present

## 2015-02-06 HISTORY — DX: Hyperlipidemia, unspecified: E78.5

## 2015-02-06 HISTORY — DX: Unspecified hearing loss, unspecified ear: H91.90

## 2015-02-06 HISTORY — PX: CARDIAC CATHETERIZATION: SHX172

## 2015-02-06 HISTORY — PX: LEFT HEART CATHETERIZATION WITH CORONARY ANGIOGRAM: SHX5451

## 2015-02-06 LAB — CBC WITH DIFFERENTIAL/PLATELET
BASOS ABS: 0 10*3/uL (ref 0.0–0.1)
Basophils Relative: 0 % (ref 0–1)
EOS PCT: 1 % (ref 0–5)
Eosinophils Absolute: 0.1 10*3/uL (ref 0.0–0.7)
HCT: 42.1 % (ref 39.0–52.0)
Hemoglobin: 14.5 g/dL (ref 13.0–17.0)
LYMPHS PCT: 20 % (ref 12–46)
Lymphs Abs: 1.5 10*3/uL (ref 0.7–4.0)
MCH: 31 pg (ref 26.0–34.0)
MCHC: 34.4 g/dL (ref 30.0–36.0)
MCV: 90 fL (ref 78.0–100.0)
Monocytes Absolute: 1.2 10*3/uL — ABNORMAL HIGH (ref 0.1–1.0)
Monocytes Relative: 17 % — ABNORMAL HIGH (ref 3–12)
NEUTROS PCT: 62 % (ref 43–77)
Neutro Abs: 4.6 10*3/uL (ref 1.7–7.7)
PLATELETS: 222 10*3/uL (ref 150–400)
RBC: 4.68 MIL/uL (ref 4.22–5.81)
RDW: 15 % (ref 11.5–15.5)
WBC: 7.4 10*3/uL (ref 4.0–10.5)

## 2015-02-06 LAB — I-STAT TROPONIN, ED: Troponin i, poc: 0.62 ng/mL (ref 0.00–0.08)

## 2015-02-06 LAB — GLUCOSE, CAPILLARY
GLUCOSE-CAPILLARY: 73 mg/dL (ref 70–99)
Glucose-Capillary: 93 mg/dL (ref 70–99)

## 2015-02-06 LAB — I-STAT CHEM 8, ED
BUN: 22 mg/dL (ref 6–23)
CALCIUM ION: 1.07 mmol/L — AB (ref 1.13–1.30)
CHLORIDE: 105 mmol/L (ref 96–112)
CREATININE: 1.1 mg/dL (ref 0.50–1.35)
GLUCOSE: 149 mg/dL — AB (ref 70–99)
HCT: 46 % (ref 39.0–52.0)
Hemoglobin: 15.6 g/dL (ref 13.0–17.0)
POTASSIUM: 3.9 mmol/L (ref 3.5–5.1)
Sodium: 139 mmol/L (ref 135–145)
TCO2: 20 mmol/L (ref 0–100)

## 2015-02-06 LAB — TROPONIN I
TROPONIN I: 0.98 ng/mL — AB (ref <0.031)
Troponin I: 1.33 ng/mL (ref <0.031)
Troponin I: 6.93 ng/mL (ref <0.031)

## 2015-02-06 LAB — COMPREHENSIVE METABOLIC PANEL
ALK PHOS: 102 U/L (ref 39–117)
ALT: 37 U/L (ref 0–53)
AST: 44 U/L — AB (ref 0–37)
Albumin: 3.5 g/dL (ref 3.5–5.2)
Anion gap: 10 (ref 5–15)
BUN: 15 mg/dL (ref 6–23)
CALCIUM: 8.3 mg/dL — AB (ref 8.4–10.5)
CO2: 21 mmol/L (ref 19–32)
Chloride: 107 mmol/L (ref 96–112)
Creatinine, Ser: 1.14 mg/dL (ref 0.50–1.35)
GFR, EST AFRICAN AMERICAN: 75 mL/min — AB (ref >90)
GFR, EST NON AFRICAN AMERICAN: 65 mL/min — AB (ref >90)
Glucose, Bld: 97 mg/dL (ref 70–99)
Potassium: 3.8 mmol/L (ref 3.5–5.1)
SODIUM: 138 mmol/L (ref 135–145)
Total Bilirubin: 0.9 mg/dL (ref 0.3–1.2)
Total Protein: 6.5 g/dL (ref 6.0–8.3)

## 2015-02-06 LAB — PROTIME-INR
INR: 1.14 (ref 0.00–1.49)
PROTHROMBIN TIME: 14.7 s (ref 11.6–15.2)

## 2015-02-06 LAB — MAGNESIUM: Magnesium: 2.2 mg/dL (ref 1.5–2.5)

## 2015-02-06 LAB — POCT ACTIVATED CLOTTING TIME: ACTIVATED CLOTTING TIME: 275 s

## 2015-02-06 LAB — HEPARIN LEVEL (UNFRACTIONATED): HEPARIN UNFRACTIONATED: 0.15 [IU]/mL — AB (ref 0.30–0.70)

## 2015-02-06 LAB — TSH: TSH: 1.805 u[IU]/mL (ref 0.350–4.500)

## 2015-02-06 SURGERY — CORONARY STENT INTERVENTION

## 2015-02-06 MED ORDER — ASPIRIN 81 MG PO CHEW: 324.0000 mg | CHEWABLE_TABLET | ORAL | Status: DC

## 2015-02-06 MED ORDER — HEPARIN (PORCINE) IN NACL 2-0.9 UNIT/ML-% IJ SOLN
INTRAMUSCULAR | Status: AC
  Filled 2015-02-06: qty 1500

## 2015-02-06 MED ORDER — HEPARIN (PORCINE) IN NACL 100-0.45 UNIT/ML-% IJ SOLN
1100.0000 [IU]/h | INTRAMUSCULAR | Status: DC
  Administered 2015-02-06: 1100 [IU]/h via INTRAVENOUS
  Filled 2015-02-06 (×3): qty 250

## 2015-02-06 MED ORDER — EZETIMIBE 10 MG PO TABS
10.0000 mg | ORAL_TABLET | Freq: Every day | ORAL | Status: DC
  Administered 2015-02-06: 23:00:00 10 mg via ORAL
  Filled 2015-02-06 (×2): qty 1

## 2015-02-06 MED ORDER — HEPARIN SODIUM (PORCINE) 1000 UNIT/ML IJ SOLN
INTRAMUSCULAR | Status: AC
  Filled 2015-02-06: qty 1

## 2015-02-06 MED ORDER — VERAPAMIL HCL 2.5 MG/ML IV SOLN
INTRAVENOUS | Status: AC
  Filled 2015-02-06: qty 2

## 2015-02-06 MED ORDER — INSULIN ASPART 100 UNIT/ML ~~LOC~~ SOLN
0.0000 [IU] | Freq: Three times a day (TID) | SUBCUTANEOUS | Status: DC
  Administered 2015-02-07: 08:00:00 2 [IU] via SUBCUTANEOUS

## 2015-02-06 MED ORDER — ASPIRIN 81 MG PO CHEW: 81.0000 mg | CHEWABLE_TABLET | Freq: Every day | ORAL | Status: DC

## 2015-02-06 MED ORDER — FENTANYL CITRATE 0.05 MG/ML IJ SOLN
INTRAMUSCULAR | Status: AC
  Filled 2015-02-06: qty 2

## 2015-02-06 MED ORDER — CLOPIDOGREL BISULFATE 75 MG PO TABS
75.0000 mg | ORAL_TABLET | Freq: Every day | ORAL | Status: DC
  Administered 2015-02-06 – 2015-02-07 (×2): 75 mg via ORAL
  Filled 2015-02-06 (×2): qty 1

## 2015-02-06 MED ORDER — ASPIRIN 81 MG PO CHEW: 81.0000 mg | CHEWABLE_TABLET | ORAL | Status: DC

## 2015-02-06 MED ORDER — ACETAMINOPHEN 325 MG PO TABS
650.0000 mg | ORAL_TABLET | ORAL | Status: DC | PRN
  Administered 2015-02-07: 01:00:00 650 mg via ORAL
  Filled 2015-02-06: qty 2

## 2015-02-06 MED ORDER — NITROGLYCERIN 1 MG/10 ML FOR IR/CATH LAB
INTRA_ARTERIAL | Status: AC
  Filled 2015-02-06: qty 10

## 2015-02-06 MED ORDER — HEPARIN SODIUM (PORCINE) 5000 UNIT/ML IJ SOLN
5000.0000 [IU] | Freq: Three times a day (TID) | INTRAMUSCULAR | Status: DC
  Administered 2015-02-07: 06:00:00 5000 [IU] via SUBCUTANEOUS
  Filled 2015-02-06: qty 1

## 2015-02-06 MED ORDER — LIDOCAINE HCL (PF) 1 % IJ SOLN
INTRAMUSCULAR | Status: AC
  Filled 2015-02-06: qty 30

## 2015-02-06 MED ORDER — LOSARTAN POTASSIUM 50 MG PO TABS
50.0000 mg | ORAL_TABLET | Freq: Every day | ORAL | Status: DC
  Administered 2015-02-06 – 2015-02-07 (×2): 50 mg via ORAL
  Filled 2015-02-06 (×2): qty 1

## 2015-02-06 MED ORDER — ASPIRIN 81 MG PO CHEW
81.0000 mg | CHEWABLE_TABLET | Freq: Every day | ORAL | Status: DC
  Filled 2015-02-06: qty 1

## 2015-02-06 MED ORDER — ONDANSETRON HCL 4 MG/2ML IJ SOLN: 4.0000 mg | Freq: Four times a day (QID) | INTRAMUSCULAR | Status: DC | PRN

## 2015-02-06 MED ORDER — SODIUM CHLORIDE 0.9 % IV SOLN: 250.0000 mL | INTRAVENOUS | Status: DC | PRN

## 2015-02-06 MED ORDER — ROSUVASTATIN CALCIUM 5 MG PO TABS
5.0000 mg | ORAL_TABLET | Freq: Every day | ORAL | Status: DC
  Administered 2015-02-07: 5 mg via ORAL
  Filled 2015-02-06 (×2): qty 1

## 2015-02-06 MED ORDER — LOSARTAN POTASSIUM 50 MG PO TABS: 50.0000 mg | ORAL_TABLET | Freq: Every day | ORAL | Status: DC

## 2015-02-06 MED ORDER — ASPIRIN 300 MG RE SUPP: 300.0000 mg | RECTAL | Status: DC

## 2015-02-06 MED ORDER — METOPROLOL SUCCINATE ER 25 MG PO TB24: 25.0000 mg | ORAL_TABLET | Freq: Every day | ORAL | Status: DC

## 2015-02-06 MED ORDER — EZETIMIBE 10 MG PO TABS: 10.0000 mg | ORAL_TABLET | Freq: Every day | ORAL | Status: DC

## 2015-02-06 MED ORDER — HEPARIN BOLUS VIA INFUSION
4000.0000 [IU] | Freq: Once | INTRAVENOUS | Status: AC
  Administered 2015-02-06: 4000 [IU] via INTRAVENOUS
  Filled 2015-02-06: qty 4000

## 2015-02-06 MED ORDER — CLOPIDOGREL BISULFATE 75 MG PO TABS: 75.0000 mg | ORAL_TABLET | Freq: Every day | ORAL | Status: DC

## 2015-02-06 MED ORDER — METOPROLOL SUCCINATE ER 25 MG PO TB24
25.0000 mg | ORAL_TABLET | Freq: Every day | ORAL | Status: DC
  Administered 2015-02-06 – 2015-02-07 (×2): 25 mg via ORAL
  Filled 2015-02-06 (×2): qty 1

## 2015-02-06 MED ORDER — SODIUM CHLORIDE 0.9 % IJ SOLN
3.0000 mL | Freq: Two times a day (BID) | INTRAMUSCULAR | Status: DC
  Administered 2015-02-06: 3 mL via INTRAVENOUS

## 2015-02-06 MED ORDER — GLIMEPIRIDE 1 MG PO TABS: 2.0000 mg | ORAL_TABLET | Freq: Every day | ORAL | Status: DC

## 2015-02-06 MED ORDER — NITROGLYCERIN 2 % TD OINT
0.5000 [in_us] | TOPICAL_OINTMENT | Freq: Once | TRANSDERMAL | Status: AC
  Administered 2015-02-06: 0.5 [in_us] via TOPICAL
  Filled 2015-02-06: qty 1

## 2015-02-06 MED ORDER — SODIUM CHLORIDE 0.9 % IJ SOLN: 3.0000 mL | INTRAMUSCULAR | Status: DC | PRN

## 2015-02-06 MED ORDER — SODIUM CHLORIDE 0.9 % IJ SOLN: 3.0000 mL | Freq: Two times a day (BID) | INTRAMUSCULAR | Status: DC

## 2015-02-06 MED ORDER — POTASSIUM CHLORIDE IN NACL 20-0.9 MEQ/L-% IV SOLN
INTRAVENOUS | Status: AC
  Filled 2015-02-06: qty 1000

## 2015-02-06 MED ORDER — MIDAZOLAM HCL 2 MG/2ML IJ SOLN
INTRAMUSCULAR | Status: AC
  Filled 2015-02-06: qty 2

## 2015-02-06 MED ORDER — ASPIRIN 81 MG PO CHEW
243.0000 mg | CHEWABLE_TABLET | Freq: Once | ORAL | Status: AC
  Administered 2015-02-06: 243 mg via ORAL
  Filled 2015-02-06: qty 3

## 2015-02-06 MED ORDER — NITROGLYCERIN 0.4 MG SL SUBL
0.4000 mg | SUBLINGUAL_TABLET | SUBLINGUAL | Status: DC | PRN
Start: 2015-02-06 — End: 2015-02-07

## 2015-02-06 MED ORDER — ASPIRIN EC 81 MG PO TBEC: 81.0000 mg | DELAYED_RELEASE_TABLET | Freq: Every day | ORAL | Status: DC

## 2015-02-06 MED ORDER — MORPHINE SULFATE 2 MG/ML IJ SOLN
2.0000 mg | INTRAMUSCULAR | Status: DC | PRN
Start: 2015-02-06 — End: 2015-02-07

## 2015-02-06 NOTE — ED Notes (Signed)
Family at bedside. 

## 2015-02-06 NOTE — Progress Notes (Signed)
Seen patient, pending cath. Risk and benefit of cardiac cath explained include bleeding, renal/vascular, arrhythmia, MI or stroke related complication. He displays clear understanding and agree to proceed. All questions answered.   Hilbert Corrigan PA Pager: [phone]

## 2015-02-06 NOTE — Progress Notes (Signed)
CBG 73 upon arrival cath holding. 7.5oz non-diet coke given to pt. Asymptomatic.

## 2015-02-06 NOTE — ED Notes (Signed)
Back from xray

## 2015-02-06 NOTE — ED Notes (Signed)
Patient states the pain in his left arm is to the upper arm on the lateral side and does not radiate to the lower arm or to the shoulder

## 2015-02-06 NOTE — Progress Notes (Addendum)
ANTICOAGULATION CONSULT NOTE - Initial Consult  Pharmacy Consult for Heparin Indication: chest pain/ACS  Allergies  Allergen Reactions  . Lipitor [Atorvastatin] Other (See Comments)    Myalgia  . Pravastatin Other (See Comments)    Myalgia    Patient Measurements: Height: 5\' 8"  (172.7 cm) Weight: 205 lb (92.987 kg) IBW/kg (Calculated) : 68.4 Heparin Dosing Weight: 85 kg  Vital Signs: Temp: 97.8 F (36.6 C) (03/04 0552) Temp Source: Oral (03/04 0552) BP: 132/73 mmHg (03/04 0700) Pulse Rate: 78 (03/04 0700)  Labs:  Recent Labs  02/06/15 0620 02/06/15 0633  HGB 14.5 15.6  HCT 42.1 46.0  PLT 222  --   CREATININE  --  1.10    Estimated Creatinine Clearance: 72.1 mL/min (by C-G formula based on Cr of 1.1).   Medical History: Past Medical History  Diagnosis Date  . Diabetes mellitus, type 2   . Hypertension   . Bladder cancer   . Elevated PSA   . CAD (coronary artery disease)     a. NSTEMI s/p PTCA of LCx, 60-80% residual in distal LAD, 70% prox small D1. Echo EF 50-55%  . HOH (hard of hearing)     Medications:  ASA  Plavix  Zetia  Cozaar  Mevacor  Toprol XL  Amaryl  Metformin  Assessment: 69 y.o. male with chest pain for heparin   Goal of Therapy:  Heparin level 0.3-0.7 units/ml Monitor platelets by anticoagulation protocol: Yes   Plan:  Heparin 4000 units IV bolus, then start heparin 1100 units/hr Check heparin level in 6 hours.   Caryl Pina 02/06/2015,7:31 AM   Addendum: LDL 149 in 10/2014. Patient has a statin intolerance to atorvastatin and pravastatin causing myalgias. Trying low dose Crestor in hopes he will be able to take a statin. Consider increase in dose if tolerable.  [place], Pharm.D., BCPS Clinical Pharmacist Pager: [phone] 02/06/2015 1:21 PM

## 2015-02-06 NOTE — ED Notes (Signed)
Patient presents from home with c/o left arm pain.  Stated it happened while watching a basketball game on TV last night (into early this AM)   Denies SOB, N/V, etc

## 2015-02-06 NOTE — ED Notes (Signed)
Troponin results given to Dr. Randal Buba and the nurse

## 2015-02-06 NOTE — Care Management Note (Unsigned)
    Page 1 of 1   02/06/2015     2:05:32 PM CARE MANAGEMENT NOTE 02/06/2015  Patient:  Timothy [name], Timothy [name]   Account Number:  [id number]  Date Initiated:  02/06/2015  Documentation initiated by:  GRAVES-BIGELOW,Rosella Crandell  Subjective/Objective Assessment:   Pt admitted for cp-Nstemi. Plan for cath 02-06-15.     Action/Plan:   Cm to monitor for disposition needs.   Anticipated DC Date:  02/07/2015   Anticipated DC Plan:  [place]  CM consult      Choice offered to / List presented to:             Status of service:  In process, will continue to follow Medicare Important Message given?   (If response is "NO", the following Medicare IM given date fields will be blank) Date Medicare IM given:   Medicare IM given by:   Date Additional Medicare IM given:   Additional Medicare IM given by:    Discharge Disposition:    Per UR Regulation:  Reviewed for med. necessity/level of care/duration of stay  If discussed at [place] of Stay Meetings, dates discussed:    Comments:

## 2015-02-06 NOTE — ED Notes (Signed)
Patient transported to X-ray 

## 2015-02-06 NOTE — CV Procedure (Signed)
CARDIAC CATHETERIZATION AND PERCUTANEOUS CORONARY INTERVENTION REPORT  NAME:  Timothy [name]   MRN: [id number] DOB:  12/18/1945   ADMIT DATE: 02/06/2015 Procedure Date: 02/06/2015  INTERVENTIONAL CARDIOLOGIST: [name], M.D., MS PRIMARY CARE PROVIDER: Glo Herring., MD PRIMARY CARDIOLOGIST: Carlyle Dolly, MD  PATIENT:  Timothy [name] is a 69 y.o. male with known history of CAD status post non-STEMI in July 2014 where he had PTCA of the distal circumflex/OM. He did have existing disease noted in the distal LAD, proximal first diagonal and distal PDA. He presented to [facility] emergency room on March 4 with one-day history of intermittent right upper arm pain. This is somewhat consistent with his previous anginal symptoms. He ruled in for non-ST elevation MI with troponin of slightly > 1. He is now referred for invasive evaluation. He has positive troponin and inferolateral ST segment inversion similar to July 2015.  PRE-OPERATIVE DIAGNOSIS:    Non-ST elevation MI  Known significant CAD with cardiac risk factors.  PROCEDURES PERFORMED:    Left Heart Catheterization with Native Coronary Angiography  via Right Radial Artery   Left Ventriculography  PROCEDURE: The patient was brought to the [facility] [place] [facility] in the fasting state and prepped and draped in the usual sterile fashion for Right Radial artery access. A modified Allen's test was performed on the right wrist demonstrating excellent collateral flow for radial access.   Sterile technique was used including antiseptics, cap, gloves, gown, hand hygiene, mask and sheet. Skin prep: Chlorhexidine.   Consent: Risks of procedure as well as the alternatives and risks of each were explained to the (patient/caregiver). Consent for procedure obtained.   Time Out: Verified patient identification, verified procedure, site/side was marked, verified correct patient position, special equipment/implants  available, medications/allergies/relevent history reviewed, required imaging and test results available. Performed.  Access:   Right Radial Artery: 6 Fr Sheath -  Seldinger Technique (Angiocath Micropuncture Kit)  Radial Cocktail - 10 mL; IV Heparin 4500 Units   Left Heart Catheterization: 5 Fr TIG 4.0 catheter advanced or exchanged over a long exchange safety J-wire. the catheter was used to selectively engage both the left and right coronary artery for cineangiography. He was then used to cross the aortic valve and left ventricle for measurement of hemodynamics. Left ventricular was not performed.   Following PCI, the sheath was removed in the Cath Lab with TR band placement for hemostasis.  TR Band: 1730  Hours; 12 mL air  FINDINGS:  Hemodynamics:   Central Aortic Pressure / Mean: 103/70/85 mmHg  Left Ventricular Pressure / LVEDP: 99/5/7 mmHg  Left Ventriculography: Deferred  Coronary Anatomy:  Dominance: Right  Left Main: Large caliber, long trunk that trifurcates into the LAD, Circumflex & Ramus Intermedius. Angiographically normal. LAD: Begins as a large caliber vessel that tapers into a relatively small caliber (~2.0 mm) vessel distally. It wraps the apex, perfusing the distal 1/3 of the infero-apex. The distal vessel has tandem 60&70-80% focal lesions prior to the apex - similar to initial catheterization in July 2015  D1: small caliber bifurcating vessel with ostial/proximal ~70% stenosis - also similar to prior catheterization  Left Circumflex: Begins as a large caliber vessel that also tapers to a small to moderate caliber vessel in the AV Groove where it gives off a small caliber OM1 branch.  The previous PTCA site beyond OM1 is in a very small caliber vessel that has 80-90% stenosis with very minimal distal flow as was noted post PTCA. Again  only amenable to some very small balloon PTCA if necessary, but not likely culprit lesion..   Ramus intermedius: Large caliber  vessel that bifurcates in the mid-vessel into to small to moderate caliber branches that both reach the apex.   RCA: Large caliber, dominant vessel with diffuse ~20-40% lesions in the proximal and distal vessel. There is a focal 95-99% eccentric stenosis in the mid vessel just at the takeoff of a large RV marginal branch. It bifurcates distally in to a small caliber, short PDA and Right Posterior AV Groove Branch (RPAV) branches. RPAV gives off several very small banches.  After reviewing the initial angiography, the culprit lesion was thought to be focal 95% stenosis of the mid RCA.  Preparation were made to proceed with PCI on this lesion. Distal LAD and diagonal branches have similar stenosis to that noted on previous cardiac catheterization. The patient has been asymptomatic with the stenoses. They are not great PCI options as they're relatively small in diameter. The diagonal branch would be very difficult as it is an ostial lesion with no landing zone prior to the ostium at the LAD.  Percutaneous Coronary Intervention:  Additional 3000 units of IV heparin administered Guide: 6 Fr   JR4 Guidewire: Prowater Predilation Balloon: Emerge 2.0 mm x 8 mm;   8 and 10 Atm x 30 Sec, distal and proximal Stent: Xience Alpine DES 2.5 mm x 12 mm;   Deployment at 16 Atm x 30 Sec,= 2.74 mm  Additional 1500 units of IV heparin administered Post-dilation Balloon: Vamo Emerge 3.0 mm x 8 mm; proximal two thirds of stent  14 Atm x 30 Sec,   Final Diameter: 3.0  Post deployment angiography in multiple views, with and without guidewire in place revealed excellent stent deployment and lesion coverage.  There was no evidence of dissection or perforation.  MEDICATIONS:  Anesthesia:  Local Lidocaine 2 ml  Sedation:  2 mg IV Versed, 25 mcg IV fentanyl ;   Omnipaque Contrast: 140 ml  Anticoagulation:  IV Heparin 4500+3000 for PCI and an additional 1500 Units ; Act > 275 Sec  Anti-Platelet Agent:  The  patient is on aspirin 81 mg and Plavix 75 mg Radial Cocktail: 5 mg Verapamil, 400 mcg NTG, 2 ml 2% Lidocaine in 10 ml NS Intracoronary Nitroglycerin 200 g 1   PATIENT DISPOSITION:    The patient was transferred to the PACU holding area in a hemodynamicaly stable, chest pain free condition.  The patient tolerated the procedure well, and there were no complications.  EBL:   < 5 ml  The patient was stable before, during, and after the procedure.  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 This is clearly the culprit lesion as the remaining lesions are similar to prior catheterization.  Successful focal PCI on the mid RCA with a Xience alpine DES stent 2.5 mm a 12 mm postdilated and tapered fashion from 3.1 down to 2.75 mm in tapering fashion.  Normal LVEDP  Significant restenosis in the distal AV groove circumflex at the previous PTCA site. Still not amenable PCI with stent. At this time would treat this medically.  PLAN OF CARE:  Return to nursing unit for standard post radial PCI care.  Can you dual antiplatelet therapy as the patient now has a DES stent. Would continue for at least one year.  Can you medical management for existing disease. The patient were to have  symptoms, would consider noninvasive evaluation to determine if the distal LAD is significant. Diagonal branch is probably not amenable to anything more than balloon angioplasty.  Continue home dose of beta blocker.  He is on Zetia due to statin intolerance.    [name], M.D., M.S. Interventional Cardiologist   Pager # [phone]

## 2015-02-06 NOTE — ED Provider Notes (Signed)
CSN: [id number]     Arrival date & time 02/06/15  0541 History   First MD Initiated Contact with Patient 02/06/15 0600     Chief Complaint  Patient presents with  . Arm Pain     (Consider location/radiation/quality/duration/timing/severity/associated sxs/prior Treatment) HPI  Timothy [name] is a 69 y.o. male with PMH of diabetes, CAD status post an STEMI July 2015 with echo EF 50-55%, bladder cancer, hypertension presenting with left arm pain that is described as a soreness that started 11 AM yesterday morning and is constant. Stated it happened at rest he denies any chest pain or shortness of breath, nausea, vomiting. He denies heavy lifting, injury or working outside . He reports no relief with daily baby aspirin as well as naproxen. Patient reports taking his medications as prescribed. No anticoagulants but he is on Plavix. Patient denies smoking. No back pain.   Past Medical History  Diagnosis Date  . Diabetes mellitus, type 2   . Hypertension   . Bladder cancer   . Elevated PSA   . CAD (coronary artery disease)     a. NSTEMI s/p PTCA of LCx, 60-80% residual in distal LAD, 70% prox small D1. Echo EF 50-55%  . HOH (hard of hearing)    Past Surgical History  Procedure Laterality Date  . Transurethral resection of bladder tumor N/A 03/14/2013    Procedure: TRANSURETHRAL RESECTION OF BLADDER TUMOR (TURBT);  Surgeon: [name], MD;  Location: [facility];  Service: Urology;  Laterality: N/A;  1 HR WITH MITOMYCIN INSTILLATION   . Transurethral resection of bladder tumor N/A 09/30/2013    Procedure: TRANSURETHRAL RESECTION OF BLADDER TUMOR (TURBT);  Surgeon: [name], MD;  Location: [facility];  Service: Urology;  Laterality: N/A;  . Transurethral resection of bladder tumor N/A 03/31/2014    Procedure: TRANSURETHRAL RESECTION OF BLADDER TUMOR (TURBT);  Surgeon: [name], MD;  Location[facility];  Service:  Urology;  Laterality: N/A;  . Prostate biopsy N/A 03/31/2014    Procedure: BIOPSY TRANSRECTAL ULTRASONIC PROSTATE (TUBP);  Surgeon: [name], MD;  Location: [facility];  Service: Urology;  Laterality: N/A;  . Left heart catheterization with coronary angiogram N/A 06/17/2014    Procedure: LEFT HEART CATHETERIZATION WITH CORONARY ANGIOGRAM;  Surgeon: [name], MD;  Location: [facility] CATH LAB;  Service: Cardiovascular;  Laterality: N/A;   History reviewed. No pertinent family history. History  Substance Use Topics  . Smoking status: Former Smoker -- 1.00 packs/day for 30 years    Types: Cigarettes    Quit date: 03/11/1993  . Smokeless tobacco: Never Used  . Alcohol Use: No    Review of Systems 10 Systems reviewed and are negative for acute change except as noted in the HPI.    Allergies  Lipitor and Pravastatin  Home Medications   Prior to Admission medications   Medication Sig Start Date End Date Taking? Authorizing Provider  aspirin 81 MG chewable tablet Chew 1 tablet (81 mg total) by mouth daily. 06/19/14  Yes Almyra Deforest, PA  clopidogrel (PLAVIX) 75 MG tablet Take 1 tablet (75 mg total) by mouth daily with breakfast. 06/19/14  Yes Almyra Deforest, PA  ezetimibe (ZETIA) 10 MG tablet Take 1 tablet (10 mg total) by mouth daily. 08/07/14  Yes Lendon Colonel, NP  glimepiride (AMARYL) 2 MG tablet Take 2 mg by mouth daily.    Yes Historical Provider, MD  losartan (COZAAR) 50 MG tablet Take 1 tablet (50 mg  total) by mouth daily. 06/19/14  Yes Almyra Deforest, PA  lovastatin (MEVACOR) 20 MG tablet Take 1 tablet (20 mg total) by mouth at bedtime. 10/13/14  Yes Arnoldo Lenis, MD  metFORMIN (GLUCOPHAGE) 500 MG tablet Take 500 mg by mouth 2 (two) times daily with a meal.   Yes Historical Provider, MD  metoprolol succinate (TOPROL-XL) 25 MG 24 hr tablet Take 1 tablet (25 mg total) by mouth daily. 06/19/14  Yes Hao Meng, PA   BP 144/83 mmHg  Pulse 75  Temp(Src) 97.8 F (36.6 C)  (Oral)  Resp 17  Ht 5\' 8"  (1.727 m)  Wt 205 lb (92.987 kg)  BMI 31.18 kg/m2  SpO2 95% Physical Exam  Constitutional: He appears well-developed and well-nourished. No distress.  HENT:  Head: Normocephalic and atraumatic.  Eyes: Conjunctivae and EOM are normal. Right eye exhibits no discharge. Left eye exhibits no discharge.  Cardiovascular: Normal rate and regular rhythm.   No leg swelling or tenderness. Negative Homan's sign. 2+ radial pulses equal bilaterally.  Pulmonary/Chest: Effort normal and breath sounds normal. No respiratory distress. He has no wheezes.  Abdominal: Soft. Bowel sounds are normal. He exhibits no distension. There is no tenderness.  Musculoskeletal:  Left arm tenderness circumferentially around mid humerus. No clavicular step-off or crepitus. Full range of motion of left arm. Tenderness to anterior humeral head. No tenderness in remainder of arm  Neurological: He is alert. He exhibits normal muscle tone. Coordination normal.  5 of 5 strength in bilateral upper extremities.   Skin: Skin is warm and dry. He is not diaphoretic.  Nursing note and vitals reviewed.   ED Course  Procedures (including critical care time) Labs Review Labs Reviewed  CBC WITH DIFFERENTIAL/PLATELET - Abnormal; Notable for the following:    Monocytes Relative 17 (*)    Monocytes Absolute 1.2 (*)    All other components within normal limits  TROPONIN I - Abnormal; Notable for the following:    Troponin I 0.98 (*)    All other components within normal limits  I-STAT CHEM 8, ED - Abnormal; Notable for the following:    Glucose, Bld 149 (*)    Calcium, Ion 1.07 (*)    All other components within normal limits  I-STAT TROPOININ, ED - Abnormal; Notable for the following:    Troponin i, poc 0.62 (*)    All other components within normal limits  HEPARIN LEVEL (UNFRACTIONATED)    Imaging Review Dg Chest 2 View  02/06/2015   CLINICAL DATA:  Coronary artery disease.  24 hours of left arm  pain  EXAM: CHEST  2 VIEW  COMPARISON:  July 31, 2012  FINDINGS: Lungs are clear. Heart size and pulmonary vascularity are normal. No adenopathy. No pneumothorax. No bone lesions.  IMPRESSION: No edema or consolidation.   Electronically Signed   By: Lowella Grip III M.D.   On: 02/06/2015 07:09     EKG Interpretation   Date/Time:  Friday February 06 2015 05:47:49 EST Ventricular Rate:  94 PR Interval:  147 QRS Duration: 91 QT Interval:  358 QTC Calculation: 448 R Axis:   109 Text Interpretation:  Sinus rhythm Anterior infarct, old non specific st  changes Confirmed by [facility]  MD, APRIL ([id number]) on 02/06/2015 5:56:12  AM      CRITICAL CARE Performed by: Pura Spice   Total critical care time: 28min  Critical care time was exclusive of separately billable procedures and treating other patients.  Critical care was necessary to treat  or prevent imminent or life-threatening deterioration.  Critical care was time spent personally by me on the following activities: development of treatment plan with patient and/or surrogate as well as nursing, discussions with consultants, evaluation of patient's response to treatment, examination of patient, obtaining history from patient or surrogate, ordering and performing treatments and interventions, ordering and review of laboratory studies, ordering and review of radiographic studies, pulse oximetry and re-evaluation of patient's condition.   MDM   Final diagnoses:  NSTEMI (non-ST elevated myocardial infarction)   Pt presenting with intermittent left arm pain since 11 AM yesterday morning without shortness of breath, chest pain, nausea, vomiting. VSS. Pt left arm pain reproducible and without reproducible chest pain. There are lungs clear to ascultation bilaterally and regular rate and rhythm. Initial i-STAT troponin 0.62. Other lab work noncontribuatory. Negative CXR. Cardiology consult and spoke with Dr. Tempie Hoist who stated i-STAT  troponin is too sensitive. And recommend repeat Troponin I and felt internal medicine should admit. Further review of patient's last NSTEMI revealed his presentation was  without chest pain. Concern for the patient and IV heparin ordered. Repeat troponin I resulted at 6:44 and was 0.98. Cardiology reconsulted. Dr. April Palumbo-Rasch spoke with Wannetta Sender who agrees to admit patient to cardiology service. Dr. Mare Ferrari to evaluate  Discussed all results and patient verbalizes understanding and agrees with plan.  This is a shared patient. This patient was discussed with the physician who saw and evaluated the patient and agrees with the plan.     Pura Spice, PA-C 02/06/15 [place], [place] 02/06/15 [place], [place] 02/06/15 [phone]

## 2015-02-06 NOTE — H&P (Signed)
Patient ID: Timothy [name] MRN: [id number], DOB/AGE: 68-Mar-1947   Admit date: 02/06/2015   Primary Physician: Glo Herring., MD Primary Cardiologist: Dr. Harl Bowie  Pt. Profile:  70 year old gentleman presents with recurrent left biceps discomfort.  History of known coronary disease.  Troponin mildly elevated.  Problem List  Past Medical History  Diagnosis Date  . Diabetes mellitus, type 2   . Hypertension   . Bladder cancer   . Elevated PSA   . CAD (coronary artery disease)     a. NSTEMI s/p PTCA of LCx, 60-80% residual in distal LAD, 70% prox small D1. Echo EF 50-55%  . HOH (hard of hearing)     Past Surgical History  Procedure Laterality Date  . Transurethral resection of bladder tumor N/A 03/14/2013    Procedure: TRANSURETHRAL RESECTION OF BLADDER TUMOR (TURBT);  Surgeon: [name], MD;  Location: [facility];  Service: Urology;  Laterality: N/A;  1 HR WITH MITOMYCIN INSTILLATION   . Transurethral resection of bladder tumor N/A 09/30/2013    Procedure: TRANSURETHRAL RESECTION OF BLADDER TUMOR (TURBT);  Surgeon: [name], MD;  Location: [facility];  Service: Urology;  Laterality: N/A;  . Transurethral resection of bladder tumor N/A 03/31/2014    Procedure: TRANSURETHRAL RESECTION OF BLADDER TUMOR (TURBT);  Surgeon: [name], MD;  Location: [facility];  Service: Urology;  Laterality: N/A;  . Prostate biopsy N/A 03/31/2014    Procedure: BIOPSY TRANSRECTAL ULTRASONIC PROSTATE (TUBP);  Surgeon: [name], MD;  Location: [facility];  Service: Urology;  Laterality: N/A;  . Left heart catheterization with coronary angiogram N/A 06/17/2014    Procedure: LEFT HEART CATHETERIZATION WITH CORONARY ANGIOGRAM;  Surgeon: [name], MD;  Location: [facility] CATH LAB;  Service: Cardiovascular;  Laterality: N/A;     Allergies  Allergies  Allergen Reactions  . Lipitor [Atorvastatin] Other  (See Comments)    Myalgia  . Pravastatin Other (See Comments)    Myalgia    HPI  This 69 year old gentleman presented to the emergency room early this morning with a one-day history of intermittent aching in his left biceps.  He denies any chest discomfort.  Interestingly, he has never had typical chest pain.  In the past his ischemia has presented with left arm discomfort and sometimes bilateral arm discomfort as well as pain across the shoulders posteriorly.  The patient a non-STEMI and had left heart catheterization in 06/17/14 and underwent PTCA of circumflex by Dr. Ellyn Hack.  He did not have a stent.  There was residual disease noted in the distal LAD and diagonal 1.  He is on dual antiplatelet therapy.  He is intolerant of Lipitor and has been on ezetimibe.  The patient is diabetic and is on glimepiride and metformin.  Patient has normal renal function.  His EKG today shows inferolateral ST segment inversion similar to July 2015  Home Medications  Prior to Admission medications   Medication Sig Start Date End Date Taking? Authorizing Provider  aspirin 81 MG chewable tablet Chew 1 tablet (81 mg total) by mouth daily. 06/19/14  Yes Almyra Deforest, PA  clopidogrel (PLAVIX) 75 MG tablet Take 1 tablet (75 mg total) by mouth daily with breakfast. 06/19/14  Yes Almyra Deforest, PA  ezetimibe (ZETIA) 10 MG tablet Take 1 tablet (10 mg total) by mouth daily. 08/07/14  Yes Lendon Colonel, NP  glimepiride (AMARYL) 2 MG tablet Take 2 mg by mouth daily.    Yes Historical Provider, MD  losartan (COZAAR) 50 MG tablet Take 1 tablet (50 mg total) by mouth daily. 06/19/14  Yes Almyra Deforest, PA  lovastatin (MEVACOR) 20 MG tablet Take 1 tablet (20 mg total) by mouth at bedtime. 10/13/14  Yes Arnoldo Lenis, MD  metFORMIN (GLUCOPHAGE) 500 MG tablet Take 500 mg by mouth 2 (two) times daily with a meal.   Yes Historical Provider, MD  metoprolol succinate (TOPROL-XL) 25 MG 24 hr tablet Take 1 tablet (25 mg total) by mouth daily.  06/19/14  Yes Almyra Deforest, PA    Family History  History reviewed.  His mother is still alive at 63.  His father was killed at a young age.  No history of premature CAD  Social History  History   Social History  . Marital Status: Married    Spouse Name: N/A  . Number of Children: N/A  . Years of Education: N/A   Occupational History  . Not on file.   Social History Main Topics  . Smoking status: Former Smoker -- 1.00 packs/day for 30 years    Types: Cigarettes    Quit date: 03/11/1993  . Smokeless tobacco: Never Used  . Alcohol Use: No  . Drug Use: No  . Sexual Activity: Not on file   Other Topics Concern  . Not on file   Social History Narrative     Review of Systems General:  No chills, fever, night sweats or weight changes.  Cardiovascular:  No chest pain, dyspnea on exertion, edema, orthopnea, palpitations, paroxysmal nocturnal dyspnea. Dermatological: No rash, lesions/masses Respiratory: No cough, dyspnea Urologic: No hematuria, dysuria Abdominal:   No nausea, vomiting, diarrhea, bright red blood per rectum, melena, or hematemesis Neurologic:  No visual changes, wkns, changes in mental status. All other systems reviewed and are otherwise negative except as noted above.  Physical Exam  Blood pressure 137/80, pulse 75, temperature 97.8 F (36.6 C), temperature source Oral, resp. rate 16, height 5\' 8"  (1.727 m), weight 205 lb (92.987 kg), SpO2 97 %.  General: Pleasant, NAD Psych: Normal affect. Neuro: Alert and oriented X 3. Moves all extremities spontaneously. HEENT: Normal  Neck: Supple without bruits or JVD. Lungs:  Resp regular and unlabored, CTA. Heart: RRR no s3, s4, or murmurs. Abdomen: Soft, non-tender, non-distended, BS + x 4.  Extremities: No clubbing, cyanosis or edema. DP/PT/Radials 2+ and equal bilaterally.  Labs  Troponin [facility] of Care Test)  Recent Labs  02/06/15 0631  TROPIPOC 0.62*    Recent Labs  02/06/15 0620  TROPONINI 0.98*     Lab Results  Component Value Date   WBC 7.4 02/06/2015   HGB 15.6 02/06/2015   HCT 46.0 02/06/2015   MCV 90.0 02/06/2015   PLT 222 02/06/2015     Recent Labs Lab 02/06/15 0633  NA 139  K 3.9  CL 105  BUN 22  CREATININE 1.10  GLUCOSE 149*   Lab Results  Component Value Date   CHOL 222* 10/13/2014   HDL 33* 10/13/2014   LDLCALC 149* 10/13/2014   TRIG 201* 10/13/2014   No results found for: DDIMER   Radiology/Studies  Dg Chest 2 View  02/06/2015   CLINICAL DATA:  Coronary artery disease.  24 hours of left arm pain  EXAM: CHEST  2 VIEW  COMPARISON:  July 31, 2012  FINDINGS: Lungs are clear. Heart size and pulmonary vascularity are normal. No adenopathy. No pneumothorax. No bone lesions.  IMPRESSION: No edema or consolidation.   Electronically Signed   By: Gwyndolyn Saxon  Jasmine December III M.D.   On: 02/06/2015 07:09    ECG  Normal sinus rhythm.  Inferolateral T-wave inversion.  Personally reviewed.  ASSESSMENT AND PLAN  1.  Non-STEMI with left arm discomfort and mildly elevated troponin.  Clinical presentation is similar to prior non-STEMI. 2.  Diabetes mellitus 3.  Hypercholesterolemia 4.  Essential hypertension 5.  Intolerance to Lipitor and pravastatin  Plan: Admit to cardiology.  IV heparin has already been started.  Serial cardiac enzymes.  We will plan on cardiac catheterization today.  Hold metformin.  Needs tighter control of his lipids.  Consider addition of low dose Crestor.  Signed, Darlin Coco, MD  02/06/2015, 9:16 AM

## 2015-02-06 NOTE — Progress Notes (Signed)
UR Completed Mishal Probert Graves-Bigelow, RN,BSN [phone]  

## 2015-02-06 NOTE — ED Notes (Signed)
Spoke with cath lab, christy. She states pt is scheduled at this time to be next in aproxiately 1 hr.

## 2015-02-06 NOTE — ED Provider Notes (Addendum)
CSN: [id number]     Arrival date & time 02/06/15  0541 History   First MD Initiated Contact with Patient 02/06/15 0600     Chief Complaint  Patient presents with  . Arm Pain    Medical screening examination/treatment/procedure(s) were conducted as a shared visit with non-physician practitioner(s) and myself.  I personally evaluated the patient during the encounter.   EKG Interpretation   Date/Time:  Friday February 06 2015 05:47:49 EST Ventricular Rate:  94 PR Interval:  147 QRS Duration: 91 QT Interval:  358 QTC Calculation: 448 R Axis:   109 Text Interpretation:  Sinus rhythm Anterior infarct, old non specific st  changes Confirmed by [facility]  MD, Percilla Tweten ([id number]) on 02/06/2015 5:56:12  AM      (Consider location/radiation/quality/duration/timing/severity/associated sxs/prior Treatment) Patient is a 69 y.o. male presenting with arm pain. The history is provided by the patient.  Arm Pain This is a new problem. The current episode started 6 to 12 hours ago. The problem has not changed since onset.Pertinent negatives include no abdominal pain, no headaches and no shortness of breath. Nothing aggravates the symptoms. Nothing relieves the symptoms. He has tried nothing for the symptoms. The treatment provided no relief.  Intermittent arm pain that started at rest  Past Medical History  Diagnosis Date  . Diabetes mellitus, type 2   . Hypertension   . Bladder cancer   . Elevated PSA   . CAD (coronary artery disease)     a. NSTEMI s/p PTCA of LCx, 60-80% residual in distal LAD, 70% prox small D1. Echo EF 50-55%  . HOH (hard of hearing)    Past Surgical History  Procedure Laterality Date  . Transurethral resection of bladder tumor N/A 03/14/2013    Procedure: TRANSURETHRAL RESECTION OF BLADDER TUMOR (TURBT);  Surgeon: [name], MD;  Location: [facility];  Service: Urology;  Laterality: N/A;  1 HR WITH MITOMYCIN INSTILLATION   . Transurethral resection of  bladder tumor N/A 09/30/2013    Procedure: TRANSURETHRAL RESECTION OF BLADDER TUMOR (TURBT);  Surgeon: [name], MD;  Location: [facility];  Service: Urology;  Laterality: N/A;  . Transurethral resection of bladder tumor N/A 03/31/2014    Procedure: TRANSURETHRAL RESECTION OF BLADDER TUMOR (TURBT);  Surgeon: [name], MD;  Location: [facility];  Service: Urology;  Laterality: N/A;  . Prostate biopsy N/A 03/31/2014    Procedure: BIOPSY TRANSRECTAL ULTRASONIC PROSTATE (TUBP);  Surgeon: [name], MD;  Location: [facility];  Service: Urology;  Laterality: N/A;  . Left heart catheterization with coronary angiogram N/A 06/17/2014    Procedure: LEFT HEART CATHETERIZATION WITH CORONARY ANGIOGRAM;  Surgeon: [name], MD;  Location: [facility] CATH LAB;  Service: Cardiovascular;  Laterality: N/A;   No family history on file. History  Substance Use Topics  . Smoking status: Former Smoker -- 1.00 packs/day for 30 years    Types: Cigarettes    Quit date: 03/11/1993  . Smokeless tobacco: Never Used  . Alcohol Use: No    Review of Systems  Constitutional: Negative for fever.  Respiratory: Negative for shortness of breath.   Gastrointestinal: Negative for abdominal pain.  Neurological: Negative for headaches.  All other systems reviewed and are negative.     Allergies  Lipitor and Pravastatin  Home Medications   Prior to Admission medications   Medication Sig Start Date End Date Taking? Authorizing Provider  aspirin 81 MG chewable tablet Chew 1 tablet (81 mg total) by mouth  daily. 06/19/14  Yes Almyra Deforest, PA  clopidogrel (PLAVIX) 75 MG tablet Take 1 tablet (75 mg total) by mouth daily with breakfast. 06/19/14  Yes Almyra Deforest, PA  ezetimibe (ZETIA) 10 MG tablet Take 1 tablet (10 mg total) by mouth daily. 08/07/14  Yes Lendon Colonel, NP  glimepiride (AMARYL) 2 MG tablet Take 2 mg by mouth daily.    Yes Historical Provider, MD   losartan (COZAAR) 50 MG tablet Take 1 tablet (50 mg total) by mouth daily. 06/19/14  Yes Almyra Deforest, PA  lovastatin (MEVACOR) 20 MG tablet Take 1 tablet (20 mg total) by mouth at bedtime. 10/13/14  Yes Arnoldo Lenis, MD  metFORMIN (GLUCOPHAGE) 500 MG tablet Take 500 mg by mouth 2 (two) times daily with a meal.   Yes Historical Provider, MD  metoprolol succinate (TOPROL-XL) 25 MG 24 hr tablet Take 1 tablet (25 mg total) by mouth daily. 06/19/14  Yes Hao Meng, PA   BP 132/73 mmHg  Pulse 78  Temp(Src) 97.8 F (36.6 C) (Oral)  Resp 18  Ht 5\' 8"  (1.727 m)  Wt 205 lb (92.987 kg)  BMI 31.18 kg/m2  SpO2 94% Physical Exam  Constitutional: He is oriented to person, place, and time. He appears well-developed and well-nourished. No distress.  HENT:  Head: Normocephalic and atraumatic.  Eyes: Conjunctivae and EOM are normal.  Neck: Normal range of motion. Neck supple.  Cardiovascular: Normal rate and regular rhythm.   Pulmonary/Chest: Effort normal and breath sounds normal. No respiratory distress. He has no wheezes. He has no rales.  Abdominal: Soft. Bowel sounds are normal. There is no tenderness. There is no rebound and no guarding.  Musculoskeletal: Normal range of motion. He exhibits no edema or tenderness.       Left elbow: Normal. No tenderness found. No radial head, no medial epicondyle, no lateral epicondyle and no olecranon process tenderness noted.       Left upper arm: Normal. He exhibits no tenderness, no bony tenderness, no swelling, no edema, no deformity and no laceration.  Neurological: He is alert and oriented to person, place, and time.  Skin: Skin is warm and dry.  Psychiatric: He has a normal mood and affect.    ED Course  Procedures (including critical care time) Labs Review Labs Reviewed  CBC WITH DIFFERENTIAL/PLATELET - Abnormal; Notable for the following:    Monocytes Relative 17 (*)    Monocytes Absolute 1.2 (*)    All other components within normal limits  I-STAT  CHEM 8, ED - Abnormal; Notable for the following:    Glucose, Bld 149 (*)    Calcium, Ion 1.07 (*)    All other components within normal limits  I-STAT TROPOININ, ED - Abnormal; Notable for the following:    Troponin i, poc 0.62 (*)    All other components within normal limits  TROPONIN I    Imaging Review Dg Chest 2 View  02/06/2015   CLINICAL DATA:  Coronary artery disease.  24 hours of left arm pain  EXAM: CHEST  2 VIEW  COMPARISON:  July 31, 2012  FINDINGS: Lungs are clear. Heart size and pulmonary vascularity are normal. No adenopathy. No pneumothorax. No bone lesions.  IMPRESSION: No edema or consolidation.   Electronically Signed   By: Lowella Grip III M.D.   On: 02/06/2015 07:09     EKG Interpretation   Date/Time:  Friday February 06 2015 05:47:49 EST Ventricular Rate:  94 PR Interval:  147 QRS Duration: 91  QT Interval:  358 QTC Calculation: 448 R Axis:   109 Text Interpretation:  Sinus rhythm Anterior infarct, old non specific st  changes Confirmed by [facility]  MD, Kaleesi Guyton ([id number]) on 02/06/2015 5:56:12  AM      MDM   Final diagnoses:  Pain    Case d/w Dr. Sung Amabile, wants patient admitted to medicine as Istat troponin is too sensitive.  EDP stated we would call out with results   Did not have CP with last NSTEMI needs admission.    831 am Case d/w Trish card master regular troponin is .98 we had started heparin and NTG paste.  Wannetta Sender is aware and is sending a cardiologist    Lorren Rossetti K Eveleigh Crumpler-Rasch, MD 02/06/15 [phone]

## 2015-02-06 NOTE — ED Notes (Signed)
Patient corrected himself to this nurse and said it was yesterday (Thursday) morning when his pain started.  Stated "you know sometimes your days run together"

## 2015-02-07 ENCOUNTER — Encounter (HOSPITAL_COMMUNITY): Payer: Self-pay | Admitting: Physician Assistant

## 2015-02-07 DIAGNOSIS — I2510 Atherosclerotic heart disease of native coronary artery without angina pectoris: Secondary | ICD-10-CM | POA: Diagnosis present

## 2015-02-07 DIAGNOSIS — E785 Hyperlipidemia, unspecified: Secondary | ICD-10-CM

## 2015-02-07 DIAGNOSIS — H9190 Unspecified hearing loss, unspecified ear: Secondary | ICD-10-CM

## 2015-02-07 DIAGNOSIS — E876 Hypokalemia: Secondary | ICD-10-CM

## 2015-02-07 DIAGNOSIS — Z9861 Coronary angioplasty status: Secondary | ICD-10-CM

## 2015-02-07 DIAGNOSIS — I10 Essential (primary) hypertension: Secondary | ICD-10-CM

## 2015-02-07 LAB — CBC
HCT: 39.2 % (ref 39.0–52.0)
Hemoglobin: 13.4 g/dL (ref 13.0–17.0)
MCH: 30.2 pg (ref 26.0–34.0)
MCHC: 34.2 g/dL (ref 30.0–36.0)
MCV: 88.5 fL (ref 78.0–100.0)
PLATELETS: 228 10*3/uL (ref 150–400)
RBC: 4.43 MIL/uL (ref 4.22–5.81)
RDW: 15 % (ref 11.5–15.5)
WBC: 6.8 10*3/uL (ref 4.0–10.5)

## 2015-02-07 LAB — BASIC METABOLIC PANEL
ANION GAP: 9 (ref 5–15)
BUN: 14 mg/dL (ref 6–23)
CO2: 24 mmol/L (ref 19–32)
Calcium: 8.2 mg/dL — ABNORMAL LOW (ref 8.4–10.5)
Chloride: 104 mmol/L (ref 96–112)
Creatinine, Ser: 1.25 mg/dL (ref 0.50–1.35)
GFR calc Af Amer: 67 mL/min — ABNORMAL LOW (ref >90)
GFR, EST NON AFRICAN AMERICAN: 58 mL/min — AB (ref >90)
GLUCOSE: 140 mg/dL — AB (ref 70–99)
Potassium: 3.4 mmol/L — ABNORMAL LOW (ref 3.5–5.1)
SODIUM: 137 mmol/L (ref 135–145)

## 2015-02-07 LAB — TROPONIN I: Troponin I: 6.96 ng/mL (ref <0.031)

## 2015-02-07 LAB — HEMOGLOBIN A1C
Hgb A1c MFr Bld: 6.3 % — ABNORMAL HIGH (ref 4.8–5.6)
Mean Plasma Glucose: 134 mg/dL

## 2015-02-07 LAB — GLUCOSE, CAPILLARY: Glucose-Capillary: 121 mg/dL — ABNORMAL HIGH (ref 70–99)

## 2015-02-07 MED ORDER — POTASSIUM CHLORIDE CRYS ER 20 MEQ PO TBCR
40.0000 meq | EXTENDED_RELEASE_TABLET | Freq: Once | ORAL | Status: AC
  Administered 2015-02-07: 40 meq via ORAL
  Filled 2015-02-07: qty 2

## 2015-02-07 MED ORDER — ROSUVASTATIN CALCIUM 5 MG PO TABS: 5.0000 mg | ORAL_TABLET | Freq: Every day | ORAL | Status: DC

## 2015-02-07 MED ORDER — NITROGLYCERIN 0.4 MG SL SUBL: 0.4000 mg | SUBLINGUAL_TABLET | SUBLINGUAL | Status: DC | PRN

## 2015-02-07 MED ORDER — METFORMIN HCL 500 MG PO TABS: 500.0000 mg | ORAL_TABLET | Freq: Two times a day (BID) | ORAL | Status: DC

## 2015-02-07 NOTE — Progress Notes (Signed)
Patient Name: Timothy [name] Date of Encounter: 02/07/2015     Active Problems:   NSTEMI (non-ST elevated myocardial infarction)   CAD S/P percutaneous coronary angioplasty    SUBJECTIVE  No cp or SOB. Ready to go home.   CURRENT MEDS . aspirin  81 mg Oral Daily  . clopidogrel  75 mg Oral Daily  . ezetimibe  10 mg Oral Daily  . heparin  5,000 Units Subcutaneous 3 times per day  . insulin aspart  0-15 Units Subcutaneous TID WC  . losartan  50 mg Oral Daily  . metoprolol succinate  25 mg Oral Daily  . rosuvastatin  5 mg Oral Daily  . sodium chloride  3 mL Intravenous Q12H    OBJECTIVE  Filed Vitals:   02/07/15 0008 02/07/15 0200 02/07/15 0300 02/07/15 0656  BP: 120/61 109/68    Pulse: 97 79    Temp: 100.5 F (38.1 C) 98.8 F (37.1 C) 98 F (36.7 C)   TempSrc: Oral Oral    Resp: 20 20    Height:      Weight:    243 lb 2.7 oz (110.3 kg)  SpO2: 93% 94%      Intake/Output Summary (Last 24 hours) at 02/07/15 0815 Last data filed at 02/07/15 0000  Gross per 24 hour  Intake  73.75 ml  Output    400 ml  Net -326.25 ml   Filed Weights   02/06/15 0552 02/06/15 1221 02/07/15 0656  Weight: 205 lb (92.987 kg) 197 lb 1.5 oz (89.4 kg) 243 lb 2.7 oz (110.3 kg)    PHYSICAL EXAM  General: Pleasant, NAD. Neuro: Alert and oriented X 3. Moves all extremities spontaneously. Psych: Normal affect. HEENT:  Normal  Neck: Supple without bruits or JVD. Lungs:  Resp regular and unlabored, CTA. Heart: RRR no s3, s4, or murmurs. Abdomen: Soft, non-tender, non-distended, BS + x 4.  Extremities: No clubbing, cyanosis or edema. DP/PT/Radials 2+ and equal bilaterally.  Accessory Clinical Findings  CBC  Recent Labs  02/06/15 0620 02/06/15 0633 02/07/15 0038  WBC 7.4  --  6.8  NEUTROABS 4.6  --   --   HGB 14.5 15.6 13.4  HCT 42.1 46.0 39.2  MCV 90.0  --  88.5  PLT 222  --  124   Basic Metabolic Panel  Recent Labs  02/06/15 1338 02/07/15 0038  NA 138 137  K 3.8  3.4*  CL 107 104  CO2 21 24  GLUCOSE 97 140*  BUN 15 14  CREATININE 1.14 1.25  CALCIUM 8.3* 8.2*  MG 2.2  --    Liver Function Tests  Recent Labs  02/06/15 1338  AST 44*  ALT 37  ALKPHOS 102  BILITOT 0.9  PROT 6.5  ALBUMIN 3.5   No results for input(s): LIPASE, AMYLASE in the last 72 hours. Cardiac Enzymes  Recent Labs  02/06/15 1338 02/06/15 1940 02/07/15 0038  TROPONINI 1.33* 6.93* 6.96*    Thyroid Function Tests  Recent Labs  02/06/15 1338  TSH 1.805    TELE  NSR  Radiology/Studies  Dg Chest 2 View  02/06/2015   CLINICAL DATA:  Coronary artery disease.  24 hours of left arm pain  EXAM: CHEST  2 VIEW  COMPARISON:  July 31, 2012  FINDINGS: Lungs are clear. Heart size and pulmonary vascularity are normal. No adenopathy. No pneumothorax. No bone lesions.  IMPRESSION: No edema or consolidation.   Electronically Signed   By: Lowella Grip III M.D.  On: 02/06/2015 07:09    ASSESSMENT AND PLAN  Timothy [name] is a 69 y.o. male with a history of DMT2, HTN, bladder CA, CAD s/p PCTA of dLCx/OM (06/2014) who presented to [facility] ED on 02/06/15 with left arm pain and found to have NSTEMI.   NSTEMI- -- Troponin peaked at 6.96.  -- s/p LHC 02/06/15 which revealed severe multivessel disease with the culprit lesion and most significant lesion being the 95-99% mid RCA stenosis. Remaining lesions are similar to prior catheterization. Normal LVEDP. He is now s/p successful PCI/DES to mid RCA. There was significant restenosis in the distal AV groove circumflex at the previous PTCA site. Still not amenable PCI with stent. At this time would treat this medically. -- Per cath note, Dr. Ellyn Hack recommended medical management for existing disease. If the patient were to have symptoms, would consider noninvasive evaluation to determine if the distal LAD is significant. Diagonal branch is probably not amenable to anything more than balloon angioplasty. -- Cont DAPT w/ ASA/plavix, BB  and low dose of rosuvastatin ( statin intolerant)  -- Radial site stable  Hypokalemia- mild. Will replete with 33mEq Kdur this AM.  HLD- statin intolerant. Continue Zetia. He was placed on 5mg  Crestor while inpatient and seems to be tolerating this well.   HTN- well controlled on losartan 50mg  qd and toprol xl 25mg  qd.   DM- will hold metformin >48 hours post contrast dye exposure. He can resume Monday morning. Continue home regimen.   Judy Pimple PA-C  Pager [phone]  The patient was seen and examined, and I agree with the assessment and plan as documented above. Pt feeling well after PCI. Denies chest pain and shortness of breath. Continue DAPT and medical management for residual CAD. Will arrange for f/u with Dr. Harl Bowie. Stable for discharge. Will need cardiac rehab at [facility].

## 2015-02-07 NOTE — Progress Notes (Signed)
CARDIAC REHAB PHASE I   PRE:  Rate/Rhythm: Sinus 73  BP:  Supine: 130/77      SaO2: 94% Room Air  MODE:  Ambulation: 600 ft   POST:  Rate/Rhythem: 84  BP:  Supine: 133/77       SaO2: 96%   [id number]  Patient ambulated independently in the hallway without complaints or chest pain.  Reviewed heart healthy diabetic diet, exercise instructions with the patient and his wife. Mr Episcopo says he is not interested in phase 2 cardiac rehab at [facility] due to cost. Patient says he will walk at home use his treadmill and will look into getting a [employer].  Perian Tedder, Christa See RN BSN

## 2015-02-07 NOTE — Discharge Instructions (Signed)

## 2015-02-07 NOTE — Discharge Summary (Signed)
Discharge Summary   Patient ID: Timothy [name] MRN: [id number], DOB/AGE: 05-24-1946 69 y.o. Admit date: 02/06/2015 D/C date:     02/07/2015  Primary Cardiologist: Dr. Harl [name]  Principal Problem:   NSTEMI (non-ST elevated myocardial infarction) Active Problems:   Hypertension   Diabetes mellitus, type 2   Bladder cancer   HOH (hard of hearing)   HLD (hyperlipidemia)   CAD (coronary artery disease)    Admission Dates: 02/06/15-02/07/15 Discharge Diagnosis: NSTEMI s/p successful PCI/DES to mid RCA  HPI: Timothy [name] is a 69 y.o. male with a history of DMT2, HTN, bladder CA, CAD s/p PCTA of dLCx/OM (06/2014) who presented to [facility] ED on 02/06/15 with left arm pain and found to have NSTEMI.    Hospital Course  NSTEMI- -- Troponin peaked at 6.96. No further CP or SOB. Ready to go home.  -- s/p [facility] 02/06/15 which revealed severe multivessel disease with the culprit lesion and most significant lesion being the 95-99% mid RCA stenosis. Remaining lesions are similar to prior catheterization. Normal LVEDP. He is now s/p successful PCI/DES to mid RCA. There was significant restenosis in the distal AV groove circumflex at the previous PTCA site. Still not amenable PCI with stent. At this time would treat this medically. -- Per cath note, Dr. Ellyn [name] recommended medical management for existing disease. If the patient were to have symptoms, would consider noninvasive evaluation to determine if the distal LAD is significant. Diagonal branch is probably not amenable to anything more than balloon angioplasty. -- Cont DAPT w/ ASA/plavix, BB and low dose of rosuvastatin ( statin intolerant)  -- Radial site stable -- Will proceed with cardiac rehab at [facility].  Hypokalemia- mild. Will replete with 35mEq Kdur this AM. BMET at follow up   HLD- statin intolerant. Continue Zetia. He was placed on 5mg  Crestor while inpatient and seems to be tolerating this well.   HTN- well controlled on losartan 50mg   qd and toprol xl 25mg  qd.   DM- will hold metformin >48 hours post contrast dye exposure. He can resume Monday morning. Continue home regimen.   The patient has had an uncomplicated hospital course and is recovering well. The radial catheter site is stable. He has been seen by Dr. Bronson [name] today and deemed ready for discharge home. A message was sent to Timothy [name] to arrange for f/u in [place] with Dr Timothy [name] during normal business hours. Discharge medications are listed below.   Discharge Vitals: Blood pressure 125/73, pulse 80, temperature 98.5 F (36.9 C), temperature source Oral, resp. rate 20, height 5\' 8"  (1.727 m), weight 243 lb 2.7 oz (110.3 kg), SpO2 96 %.  Labs: Lab Results  Component Value Date   WBC 6.8 02/07/2015   HGB 13.4 02/07/2015   HCT 39.2 02/07/2015   MCV 88.5 02/07/2015   PLT 228 02/07/2015     Recent Labs Lab 02/06/15 1338 02/07/15 0038  NA 138 137  K 3.8 3.4*  CL 107 104  CO2 21 24  BUN 15 14  CREATININE 1.14 1.25  CALCIUM 8.3* 8.2*  PROT 6.5  --   BILITOT 0.9  --   ALKPHOS 102  --   ALT 37  --   AST 44*  --   GLUCOSE 97 140*    Recent Labs  02/06/15 0620 02/06/15 1338 02/06/15 1940 02/07/15 0038  TROPONINI 0.98* 1.33* 6.93* 6.96*     Diagnostic Studies/Procedures   Dg Chest 2 View  02/06/2015   CLINICAL DATA:  Coronary artery  disease.  24 hours of left arm pain  EXAM: CHEST  2 VIEW  COMPARISON:  July 31, 2012  FINDINGS: Lungs are clear. Heart size and pulmonary vascularity are normal. No adenopathy. No pneumothorax. No bone lesions.  IMPRESSION: No edema or consolidation.   Electronically Signed   By: Lowella Grip III M.D.   On: 02/06/2015 07:09     CARDIAC CATHETERIZATION AND PERCUTANEOUS CORONARY INTERVENTION REPORT Procedure Date: 02/06/2015 FINDINGS:  Hemodynamics:   Central Aortic Pressure / Mean: 103/70/85 mmHg  Left Ventricular Pressure / LVEDP: 99/5/7 mmHg Left Ventriculography: Deferred POST-OPERATIVE  DIAGNOSIS:   Severe multivessel disease with the culprit lesion and most significant lesion being the 95-99% mid RCA stenosis. This is clearly the culprit lesion as the remaining lesions are similar to prior catheterization.  Successful focal PCI on the mid RCA with a Xience alpine DES stent 2.5 mm a 12 mm postdilated and tapered fashion from 3.1 down to 2.75 mm in tapering fashion.  Normal LVEDP  Significant restenosis in the distal AV groove circumflex at the previous PTCA site. Still not amenable PCI with stent. At this time would treat this medically.  PLAN OF CARE:  Return to nursing unit for standard post radial PCI care.  Can you dual antiplatelet therapy as the patient now has a DES stent. Would continue for at least one year.  Can you medical management for existing disease. The patient were to have symptoms, would consider noninvasive evaluation to determine if the distal LAD is significant. Diagonal branch is probably not amenable to anything more than balloon angioplasty.  Continue home dose of beta blocker. He is on Zetia due to statin intolerance.    Discharge Medications     Medication List    STOP taking these medications        lovastatin 20 MG tablet  Commonly known as:  MEVACOR      TAKE these medications        aspirin 81 MG chewable tablet  Chew 1 tablet (81 mg total) by mouth daily.     clopidogrel 75 MG tablet  Commonly known as:  PLAVIX  Take 1 tablet (75 mg total) by mouth daily with breakfast.     ezetimibe 10 MG tablet  Commonly known as:  ZETIA  Take 1 tablet (10 mg total) by mouth daily.     glimepiride 2 MG tablet  Commonly known as:  AMARYL  Take 2 mg by mouth daily.     losartan 50 MG tablet  Commonly known as:  COZAAR  Take 1 tablet (50 mg total) by mouth daily.     metFORMIN 500 MG tablet  Commonly known as:  GLUCOPHAGE  Take 1 tablet (500 mg total) by mouth 2 (two) times daily with a meal.  Start taking on:  02/09/2015      metoprolol succinate 25 MG 24 hr tablet  Commonly known as:  TOPROL-XL  Take 1 tablet (25 mg total) by mouth daily.     nitroGLYCERIN 0.4 MG SL tablet  Commonly known as:  NITROSTAT  Place 1 tablet (0.4 mg total) under the tongue every 5 (five) minutes x 3 doses as needed for chest pain.     rosuvastatin 5 MG tablet  Commonly known as:  CRESTOR  Take 1 tablet (5 mg total) by mouth daily.        Disposition   The patient will be discharged in stable condition to home.  Follow-up Information    Follow up  with Arnoldo Lenis, MD.   Specialty:  Cardiology   Why:  The office will call you to make an appoinment., If you do not hear from them, please contact them., You should be seen within 1-2 weeks.   Contact information:   [address] [place]  [address] [phone]         Duration of Discharge Encounter: Greater than 30 minutes including physician and PA time.  Signed, Grandville Silos, Damontre Millea R PA-C 02/07/2015, 10:08 AM

## 2015-02-10 ENCOUNTER — Ambulatory Visit: Payer: Medicare Other | Admitting: Urology

## 2015-02-24 ENCOUNTER — Ambulatory Visit (INDEPENDENT_AMBULATORY_CARE_PROVIDER_SITE_OTHER): Payer: Medicare Other | Admitting: Urology

## 2015-02-24 DIAGNOSIS — C674 Malignant neoplasm of posterior wall of bladder: Secondary | ICD-10-CM | POA: Diagnosis not present

## 2015-02-26 ENCOUNTER — Ambulatory Visit (INDEPENDENT_AMBULATORY_CARE_PROVIDER_SITE_OTHER): Payer: Medicare Other | Admitting: Adult Health

## 2015-02-26 ENCOUNTER — Encounter: Payer: Self-pay | Admitting: Adult Health

## 2015-02-26 VITALS — BP 126/78 | HR 84 | Ht 68.0 in | Wt 198.0 lb

## 2015-02-26 DIAGNOSIS — I10 Essential (primary) hypertension: Secondary | ICD-10-CM

## 2015-02-26 DIAGNOSIS — E7800 Pure hypercholesterolemia, unspecified: Secondary | ICD-10-CM

## 2015-02-26 DIAGNOSIS — I2510 Atherosclerotic heart disease of native coronary artery without angina pectoris: Secondary | ICD-10-CM

## 2015-02-26 MED ORDER — ATORVASTATIN CALCIUM 80 MG PO TABS: 80.0000 mg | ORAL_TABLET | Freq: Every day | ORAL | Status: DC

## 2015-02-26 NOTE — Patient Instructions (Signed)
Your physician recommends that you schedule a follow-up appointment in: 1 month    STOP Crestor   START Atorvastatin 80 mg at dinner for your cholesterol     Thank you for choosing [facility] !

## 2015-02-26 NOTE — Progress Notes (Signed)
Cardiology Office Note   Date:  02/26/2015   ID:  Timothy [name], DOB [birth year], MRN [id number]  PCP:  Glo Herring., MD  Cardiologist:  Cloria Spring, NP in a, and  Chief Complaint  Patient presents with  . Coronary Artery Disease    S/P PTCA of Cx and OM   . Hypertension  . Hyperlipidemia      History of Present Illness: Timothy [name] is a 69 y.o. male who presents for ongoing assessment and management of CAD, status post hospitalization on 3/4 /2016in the setting of non-ST elevation MI.  The patient has a history of PCI of the circumflex and OM in July 2015.  He underwent left heart catheterization on 02/06/2015, which revealed severe multivessel disease with culprit lesion, and most significant lesion being 95-99% in the mid right coronary artery.  The remaining lesions were similar to prior catheterization.  There was significant restenosis in the distal AV groove circumflex at the previous PTCA site.the patient had PCI of the mid RCA with a drug-eluting stent.  Dr. Ellyn Hack recommended medical management for residual disease.  If the patient continued to have symptoms would consider noninvasive evaluation to determine if the distal LAD is significant.  He was to continue dual antiplatelet therapy.  He was advised to continue cardiac rehabilitation and he can hospital.  He was noted as having hypokalemia, which was repleted.  He will need to a BMET aon followup.  He comes today without recurrent symptoms. He is concerned about the cost of Crestor. Wishes to change to something different that is more affordable. He also does not know what each medication is for. He asks that I go over the medications with him.  He denies recurrent arm pain, which is his anginal equivalent.     Past Medical History  Diagnosis Date  . Diabetes mellitus, type 2   . Hypertension   . Bladder cancer   . Elevated PSA   . CAD (coronary artery disease)     a. 06/2014: NSTEMI s/p PTCA of  LCx, 60-80% residual in distal LAD, 70% prox small D1.   b. 02/06/15 NSTEMI  s/p successful PCI/DES to mid RCA  . HOH (hard of hearing)   . HLD (hyperlipidemia)     Past Surgical History  Procedure Laterality Date  . Transurethral resection of bladder tumor N/A 03/14/2013    Procedure: TRANSURETHRAL RESECTION OF BLADDER TUMOR (TURBT);  Surgeon: [name], MD;  Location: [facility];  Service: Urology;  Laterality: N/A;  1 HR WITH MITOMYCIN INSTILLATION   . Transurethral resection of bladder tumor N/A 09/30/2013    Procedure: TRANSURETHRAL RESECTION OF BLADDER TUMOR (TURBT);  Surgeon: [name], MD;  Location: [facility];  Service: Urology;  Laterality: N/A;  . Transurethral resection of bladder tumor N/A 03/31/2014    Procedure: TRANSURETHRAL RESECTION OF BLADDER TUMOR (TURBT);  Surgeon: [name], MD;  Location: [facility];  Service: Urology;  Laterality: N/A;  . Prostate biopsy N/A 03/31/2014    Procedure: BIOPSY TRANSRECTAL ULTRASONIC PROSTATE (TUBP);  Surgeon: [name], MD;  Location: [facility];  Service: Urology;  Laterality: N/A;  . Left heart catheterization with coronary angiogram N/A 06/17/2014    Procedure: LEFT HEART CATHETERIZATION WITH CORONARY ANGIOGRAM;  Surgeon: [name], MD;  Location: [facility] CATH LAB;  Service: Cardiovascular;  Laterality: N/A;  . Left heart catheterization with coronary angiogram N/A 02/06/2015    Procedure: LEFT HEART CATHETERIZATION WITH CORONARY ANGIOGRAM;  Surgeon: [name], MD;  Location: [facility] CATH LAB;  Service: Cardiovascular;  Laterality: N/A;  . Cardiac catheterization  02/06/2015    Procedure: CORONARY STENT INTERVENTION;  Surgeon: [name], MD;  Location: [facility] CATH LAB;  Service: Cardiovascular;;  Mid RCA     Current Outpatient Prescriptions  Medication Sig Dispense Refill  . aspirin 81 MG chewable tablet Chew 1 tablet (81 mg total) by mouth  daily.    . clopidogrel (PLAVIX) 75 MG tablet Take 1 tablet (75 mg total) by mouth daily with breakfast. 30 tablet 12  . ezetimibe (ZETIA) 10 MG tablet Take 1 tablet (10 mg total) by mouth daily. 90 tablet 3  . glimepiride (AMARYL) 2 MG tablet Take 2 mg by mouth daily.     Marland Kitchen losartan (COZAAR) 50 MG tablet Take 1 tablet (50 mg total) by mouth daily. 30 tablet 12  . metFORMIN (GLUCOPHAGE) 500 MG tablet Take 1 tablet (500 mg total) by mouth 2 (two) times daily with a meal. 60 tablet 11  . metoprolol succinate (TOPROL-XL) 25 MG 24 hr tablet Take 1 tablet (25 mg total) by mouth daily. 30 tablet 12  . nitroGLYCERIN (NITROSTAT) 0.4 MG SL tablet Place 1 tablet (0.4 mg total) under the tongue every 5 (five) minutes x 3 doses as needed for chest pain. 25 tablet 12  . rosuvastatin (CRESTOR) 5 MG tablet Take 1 tablet (5 mg total) by mouth daily. 30 tablet 11   No current facility-administered medications for this visit.   Facility-Administered Medications Ordered in Other Visits  Medication Dose Route Frequency Provider Last Rate Last Dose  . mitoMYcin (MUTAMYCIN) chemo injection 40 mg  40 mg Bladder Instillation Once [name], MD        Allergies:   Lipitor and Pravastatin    Social History:  The patient  reports that he quit smoking about 21 years ago. His smoking use included Cigarettes. He has a 30 pack-year smoking history. He has never used smokeless tobacco. He reports that he does not drink alcohol or use illicit drugs.   Family History:  The patient's family history is not on file.    ROS: .   All other systems are reviewed and negative.Unless otherwise mentioned in andH&P above.   PHYSICAL EXAM: VS:  There were no vitals taken for this visit. , BMI There is no weight on file to calculate BMI. GEN: Well nourished, well developed, in no acute distress HEENT: normal Neck: no JVD, carotid bruits, or masses Cardiac RRR; no murmurs, rubs, or gallops,no edema  Respiratory:  clear to  auscultation bilaterally, normal work of breathing GI: soft, nontender, nondistended, + BS MS: no deformity or atrophy Skin: warm and dry, no rash Neuro:  Strength and sensation are intact Psych: euthymic mood, full affect  Recent Labs: 02/06/2015: ALT 37; Magnesium 2.2; TSH 1.805 02/07/2015: BUN 14; Creatinine 1.25; Hemoglobin 13.4; Platelets 228; Potassium 3.4*; Sodium 137    Lipid Panel    Component Value Date/Time   CHOL 222* 10/13/2014 0957   TRIG 201* 10/13/2014 0957   HDL 33* 10/13/2014 0957   CHOLHDL 6.7 10/13/2014 0957   VLDL 40 10/13/2014 0957   LDLCALC 149* 10/13/2014 0957      Wt Readings from Last 3 Encounters:  02/07/15 243 lb 2.7 oz (110.3 kg)  10/13/14 205 lb (92.987 kg)  07/04/14 206 lb (93.441 kg)      Other studies Reviewed: Additional studies/ records that were reviewed today include: Recent hospitalization Review of  the above records demonstrates: PCI to RCA (new)   ASSESSMENT AND PLAN:  1.  CAD: New PCI with DES to mid RCA. He is to continue DAPT indefinitely. I have gone over each of his medications with him and written down what the indicuation of the medications. He verbalizes understanding. Will change his Crestor to Medicaid formulary-atorvastatin 80 mg daily. He will continue BB and ARB. Will see him in one month.   2. Hypertension:  BP is well controlled. No changes.  3. Diabetes: He is to follow up on this with PCP.    Current medicines are reviewed at length with the patient today.  I have written down the indications beside each medication listed and discussed this with him.      Disposition:   FU with cardiology in one month to assess his response to change in medications.   Signed, Jory Sims, NP  02/26/2015 7:23 AM    [facility] [address] [place], [place] [address] Phone: [phone]; Fax: [phone]

## 2015-02-26 NOTE — Progress Notes (Deleted)
Name: Timothy [name]    DOB: Jan 02, 1946  Age: 69 y.o.  MR#: [id number]       PCP:  Glo Herring., MD      Insurance: Payor: [employer] MEDICARE / Plan: [facility] MEDICARE / Product Type: *No Product type* /   CC:    Chief Complaint  Patient presents with  . Coronary Artery Disease    S/P PTCA of Cx and OM   . Hypertension  . Hyperlipidemia    VS Filed Vitals:   02/26/15 1359  BP: 126/78  Pulse: 84  Height: 5\' 8"  (1.727 m)  Weight: 198 lb (89.812 kg)  SpO2: 97%    Weights Current Weight  02/26/15 198 lb (89.812 kg)  02/07/15 243 lb 2.7 oz (110.3 kg)  10/13/14 205 lb (92.987 kg)    Blood Pressure  BP Readings from Last 3 Encounters:  02/26/15 126/78  02/07/15 125/73  10/13/14 128/82     Admit date:  (Not on file) Last encounter with RMR:  Visit date not found   Allergy Lipitor and Pravastatin  Current Outpatient Prescriptions  Medication Sig Dispense Refill  . aspirin 81 MG chewable tablet Chew 1 tablet (81 mg total) by mouth daily.    . clopidogrel (PLAVIX) 75 MG tablet Take 1 tablet (75 mg total) by mouth daily with breakfast. 30 tablet 12  . glimepiride (AMARYL) 2 MG tablet Take 2 mg by mouth daily.     Marland Kitchen losartan (COZAAR) 50 MG tablet Take 1 tablet (50 mg total) by mouth daily. 30 tablet 12  . metFORMIN (GLUCOPHAGE) 500 MG tablet Take 1 tablet (500 mg total) by mouth 2 (two) times daily with a meal. 60 tablet 11  . metoprolol succinate (TOPROL-XL) 25 MG 24 hr tablet Take 1 tablet (25 mg total) by mouth daily. 30 tablet 12  . nitroGLYCERIN (NITROSTAT) 0.4 MG SL tablet Place 1 tablet (0.4 mg total) under the tongue every 5 (five) minutes x 3 doses as needed for chest pain. 25 tablet 12  . rosuvastatin (CRESTOR) 5 MG tablet Take 1 tablet (5 mg total) by mouth daily. 30 tablet 11   No current facility-administered medications for this visit.   Facility-Administered Medications Ordered in Other Visits  Medication Dose Route Frequency Provider Last Rate Last  Dose  . mitoMYcin (MUTAMYCIN) chemo injection 40 mg  40 mg Bladder Instillation Once [name], MD        Discontinued Meds:    Medications Discontinued During This Encounter  Medication Reason  . ezetimibe (ZETIA) 10 MG tablet Error    Patient Active Problem List   Diagnosis Date Noted  . HOH (hard of hearing)   . HLD (hyperlipidemia)   . CAD (coronary artery disease)   . Hypertension   . Diabetes mellitus, type 2   . Bladder cancer   . NSTEMI (non-ST elevated myocardial infarction) 06/17/2014    LABS    Component Value Date/Time   NA 137 02/07/2015 0038   NA 138 02/06/2015 1338   NA 139 02/06/2015 0633   K 3.4* 02/07/2015 0038   K 3.8 02/06/2015 1338   K 3.9 02/06/2015 0633   CL 104 02/07/2015 0038   CL 107 02/06/2015 1338   CL 105 02/06/2015 0633   CO2 24 02/07/2015 0038   CO2 21 02/06/2015 1338   CO2 26 06/18/2014 1245   GLUCOSE 140* 02/07/2015 0038   GLUCOSE 97 02/06/2015 1338   GLUCOSE 149* 02/06/2015 0633   BUN 14 02/07/2015 0038  BUN 15 02/06/2015 1338   BUN 22 02/06/2015 0633   CREATININE 1.25 02/07/2015 0038   CREATININE 1.14 02/06/2015 1338   CREATININE 1.10 02/06/2015 0633   CALCIUM 8.2* 02/07/2015 0038   CALCIUM 8.3* 02/06/2015 1338   CALCIUM 9.1 06/18/2014 1245   GFRNONAA 58* 02/07/2015 0038   GFRNONAA 65* 02/06/2015 1338   GFRNONAA 62* 06/18/2014 1245   GFRAA 67* 02/07/2015 0038   GFRAA 75* 02/06/2015 1338   GFRAA 72* 06/18/2014 1245   CMP     Component Value Date/Time   NA 137 02/07/2015 0038   K 3.4* 02/07/2015 0038   CL 104 02/07/2015 0038   CO2 24 02/07/2015 0038   GLUCOSE 140* 02/07/2015 0038   BUN 14 02/07/2015 0038   CREATININE 1.25 02/07/2015 0038   CALCIUM 8.2* 02/07/2015 0038   PROT 6.5 02/06/2015 1338   ALBUMIN 3.5 02/06/2015 1338   AST 44* 02/06/2015 1338   ALT 37 02/06/2015 1338   ALKPHOS 102 02/06/2015 1338   BILITOT 0.9 02/06/2015 1338   GFRNONAA 58* 02/07/2015 0038   GFRAA 67* 02/07/2015 0038        Component Value Date/Time   WBC 6.8 02/07/2015 0038   WBC 7.4 02/06/2015 0620   WBC 12.7* 06/19/2014 0330   HGB 13.4 02/07/2015 0038   HGB 15.6 02/06/2015 0633   HGB 14.5 02/06/2015 0620   HCT 39.2 02/07/2015 0038   HCT 46.0 02/06/2015 0633   HCT 42.1 02/06/2015 0620   MCV 88.5 02/07/2015 0038   MCV 90.0 02/06/2015 0620   MCV 92.5 06/19/2014 0330    Lipid Panel     Component Value Date/Time   CHOL 222* 10/13/2014 0957   TRIG 201* 10/13/2014 0957   HDL 33* 10/13/2014 0957   CHOLHDL 6.7 10/13/2014 0957   VLDL 40 10/13/2014 0957   LDLCALC 149* 10/13/2014 0957    ABG    Component Value Date/Time   TCO2 20 02/06/2015 0633     Lab Results  Component Value Date   TSH 1.805 02/06/2015   BNP (last 3 results) No results for input(s): BNP in the last 8760 hours.  ProBNP (last 3 results) No results for input(s): PROBNP in the last 8760 hours.  Cardiac Panel (last 3 results) No results for input(s): CKTOTAL, CKMB, TROPONINI, RELINDX in the last 72 hours.  Iron/TIBC/Ferritin/ %Sat No results found for: IRON, TIBC, FERRITIN, IRONPCTSAT   EKG Orders placed or performed during the hospital encounter of 02/06/15  . EKG 12-Lead  . EKG 12-Lead  . EKG 12-Lead immediately post procedure  . EKG 12-Lead  . EKG 12-Lead  . EKG 12-Lead  . EKG 12-Lead immediately post procedure  . EKG 12-Lead  . EKG 12-Lead  . EKG 12-Lead  . EKG  . EKG     Prior Assessment and Plan Problem List as of 02/26/2015      Cardiovascular and Mediastinum   NSTEMI (non-ST elevated myocardial infarction)   Last Assessment & Plan 07/04/2014 Office Visit Written 07/04/2014  1:35 PM by Lendon Colonel, NP    He is s/p PTCA of the of the Cx beyond the AV groove. He is without complaints, is medically compliant. He is already referred to cardiac rehab. I have explained his medications to him, and emphasized the importance of compliance. He verbalizes understanding. We will see him again in 3 months. Sooner  if he is symptomatic. He is to report and bleeding.       Hypertension   Last Assessment &  Plan 07/04/2014 Office Visit Written 07/04/2014  1:35 PM by Lendon Colonel, NP    He is well controlled currently. No changes in his medication regimen.      CAD (coronary artery disease)     Endocrine   Diabetes mellitus, type 2   Last Assessment & Plan 07/04/2014 Office Visit Written 07/04/2014  1:36 PM by Lendon Colonel, NP    He is to adhere to diabetic diet.        Nervous and Auditory   HOH (hard of hearing)     Genitourinary   Bladder cancer     Other   HLD (hyperlipidemia)       Imaging: Dg Chest 2 View  02/06/2015   CLINICAL DATA:  Coronary artery disease.  24 hours of left arm pain  EXAM: CHEST  2 VIEW  COMPARISON:  July 31, 2012  FINDINGS: Lungs are clear. Heart size and pulmonary vascularity are normal. No adenopathy. No pneumothorax. No bone lesions.  IMPRESSION: No edema or consolidation.   Electronically Signed   By: Lowella Grip III M.D.   On: 02/06/2015 07:09

## 2015-03-24 DIAGNOSIS — E782 Mixed hyperlipidemia: Secondary | ICD-10-CM | POA: Diagnosis not present

## 2015-03-24 DIAGNOSIS — R1084 Generalized abdominal pain: Secondary | ICD-10-CM | POA: Diagnosis not present

## 2015-03-24 DIAGNOSIS — E119 Type 2 diabetes mellitus without complications: Secondary | ICD-10-CM | POA: Diagnosis not present

## 2015-03-24 DIAGNOSIS — R1012 Left upper quadrant pain: Secondary | ICD-10-CM | POA: Diagnosis not present

## 2015-03-24 DIAGNOSIS — I10 Essential (primary) hypertension: Secondary | ICD-10-CM | POA: Diagnosis not present

## 2015-03-30 ENCOUNTER — Encounter: Payer: Self-pay | Admitting: Adult Health

## 2015-03-30 ENCOUNTER — Ambulatory Visit (INDEPENDENT_AMBULATORY_CARE_PROVIDER_SITE_OTHER): Payer: Medicare Other | Admitting: Adult Health

## 2015-03-30 VITALS — BP 136/84 | HR 86 | Ht 68.0 in | Wt 200.0 lb

## 2015-03-30 DIAGNOSIS — I10 Essential (primary) hypertension: Secondary | ICD-10-CM | POA: Diagnosis not present

## 2015-03-30 DIAGNOSIS — I2510 Atherosclerotic heart disease of native coronary artery without angina pectoris: Secondary | ICD-10-CM | POA: Diagnosis not present

## 2015-03-30 NOTE — Progress Notes (Signed)
Cardiology Office Note   Date:  03/30/2015   ID:  Timothy [name], DOB 29-Dec-1945, MRN [id number]  PCP:  Glo Herring., MD  Cardiologist:  Cloria Spring, NP   Chief Complaint  Patient presents with  . Coronary Artery Disease    PCI of CX and RCA  . Hypertension      History of Present Illness: Timothy [name] is a 69 y.o. male who presents for ongoing assessment and management of CAD,with most recent hospitalization in March of 2016, with non-ST elevation MI.  She has a PCI to the circumflex, OM, and RCA.  He was last seen in the office in March of 2016. Here for one-month followup after medication changes to include changing Crestor to a Medicaid formulary atorvastatin 80 mg daily.  He was continued on beta blocker and ARB.  He denies any recurrent chest pain on this visit.  He only complains of some right ankle pain, which occurs when he is walking.  He thinks is related to the atorvastatin.  He is walking daily on a treadmill or outside, has not been convinced to go to cardiac rehabilitation.  He is medically compliant.  He is followed by primary care for recurrent labs especially diabetes related.    Past Medical History  Diagnosis Date  . Diabetes mellitus, type 2   . Hypertension   . Bladder cancer   . Elevated PSA   . CAD (coronary artery disease)     a. 06/2014: NSTEMI s/p PTCA of LCx, 60-80% residual in distal LAD, 70% prox small D1.   b. 02/06/15 NSTEMI  s/p successful PCI/DES to mid RCA  . HOH (hard of hearing)   . HLD (hyperlipidemia)     Past Surgical History  Procedure Laterality Date  . Transurethral resection of bladder tumor N/A 03/14/2013    Procedure: TRANSURETHRAL RESECTION OF BLADDER TUMOR (TURBT);  Surgeon: [name], MD;  Location: [facility];  Service: Urology;  Laterality: N/A;  1 HR WITH MITOMYCIN INSTILLATION   . Transurethral resection of bladder tumor N/A 09/30/2013    Procedure: TRANSURETHRAL RESECTION OF  BLADDER TUMOR (TURBT);  Surgeon: [name], MD;  Location: [facility];  Service: Urology;  Laterality: N/A;  . Transurethral resection of bladder tumor N/A 03/31/2014    Procedure: TRANSURETHRAL RESECTION OF BLADDER TUMOR (TURBT);  Surgeon: [name], MD;  Location: [facility];  Service: Urology;  Laterality: N/A;  . Prostate biopsy N/A 03/31/2014    Procedure: BIOPSY TRANSRECTAL ULTRASONIC PROSTATE (TUBP);  Surgeon: [name], MD;  Location: [facility];  Service: Urology;  Laterality: N/A;  . Left heart catheterization with coronary angiogram N/A 06/17/2014    Procedure: LEFT HEART CATHETERIZATION WITH CORONARY ANGIOGRAM;  Surgeon: [name], MD;  Location: [facility] CATH LAB;  Service: Cardiovascular;  Laterality: N/A;  . Left heart catheterization with coronary angiogram N/A 02/06/2015    Procedure: LEFT HEART CATHETERIZATION WITH CORONARY ANGIOGRAM;  Surgeon: [name], MD;  Location: [facility] CATH LAB;  Service: Cardiovascular;  Laterality: N/A;  . Cardiac catheterization  02/06/2015    Procedure: CORONARY STENT INTERVENTION;  Surgeon: [name], MD;  Location: [facility] CATH LAB;  Service: Cardiovascular;;  Mid RCA     Current Outpatient Prescriptions  Medication Sig Dispense Refill  . aspirin 81 MG chewable tablet Chew 1 tablet (81 mg total) by mouth daily.    Marland Kitchen atorvastatin (LIPITOR) 80 MG tablet Take 1 tablet (80 mg total) by mouth daily.  30 tablet 6  . clopidogrel (PLAVIX) 75 MG tablet Take 1 tablet (75 mg total) by mouth daily with breakfast. 30 tablet 12  . glimepiride (AMARYL) 2 MG tablet Take 2 mg by mouth daily.     Marland Kitchen losartan (COZAAR) 50 MG tablet Take 1 tablet (50 mg total) by mouth daily. 30 tablet 12  . metFORMIN (GLUCOPHAGE) 500 MG tablet Take 1 tablet (500 mg total) by mouth 2 (two) times daily with a meal. 60 tablet 11  . metoprolol succinate (TOPROL-XL) 25 MG 24 hr tablet Take 1 tablet (25 mg total) by mouth  daily. 30 tablet 12  . nitroGLYCERIN (NITROSTAT) 0.4 MG SL tablet Place 1 tablet (0.4 mg total) under the tongue every 5 (five) minutes x 3 doses as needed for chest pain. 25 tablet 12   No current facility-administered medications for this visit.   Facility-Administered Medications Ordered in Other Visits  Medication Dose Route Frequency Provider Last Rate Last Dose  . mitoMYcin (MUTAMYCIN) chemo injection 40 mg  40 mg Bladder Instillation Once [name], MD        Allergies:   Lipitor and Pravastatin    Social History:  The patient  reports that he quit smoking about 22 years ago. His smoking use included Cigarettes. He has a 30 pack-year smoking history. He has never used smokeless tobacco. He reports that he does not drink alcohol or use illicit drugs.   Family History:  The patient's family history is not on file.    ROS: .   All other systems are reviewed and negative.Unless otherwise mentioned in  H&P above.   PHYSICAL EXAM: VS:  There were no vitals taken for this visit. , BMI There is no weight on file to calculate BMI. GEN: Well nourished, well developed, in no acute distress HEENT: normal Neck: no JVD, carotid bruits, or masses Cardiac: RRR; no murmurs, rubs, or gallops,no edema  Respiratory:  clear to auscultation bilaterally, normal work of breathing GI: soft, nontender, nondistended, + BS MS: no deformity or atrophybilateral pulses are palpable in the lower extremities.  No pain with dorsiflexion of the right foot Skin: warm and dry, no rash Neuro:  Strength and sensation are intact Psych: euthymic mood, full affect  Recent Labs: 02/06/2015: ALT 37; Magnesium 2.2; TSH 1.805 02/07/2015: BUN 14; Creatinine 1.25; Hemoglobin 13.4; Platelets 228; Potassium 3.4*; Sodium 137    Lipid Panel    Component Value Date/Time   CHOL 222* 10/13/2014 0957   TRIG 201* 10/13/2014 0957   HDL 33* 10/13/2014 0957   CHOLHDL 6.7 10/13/2014 0957   VLDL 40 10/13/2014 0957    LDLCALC 149* 10/13/2014 0957      Wt Readings from Last 3 Encounters:  02/26/15 198 lb (89.812 kg)  02/07/15 243 lb 2.7 oz (110.3 kg)  10/13/14 205 lb (92.987 kg)      Other studies Reviewed: Additional studies/ records that were reviewed today include: None Review of the above records demonstrates: N/A   ASSESSMENT AND PLAN:  1. CAD: Status post non-ST elevation MI in March of 2015, status post PTCA of the circumflex and OM.  He remains on dual antiplatelet therapy, along with beta blocker, and statin.  He thinks the statin is causing right ankle pain.  I doubt this is a manifestation of myalgias associated with statin use.  I have advised him to stop taking the atorvastatin for about a week to see if the pain gets better.  If it does, then he will need  to call us so that we can adjust his statin dose.  Otherwise, he will continue on his current medication regimen, and see [place] in 6 months.  2. Hypertension: BP is well controlled.  We will continue his current medication regimen.   Current medicines are reviewed at length with the patient today.    Labs/ tests ordered today include: None No orders of the defined types were placed in this encounter.     Disposition:   FU with 6 months Signed, Jory Sims, NP  03/30/2015 7:10 AM    [facility] [address] [place], [place] [address] Phone: [phone]; Fax: [phone]

## 2015-03-30 NOTE — Progress Notes (Deleted)
Name: Timothy [name]    DOB: 19-Apr-1946  Age: 69 y.o.  MR#: [id number]       PCP:  Glo Herring., MD      Insurance: Payor: [employer] MEDICARE / Plan: [facility] MEDICARE / Product Type: *No Product type* /   CC:    Chief Complaint  Patient presents with  . Coronary Artery Disease    PCI of CX and RCA  . Hypertension    VS Filed Vitals:   03/30/15 1329  BP: 136/84  Pulse: 86  Height: 5\' 8"  (1.727 m)  Weight: 200 lb (90.719 kg)  SpO2: 97%    Weights Current Weight  03/30/15 200 lb (90.719 kg)  02/26/15 198 lb (89.812 kg)  02/07/15 243 lb 2.7 oz (110.3 kg)    Blood Pressure  BP Readings from Last 3 Encounters:  03/30/15 136/84  02/26/15 126/78  02/07/15 125/73     Admit date:  (Not on file) Last encounter with RMR:  02/26/2015   Allergy Lipitor and Pravastatin  Current Outpatient Prescriptions  Medication Sig Dispense Refill  . aspirin 81 MG chewable tablet Chew 1 tablet (81 mg total) by mouth daily.    Marland Kitchen atorvastatin (LIPITOR) 80 MG tablet Take 1 tablet (80 mg total) by mouth daily. 30 tablet 6  . clopidogrel (PLAVIX) 75 MG tablet Take 1 tablet (75 mg total) by mouth daily with breakfast. 30 tablet 12  . glimepiride (AMARYL) 2 MG tablet Take 2 mg by mouth daily.     Marland Kitchen losartan (COZAAR) 50 MG tablet Take 1 tablet (50 mg total) by mouth daily. 30 tablet 12  . metFORMIN (GLUCOPHAGE) 500 MG tablet Take 1 tablet (500 mg total) by mouth 2 (two) times daily with a meal. 60 tablet 11  . metoprolol succinate (TOPROL-XL) 25 MG 24 hr tablet Take 1 tablet (25 mg total) by mouth daily. 30 tablet 12  . nitroGLYCERIN (NITROSTAT) 0.4 MG SL tablet Place 1 tablet (0.4 mg total) under the tongue every 5 (five) minutes x 3 doses as needed for chest pain. 25 tablet 12   No current facility-administered medications for this visit.   Facility-Administered Medications Ordered in Other Visits  Medication Dose Route Frequency Provider Last Rate Last Dose  . mitoMYcin (MUTAMYCIN) chemo  injection 40 mg  40 mg Bladder Instillation Once [name], MD        Discontinued Meds:   There are no discontinued medications.  Patient Active Problem List   Diagnosis Date Noted  . HOH (hard of hearing)   . HLD (hyperlipidemia)   . CAD (coronary artery disease)   . Hypertension   . Diabetes mellitus, type 2   . Bladder cancer   . NSTEMI (non-ST elevated myocardial infarction) 06/17/2014    LABS    Component Value Date/Time   NA 137 02/07/2015 0038   NA 138 02/06/2015 1338   NA 139 02/06/2015 0633   K 3.4* 02/07/2015 0038   K 3.8 02/06/2015 1338   K 3.9 02/06/2015 0633   CL 104 02/07/2015 0038   CL 107 02/06/2015 1338   CL 105 02/06/2015 0633   CO2 24 02/07/2015 0038   CO2 21 02/06/2015 1338   CO2 26 06/18/2014 1245   GLUCOSE 140* 02/07/2015 0038   GLUCOSE 97 02/06/2015 1338   GLUCOSE 149* 02/06/2015 0633   BUN 14 02/07/2015 0038   BUN 15 02/06/2015 1338   BUN 22 02/06/2015 0633   CREATININE 1.25 02/07/2015 0038   CREATININE 1.14 02/06/2015 1338  CREATININE 1.10 02/06/2015 0633   CALCIUM 8.2* 02/07/2015 0038   CALCIUM 8.3* 02/06/2015 1338   CALCIUM 9.1 06/18/2014 1245   GFRNONAA 58* 02/07/2015 0038   GFRNONAA 65* 02/06/2015 1338   GFRNONAA 62* 06/18/2014 1245   GFRAA 67* 02/07/2015 0038   GFRAA 75* 02/06/2015 1338   GFRAA 72* 06/18/2014 1245   CMP     Component Value Date/Time   NA 137 02/07/2015 0038   K 3.4* 02/07/2015 0038   CL 104 02/07/2015 0038   CO2 24 02/07/2015 0038   GLUCOSE 140* 02/07/2015 0038   BUN 14 02/07/2015 0038   CREATININE 1.25 02/07/2015 0038   CALCIUM 8.2* 02/07/2015 0038   PROT 6.5 02/06/2015 1338   ALBUMIN 3.5 02/06/2015 1338   AST 44* 02/06/2015 1338   ALT 37 02/06/2015 1338   ALKPHOS 102 02/06/2015 1338   BILITOT 0.9 02/06/2015 1338   GFRNONAA 58* 02/07/2015 0038   GFRAA 67* 02/07/2015 0038       Component Value Date/Time   WBC 6.8 02/07/2015 0038   WBC 7.4 02/06/2015 0620   WBC 12.7* 06/19/2014 0330    HGB 13.4 02/07/2015 0038   HGB 15.6 02/06/2015 0633   HGB 14.5 02/06/2015 0620   HCT 39.2 02/07/2015 0038   HCT 46.0 02/06/2015 0633   HCT 42.1 02/06/2015 0620   MCV 88.5 02/07/2015 0038   MCV 90.0 02/06/2015 0620   MCV 92.5 06/19/2014 0330    Lipid Panel     Component Value Date/Time   CHOL 222* 10/13/2014 0957   TRIG 201* 10/13/2014 0957   HDL 33* 10/13/2014 0957   CHOLHDL 6.7 10/13/2014 0957   VLDL 40 10/13/2014 0957   LDLCALC 149* 10/13/2014 0957    ABG    Component Value Date/Time   TCO2 20 02/06/2015 0633     Lab Results  Component Value Date   TSH 1.805 02/06/2015   BNP (last 3 results) No results for input(s): BNP in the last 8760 hours.  ProBNP (last 3 results) No results for input(s): PROBNP in the last 8760 hours.  Cardiac Panel (last 3 results) No results for input(s): CKTOTAL, CKMB, TROPONINI, RELINDX in the last 72 hours.  Iron/TIBC/Ferritin/ %Sat No results found for: IRON, TIBC, FERRITIN, IRONPCTSAT   EKG Orders placed or performed during the hospital encounter of 02/06/15  . EKG 12-Lead  . EKG 12-Lead  . EKG 12-Lead immediately post procedure  . EKG 12-Lead  . EKG 12-Lead  . EKG 12-Lead  . EKG 12-Lead immediately post procedure  . EKG 12-Lead  . EKG 12-Lead  . EKG 12-Lead  . EKG  . EKG     Prior Assessment and Plan Problem List as of 03/30/2015      Cardiovascular and Mediastinum   NSTEMI (non-ST elevated myocardial infarction)   Last Assessment & Plan 07/04/2014 Office Visit Written 07/04/2014  1:35 PM by Lendon Colonel, NP    He is s/p PTCA of the of the Cx beyond the AV groove. He is without complaints, is medically compliant. He is already referred to cardiac rehab. I have explained his medications to him, and emphasized the importance of compliance. He verbalizes understanding. We will see him again in 3 months. Sooner if he is symptomatic. He is to report and bleeding.       Hypertension   Last Assessment & Plan 07/04/2014  Office Visit Written 07/04/2014  1:35 PM by Lendon Colonel, NP    He is well controlled currently. No changes  in his medication regimen.      CAD (coronary artery disease)     Endocrine   Diabetes mellitus, type 2   Last Assessment & Plan 07/04/2014 Office Visit Written 07/04/2014  1:36 PM by Lendon Colonel, NP    He is to adhere to diabetic diet.        Nervous and Auditory   HOH (hard of hearing)     Genitourinary   Bladder cancer     Other   HLD (hyperlipidemia)       Imaging: No results found.

## 2015-03-30 NOTE — Patient Instructions (Signed)
Your physician wants you to follow-up in: 6 months with Kathryn Lawrence, NP. You will receive a reminder letter in the mail two months in advance. If you don't receive a letter, please call our office to schedule the follow-up appointment.  Your physician recommends that you continue on your current medications as directed. Please refer to the Current Medication list given to you today.  Thank you for choosing [facility]!   

## 2015-04-07 ENCOUNTER — Ambulatory Visit (INDEPENDENT_AMBULATORY_CARE_PROVIDER_SITE_OTHER): Payer: Medicare Other | Admitting: Urology

## 2015-04-07 DIAGNOSIS — C674 Malignant neoplasm of posterior wall of bladder: Secondary | ICD-10-CM | POA: Diagnosis not present

## 2015-04-07 DIAGNOSIS — N400 Enlarged prostate without lower urinary tract symptoms: Secondary | ICD-10-CM | POA: Diagnosis not present

## 2015-04-07 DIAGNOSIS — C61 Malignant neoplasm of prostate: Secondary | ICD-10-CM

## 2015-04-08 ENCOUNTER — Encounter: Payer: Self-pay | Admitting: Internal Medicine

## 2015-04-08 DIAGNOSIS — H903 Sensorineural hearing loss, bilateral: Secondary | ICD-10-CM | POA: Diagnosis not present

## 2015-04-20 ENCOUNTER — Other Ambulatory Visit: Payer: Self-pay | Admitting: Urology

## 2015-04-24 ENCOUNTER — Ambulatory Visit: Payer: Medicare Other | Admitting: Gastroenterology

## 2015-04-30 ENCOUNTER — Encounter (HOSPITAL_COMMUNITY): Payer: Self-pay

## 2015-04-30 ENCOUNTER — Encounter (HOSPITAL_COMMUNITY)
Admission: RE | Admit: 2015-04-30 | Discharge: 2015-04-30 | Disposition: A | Discharge status: Home / Self Care | Payer: Medicare Other | Source: Ambulatory Visit | Attending: Urology | Admitting: Urology

## 2015-04-30 DIAGNOSIS — Z01818 Encounter for other preprocedural examination: Secondary | ICD-10-CM | POA: Diagnosis not present

## 2015-04-30 DIAGNOSIS — C679 Malignant neoplasm of bladder, unspecified: Secondary | ICD-10-CM | POA: Diagnosis not present

## 2015-04-30 LAB — BASIC METABOLIC PANEL
Anion gap: 9 (ref 5–15)
BUN: 18 mg/dL (ref 6–20)
CALCIUM: 9.1 mg/dL (ref 8.9–10.3)
CHLORIDE: 105 mmol/L (ref 101–111)
CO2: 24 mmol/L (ref 22–32)
Creatinine, Ser: 1.2 mg/dL (ref 0.61–1.24)
GFR calc Af Amer: >60 mL/min (ref >60)
Glucose, Bld: 127 mg/dL — ABNORMAL HIGH (ref 65–99)
Potassium: 4.1 mmol/L (ref 3.5–5.1)
Sodium: 138 mmol/L (ref 135–145)

## 2015-04-30 LAB — CBC
HCT: 44.7 % (ref 39.0–52.0)
Hemoglobin: 15.2 g/dL (ref 13.0–17.0)
MCH: 30.6 pg (ref 26.0–34.0)
MCHC: 34 g/dL (ref 30.0–36.0)
MCV: 89.9 fL (ref 78.0–100.0)
PLATELETS: 193 10*3/uL (ref 150–400)
RBC: 4.97 MIL/uL (ref 4.22–5.81)
RDW: 14.6 % (ref 11.5–15.5)
WBC: 6.5 10*3/uL (ref 4.0–10.5)

## 2015-04-30 NOTE — Patient Instructions (Signed)
Your procedure is scheduled on: 05/05/2015  Report to [facility] at   11;00  AM.  Call this number if you have problems the morning of surgery: [phone]   Remember:   Do not drink or eat food:After Midnight.  :  Take these medicines the morning of surgery with A SIP OF WATER: lOSARTIN AND METOPROLOL   Do not wear jewelry, make-up or nail polish.  Do not wear lotions, powders, or perfumes. You may wear deodorant.  Do not shave 48 hours prior to surgery. Men may shave face and neck.  Do not bring valuables to the hospital.  Contacts, dentures or bridgework may not be worn into surgery.  Leave suitcase in the car. After surgery it may be brought to your room.  For patients admitted to the hospital, checkout time is 11:00 AM the day of discharge.   Patients discharged the day of surgery will not be allowed to drive home.    Special Instructions: Shower using CHG night before surgery and shower the day of surgery use CHG.  Use special wash - you have one bottle of CHG for all showers.  You should use approximately 1/2 of the bottle for each shower.   Please read over the following fact sheets that you were given: Pain Booklet, MRSA Information, Surgical Site Infection Prevention and Care and Recovery After Surgery  Cystoscopy, Care After Refer to this sheet in the next few weeks. These instructions provide you with information on caring for yourself after your procedure. Your caregiver may also give you more specific instructions. Your treatment has been planned according to current medical practices, but problems sometimes occur. Call your caregiver if you have any problems or questions after your procedure. HOME CARE INSTRUCTIONS  Things you can do to ease any discomfort after your procedure include:  Drinking enough water and fluids to keep your urine clear or pale yellow.  Taking a warm bath to relieve any burning feelings. SEEK IMMEDIATE MEDICAL CARE IF:   You have an increase  in blood in your urine.  You notice blood clots in your urine.  You have difficulty passing urine.  You have the chills.  You have abdominal pain.  You have a fever or persistent symptoms for more than 2-3 days.  You have a fever and your symptoms suddenly get worse. MAKE SURE YOU:   Understand these instructions.  Will watch your condition.  Will get help right away if you are not doing well or get worse. Document Released: 06/10/2005 Document Revised: 07/24/2013 Document Reviewed: 05/14/2012 [facility] Patient Information 2015 [place], [place]. This information is not intended to replace advice given to you by your health care provider. Make sure you discuss any questions you have with your health care provider. General Anesthesia, Care After Refer to this sheet in the next few weeks. These instructions provide you with information on caring for yourself after your procedure. Your health care provider may also give you more specific instructions. Your treatment has been planned according to current medical practices, but problems sometimes occur. Call your health care provider if you have any problems or questions after your procedure. WHAT TO EXPECT AFTER THE PROCEDURE After the procedure, it is typical to experience:  Sleepiness.  Nausea and vomiting. HOME CARE INSTRUCTIONS  For the first 24 hours after general anesthesia:  Have a responsible person with you.  Do not drive a car. If you are alone, do not take public transportation.  Do not drink alcohol.  Do not take medicine that has not been prescribed by your health care provider.  Do not sign important papers or make important decisions.  You may resume a normal diet and activities as directed by your health care provider.  Change bandages (dressings) as directed.  If you have questions or problems that seem related to general anesthesia, call the hospital and ask for the anesthetist or anesthesiologist on  call. SEEK MEDICAL CARE IF:  You have nausea and vomiting that continue the day after anesthesia.  You develop a rash. SEEK IMMEDIATE MEDICAL CARE IF:   You have difficulty breathing.  You have chest pain.  You have any allergic problems. Document Released: 02/27/2001 Document Revised: 11/26/2013 Document Reviewed: 06/06/2013 [facility] Patient Information 2015 [place], [place]. This information is not intended to replace advice given to you by your health care provider. Make sure you discuss any questions you have with your health care provider.

## 2015-05-05 ENCOUNTER — Encounter (HOSPITAL_COMMUNITY): Payer: Self-pay | Admitting: *Deleted

## 2015-05-05 ENCOUNTER — Ambulatory Visit (HOSPITAL_COMMUNITY): Payer: Medicare Other | Admitting: Anesthesiology

## 2015-05-05 ENCOUNTER — Ambulatory Visit (HOSPITAL_COMMUNITY)
Admission: RE | Admit: 2015-05-05 | Discharge: 2015-05-05 | Disposition: A | Discharge status: Home / Self Care | Payer: Medicare Other | Source: Ambulatory Visit | Attending: Urology | Admitting: Urology

## 2015-05-05 ENCOUNTER — Encounter (HOSPITAL_COMMUNITY)
Admission: RE | Disposition: A | Discharge status: Home / Self Care | Payer: Self-pay | Source: Ambulatory Visit | Attending: Urology

## 2015-05-05 ENCOUNTER — Ambulatory Visit (HOSPITAL_COMMUNITY): Payer: Medicare Other

## 2015-05-05 DIAGNOSIS — I2510 Atherosclerotic heart disease of native coronary artery without angina pectoris: Secondary | ICD-10-CM | POA: Diagnosis not present

## 2015-05-05 DIAGNOSIS — Z79899 Other long term (current) drug therapy: Secondary | ICD-10-CM | POA: Diagnosis not present

## 2015-05-05 DIAGNOSIS — N3020 Other chronic cystitis without hematuria: Secondary | ICD-10-CM | POA: Insufficient documentation

## 2015-05-05 DIAGNOSIS — Z8546 Personal history of malignant neoplasm of prostate: Secondary | ICD-10-CM | POA: Diagnosis not present

## 2015-05-05 DIAGNOSIS — Z7982 Long term (current) use of aspirin: Secondary | ICD-10-CM | POA: Diagnosis not present

## 2015-05-05 DIAGNOSIS — E785 Hyperlipidemia, unspecified: Secondary | ICD-10-CM | POA: Insufficient documentation

## 2015-05-05 DIAGNOSIS — H9193 Unspecified hearing loss, bilateral: Secondary | ICD-10-CM | POA: Diagnosis not present

## 2015-05-05 DIAGNOSIS — C679 Malignant neoplasm of bladder, unspecified: Secondary | ICD-10-CM | POA: Diagnosis not present

## 2015-05-05 DIAGNOSIS — Z9861 Coronary angioplasty status: Secondary | ICD-10-CM | POA: Diagnosis not present

## 2015-05-05 DIAGNOSIS — Z888 Allergy status to other drugs, medicaments and biological substances status: Secondary | ICD-10-CM | POA: Diagnosis not present

## 2015-05-05 DIAGNOSIS — I252 Old myocardial infarction: Secondary | ICD-10-CM | POA: Diagnosis not present

## 2015-05-05 DIAGNOSIS — E119 Type 2 diabetes mellitus without complications: Secondary | ICD-10-CM | POA: Insufficient documentation

## 2015-05-05 DIAGNOSIS — Z87891 Personal history of nicotine dependence: Secondary | ICD-10-CM | POA: Insufficient documentation

## 2015-05-05 DIAGNOSIS — N3289 Other specified disorders of bladder: Secondary | ICD-10-CM | POA: Diagnosis not present

## 2015-05-05 DIAGNOSIS — Z8551 Personal history of malignant neoplasm of bladder: Secondary | ICD-10-CM | POA: Diagnosis not present

## 2015-05-05 DIAGNOSIS — I10 Essential (primary) hypertension: Secondary | ICD-10-CM | POA: Insufficient documentation

## 2015-05-05 DIAGNOSIS — Z8550 Personal history of malignant neoplasm of unspecified urinary tract organ: Secondary | ICD-10-CM | POA: Diagnosis not present

## 2015-05-05 HISTORY — PX: CYSTOSCOPY W/ RETROGRADES: SHX1426

## 2015-05-05 LAB — GLUCOSE, CAPILLARY
GLUCOSE-CAPILLARY: 106 mg/dL — AB (ref 65–99)
GLUCOSE-CAPILLARY: 74 mg/dL (ref 65–99)

## 2015-05-05 SURGERY — CYSTOSCOPY, WITH RETROGRADE PYELOGRAM
Anesthesia: General

## 2015-05-05 MED ORDER — FENTANYL CITRATE (PF) 100 MCG/2ML IJ SOLN
INTRAMUSCULAR | Status: AC
Start: 2015-05-05 — End: 2015-05-05
  Filled 2015-05-05: qty 2

## 2015-05-05 MED ORDER — PROPOFOL 10 MG/ML IV BOLUS
INTRAVENOUS | Status: DC | PRN
  Administered 2015-05-05: 180 mg via INTRAVENOUS

## 2015-05-05 MED ORDER — IOHEXOL 300 MG/ML  SOLN
INTRAMUSCULAR | Status: DC | PRN
  Administered 2015-05-05: 10 mL

## 2015-05-05 MED ORDER — FENTANYL CITRATE (PF) 250 MCG/5ML IJ SOLN
INTRAMUSCULAR | Status: AC
  Filled 2015-05-05: qty 5

## 2015-05-05 MED ORDER — STERILE WATER FOR IRRIGATION IR SOLN
Status: DC | PRN
  Administered 2015-05-05 (×2): 3000 mL

## 2015-05-05 MED ORDER — MIDAZOLAM HCL 2 MG/2ML IJ SOLN
1.0000 mg | INTRAMUSCULAR | Status: DC | PRN
  Administered 2015-05-05: 2 mg via INTRAVENOUS

## 2015-05-05 MED ORDER — STERILE WATER FOR IRRIGATION IR SOLN
Status: DC | PRN
  Administered 2015-05-05: 1000 mL

## 2015-05-05 MED ORDER — ONDANSETRON HCL 4 MG/2ML IJ SOLN
INTRAMUSCULAR | Status: DC | PRN
  Administered 2015-05-05: 4 mg via INTRAVENOUS

## 2015-05-05 MED ORDER — SODIUM CHLORIDE 0.9 % IJ SOLN
INTRAMUSCULAR | Status: AC
  Filled 2015-05-05: qty 10

## 2015-05-05 MED ORDER — CEFAZOLIN SODIUM 1-5 GM-% IV SOLN
INTRAVENOUS | Status: DC | PRN
  Administered 2015-05-05: 1 g via INTRAVENOUS

## 2015-05-05 MED ORDER — FENTANYL CITRATE (PF) 100 MCG/2ML IJ SOLN
INTRAMUSCULAR | Status: DC | PRN
  Administered 2015-05-05: 25 ug via INTRAVENOUS
  Administered 2015-05-05: 50 ug via INTRAVENOUS
  Administered 2015-05-05: 25 ug via INTRAVENOUS

## 2015-05-05 MED ORDER — EPHEDRINE SULFATE 50 MG/ML IJ SOLN
INTRAMUSCULAR | Status: AC
  Filled 2015-05-05: qty 1

## 2015-05-05 MED ORDER — ONDANSETRON HCL 4 MG/2ML IJ SOLN: 4.0000 mg | Freq: Once | INTRAMUSCULAR | Status: DC | PRN

## 2015-05-05 MED ORDER — LIDOCAINE HCL (PF) 1 % IJ SOLN
INTRAMUSCULAR | Status: AC
  Filled 2015-05-05: qty 5

## 2015-05-05 MED ORDER — LIDOCAINE HCL 2 % EX GEL
CUTANEOUS | Status: AC
  Filled 2015-05-05: qty 10

## 2015-05-05 MED ORDER — MIDAZOLAM HCL 2 MG/2ML IJ SOLN
INTRAMUSCULAR | Status: AC
  Filled 2015-05-05: qty 2

## 2015-05-05 MED ORDER — PROPOFOL 10 MG/ML IV BOLUS
INTRAVENOUS | Status: AC
  Filled 2015-05-05: qty 20

## 2015-05-05 MED ORDER — CEFAZOLIN SODIUM-DEXTROSE 2-3 GM-% IV SOLR
INTRAVENOUS | Status: AC
  Filled 2015-05-05: qty 50

## 2015-05-05 MED ORDER — ONDANSETRON HCL 4 MG/2ML IJ SOLN
4.0000 mg | Freq: Once | INTRAMUSCULAR | Status: AC
  Administered 2015-05-05: 4 mg via INTRAVENOUS

## 2015-05-05 MED ORDER — ONDANSETRON HCL 4 MG/2ML IJ SOLN
INTRAMUSCULAR | Status: AC
  Filled 2015-05-05: qty 2

## 2015-05-05 MED ORDER — CEFAZOLIN SODIUM 1-5 GM-% IV SOLN
INTRAVENOUS | Status: AC
  Filled 2015-05-05: qty 50

## 2015-05-05 MED ORDER — CEFAZOLIN SODIUM 1-5 GM-% IV SOLN: 1.0000 g | INTRAVENOUS | Status: DC

## 2015-05-05 MED ORDER — SUCCINYLCHOLINE CHLORIDE 20 MG/ML IJ SOLN
INTRAMUSCULAR | Status: AC
  Filled 2015-05-05: qty 1

## 2015-05-05 MED ORDER — CEPHALEXIN 500 MG PO CAPS: 500.0000 mg | ORAL_CAPSULE | Freq: Two times a day (BID) | ORAL | Status: DC

## 2015-05-05 MED ORDER — FENTANYL CITRATE (PF) 100 MCG/2ML IJ SOLN
25.0000 ug | INTRAMUSCULAR | Status: AC
  Administered 2015-05-05 (×2): 25 ug via INTRAVENOUS

## 2015-05-05 MED ORDER — FENTANYL CITRATE (PF) 100 MCG/2ML IJ SOLN
INTRAMUSCULAR | Status: AC
  Filled 2015-05-05: qty 2

## 2015-05-05 MED ORDER — LACTATED RINGERS IV SOLN
INTRAVENOUS | Status: DC
  Administered 2015-05-05 (×2): via INTRAVENOUS

## 2015-05-05 MED ORDER — LIDOCAINE HCL (CARDIAC) 10 MG/ML IV SOLN
INTRAVENOUS | Status: DC | PRN
  Administered 2015-05-05: 50 mg via INTRAVENOUS

## 2015-05-05 MED ORDER — FENTANYL CITRATE (PF) 100 MCG/2ML IJ SOLN
25.0000 ug | INTRAMUSCULAR | Status: DC | PRN
  Administered 2015-05-05 (×2): 25 ug via INTRAVENOUS
  Administered 2015-05-05: 50 ug via INTRAVENOUS

## 2015-05-05 MED ORDER — HYDROCODONE-ACETAMINOPHEN 5-325 MG PO TABS: 1.0000 | ORAL_TABLET | ORAL | Status: DC | PRN

## 2015-05-05 SURGICAL SUPPLY — 28 items
BAG DRAIN URO TABLE W/ADPT NS (DRAPE) ×2 IMPLANT
BAG DRN 8 ADPR NS SKTRN CSTL (DRAPE) ×1
BAG HAMPER (MISCELLANEOUS) ×2 IMPLANT
BAG URINE LEG 500ML (DRAIN) ×2 IMPLANT
CATH FOLEY 2WAY SLVR  5CC 18FR (CATHETERS) ×1
CATH FOLEY 2WAY SLVR 5CC 18FR (CATHETERS) IMPLANT
CATH INTERMIT  6FR 70CM (CATHETERS) ×2 IMPLANT
CLOTH BEACON ORANGE TIMEOUT ST (SAFETY) ×2 IMPLANT
FORMALIN 10 PREFIL 120ML (MISCELLANEOUS) ×1 IMPLANT
GLOVE BIOGEL M 8.0 STRL (GLOVE) ×2 IMPLANT
GLOVE BIOGEL PI IND STRL 6.5 (GLOVE) IMPLANT
GLOVE BIOGEL PI IND STRL 7.0 (GLOVE) IMPLANT
GLOVE BIOGEL PI INDICATOR 6.5 (GLOVE) ×1
GLOVE BIOGEL PI INDICATOR 7.0 (GLOVE) ×1
GLOVE ECLIPSE 6.5 STRL STRAW (GLOVE) ×1 IMPLANT
GOWN STRL REUS W/TWL LRG LVL3 (GOWN DISPOSABLE) ×2 IMPLANT
GOWN STRL REUS W/TWL XL LVL3 (GOWN DISPOSABLE) ×2 IMPLANT
GUIDEWIRE STR DUAL SENSOR (WIRE) ×2 IMPLANT
KIT ROOM TURNOVER AP CYSTO (KITS) ×2 IMPLANT
MANIFOLD NEPTUNE II (INSTRUMENTS) ×2 IMPLANT
NDL HYPO 18GX1.5 BLUNT FILL (NEEDLE) ×1 IMPLANT
NEEDLE HYPO 18GX1.5 BLUNT FILL (NEEDLE) ×2 IMPLANT
NS IRRIG 1000ML POUR BTL (IV SOLUTION) ×2 IMPLANT
PACK CYSTO (CUSTOM PROCEDURE TRAY) ×2 IMPLANT
PAD ARMBOARD 7.5X6 YLW CONV (MISCELLANEOUS) ×2 IMPLANT
PAD TELFA 3X4 1S STER (GAUZE/BANDAGES/DRESSINGS) ×2 IMPLANT
TOWEL OR 17X26 4PK STRL BLUE (TOWEL DISPOSABLE) ×2 IMPLANT
WATER STERILE IRR 3000ML UROMA (IV SOLUTION) ×3 IMPLANT

## 2015-05-05 NOTE — Transfer of Care (Signed)
Immediate Anesthesia Transfer of Care Note  Patient: Timothy [name]  Procedure(s) Performed: Procedure(s): CYSTOSCOPY WITH BILATERAL RETROGRADE PYELOGRAMS AND BLADDER BIOPSIES (N/A)  Patient Location: PACU  Anesthesia Type:General  Level of Consciousness: sedated and patient cooperative  Airway & Oxygen Therapy: Patient Spontanous Breathing and Patient connected to face mask oxygen  Post-op Assessment: Report given to RN, Post -op Vital signs reviewed and stable and Patient moving all extremities X 4  Post vital signs: Reviewed and stable  Last Vitals:  Filed Vitals:   05/05/15 1146  BP: 158/95  Temp: 36.7 C  Resp: 16    Complications: No apparent anesthesia complications

## 2015-05-05 NOTE — H&P (Signed)
Urology History and Physical Exam  CC: History of bladder cancer  HPI: 69 year old male Presents at this time for cystoscopy, bladder biopsies and retrogrades.  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  Recent cystoscopy revealed erythematous areas, cytologies were positive.  He also carries a history of prostate cancer which we have been following with active surveillance.  PMH: Past Medical History  Diagnosis Date  . Diabetes mellitus, type 2   . Hypertension   . Bladder cancer   . Elevated PSA   . CAD (coronary artery disease)     a. 06/2014: NSTEMI s/p PTCA of LCx, 60-80% residual in distal LAD, 70% prox small D1.   b. 02/06/15 NSTEMI  s/p successful PCI/DES to mid RCA  . HOH (hard of hearing)   . HLD (hyperlipidemia)     PSH: Past Surgical History  Procedure Laterality Date  . Transurethral resection of bladder tumor N/A 03/14/2013    Procedure: TRANSURETHRAL RESECTION OF BLADDER TUMOR (TURBT);  Surgeon: [name], MD;  Location[facility];  Service: Urology;  Laterality: N/A;  1 HR WITH MITOMYCIN INSTILLATION   . Transurethral resection of bladder tumor N/A 09/30/2013    Procedure: TRANSURETHRAL RESECTION OF BLADDER TUMOR (TURBT);  Surgeon: [name], MD;  Location: [facility];  Service: Urology;  Laterality: N/A;  . Transurethral resection of bladder tumor N/A 03/31/2014    Procedure: TRANSURETHRAL RESECTION OF BLADDER TUMOR (TURBT);  Surgeon: [name], MD;  Location: [facility];  Service: Urology;  Laterality: N/A;  . Prostate biopsy N/A 03/31/2014    Procedure: BIOPSY TRANSRECTAL ULTRASONIC PROSTATE (TUBP);  Surgeon: [name], MD;  Location: [facility];  Service: Urology;  Laterality: N/A;  . Left heart catheterization with coronary angiogram N/A 06/17/2014    Procedure: LEFT HEART CATHETERIZATION WITH CORONARY ANGIOGRAM;  Surgeon: [name], MD;  Location: [facility] CATH LAB;  Service: Cardiovascular;  Laterality: N/A;  . Left heart catheterization with coronary angiogram N/A 02/06/2015    Procedure: LEFT HEART CATHETERIZATION WITH CORONARY ANGIOGRAM;  Surgeon: [name], MD;  Location: [facility] CATH LAB;  Service: Cardiovascular;  Laterality: N/A;  . Cardiac catheterization  02/06/2015    Procedure: CORONARY STENT INTERVENTION;  Surgeon: [name], MD;  Location: [facility] CATH LAB;  Service: Cardiovascular;;  Mid RCA    Allergies: Allergies  Allergen Reactions  . Lipitor [Atorvastatin] Other (See Comments)    Myalgia  . Pravastatin Other (See Comments)    Myalgia    Medications: Prescriptions prior to admission  Medication Sig Dispense Refill Last Dose  . aspirin 81 MG chewable tablet Chew 1 tablet (81 mg total) by mouth daily.   05/05/2015 at 1000  . clopidogrel (PLAVIX) 75 MG tablet Take 1 tablet (75 mg total) by mouth daily with breakfast. 30 tablet 12 04/28/2015  . glimepiride (AMARYL) 2 MG tablet Take 2 mg by mouth daily.    Taking  . losartan (COZAAR) 50 MG tablet Take 1 tablet (50 mg total) by mouth daily. 30 tablet 12 05/05/2015 at 1000  . metFORMIN (GLUCOPHAGE) 500 MG tablet Take 1 tablet (500 mg total) by mouth 2 (two) times daily with a meal. 60 tablet 11 Taking  . metoprolol succinate (TOPROL-XL) 25 MG 24 hr tablet Take 1 tablet (25 mg total) by mouth daily. 30 tablet 12 05/05/2015 at 1000  . nitroGLYCERIN (NITROSTAT) 0.4 MG SL tablet Place 1 tablet (0.4 mg total) under  the tongue every 5 (five) minutes x 3 doses as needed for chest pain. 25 tablet 12 Taking  . atorvastatin (LIPITOR) 80 MG tablet Take 1 tablet (80 mg total) by mouth daily. (Patient not taking: Reported on 04/24/2015) 30 tablet 6 Not Taking at Unknown time     Social History: History   Social History  . Marital Status: Married    Spouse Name: N/A  . Number of Children: N/A  . Years of Education: N/A   Occupational History  . Not on file.    Social History Main Topics  . Smoking status: Former Smoker -- 1.00 packs/day for 30 years    Types: Cigarettes    Quit date: 03/11/1993  . Smokeless tobacco: Never Used  . Alcohol Use: No  . Drug Use: No  . Sexual Activity: Not on file   Other Topics Concern  . Not on file   Social History Narrative    Family History: History reviewed. No pertinent family history.  Review of Systems: Positive: Urinary frequency and urgency, occasional dysuria Negative:   A further 10 point review of systems was negative except what is listed in the HPI.                  Physical Exam: @VITALS2 @ General: No acute distress.  Awake. Head:  Normocephalic.  Atraumatic. ENT:  EOMI.  Mucous membranes moist Neck:  Supple.  No lymphadenopathy. CV:  S1 present. S2 present. Regular rate. Pulmonary: Equal effort bilaterally.  Clear to auscultation bilaterally. Abdomen: Soft.  Non- tender to palpation. Skin:  Normal turgor.  No visible rash. Extremity: No gross deformity of bilateral upper extremities.  No gross deformity of                             lower extremities. Neurologic: Alert. Appropriate mood.    Studies:  No results for input(s): HGB, WBC, PLT in the last 72 hours.  No results for input(s): NA, K, CL, CO2, BUN, CREATININE, CALCIUM, GFRNONAA, GFRAA in the last 72 hours.  Invalid input(s): MAGNESIUM   No results for input(s): INR, APTT in the last 72 hours.  Invalid input(s): PT   Invalid input(s): ABG    Assessment:       1.  NMIBC, recurrent, posterior bladder wall.  First TURBT was in April, 2014, the second in August, 2014, and a third in April 2015. S/P 2 induction courses of BCG .  He is now on maintenance BCG treatments, having completed her last one on 02/24/2015.  Cystoscopically there were no significant suspicious areas today  2.  Adenocarcinoma the prostate, stage T1cNxMx, Gleason 3+3 in less than 5% of 1/12 cores.  PSA density 0.17.  PSA at time of diagnosis  9.13.  He is on active surveillance protocol, his last PSA was 14 but his last biopsy in December, 2015 revealed only one area of granulomatous inflammation without evidence of adenocarcinoma.  Plan: Outpatient cystoscopy, bilateral retrogrades/renal washings, bladder biopsy

## 2015-05-05 NOTE — Anesthesia Postprocedure Evaluation (Signed)
  Anesthesia Post-op Note  Patient: Timothy [name]  Procedure(s) Performed: Procedure(s): CYSTOSCOPY WITH BILATERAL RETROGRADE PYELOGRAMS AND BLADDER BIOPSIES (N/A)  Patient Location: PACU  Anesthesia Type:General  Level of Consciousness: sedated, patient cooperative and responds to stimulation  Airway and Oxygen Therapy: Patient Spontanous Breathing and Patient connected to face mask oxygen  Post-op Pain: none  Post-op Assessment: Post-op Vital signs reviewed, Patient's Cardiovascular Status Stable, Respiratory Function Stable, Patent Airway, No signs of Nausea or vomiting and Pain level controlled  Post-op Vital Signs: Reviewed and stable  Last Vitals:  Filed Vitals:   05/05/15 1146  BP: 158/95  Temp: 36.7 C  Resp: 16    Complications: No apparent anesthesia complications

## 2015-05-05 NOTE — Anesthesia Preprocedure Evaluation (Addendum)
Anesthesia Evaluation  Patient identified by MRN, date of birth, ID band Patient awake    Reviewed: Allergy & Precautions, H&P , NPO status , Patient's Chart, lab work & pertinent test results  Airway Mallampati: II  TM Distance: >3 FB Neck ROM: Full    Dental no notable dental hx.    Pulmonary neg pulmonary ROS, former smoker,  breath sounds clear to auscultation  Pulmonary exam normal       Cardiovascular hypertension, Pt. on medications - angina+ CAD and + Past MI ( 06/2014: NSTEMI s/p PTCA of LCx,) Normal cardiovascular examRhythm:Regular Rate:Normal     Neuro/Psych negative neurological ROS  negative psych ROS   GI/Hepatic negative GI ROS, Neg liver ROS,   Endo/Other  diabetes, Well Controlled, Type 2, Oral Hypoglycemic Agents  Renal/GU   negative genitourinary   Musculoskeletal negative musculoskeletal ROS (+)   Abdominal   Peds negative pediatric ROS (+)  Hematology negative hematology ROS (+)   Anesthesia Other Findings   Reproductive/Obstetrics negative OB ROS                            Anesthesia Physical Anesthesia Plan  ASA: III  Anesthesia Plan: General   Post-op Pain Management:    Induction: Intravenous  Airway Management Planned: LMA  Additional Equipment:   Intra-op Plan:   Post-operative Plan: Extubation in OR  Informed Consent: I have reviewed the patients History and Physical, chart, labs and discussed the procedure including the risks, benefits and alternatives for the proposed anesthesia with the patient or authorized representative who has indicated his/her understanding and acceptance.   Dental advisory given  Plan Discussed with: CRNA and Surgeon  Anesthesia Plan Comments:         Anesthesia Quick Evaluation

## 2015-05-05 NOTE — Anesthesia Procedure Notes (Signed)
Procedure Name: Intubation Date/Time: 05/05/2015 1:43 PM Performed by: Gershon Mussel, Haizel Gatchell Pre-anesthesia Checklist: Patient identified, Patient being monitored, Timeout performed, Emergency Drugs available and Suction available Patient Re-evaluated:Patient Re-evaluated prior to inductionOxygen Delivery Method: Circle System Utilized Preoxygenation: Pre-oxygenation with 100% oxygen Intubation Type: IV induction Ventilation: Mask ventilation without difficulty LMA Size: 4.0 Grade View: Grade I Tube type: Oral Tube size: 7.0 mm Placement Confirmation: positive ETCO2 and breath sounds checked- equal and bilateral Secured at: 21 cm Tube secured with: Tape Dental Injury: Teeth and Oropharynx as per pre-operative assessment

## 2015-05-05 NOTE — Op Note (Signed)
PATIENT:  Timothy [name]  PRE-OPERATIVE DIAGNOSIS: History of bladder cancer  POST-OPERATIVE DIAGNOSIS: Same  PROCEDURE: Cystoscopy, bladder biopsies, bilateral retrograde pyelograms with interpretive fluoroscopy  SURGEON:  [name]. Kiam Bransfield, M.D.  ANESTHESIA:  General  EBL:  Minimal  DRAINS: 18 French Foley catheter  LOCAL MEDICATIONS USED:  None  SPECIMEN:  Bladder biopsies  INDICATION: Timothy [name] is a 69 year old male with positive cytologies following several resections for non-muscle invasive, high-grade bladder cancer. He has had BCG treatment. Because of positive cytologies, he presents at this time for repeat bladder biopsies and retrograde pyelograms.  Description of procedure: The patient was properly identified and marked (if applicable) in the holding area. They were then  taken to the operating room and placed on the table in a supine position. General anesthesia was then administered. Once fully anesthetized the patient was moved to the dorsolithotomy position and the genitalia and perineum were sterilely prepped and draped in standard fashion. An official timeout was then performed.  A 23 French panendoscope was passed under direct vision through his urethra. Urethra was normal. No strictures or lesions were noted. Prostatic urethra was normal and nonobstructive. The bladder was entered and inspected circumferentially. No specific tumors were noted. Several scars were noted from prior resections. The right ureteral orifice was somewhat golf-hole shaped, the left was normal. Positions were normal bilaterally. No foreign bodies were present. Circumferential inspection was performed, no lesions were seen on the dome or lateral walls. No anterior lesions were noted. There were several mildly erythematous areas that were biopsied using the cold cup biopsy forceps. Approximately 8 biopsies were taken, all labeled "bladder biopsies". They were placed in the same  container. At this point, bilateral retrograde ureteropyelograms were performed. Both these were performed using the same 6 [place] open-ended catheter.  Omnipaque was used to perform the right retrograde ureteropyelogram.. The right ureter and pyelocalyceal system was normal. No filling defects were seen within the pyelo-calyceal system. The ureter was free of strictures.  A similar procedure was done on the left. Similar findings were noted.  The Bugbee electrode was used to cauterize all biopsied sites. This was performed until hemostasis was noted. At this point, a Foley catheter was placed and hooked to dependent drainage, the balloon having been filled with 10 mL of water.  The patient was awakened and taken to the PACU in stable condition. He tolerated the procedure well.

## 2015-05-05 NOTE — Discharge Instructions (Signed)

## 2015-05-06 ENCOUNTER — Encounter (HOSPITAL_COMMUNITY): Payer: Self-pay | Admitting: Urology

## 2015-05-12 ENCOUNTER — Telehealth: Payer: Self-pay | Admitting: Adult Health

## 2015-05-12 NOTE — Telephone Encounter (Signed)
Patient states he can not take cholestrol medicine due to cramping / tg

## 2015-05-12 NOTE — Telephone Encounter (Signed)
Patient states that he stated back taking Lipitor 80 mg and is now having leg cramping. Patient states that he did not have cramping until he restarted Lipitor. Please advise.

## 2015-05-13 ENCOUNTER — Ambulatory Visit (INDEPENDENT_AMBULATORY_CARE_PROVIDER_SITE_OTHER): Payer: Medicare Other | Admitting: Ophthalmology

## 2015-05-13 NOTE — Telephone Encounter (Signed)
I asked him to stop statin to see if this was causing myalgias. He can stop lipitor. Begin Fish Oil (Mega RED) and if he can find it, Red Yeast Rice tablets daily.  Two tablets each, daily. Send him low cholesterol diet. Will talk to him about injections for hypercholesterolemia on next visit, but might be too expensive for him.

## 2015-05-13 NOTE — Telephone Encounter (Signed)
6/8 [id number]

## 2015-05-13 NOTE — Addendum Note (Signed)
Addended by: Barbarann Ehlers A on: 05/13/2015 02:00 PM   Modules accepted: Orders, Medications

## 2015-05-13 NOTE — Telephone Encounter (Signed)
Spoke with patient, he will start fish oil,try to find red yeast rice and hold lipitor

## 2015-05-14 DIAGNOSIS — H52223 Regular astigmatism, bilateral: Secondary | ICD-10-CM | POA: Diagnosis not present

## 2015-05-14 DIAGNOSIS — H524 Presbyopia: Secondary | ICD-10-CM | POA: Diagnosis not present

## 2015-05-14 DIAGNOSIS — H5203 Hypermetropia, bilateral: Secondary | ICD-10-CM | POA: Diagnosis not present

## 2015-05-14 DIAGNOSIS — H40019 Open angle with borderline findings, low risk, unspecified eye: Secondary | ICD-10-CM | POA: Diagnosis not present

## 2015-05-20 DIAGNOSIS — E119 Type 2 diabetes mellitus without complications: Secondary | ICD-10-CM | POA: Diagnosis not present

## 2015-05-26 ENCOUNTER — Ambulatory Visit (INDEPENDENT_AMBULATORY_CARE_PROVIDER_SITE_OTHER): Payer: Medicare Other | Admitting: Urology

## 2015-05-26 DIAGNOSIS — C61 Malignant neoplasm of prostate: Secondary | ICD-10-CM

## 2015-05-26 DIAGNOSIS — C674 Malignant neoplasm of posterior wall of bladder: Secondary | ICD-10-CM | POA: Diagnosis not present

## 2015-05-28 ENCOUNTER — Inpatient Hospital Stay (HOSPITAL_COMMUNITY)
Admission: AD | Admit: 2015-05-28 | Discharge: 2015-06-01 | DRG: 670 | Disposition: A | Discharge status: Home / Self Care | Payer: Medicare Other | Source: Ambulatory Visit | Attending: Urology | Admitting: Urology

## 2015-05-28 ENCOUNTER — Encounter (HOSPITAL_COMMUNITY): Payer: Self-pay

## 2015-05-28 DIAGNOSIS — R350 Frequency of micturition: Secondary | ICD-10-CM | POA: Diagnosis not present

## 2015-05-28 DIAGNOSIS — Z7902 Long term (current) use of antithrombotics/antiplatelets: Secondary | ICD-10-CM | POA: Diagnosis not present

## 2015-05-28 DIAGNOSIS — I10 Essential (primary) hypertension: Secondary | ICD-10-CM | POA: Diagnosis present

## 2015-05-28 DIAGNOSIS — I252 Old myocardial infarction: Secondary | ICD-10-CM | POA: Diagnosis not present

## 2015-05-28 DIAGNOSIS — E119 Type 2 diabetes mellitus without complications: Secondary | ICD-10-CM | POA: Diagnosis not present

## 2015-05-28 DIAGNOSIS — Z7982 Long term (current) use of aspirin: Secondary | ICD-10-CM

## 2015-05-28 DIAGNOSIS — N3289 Other specified disorders of bladder: Secondary | ICD-10-CM | POA: Diagnosis not present

## 2015-05-28 DIAGNOSIS — E785 Hyperlipidemia, unspecified: Secondary | ICD-10-CM | POA: Diagnosis present

## 2015-05-28 DIAGNOSIS — R310 Gross hematuria: Secondary | ICD-10-CM

## 2015-05-28 DIAGNOSIS — Z8551 Personal history of malignant neoplasm of bladder: Secondary | ICD-10-CM

## 2015-05-28 DIAGNOSIS — Z79899 Other long term (current) drug therapy: Secondary | ICD-10-CM

## 2015-05-28 DIAGNOSIS — Z888 Allergy status to other drugs, medicaments and biological substances status: Secondary | ICD-10-CM

## 2015-05-28 DIAGNOSIS — Z87891 Personal history of nicotine dependence: Secondary | ICD-10-CM | POA: Diagnosis not present

## 2015-05-28 DIAGNOSIS — I2510 Atherosclerotic heart disease of native coronary artery without angina pectoris: Secondary | ICD-10-CM | POA: Diagnosis not present

## 2015-05-28 DIAGNOSIS — R309 Painful micturition, unspecified: Secondary | ICD-10-CM | POA: Diagnosis not present

## 2015-05-28 DIAGNOSIS — R339 Retention of urine, unspecified: Secondary | ICD-10-CM | POA: Diagnosis present

## 2015-05-28 DIAGNOSIS — Z8546 Personal history of malignant neoplasm of prostate: Secondary | ICD-10-CM

## 2015-05-28 DIAGNOSIS — Z9861 Coronary angioplasty status: Secondary | ICD-10-CM | POA: Diagnosis not present

## 2015-05-28 DIAGNOSIS — Z791 Long term (current) use of non-steroidal anti-inflammatories (NSAID): Secondary | ICD-10-CM

## 2015-05-28 DIAGNOSIS — H9190 Unspecified hearing loss, unspecified ear: Secondary | ICD-10-CM | POA: Diagnosis not present

## 2015-05-28 DIAGNOSIS — R319 Hematuria, unspecified: Secondary | ICD-10-CM | POA: Diagnosis present

## 2015-05-28 DIAGNOSIS — R3914 Feeling of incomplete bladder emptying: Secondary | ICD-10-CM | POA: Diagnosis not present

## 2015-05-28 LAB — GLUCOSE, CAPILLARY
GLUCOSE-CAPILLARY: 102 mg/dL — AB (ref 65–99)
GLUCOSE-CAPILLARY: 116 mg/dL — AB (ref 65–99)
Glucose-Capillary: 157 mg/dL — ABNORMAL HIGH (ref 65–99)

## 2015-05-28 MED ORDER — ACETAMINOPHEN 325 MG PO TABS: 650.0000 mg | ORAL_TABLET | ORAL | Status: DC | PRN

## 2015-05-28 MED ORDER — SODIUM CHLORIDE 0.9 % IV SOLN
INTRAVENOUS | Status: DC
  Administered 2015-05-28 – 2015-06-01 (×3): via INTRAVENOUS

## 2015-05-28 MED ORDER — MORPHINE SULFATE 2 MG/ML IJ SOLN: 2.0000 mg | INTRAMUSCULAR | Status: DC | PRN

## 2015-05-28 MED ORDER — SENNOSIDES-DOCUSATE SODIUM 8.6-50 MG PO TABS: 1.0000 | ORAL_TABLET | Freq: Every evening | ORAL | Status: DC | PRN

## 2015-05-28 MED ORDER — OXYCODONE HCL 5 MG PO TABS
5.0000 mg | ORAL_TABLET | ORAL | Status: DC | PRN
  Administered 2015-05-31: 5 mg via ORAL
  Filled 2015-05-28 (×2): qty 1

## 2015-05-28 MED ORDER — ONDANSETRON HCL 4 MG/2ML IJ SOLN
4.0000 mg | INTRAMUSCULAR | Status: DC | PRN
Start: 2015-05-28 — End: 2015-06-01

## 2015-05-28 MED ORDER — DIPHENHYDRAMINE HCL 12.5 MG/5ML PO ELIX: 12.5000 mg | ORAL_SOLUTION | Freq: Four times a day (QID) | ORAL | Status: DC | PRN

## 2015-05-28 MED ORDER — DIPHENHYDRAMINE HCL 50 MG/ML IJ SOLN: 12.5000 mg | Freq: Four times a day (QID) | INTRAMUSCULAR | Status: DC | PRN

## 2015-05-28 MED ORDER — CEFTRIAXONE SODIUM IN DEXTROSE 20 MG/ML IV SOLN
1.0000 g | INTRAVENOUS | Status: DC
  Administered 2015-05-28: 1 g via INTRAVENOUS
  Filled 2015-05-28 (×2): qty 50

## 2015-05-28 MED ORDER — INSULIN ASPART 100 UNIT/ML ~~LOC~~ SOLN
0.0000 [IU] | Freq: Three times a day (TID) | SUBCUTANEOUS | Status: DC
  Administered 2015-05-29: 3 [IU] via SUBCUTANEOUS
  Administered 2015-05-30: 2 [IU] via SUBCUTANEOUS
  Administered 2015-05-30 – 2015-05-31 (×2): 3 [IU] via SUBCUTANEOUS

## 2015-05-28 MED ORDER — INSULIN ASPART 100 UNIT/ML ~~LOC~~ SOLN: 0.0000 [IU] | Freq: Every day | SUBCUTANEOUS | Status: DC

## 2015-05-28 MED ORDER — BISACODYL 10 MG RE SUPP: 10.0000 mg | Freq: Every day | RECTAL | Status: DC | PRN

## 2015-05-28 NOTE — H&P (Signed)
Urology Admission H&P  Chief Complaint: Gross hematuria  History of Present Illness: Mr Faciane is a 69yo with a hx of prostate cancer and NMIBC who presented to my office today with a 2 day hx of gross hematuria. He underwent office cystoscopy 3 days ago. Currently he has urinary frequency, urgency, dysuria and a feeling of incomplete emptying. NO hx of UTI. Of note, he is on plavix for an upper extremity arterial stent. He denies any fevers/chills/sweats. On bladder US in the office he was found to be in clot retention. A 3 way foley was placed and clot was evacuated. His urine failed to clear with hand irrigation.  Past Medical History  Diagnosis Date  . Diabetes mellitus, type 2   . Hypertension   . Bladder cancer   . Elevated PSA   . CAD (coronary artery disease)     a. 06/2014: NSTEMI s/p PTCA of LCx, 60-80% residual in distal LAD, 70% prox small D1.   b. 02/06/15 NSTEMI  s/p successful PCI/DES to mid RCA  . HOH (hard of hearing)   . HLD (hyperlipidemia)    Past Surgical History  Procedure Laterality Date  . Transurethral resection of bladder tumor N/A 03/14/2013    Procedure: TRANSURETHRAL RESECTION OF BLADDER TUMOR (TURBT);  Surgeon: [name], MD;  Location: [facility];  Service: Urology;  Laterality: N/A;  1 HR WITH MITOMYCIN INSTILLATION   . Transurethral resection of bladder tumor N/A 09/30/2013    Procedure: TRANSURETHRAL RESECTION OF BLADDER TUMOR (TURBT);  Surgeon: [name], MD;  Location: [facility];  Service: Urology;  Laterality: N/A;  . Transurethral resection of bladder tumor N/A 03/31/2014    Procedure: TRANSURETHRAL RESECTION OF BLADDER TUMOR (TURBT);  Surgeon: [name], MD;  Location: [facility];  Service: Urology;  Laterality: N/A;  . Prostate biopsy N/A 03/31/2014    Procedure: BIOPSY TRANSRECTAL ULTRASONIC PROSTATE (TUBP);  Surgeon: [name], MD;  Location: [facility];   Service: Urology;  Laterality: N/A;  . Left heart catheterization with coronary angiogram N/A 06/17/2014    Procedure: LEFT HEART CATHETERIZATION WITH CORONARY ANGIOGRAM;  Surgeon: [name], MD;  Location: [facility] CATH LAB;  Service: Cardiovascular;  Laterality: N/A;  . Left heart catheterization with coronary angiogram N/A 02/06/2015    Procedure: LEFT HEART CATHETERIZATION WITH CORONARY ANGIOGRAM;  Surgeon: [name], MD;  Location: [facility] CATH LAB;  Service: Cardiovascular;  Laterality: N/A;  . Cardiac catheterization  02/06/2015    Procedure: CORONARY STENT INTERVENTION;  Surgeon: [name], MD;  Location: [facility] CATH LAB;  Service: Cardiovascular;;  Mid RCA  . Cystoscopy w/ retrogrades N/A 05/05/2015    Procedure: CYSTOSCOPY WITH BILATERAL RETROGRADE PYELOGRAMS AND BLADDER BIOPSIES;  Surgeon: [name], MD;  Location: [facility];  Service: Urology;  Laterality: N/A;    Home Medications:  Prescriptions prior to admission  Medication Sig Dispense Refill Last Dose  . aspirin 81 MG chewable tablet Chew 1 tablet (81 mg total) by mouth daily.   05/27/2015  . clopidogrel (PLAVIX) 75 MG tablet Take 1 tablet (75 mg total) by mouth daily with breakfast. 30 tablet 12 05/27/2015  . glimepiride (AMARYL) 2 MG tablet Take 2 mg by mouth daily.    05/27/2015  . losartan (COZAAR) 50 MG tablet Take 1 tablet (50 mg total) by mouth daily. 30 tablet 12 05/27/2015  . metFORMIN (GLUCOPHAGE) 500 MG tablet Take 1 tablet (500 mg total) by mouth 2 (two) times daily with  a meal. 60 tablet 11 05/27/2015  . metoprolol succinate (TOPROL-XL) 25 MG 24 hr tablet Take 1 tablet (25 mg total) by mouth daily. 30 tablet 12 05/27/2015 at 10am  . naproxen sodium (ALEVE) 220 MG tablet Take 220-440 mg by mouth every 8 (eight) hours as needed (For pain.).   Past Week  . nitroGLYCERIN (NITROSTAT) 0.4 MG SL tablet Place 1 tablet (0.4 mg total) under the tongue every 5 (five) minutes x 3 doses as needed for chest pain. 25 tablet 12 never  used  . Polyethyl Glycol-Propyl Glycol (SYSTANE OP) Place 1-2 drops into both eyes daily as needed (For dry eyes.).   05/25/2015  . cephALEXin (KEFLEX) 500 MG capsule Take 1 capsule (500 mg total) by mouth 2 (two) times daily. (Patient not taking: Reported on 05/28/2015) 6 capsule 0 Not Taking  . HYDROcodone-acetaminophen (NORCO) 5-325 MG per tablet Take 1-2 tablets by mouth every 4 (four) hours as needed for moderate pain. (Patient not taking: Reported on 05/28/2015) 15 tablet 0 Not Taking   Allergies:  Allergies  Allergen Reactions  . Lipitor [Atorvastatin] Other (See Comments)    Myalgia  . Other Other (See Comments)    All Cholesterol Meds - "Don't agree with me"  . Pravastatin Other (See Comments)    Myalgia    History reviewed. No pertinent family history. Social History:  reports that he quit smoking about 22 years ago. His smoking use included Cigarettes. He has a 30 pack-year smoking history. He has never used smokeless tobacco. He reports that he does not drink alcohol or use illicit drugs.  Review of Systems  All other systems reviewed and are negative.   Physical Exam:  Vital signs in last 24 hours: Temp:  [98.3 F (36.8 C)] 98.3 F (36.8 C) (06/23 1322) Pulse Rate:  [80] 80 (06/23 1322) Resp:  [20] 20 (06/23 1322) BP: (158)/(84) 158/84 mmHg (06/23 1322) SpO2:  [95 %] 95 % (06/23 1322) Weight:  [89.3 kg (196 lb 13.9 oz)] 89.3 kg (196 lb 13.9 oz) (06/23 1322) Physical Exam  Constitutional: He is oriented to person, place, and time. He appears well-developed and well-nourished. No distress.  HENT:  Head: Normocephalic and atraumatic.  Eyes: EOM are normal. Pupils are equal, round, and reactive to light.  Neck: Normal range of motion. Neck supple.  Cardiovascular: Normal rate and regular rhythm.   Respiratory: Effort normal and breath sounds normal.  GI: Soft. He exhibits no distension. There is no tenderness. There is no rebound.  Genitourinary: Penis normal. No  penile tenderness.  Musculoskeletal: Normal range of motion.  Neurological: He is alert and oriented to person, place, and time.  Skin: Skin is warm and dry.  Psychiatric: He has a normal mood and affect. His behavior is normal. Judgment and thought content normal.    Laboratory Data:  Results for orders placed or performed during the hospital encounter of 05/28/15 (from the past 24 hour(s))  Glucose, capillary     Status: Abnormal   Collection Time: 05/28/15  2:53 PM  Result Value Ref Range   Glucose-Capillary 102 (H) 65 - 99 mg/dL   No results found for this or any previous visit (from the past 240 hour(s)). Creatinine: No results for input(s): CREATININE in the last 168 hours.  Impression/Assessment:  Gross Hematuria  Plan:  1. Admit to hospital, observation 2. CBI 3. Urine for culture and pt started on Rocephin 4. If hematuria fails to clear, pt may need operative intervention 5. Restart home meds  Georgia Delsignore L 05/28/2015, 4:12 PM

## 2015-05-28 NOTE — Progress Notes (Signed)
Paged Dr. Alyson Ingles. Informed that CBG @ 1453 was 102 and @ 1720 was 157, however, pt's wife stated that he had just eaten at 1700. Dr. Alyson Ingles instructed me not to give insulin at this time. Lind Guest, RN

## 2015-05-29 LAB — GLUCOSE, CAPILLARY
GLUCOSE-CAPILLARY: 157 mg/dL — AB (ref 65–99)
GLUCOSE-CAPILLARY: 102 mg/dL — AB (ref 65–99)
Glucose-Capillary: 117 mg/dL — ABNORMAL HIGH (ref 65–99)
Glucose-Capillary: 126 mg/dL — ABNORMAL HIGH (ref 65–99)

## 2015-05-29 MED ORDER — SULFAMETHOXAZOLE-TRIMETHOPRIM 800-160 MG PO TABS
1.0000 | ORAL_TABLET | Freq: Two times a day (BID) | ORAL | Status: DC
  Administered 2015-05-29 – 2015-05-31 (×5): 1 via ORAL
  Filled 2015-05-29 (×5): qty 1

## 2015-05-29 NOTE — Progress Notes (Signed)
  Subjective: Patient reports bladder spasms. He is in in no pain, however.  Objective: Vital signs in last 24 hours: Temp:  [98.2 F (36.8 C)-98.5 F (36.9 C)] 98.5 F (36.9 C) (06/24 0442) Pulse Rate:  [75-88] 75 (06/24 0442) Resp:  [20] 20 (06/24 0442) BP: (142-160)/(81-98) 142/81 mmHg (06/24 0442) SpO2:  [95 %-100 %] 100 % (06/24 0442) Weight:  [196 lb 13.9 oz (89.3 kg)] 196 lb 13.9 oz (89.3 kg) (06/23 1322)  Intake/Output from previous day: 06/23 0701 - 06/24 0700 In: 541.7 [I.V.:491.7; IV Piggyback:50] Out: 62836 [OQHUT:65465] Intake/Output this shift: Total I/O In: 200 [I.V.:200] Out: 03546 [Urine:13725]  Physical Exam:  Constitutional: Vital signs reviewed. WD WN in NAD   Eyes: PERRL, No scleral icterus.   Pulmonary/Chest: Normal effort    Lab Results: No results for input(s): HGB, HCT in the last 72 hours. BMET No results for input(s): NA, K, CL, CO2, GLUCOSE, BUN, CREATININE, CALCIUM in the last 72 hours. No results for input(s): LABPT, INR in the last 72 hours. No results for input(s): LABURIN in the last 72 hours. Results for orders placed or performed during the hospital encounter of 06/17/14  MRSA PCR Screening     Status: None   Collection Time: 06/17/14 12:08 PM  Result Value Ref Range Status   MRSA by PCR NEGATIVE NEGATIVE Final    Comment:        The GeneXpert MRSA Assay (FDA approved for NASAL specimens only), is one component of a comprehensive MRSA colonization surveillance program. It is not intended to diagnose MRSA infection nor to guide or monitor treatment for MRSA infections.   Bladder irrigated with 500 mL of saline. A few small clots were obtained. He was re-hooked to continuous bladder irrigation Studies/Results: No results found.  Assessment/Plan:   Gross hematuria. He did have bladder biopsy about a month ago. He did not have recent instrumentation at his office visit, however. I do agree with antibody-L switch him over to  oral, however. We will leave CBI going. We will taken today to time over the weekend to see how he does with his urine clearing. Perhaps he will be okay for a voiding trial tomorrow.     Jorja Loa 05/29/2015, 6:37 AM

## 2015-05-30 LAB — CBC WITH DIFFERENTIAL/PLATELET
Basophils Absolute: 0 10*3/uL (ref 0.0–0.1)
Basophils Relative: 0 % (ref 0–1)
EOS ABS: 0.3 10*3/uL (ref 0.0–0.7)
Eosinophils Relative: 4 % (ref 0–5)
HCT: 38.8 % — ABNORMAL LOW (ref 39.0–52.0)
HEMOGLOBIN: 13 g/dL (ref 13.0–17.0)
LYMPHS PCT: 29 % (ref 12–46)
Lymphs Abs: 2.2 10*3/uL (ref 0.7–4.0)
MCH: 30.1 pg (ref 26.0–34.0)
MCHC: 33.5 g/dL (ref 30.0–36.0)
MCV: 89.8 fL (ref 78.0–100.0)
Monocytes Absolute: 0.5 10*3/uL (ref 0.1–1.0)
Monocytes Relative: 7 % (ref 3–12)
NEUTROS ABS: 4.4 10*3/uL (ref 1.7–7.7)
Neutrophils Relative %: 60 % (ref 43–77)
Platelets: 197 10*3/uL (ref 150–400)
RBC: 4.32 MIL/uL (ref 4.22–5.81)
RDW: 14.7 % (ref 11.5–15.5)
WBC: 7.4 10*3/uL (ref 4.0–10.5)

## 2015-05-30 LAB — GLUCOSE, CAPILLARY
GLUCOSE-CAPILLARY: 138 mg/dL — AB (ref 65–99)
GLUCOSE-CAPILLARY: 152 mg/dL — AB (ref 65–99)
GLUCOSE-CAPILLARY: 118 mg/dL — AB (ref 65–99)
Glucose-Capillary: 137 mg/dL — ABNORMAL HIGH (ref 65–99)

## 2015-05-30 LAB — BASIC METABOLIC PANEL
Anion gap: 6 (ref 5–15)
BUN: 13 mg/dL (ref 6–20)
CHLORIDE: 105 mmol/L (ref 101–111)
CO2: 26 mmol/L (ref 22–32)
Calcium: 8.4 mg/dL — ABNORMAL LOW (ref 8.9–10.3)
Creatinine, Ser: 1.27 mg/dL — ABNORMAL HIGH (ref 0.61–1.24)
GFR calc non Af Amer: 56 mL/min — ABNORMAL LOW (ref >60)
Glucose, Bld: 132 mg/dL — ABNORMAL HIGH (ref 65–99)
POTASSIUM: 4.1 mmol/L (ref 3.5–5.1)
SODIUM: 137 mmol/L (ref 135–145)

## 2015-05-30 MED ORDER — NITROGLYCERIN 0.4 MG SL SUBL: 0.4000 mg | SUBLINGUAL_TABLET | SUBLINGUAL | Status: DC | PRN

## 2015-05-30 MED ORDER — SENNOSIDES-DOCUSATE SODIUM 8.6-50 MG PO TABS
1.0000 | ORAL_TABLET | Freq: Two times a day (BID) | ORAL | Status: DC
  Administered 2015-05-30 – 2015-05-31 (×3): 1 via ORAL
  Filled 2015-05-30 (×3): qty 1

## 2015-05-30 MED ORDER — LOSARTAN POTASSIUM 50 MG PO TABS
50.0000 mg | ORAL_TABLET | Freq: Every day | ORAL | Status: DC
  Administered 2015-05-30: 50 mg via ORAL
  Filled 2015-05-30: qty 1

## 2015-05-30 MED ORDER — METOPROLOL SUCCINATE ER 25 MG PO TB24
25.0000 mg | ORAL_TABLET | Freq: Every day | ORAL | Status: DC
  Administered 2015-05-30 – 2015-05-31 (×2): 25 mg via ORAL
  Filled 2015-05-30 (×2): qty 1

## 2015-05-30 NOTE — Progress Notes (Signed)
Pt c/o of pressure in pelvic area and the need to urinate. Hand irrigated catheter with a total of 120 cc of NS. multiple dime size clots returned. Foley placed back to straight drain and CBI increased to a moderate drip with cherry color drainage noted in catheter tube. Pt stated" the pressure is gone." Will continue to monitor.

## 2015-05-30 NOTE — Progress Notes (Signed)
Pt c/o pressure in pelvic area. Writer hand irrigated catheter with a total of 200cc of NS. Multiple dime size clots returned. Catheter placed back to straight drain and CBI resumed at a moderate rate. Cherry red drainage noted draining from catheter. Pt stated " I feel much better." Will continue to monitor.

## 2015-05-30 NOTE — Progress Notes (Signed)
  Subjective:  1 - Gross Hematuria with Urinary Retention - new clot retention 6/23 promping admission for bladder irrigation. 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 On plavix at baseline and prior to this admission. Was clear initially after recent surgery.  Today Timothy [name] is w/o specific complaints. Urine still bloody with occasional clots. Hgb acceptable, Lytes acceptable, afebrile.   Objective: Vital signs in last 24 hours: Temp:  [98.1 F (36.7 C)-98.6 F (37 C)] 98.1 F (36.7 C) (06/25 0524) Pulse Rate:  [78-85] 78 (06/25 0524) Resp:  [18-20] 18 (06/25 0524) BP: (133-145)/(74-85) 138/78 mmHg (06/25 0524) SpO2:  [100 %] 100 % (06/25 0524) Last BM Date: 05/27/15  Intake/Output from previous day: 06/24 0701 - 06/25 0700 In: 30865 [P.O.:480; I.V.:1175] Out: 78469 [Urine:17675] Intake/Output this shift:    General appearance: alert, cooperative and appears stated age Neck: supple, symmetrical, trachea midline Back: symmetric, no curvature. ROM normal. No CVA tenderness. Resp: non-labored on room air Cardio: Nl rate GI: soft, non-tender; bowel sounds normal; no masses,  no organomegaly Male genitalia: normal Extremities: extremities normal, atraumatic, no cyanosis or edema Pulses: 2+ and symmetric Skin: Skin color, texture, turgor normal. No rashes or lesions Lymph nodes: Cervical, supraclavicular, and axillary nodes normal. 3 way foley on NS irrigation 1gtt / second, output red wine colored w/o clots at present.   Lab Results:   Recent Labs  05/30/15 0430  WBC 7.4  HGB 13.0  HCT 38.8*  PLT 197   BMET  Recent Labs  05/30/15 0430  NA 137  K 4.1  CL 105  CO2 26  GLUCOSE 132*  BUN 13  CREATININE 1.27*  CALCIUM 8.4*   PT/INR No results for input(s): LABPROT, INR in the last 72 hours. ABG No results for input(s): PHART, HCO3 in the last 72 hours.  Invalid input(s): PCO2, PO2  Studies/Results: No results found.  Anti-infectives: Anti-infectives    Start      Dose/Rate Route Frequency Ordered Stop   05/29/15 1000  sulfamethoxazole-trimethoprim (BACTRIM DS,SEPTRA DS) 800-160 MG per tablet 1 tablet     1 tablet Oral Every 12 hours 05/29/15 0743     05/28/15 1330  cefTRIAXone (ROCEPHIN) 1 g in dextrose 5 % 50 mL IVPB - Premix  Status:  Discontinued     1 g 100 mL/hr over 30 Minutes Intravenous Every 24 hours 05/28/15 1234 05/29/15 0743      Assessment/Plan:  1 - Gross Hematuria with Urinary Retention - Check CBC, BMP daily while on irrigation. NPO p MN as if he is failing to clear significantly by tomorrow AM I feel operative cysto / fulguration is warranted. Pt and family in agreement. Continue hold plavix given continued bleed.    [facility], Timothy [name] 05/30/2015

## 2015-05-31 ENCOUNTER — Encounter (HOSPITAL_COMMUNITY)
Admission: AD | Disposition: A | Discharge status: Home / Self Care | Payer: Self-pay | Source: Ambulatory Visit | Attending: Urology

## 2015-05-31 ENCOUNTER — Observation Stay (HOSPITAL_COMMUNITY): Payer: Medicare Other | Admitting: Certified Registered Nurse Anesthetist

## 2015-05-31 ENCOUNTER — Encounter (HOSPITAL_COMMUNITY): Payer: Self-pay | Admitting: Certified Registered Nurse Anesthetist

## 2015-05-31 DIAGNOSIS — Z87891 Personal history of nicotine dependence: Secondary | ICD-10-CM | POA: Diagnosis not present

## 2015-05-31 DIAGNOSIS — N3289 Other specified disorders of bladder: Secondary | ICD-10-CM | POA: Diagnosis not present

## 2015-05-31 DIAGNOSIS — Z8551 Personal history of malignant neoplasm of bladder: Secondary | ICD-10-CM | POA: Diagnosis not present

## 2015-05-31 DIAGNOSIS — Z79899 Other long term (current) drug therapy: Secondary | ICD-10-CM | POA: Diagnosis not present

## 2015-05-31 DIAGNOSIS — R309 Painful micturition, unspecified: Secondary | ICD-10-CM | POA: Diagnosis not present

## 2015-05-31 DIAGNOSIS — R339 Retention of urine, unspecified: Secondary | ICD-10-CM | POA: Diagnosis present

## 2015-05-31 DIAGNOSIS — E785 Hyperlipidemia, unspecified: Secondary | ICD-10-CM | POA: Diagnosis present

## 2015-05-31 DIAGNOSIS — R310 Gross hematuria: Secondary | ICD-10-CM | POA: Diagnosis not present

## 2015-05-31 DIAGNOSIS — N029 Recurrent and persistent hematuria with unspecified morphologic changes: Secondary | ICD-10-CM | POA: Diagnosis not present

## 2015-05-31 DIAGNOSIS — R319 Hematuria, unspecified: Secondary | ICD-10-CM | POA: Diagnosis present

## 2015-05-31 DIAGNOSIS — Z9861 Coronary angioplasty status: Secondary | ICD-10-CM | POA: Diagnosis not present

## 2015-05-31 DIAGNOSIS — Z791 Long term (current) use of non-steroidal anti-inflammatories (NSAID): Secondary | ICD-10-CM | POA: Diagnosis not present

## 2015-05-31 DIAGNOSIS — Z8546 Personal history of malignant neoplasm of prostate: Secondary | ICD-10-CM | POA: Diagnosis not present

## 2015-05-31 DIAGNOSIS — Z7902 Long term (current) use of antithrombotics/antiplatelets: Secondary | ICD-10-CM | POA: Diagnosis not present

## 2015-05-31 DIAGNOSIS — E119 Type 2 diabetes mellitus without complications: Secondary | ICD-10-CM | POA: Diagnosis present

## 2015-05-31 DIAGNOSIS — R350 Frequency of micturition: Secondary | ICD-10-CM | POA: Diagnosis not present

## 2015-05-31 DIAGNOSIS — Z7982 Long term (current) use of aspirin: Secondary | ICD-10-CM | POA: Diagnosis not present

## 2015-05-31 DIAGNOSIS — H9190 Unspecified hearing loss, unspecified ear: Secondary | ICD-10-CM | POA: Diagnosis present

## 2015-05-31 DIAGNOSIS — I252 Old myocardial infarction: Secondary | ICD-10-CM | POA: Diagnosis not present

## 2015-05-31 DIAGNOSIS — I10 Essential (primary) hypertension: Secondary | ICD-10-CM | POA: Diagnosis not present

## 2015-05-31 DIAGNOSIS — R3914 Feeling of incomplete bladder emptying: Secondary | ICD-10-CM | POA: Diagnosis not present

## 2015-05-31 DIAGNOSIS — I2510 Atherosclerotic heart disease of native coronary artery without angina pectoris: Secondary | ICD-10-CM | POA: Diagnosis not present

## 2015-05-31 DIAGNOSIS — Z888 Allergy status to other drugs, medicaments and biological substances status: Secondary | ICD-10-CM | POA: Diagnosis not present

## 2015-05-31 HISTORY — PX: TRANSURETHRAL RESECTION OF BLADDER TUMOR: SHX2575

## 2015-05-31 LAB — CBC WITH DIFFERENTIAL/PLATELET
Basophils Absolute: 0 10*3/uL (ref 0.0–0.1)
Basophils Relative: 0 % (ref 0–1)
Eosinophils Absolute: 0.3 10*3/uL (ref 0.0–0.7)
Eosinophils Relative: 4 % (ref 0–5)
HCT: 38.1 % — ABNORMAL LOW (ref 39.0–52.0)
HEMOGLOBIN: 13 g/dL (ref 13.0–17.0)
Lymphocytes Relative: 26 % (ref 12–46)
Lymphs Abs: 2 10*3/uL (ref 0.7–4.0)
MCH: 30.6 pg (ref 26.0–34.0)
MCHC: 34.1 g/dL (ref 30.0–36.0)
MCV: 89.6 fL (ref 78.0–100.0)
Monocytes Absolute: 0.7 10*3/uL (ref 0.1–1.0)
Monocytes Relative: 10 % (ref 3–12)
NEUTROS PCT: 60 % (ref 43–77)
Neutro Abs: 4.7 10*3/uL (ref 1.7–7.7)
PLATELETS: 214 10*3/uL (ref 150–400)
RBC: 4.25 MIL/uL (ref 4.22–5.81)
RDW: 14.5 % (ref 11.5–15.5)
WBC: 7.8 10*3/uL (ref 4.0–10.5)

## 2015-05-31 LAB — GLUCOSE, CAPILLARY
Glucose-Capillary: 119 mg/dL — ABNORMAL HIGH (ref 65–99)
Glucose-Capillary: 143 mg/dL — ABNORMAL HIGH (ref 65–99)
Glucose-Capillary: 137 mg/dL — ABNORMAL HIGH (ref 65–99)
Glucose-Capillary: 162 mg/dL — ABNORMAL HIGH (ref 65–99)
Glucose-Capillary: 177 mg/dL — ABNORMAL HIGH (ref 65–99)

## 2015-05-31 LAB — BASIC METABOLIC PANEL
Anion gap: 8 (ref 5–15)
BUN: 13 mg/dL (ref 6–20)
CO2: 25 mmol/L (ref 22–32)
CREATININE: 1.43 mg/dL — AB (ref 0.61–1.24)
Calcium: 8.8 mg/dL — ABNORMAL LOW (ref 8.9–10.3)
Chloride: 106 mmol/L (ref 101–111)
GFR calc Af Amer: 57 mL/min — ABNORMAL LOW (ref >60)
GFR, EST NON AFRICAN AMERICAN: 49 mL/min — AB (ref >60)
Glucose, Bld: 136 mg/dL — ABNORMAL HIGH (ref 65–99)
Potassium: 4.2 mmol/L (ref 3.5–5.1)
Sodium: 139 mmol/L (ref 135–145)

## 2015-05-31 LAB — SURGICAL PCR SCREEN
MRSA, PCR: NEGATIVE
Staphylococcus aureus: NEGATIVE

## 2015-05-31 SURGERY — TURBT (TRANSURETHRAL RESECTION OF BLADDER TUMOR)
Anesthesia: General

## 2015-05-31 MED ORDER — HYDROMORPHONE HCL 1 MG/ML IJ SOLN
0.2500 mg | INTRAMUSCULAR | Status: DC | PRN
  Administered 2015-05-31 (×2): 0.5 mg via INTRAVENOUS

## 2015-05-31 MED ORDER — MIDAZOLAM HCL 5 MG/5ML IJ SOLN
INTRAMUSCULAR | Status: DC | PRN
  Administered 2015-05-31: 2 mg via INTRAVENOUS

## 2015-05-31 MED ORDER — SODIUM CHLORIDE 0.9 % IR SOLN
Status: DC | PRN
  Administered 2015-05-31: 6000 mL via INTRAVESICAL

## 2015-05-31 MED ORDER — ONDANSETRON HCL 4 MG/2ML IJ SOLN
INTRAMUSCULAR | Status: DC | PRN
  Administered 2015-05-31: 4 mg via INTRAVENOUS

## 2015-05-31 MED ORDER — PROPOFOL 10 MG/ML IV BOLUS
INTRAVENOUS | Status: DC | PRN
  Administered 2015-05-31: 200 mg via INTRAVENOUS

## 2015-05-31 MED ORDER — PROPOFOL 10 MG/ML IV BOLUS
INTRAVENOUS | Status: AC
  Filled 2015-05-31: qty 20

## 2015-05-31 MED ORDER — MEPERIDINE HCL 50 MG/ML IJ SOLN: 6.2500 mg | INTRAMUSCULAR | Status: DC | PRN

## 2015-05-31 MED ORDER — CEFTRIAXONE SODIUM IN DEXTROSE 20 MG/ML IV SOLN
1.0000 g | Freq: Once | INTRAVENOUS | Status: AC
  Administered 2015-05-31: 1 g via INTRAVENOUS
  Filled 2015-05-31: qty 50

## 2015-05-31 MED ORDER — PHENYLEPHRINE 40 MCG/ML (10ML) SYRINGE FOR IV PUSH (FOR BLOOD PRESSURE SUPPORT)
PREFILLED_SYRINGE | INTRAVENOUS | Status: AC
  Filled 2015-05-31: qty 10

## 2015-05-31 MED ORDER — MIDAZOLAM HCL 2 MG/2ML IJ SOLN
INTRAMUSCULAR | Status: AC
  Filled 2015-05-31: qty 2

## 2015-05-31 MED ORDER — ONDANSETRON HCL 4 MG/2ML IJ SOLN
INTRAMUSCULAR | Status: AC
  Filled 2015-05-31: qty 2

## 2015-05-31 MED ORDER — LIDOCAINE HCL (CARDIAC) 20 MG/ML IV SOLN
INTRAVENOUS | Status: AC
  Filled 2015-05-31: qty 5

## 2015-05-31 MED ORDER — PROMETHAZINE HCL 25 MG/ML IJ SOLN: 6.2500 mg | INTRAMUSCULAR | Status: DC | PRN

## 2015-05-31 MED ORDER — LIDOCAINE HCL (CARDIAC) 20 MG/ML IV SOLN
INTRAVENOUS | Status: DC | PRN
  Administered 2015-05-31: 100 mg via INTRAVENOUS

## 2015-05-31 MED ORDER — FENTANYL CITRATE (PF) 100 MCG/2ML IJ SOLN
INTRAMUSCULAR | Status: DC | PRN
  Administered 2015-05-31 (×4): 25 ug via INTRAVENOUS

## 2015-05-31 MED ORDER — PHENYLEPHRINE HCL 10 MG/ML IJ SOLN
INTRAMUSCULAR | Status: DC | PRN
  Administered 2015-05-31 (×2): 80 ug via INTRAVENOUS

## 2015-05-31 MED ORDER — IOHEXOL 300 MG/ML  SOLN
INTRAMUSCULAR | Status: DC | PRN
  Administered 2015-05-31: 20 mL

## 2015-05-31 MED ORDER — FENTANYL CITRATE (PF) 100 MCG/2ML IJ SOLN
INTRAMUSCULAR | Status: AC
  Filled 2015-05-31: qty 2

## 2015-05-31 MED ORDER — HYDROMORPHONE HCL 1 MG/ML IJ SOLN
INTRAMUSCULAR | Status: AC
  Filled 2015-05-31: qty 1

## 2015-05-31 MED ORDER — LACTATED RINGERS IV SOLN
INTRAVENOUS | Status: DC | PRN
  Administered 2015-05-31: 08:00:00 via INTRAVENOUS

## 2015-05-31 SURGICAL SUPPLY — 19 items
BAG URINE DRAINAGE (UROLOGICAL SUPPLIES) ×1 IMPLANT
BAG URO CATCHER STRL LF (DRAPE) ×2 IMPLANT
CATH FOLEY 3WAY 30CC 24FR (CATHETERS) ×2
CATH INTERMIT  6FR 70CM (CATHETERS) ×1 IMPLANT
CATH URTH STD 24FR FL 3W 2 (CATHETERS) IMPLANT
ELECT COAG BIPOLAR CYL 1.2MMM (ELECTROSURGICAL) ×2
ELECT REM PT RETURN 9FT ADLT (ELECTROSURGICAL) ×2
ELECTRODE COAG BIPLR CYL 1.2MM (ELECTROSURGICAL) IMPLANT
ELECTRODE REM PT RTRN 9FT ADLT (ELECTROSURGICAL) ×1 IMPLANT
EVACUATOR MICROVAS BLADDER (UROLOGICAL SUPPLIES) IMPLANT
GLOVE BIOGEL M STRL SZ7.5 (GLOVE) ×2 IMPLANT
GOWN STRL REUS W/TWL LRG LVL3 (GOWN DISPOSABLE) ×4 IMPLANT
GUIDEWIRE STR DUAL SENSOR (WIRE) ×1 IMPLANT
KIT ASPIRATION TUBING (SET/KITS/TRAYS/PACK) IMPLANT
LOOP CUT BIPOLAR 24F LRG (ELECTROSURGICAL) IMPLANT
MANIFOLD NEPTUNE II (INSTRUMENTS) ×2 IMPLANT
PACK CYSTO (CUSTOM PROCEDURE TRAY) ×2 IMPLANT
SYRINGE IRR TOOMEY STRL 70CC (SYRINGE) ×1 IMPLANT
TUBING CONNECTING 10 (TUBING) ×2 IMPLANT

## 2015-05-31 NOTE — Brief Op Note (Signed)
05/28/2015 - 05/31/2015  9:17 AM  PATIENT:  Timothy [name]  69 y.o. male  PRE-OPERATIVE DIAGNOSIS:   persistant hematuria  POST-OPERATIVE DIAGNOSIS:   persistant hematuria  PROCEDURE:  Procedure(s): Cystoscopy with clott evacuation and fulgeration of bleeders, retrograde pylegram (N/A)  SURGEON:  Surgeon(s) and Role:    * [name], MD - Primary  PHYSICIAN ASSISTANT:   ASSISTANTS: none   ANESTHESIA:   general  EBL:  Total I/O In: 1200 [Other:1200] Out: 1300 [Urine:1300]  BLOOD ADMINISTERED:none  DRAINS: 14F 3 way foley, irrigation port plugged   LOCAL MEDICATIONS USED:  NONE  SPECIMEN:  No Specimen  DISPOSITION OF SPECIMEN:  N/A  COUNTS:  YES  TOURNIQUET:  * No tourniquets in log *  DICTATION: .Other Dictation: Dictation Number [id number]  PLAN OF CARE: Admit to inpatient   PATIENT DISPOSITION:  PACU - hemodynamically stable.   Delay start of Pharmacological VTE agent (>24hrs) due to surgical blood loss or risk of bleeding: yes

## 2015-05-31 NOTE — Anesthesia Procedure Notes (Signed)
Procedure Name: LMA Insertion Date/Time: 05/31/2015 8:49 AM Performed by: Maxwell Caul Pre-anesthesia Checklist: Patient identified, Emergency Drugs available, Suction available and Patient being monitored Patient Re-evaluated:Patient Re-evaluated prior to inductionOxygen Delivery Method: Circle system utilized Preoxygenation: Pre-oxygenation with 100% oxygen Intubation Type: IV induction LMA: LMA inserted LMA Size: 4.0 Number of attempts: 1 Tube secured with: Tape Dental Injury: Teeth and Oropharynx as per pre-operative assessment

## 2015-05-31 NOTE — Progress Notes (Signed)
  Subjective:  1 - Gross Hematuria with Urinary Retention - new clot retention 6/23 promping admission for bladder irrigation. 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 On plavix at baseline and prior to this admission. Was clear initially after recent surgery.  Today Timothy [name] is stable. Irrigation continues to clot off intromittently despite high-rate flow. Hgb stable. He has been off plavix now 4+ days.   Objective: Vital signs in last 24 hours: Temp:  [97.7 F (36.5 C)-98.3 F (36.8 C)] 97.7 F (36.5 C) (06/26 5784) Pulse Rate:  [69-78] 69 (06/26 0608) Resp:  [18-20] 20 (06/26 6962) BP: (134-148)/(76-84) 148/84 mmHg (06/26 0608) SpO2:  [99 %-100 %] 99 % (06/26 0608) Last BM Date: 05/27/15  Intake/Output from previous day: 06/25 0701 - 06/26 0700 In: 95284 [P.O.:540; I.V.:425] Out: 22195 [Urine:22195] Intake/Output this shift:    General appearance: alert, cooperative and appears stated age Eyes: negative Nose: Nares normal. Septum midline. Mucosa normal. No drainage or sinus tenderness. Throat: lips, mucosa, and tongue normal; teeth and gums normal Neck: supple, symmetrical, trachea midline Back: symmetric, no curvature. ROM normal. No CVA tenderness. Resp: non-labored Cardio: Nl rate GI: soft, non-tender; bowel sounds normal; no masses,  no organomegaly Male genitalia: normal Extremities: extremities normal, atraumatic, no cyanosis or edema Pulses: 2+ and symmetric Skin: Skin color, texture, turgor normal. No rashes or lesions Lymph nodes: Cervical, supraclavicular, and axillary nodes normal. Bladder irrrigation with wine colered efflux at 2gtt / second inflow, some clots in tubing. Currently flowing.   Lab Results:   Recent Labs  05/30/15 0430 05/31/15 0420  WBC 7.4 7.8  HGB 13.0 13.0  HCT 38.8* 38.1*  PLT 197 214   BMET  Recent Labs  05/30/15 0430 05/31/15 0420  NA 137 139  K 4.1 4.2  CL 105 106  CO2 26 25  GLUCOSE 132* 136*  BUN 13 13  CREATININE 1.27*  1.43*  CALCIUM 8.4* 8.8*   PT/INR No results for input(s): LABPROT, INR in the last 72 hours. ABG No results for input(s): PHART, HCO3 in the last 72 hours.  Invalid input(s): PCO2, PO2  Studies/Results: No results found.  Anti-infectives: Anti-infectives    Start     Dose/Rate Route Frequency Ordered Stop   05/29/15 1000  sulfamethoxazole-trimethoprim (BACTRIM DS,SEPTRA DS) 800-160 MG per tablet 1 tablet     1 tablet Oral Every 12 hours 05/29/15 0743     05/28/15 1330  cefTRIAXone (ROCEPHIN) 1 g in dextrose 5 % 50 mL IVPB - Premix  Status:  Discontinued     1 g 100 mL/hr over 30 Minutes Intravenous Every 24 hours 05/28/15 1234 05/29/15 0743      Assessment/Plan:  1 - Gross Hematuria with Urinary Retention - not clearing well despite several days bladder irrigation and holding antiplatelet agents. Discussed role of cysto-clot evacuation / fulguration with diagnostic and therapeutic intent to determine source of bleeding and fulgurate and hopefully speed up process of getting off irrigation. He wants to proceed and I agree. Risks, benefits, alternatives discussed.       [facility], Natiya Seelinger 05/31/2015

## 2015-05-31 NOTE — Anesthesia Postprocedure Evaluation (Signed)
Anesthesia Post Note  Patient: Timothy [name]  Procedure(s) Performed: Procedure(s) (LRB): Cystoscopy with clott evacuation and fulgeration of bleeders, retrograde pylegram (N/A)  Anesthesia type: General  Patient location: PACU  Post pain: Pain level controlled  Post assessment: Post-op Vital signs reviewed  Last Vitals:  Filed Vitals:   05/31/15 0933  BP: 141/95  Pulse: 75  Temp:   Resp:     Post vital signs: Reviewed  Level of consciousness: sedated  Complications: No apparent anesthesia complications

## 2015-05-31 NOTE — Transfer of Care (Signed)
Immediate Anesthesia Transfer of Care Note  Patient: Timothy [name]  Procedure(s) Performed: Procedure(s): Cystoscopy with clott evacuation and fulgeration of bleeders, retrograde pylegram (N/A)  Patient Location: PACU  Anesthesia Type:General  Level of Consciousness:  sedated, patient cooperative and responds to stimulation  Airway & Oxygen Therapy:Patient Spontanous Breathing and Patient connected to face mask oxgen  Post-op Assessment:  Report given to PACU RN and Post -op Vital signs reviewed and stable  Post vital signs:  Reviewed and stable  Last Vitals:  Filed Vitals:   05/31/15 0608  BP: 148/84  Pulse: 69  Temp: 36.5 C  Resp: 20    Complications: No apparent anesthesia complications

## 2015-05-31 NOTE — Anesthesia Preprocedure Evaluation (Signed)
Anesthesia Evaluation  Patient identified by MRN, date of birth, ID band Patient awake    Reviewed: Allergy & Precautions, H&P , NPO status , Patient's Chart, lab work & pertinent test results  Airway Mallampati: II  TM Distance: >3 FB Neck ROM: Full    Dental no notable dental hx.    Pulmonary former smoker,  breath sounds clear to auscultation  Pulmonary exam normal       Cardiovascular hypertension, Pt. on medications Normal cardiovascular exam    Neuro/Psych negative neurological ROS  negative psych ROS   GI/Hepatic negative GI ROS, Neg liver ROS,   Endo/Other  diabetes, Oral Hypoglycemic Agents  Renal/GU negative Renal ROS  negative genitourinary   Musculoskeletal negative musculoskeletal ROS (+)   Abdominal Normal abdominal exam  (+)   Peds negative pediatric ROS (+)  Hematology negative hematology ROS (+)   Anesthesia Other Findings   Reproductive/Obstetrics negative OB ROS                             Anesthesia Physical  Anesthesia Plan  ASA: III  Anesthesia Plan: General   Post-op Pain Management:    Induction: Intravenous  Airway Management Planned: LMA  Additional Equipment:   Intra-op Plan:   Post-operative Plan:   Informed Consent: I have reviewed the patients History and Physical, chart, labs and discussed the procedure including the risks, benefits and alternatives for the proposed anesthesia with the patient or authorized representative who has indicated his/her understanding and acceptance.     Plan Discussed with: CRNA and Surgeon  Anesthesia Plan Comments:         Anesthesia Quick Evaluation

## 2015-05-31 NOTE — Progress Notes (Signed)
Pt c/o of pressure, CBI flow decreased with reflux backup noted in the tubing, hand irrigated and flow started again, moderate to fast rate going on CBI, with return.  Will cont to monitor. SRP, RN

## 2015-05-31 NOTE — Progress Notes (Addendum)
At the start of the shift (7p-7a)  moderate flow noted after manuel irrigation of tube--flow was change to fast . NO additional irrigation needed at this time. Urine remain bld tinged--light cherry colored due to diluted urine from irrigation. SRP, RN

## 2015-06-01 ENCOUNTER — Other Ambulatory Visit: Payer: Self-pay

## 2015-06-01 ENCOUNTER — Encounter (HOSPITAL_COMMUNITY): Payer: Self-pay | Admitting: Urology

## 2015-06-01 LAB — BASIC METABOLIC PANEL
Anion gap: 7 (ref 5–15)
BUN: 16 mg/dL (ref 6–20)
CO2: 26 mmol/L (ref 22–32)
Calcium: 8.6 mg/dL — ABNORMAL LOW (ref 8.9–10.3)
Chloride: 104 mmol/L (ref 101–111)
Creatinine, Ser: 1.42 mg/dL — ABNORMAL HIGH (ref 0.61–1.24)
GFR, EST AFRICAN AMERICAN: 57 mL/min — AB (ref >60)
GFR, EST NON AFRICAN AMERICAN: 49 mL/min — AB (ref >60)
Glucose, Bld: 114 mg/dL — ABNORMAL HIGH (ref 65–99)
POTASSIUM: 4.5 mmol/L (ref 3.5–5.1)
Sodium: 137 mmol/L (ref 135–145)

## 2015-06-01 LAB — CBC WITH DIFFERENTIAL/PLATELET
Basophils Absolute: 0 10*3/uL (ref 0.0–0.1)
Basophils Relative: 0 % (ref 0–1)
EOS PCT: 3 % (ref 0–5)
Eosinophils Absolute: 0.3 10*3/uL (ref 0.0–0.7)
HCT: 35.5 % — ABNORMAL LOW (ref 39.0–52.0)
HEMOGLOBIN: 12.1 g/dL — AB (ref 13.0–17.0)
LYMPHS ABS: 2.2 10*3/uL (ref 0.7–4.0)
LYMPHS PCT: 26 % (ref 12–46)
MCH: 30.8 pg (ref 26.0–34.0)
MCHC: 34.1 g/dL (ref 30.0–36.0)
MCV: 90.3 fL (ref 78.0–100.0)
MONO ABS: 0.7 10*3/uL (ref 0.1–1.0)
Monocytes Relative: 8 % (ref 3–12)
Neutro Abs: 5.5 10*3/uL (ref 1.7–7.7)
Neutrophils Relative %: 63 % (ref 43–77)
Platelets: 213 10*3/uL (ref 150–400)
RBC: 3.93 MIL/uL — ABNORMAL LOW (ref 4.22–5.81)
RDW: 14.7 % (ref 11.5–15.5)
WBC: 8.6 10*3/uL (ref 4.0–10.5)

## 2015-06-01 LAB — GLUCOSE, CAPILLARY: GLUCOSE-CAPILLARY: 124 mg/dL — AB (ref 65–99)

## 2015-06-01 NOTE — Discharge Instructions (Signed)
1. You may see some blood in the urine and may have some burning with urination for 48-72 hours. You also may notice that you have to urinate more frequently or urgently after your procedure which is normal.  2. You should call should you develop an inability urinate, fever > 101, persistent nausea and vomiting that prevents you from eating or drinking to stay hydrated.  3. It is okay to restart your blood thinner on Wednesday if your urine is still clear. Hold off on your aspirin until that time as well.

## 2015-06-01 NOTE — Op Note (Signed)
Timothy [name], Timothy [name]           ACCOUNT NO.:  [id number]  MEDICAL RECORD NO.:  [id number]  LOCATION:  92                         FACILITY:  [facility]  PHYSICIAN:  [name], MD     DATE OF BIRTH:  October 03, 1946  DATE OF PROCEDURE:  05/31/2015                              OPERATIVE REPORT   PREOPERATIVE DIAGNOSIS:  Recurrent gross hematuria with clots.  PROCEDURE: 1. Cystoscopy with clot evacuation and fulguration of bleeders. 2. Bilateral retrograde pyelogram interpretation.  ESTIMATED BLOOD LOSS:  150 mL of old clot, scant new blood.  CATHETER:  A 24-French 3-way Foley catheter, 10 mL sterile water in the balloon.  Irrigation port plugged.  FINDINGS: 1. Mild bladder erythema.  Scant active oozing from prior biopsy sites     and bladder neck. 2. Approximately 150 mL of old formed clot of the urinary bladder.     This was irrigated to clear. 3. Unremarkable bilateral retrograde pyelogram. 4. Old resection biopsy site very near to left ureteral orifice but     not directly involving, and unremarkable left retrograde pyelogram,     suggesting continued patency of left ureter.  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  He had a positive cytology, therefore underwent operative cysto, bladder biopsy at the end of May and did very well initially.  He restarted his antiplatelet therapy, however, he subsequently developed a gross hematuria with clots.  He was seen in the office towards the end of last week, where he was found to be in clot retention.  A catheter was placed.  He was admitted for bladder irrigation.  Unfortunately, his irrigation needed to be maintained and he has had some persistent need for flushing despite being off his antiplatelet therapy now for 4 days.  Options were discussed with the patient including continued  just bladder irrigation alone versus operative evaluation with cystoscopy, clot evacuation, and fulguration, he wished to proceed with the latter.  Informed consent was obtained and placed in medical record.  PROCEDURE IN DETAIL:  The patient being Timothy [name] and procedure being cysto, clot evacuation was confirmed.  Procedure was carried out. Time-out was performed.  Intravenous antibiotics were administered. General LMA anesthesia was introduced.  The patient was placed into a low lithotomy position.  Sterile field was created by prepping and draping the patient's penis, perineum, and proximal thighs using iodine x3.  Next, cystourethroscopy was performed using a 26-French resectoscope sheath with obturator.  Inspection of the anterior and posterior urethra was relatively unremarkable.  There was mild bilobar prostatic hypertrophy with scant oozing at the bladder neck, consistent with likely catheter irritation.  Inspection of the bladder revealed a moderate volume of old appearing formed clot.  This was irrigated with a Toomey syringe.  Approximately 150 mL of old clot was irrigated and set aside for discard.  Inspection of urinary bladder following clot removal revealed some mild erythema and some scant active oozing from prior biopsy sites.  The right ureteral orifice was unremarkable.  The left ureteral orifice was very near to an old resection site.  Attention was then directed to fulguration of bleeders.  The rollerball type electrode was used and bipolar fulguration current was applied to the edges of the prior biopsy site, any oozing, erythema throughout the urinary bladder, resulted in excellent hemostasis.  Great care was taken to avoid any injury to the ureteral orifices, which did not occur.  Additional current was applied to the somewhat erythematous irritated areas, area of the bladder neck, resulted in excellent hemostasis.  Attention was then directed to  retrograde pyelograms.  The right ureteral orifice was cannulated with a 6-French end-hole catheter and right retrograde pyelogram was obtained.  Right retrograde pyelogram demonstrated a single right ureter, single system right kidney.  No filling defects or narrowing noted.  Similarly, left retrograde pyelogram was obtained.  Left retrograde pyelogram demonstrated a single left ureter, single system left kidney.  No filling defects or hydronephrosis noted.  The cystoscope was exchanged for a new 24-French 3-way Foley catheter, 10 mL sterile water in the balloon.  This irrigated quantitatively and completely clear; therefore, the irrigation port was plugged, attached to straight drain.  Procedure was terminated.  The patient tolerated the procedure well.  There were no immediate periprocedural complications. The patient was taken to the postanesthesia care unit in stable condition.          ______________________________ [name], MD     TM/MEDQ  D:  05/31/2015  T:  05/31/2015  Job:  [id number]

## 2015-06-04 NOTE — Discharge Summary (Signed)
Patient ID: Timothy [name] MRN: [id number] DOB/AGE: [birth year] 69 y.o.  Admit date: 05/28/2015 Discharge date: 06/04/2015  Primary Care Physician:  Glo Herring., MD  Discharge Diagnoses:   Present on Admission:  . Hematuria  Clot retention  Consults:  None     Discharge Medications:   Medication List    STOP taking these medications        cephALEXin 500 MG capsule  Commonly known as:  KEFLEX     clopidogrel 75 MG tablet  Commonly known as:  PLAVIX      TAKE these medications        ALEVE 220 MG tablet  Generic drug:  naproxen sodium  Take 220-440 mg by mouth every 8 (eight) hours as needed (For pain.).     aspirin 81 MG chewable tablet  Chew 1 tablet (81 mg total) by mouth daily.     glimepiride 2 MG tablet  Commonly known as:  AMARYL  Take 2 mg by mouth daily.     HYDROcodone-acetaminophen 5-325 MG per tablet  Commonly known as:  NORCO  Take 1-2 tablets by mouth every 4 (four) hours as needed for moderate pain.     losartan 50 MG tablet  Commonly known as:  COZAAR  Take 1 tablet (50 mg total) by mouth daily.     metFORMIN 500 MG tablet  Commonly known as:  GLUCOPHAGE  Take 1 tablet (500 mg total) by mouth 2 (two) times daily with a meal.     metoprolol succinate 25 MG 24 hr tablet  Commonly known as:  TOPROL-XL  Take 1 tablet (25 mg total) by mouth daily.     nitroGLYCERIN 0.4 MG SL tablet  Commonly known as:  NITROSTAT  Place 1 tablet (0.4 mg total) under the tongue every 5 (five) minutes x 3 doses as needed for chest pain.     SYSTANE OP  Place 1-2 drops into both eyes daily as needed (For dry eyes.).         Significant Diagnostic Studies:  No results found.  Brief H and P: For complete details please refer to admission H and P, but in brief the patient was admitted for management of gross hematuria with retention, about a month following cystoscopy and bladder biopsy to rule out recurrence of non-muscle invasive bladder cancer. The  patient was seen in my office in [place] 3 days prior to the admission. At that time, his urinalysis was clear. He was on Plavix, which more than likely exacerbated any small amount of gross hematuria.  Hospital Course: The patient was admitted to the urology service, had catheter placement and bladder irrigation. His Plavix have been stopped. Unfortunately, the bleeding continued and he needed eventual cystoscopy and cauterization of the bladder on 05/31/2015. Following this, the patient's urine was clear. CBI was stopped and the patient was discharged the following day on 06/01/2015. Active Problems:   Gross hematuria   Hematuria   Day of Discharge BP 137/81 mmHg  Pulse 68  Temp(Src) 98.3 F (36.8 C) (Oral)  Resp 16  Ht 5\' 8"  (1.727 m)  Wt 196 lb 13.9 oz (89.3 kg)  BMI 29.94 kg/m2  SpO2 100%  No results found for this or any previous visit (from the past 24 hour(s)).  Physical Exam: General: Alert and awake oriented x3 not in any acute distress. HEENT: anicteric sclera, pupils reactive to light and accommodation CVS: S1-S2 clear no murmur rubs or gallops Chest: clear to auscultation bilaterally, no wheezing rales or  rhonchi Abdomen: soft nontender, nondistended, normal bowel sounds, no organomegaly Extremities: no cyanosis, clubbing or edema noted bilaterally Neuro: Cranial nerves II-XII intact, no focal neurological deficits  Disposition:  Home  Diet:  No restrictions  Activity:  Gradually increase   Disposition and Follow-up:     Discharge Instructions    Discharge patient    Complete by:  As directed             He will follow-up in our [place] office  TESTS THAT NEED FOLLOW-UP  15 minutes  DISCHARGE FOLLOW-UP Follow-up Information    Follow up with Jorja Loa, MD.   Specialty:  Urology   Why:  as scheduled   Contact information:   [place] [id number] [phone]       Time spent on Discharge:  15  mins  Signed: Jorja Loa 06/04/2015, 10:34 AM

## 2015-06-16 ENCOUNTER — Ambulatory Visit (INDEPENDENT_AMBULATORY_CARE_PROVIDER_SITE_OTHER): Payer: Medicare Other | Admitting: Urology

## 2015-06-16 DIAGNOSIS — C61 Malignant neoplasm of prostate: Secondary | ICD-10-CM | POA: Diagnosis not present

## 2015-06-16 DIAGNOSIS — C674 Malignant neoplasm of posterior wall of bladder: Secondary | ICD-10-CM

## 2015-06-16 DIAGNOSIS — C679 Malignant neoplasm of bladder, unspecified: Secondary | ICD-10-CM | POA: Diagnosis not present

## 2015-06-16 DIAGNOSIS — N390 Urinary tract infection, site not specified: Secondary | ICD-10-CM | POA: Diagnosis not present

## 2015-06-23 ENCOUNTER — Ambulatory Visit: Payer: Medicare Other | Admitting: Urology

## 2015-06-23 DIAGNOSIS — N390 Urinary tract infection, site not specified: Secondary | ICD-10-CM | POA: Diagnosis not present

## 2015-06-23 DIAGNOSIS — C61 Malignant neoplasm of prostate: Secondary | ICD-10-CM | POA: Diagnosis not present

## 2015-06-24 DIAGNOSIS — Z1389 Encounter for screening for other disorder: Secondary | ICD-10-CM | POA: Diagnosis not present

## 2015-06-24 DIAGNOSIS — R0989 Other specified symptoms and signs involving the circulatory and respiratory systems: Secondary | ICD-10-CM | POA: Diagnosis not present

## 2015-06-24 DIAGNOSIS — E782 Mixed hyperlipidemia: Secondary | ICD-10-CM | POA: Diagnosis not present

## 2015-06-24 DIAGNOSIS — E119 Type 2 diabetes mellitus without complications: Secondary | ICD-10-CM | POA: Diagnosis not present

## 2015-06-24 DIAGNOSIS — I10 Essential (primary) hypertension: Secondary | ICD-10-CM | POA: Diagnosis not present

## 2015-06-24 DIAGNOSIS — I214 Non-ST elevation (NSTEMI) myocardial infarction: Secondary | ICD-10-CM | POA: Diagnosis not present

## 2015-06-24 DIAGNOSIS — Z6830 Body mass index (BMI) 30.0-30.9, adult: Secondary | ICD-10-CM | POA: Diagnosis not present

## 2015-06-29 DIAGNOSIS — E6609 Other obesity due to excess calories: Secondary | ICD-10-CM | POA: Diagnosis not present

## 2015-06-29 DIAGNOSIS — E119 Type 2 diabetes mellitus without complications: Secondary | ICD-10-CM | POA: Diagnosis not present

## 2015-06-29 DIAGNOSIS — Z6830 Body mass index (BMI) 30.0-30.9, adult: Secondary | ICD-10-CM | POA: Diagnosis not present

## 2015-06-29 DIAGNOSIS — Z1389 Encounter for screening for other disorder: Secondary | ICD-10-CM | POA: Diagnosis not present

## 2015-06-30 ENCOUNTER — Ambulatory Visit (INDEPENDENT_AMBULATORY_CARE_PROVIDER_SITE_OTHER): Payer: Medicare Other | Admitting: Urology

## 2015-06-30 DIAGNOSIS — C61 Malignant neoplasm of prostate: Secondary | ICD-10-CM

## 2015-06-30 DIAGNOSIS — C674 Malignant neoplasm of posterior wall of bladder: Secondary | ICD-10-CM | POA: Diagnosis not present

## 2015-06-30 DIAGNOSIS — N400 Enlarged prostate without lower urinary tract symptoms: Secondary | ICD-10-CM | POA: Diagnosis not present

## 2015-07-06 ENCOUNTER — Other Ambulatory Visit: Payer: Self-pay | Admitting: Physician Assistant

## 2015-07-06 NOTE — Telephone Encounter (Signed)
Please review for refill. Thanks!  

## 2015-07-07 DIAGNOSIS — Z5111 Encounter for antineoplastic chemotherapy: Secondary | ICD-10-CM | POA: Diagnosis not present

## 2015-07-07 DIAGNOSIS — C679 Malignant neoplasm of bladder, unspecified: Secondary | ICD-10-CM | POA: Diagnosis not present

## 2015-07-10 ENCOUNTER — Other Ambulatory Visit: Payer: Self-pay | Admitting: Physician Assistant

## 2015-07-10 NOTE — Telephone Encounter (Signed)
Please review for refill. Thanks!  

## 2015-07-14 ENCOUNTER — Telehealth: Payer: Self-pay | Admitting: *Deleted

## 2015-07-14 MED ORDER — FISH OIL 1000 MG PO CAPS: 4.0000 | ORAL_CAPSULE | Freq: Every day | ORAL | Status: DC

## 2015-07-14 NOTE — Telephone Encounter (Signed)
Patient notified of changes. Patient voiced that he understood.

## 2015-07-14 NOTE — Telephone Encounter (Signed)
-----   Message from Lendon Colonel, NP sent at 07/14/2015 10:19 AM EDT ----- Regarding: Cholesterol status Patient to begin Fish Oil 4 gms daily. He is intolerant to statins due to myalgia.   Thank You!

## 2015-07-14 NOTE — Telephone Encounter (Signed)
Patient notified

## 2015-07-28 DIAGNOSIS — E669 Obesity, unspecified: Secondary | ICD-10-CM | POA: Diagnosis not present

## 2015-07-28 DIAGNOSIS — J0100 Acute maxillary sinusitis, unspecified: Secondary | ICD-10-CM | POA: Diagnosis not present

## 2015-07-28 DIAGNOSIS — E1129 Type 2 diabetes mellitus with other diabetic kidney complication: Secondary | ICD-10-CM | POA: Diagnosis not present

## 2015-07-28 DIAGNOSIS — Z1389 Encounter for screening for other disorder: Secondary | ICD-10-CM | POA: Diagnosis not present

## 2015-07-28 DIAGNOSIS — E782 Mixed hyperlipidemia: Secondary | ICD-10-CM | POA: Diagnosis not present

## 2015-07-28 DIAGNOSIS — I10 Essential (primary) hypertension: Secondary | ICD-10-CM | POA: Diagnosis not present

## 2015-07-28 DIAGNOSIS — J0140 Acute pansinusitis, unspecified: Secondary | ICD-10-CM | POA: Diagnosis not present

## 2015-09-22 ENCOUNTER — Ambulatory Visit (INDEPENDENT_AMBULATORY_CARE_PROVIDER_SITE_OTHER): Payer: Medicare Other | Admitting: Urology

## 2015-09-22 DIAGNOSIS — C61 Malignant neoplasm of prostate: Secondary | ICD-10-CM | POA: Diagnosis not present

## 2015-09-22 DIAGNOSIS — C674 Malignant neoplasm of posterior wall of bladder: Secondary | ICD-10-CM | POA: Diagnosis not present

## 2015-10-13 ENCOUNTER — Ambulatory Visit (INDEPENDENT_AMBULATORY_CARE_PROVIDER_SITE_OTHER): Payer: Medicare Other | Admitting: Urology

## 2015-10-13 DIAGNOSIS — C674 Malignant neoplasm of posterior wall of bladder: Secondary | ICD-10-CM

## 2015-10-13 DIAGNOSIS — N390 Urinary tract infection, site not specified: Secondary | ICD-10-CM | POA: Diagnosis not present

## 2015-10-20 ENCOUNTER — Ambulatory Visit (INDEPENDENT_AMBULATORY_CARE_PROVIDER_SITE_OTHER): Payer: Medicare Other | Admitting: Urology

## 2015-10-20 DIAGNOSIS — C674 Malignant neoplasm of posterior wall of bladder: Secondary | ICD-10-CM | POA: Diagnosis not present

## 2015-10-20 DIAGNOSIS — C61 Malignant neoplasm of prostate: Secondary | ICD-10-CM | POA: Diagnosis not present

## 2015-10-23 ENCOUNTER — Encounter: Payer: Self-pay | Admitting: Adult Health

## 2015-10-23 ENCOUNTER — Ambulatory Visit (INDEPENDENT_AMBULATORY_CARE_PROVIDER_SITE_OTHER): Payer: Medicare Other | Admitting: Adult Health

## 2015-10-23 VITALS — BP 158/98 | HR 83 | Ht 68.0 in | Wt 208.0 lb

## 2015-10-23 DIAGNOSIS — I2510 Atherosclerotic heart disease of native coronary artery without angina pectoris: Secondary | ICD-10-CM | POA: Diagnosis not present

## 2015-10-23 DIAGNOSIS — I10 Essential (primary) hypertension: Secondary | ICD-10-CM

## 2015-10-23 NOTE — Patient Instructions (Signed)
Your physician recommends that you schedule a follow-up appointment in: 6 Months  Your physician has recommended you make the following change in your medication:   STOP PLAVIX AFTER FINISHING CURRENT PRESCRIPTION    YOU MAY COME IN ON Monday AFTERNOON AND HAVE YOUR FLU SHOT.  If you need a refill on your cardiac medications before your next appointment, please call your pharmacy.  Thank you for choosing [facility]!

## 2015-10-23 NOTE — Progress Notes (Signed)
Cardiology Office Note   Date:  10/23/2015   ID:  Timothy [name], DOB Jan 25, 1946, MRN [id number]  PCP:  Glo Herring., MD  Cardiologist: Cloria Spring, NP   Chief Complaint  Patient presents with  . Coronary Artery Disease      History of Present Illness: Timothy [name] is a 69 y.o. male who presents for ongoing assessment and management of multivessel CAD per cardiac cath in 02/06/3015, s/p successful PCI/DES to mid RCA. There was significant restenosis in the distal AV groove circumflex at the previous PTCA site. Still not amenable PCI with stent. At this time would treat this medically. Hypertension and hyperlipidemia.   He is without complaints. He is having trouble with salt restriction. He denies chest pain or dyspnea. He requests a flu shot.   Past Medical History  Diagnosis Date  . Diabetes mellitus, type 2 ([place])   . Hypertension   . Bladder cancer (Timothy [name])   . Elevated PSA   . CAD (coronary artery disease)     a. 06/2014: NSTEMI s/p PTCA of LCx, 60-80% residual in distal LAD, 70% prox small D1.   b. 02/06/15 NSTEMI  s/p successful PCI/DES to mid RCA  . HOH (hard of hearing)   . HLD (hyperlipidemia)     Past Surgical History  Procedure Laterality Date  . Transurethral resection of bladder tumor N/A 03/14/2013    Procedure: TRANSURETHRAL RESECTION OF BLADDER TUMOR (TURBT);  Surgeon: Timothy Gallo, MD;  Location: [facility];  Service: Urology;  Laterality: N/A;  1 HR WITH MITOMYCIN INSTILLATION   . Transurethral resection of bladder tumor N/A 09/30/2013    Procedure: TRANSURETHRAL RESECTION OF BLADDER TUMOR (TURBT);  Surgeon: Timothy Gallo, MD;  Location: [facility];  Service: Urology;  Laterality: N/A;  . Transurethral resection of bladder tumor N/A 03/31/2014    Procedure: TRANSURETHRAL RESECTION OF BLADDER TUMOR (TURBT);  Surgeon: Timothy Gallo, MD;  Location: [facility];  Service: Urology;   Laterality: N/A;  . Prostate biopsy N/A 03/31/2014    Procedure: BIOPSY TRANSRECTAL ULTRASONIC PROSTATE (TUBP);  Surgeon: Timothy Gallo, MD;  Location: [facility];  Service: Urology;  Laterality: N/A;  . Left heart catheterization with coronary angiogram N/A 06/17/2014    Procedure: LEFT HEART CATHETERIZATION WITH CORONARY ANGIOGRAM;  Surgeon: Timothy Man, MD;  Location: [facility] CATH LAB;  Service: Cardiovascular;  Laterality: N/A;  . Left heart catheterization with coronary angiogram N/A 02/06/2015    Procedure: LEFT HEART CATHETERIZATION WITH CORONARY ANGIOGRAM;  Surgeon: Timothy Man, MD;  Location: [facility] CATH LAB;  Service: Cardiovascular;  Laterality: N/A;  . Cardiac catheterization  02/06/2015    Procedure: CORONARY STENT INTERVENTION;  Surgeon: Timothy Man, MD;  Location: [facility] CATH LAB;  Service: Cardiovascular;;  Mid RCA  . Cystoscopy w/ retrogrades N/A 05/05/2015    Procedure: CYSTOSCOPY WITH BILATERAL RETROGRADE PYELOGRAMS AND BLADDER BIOPSIES;  Surgeon: Timothy Gallo, MD;  Location: [facility];  Service: Urology;  Laterality: N/A;  . Transurethral resection of bladder tumor N/A 05/31/2015    Procedure: Cystoscopy with clott evacuation and fulgeration of bleeders, retrograde pylegram;  Surgeon: Timothy Frock, MD;  Location: [facility];  Service: Urology;  Laterality: N/A;     Current Outpatient Prescriptions  Medication Sig Dispense Refill  . aspirin 81 MG chewable tablet Chew 1 tablet (81 mg total) by mouth daily.    . clopidogrel (PLAVIX) 75 MG tablet TAKE 1 TABLET EVERY DAY WITH BREAKFAST 30 tablet 11  .  glimepiride (AMARYL) 2 MG tablet Take 2 mg by mouth daily.     Marland Kitchen losartan (COZAAR) 50 MG tablet TAKE 1 TABLET EVERY DAY 30 tablet 12  . metFORMIN (GLUCOPHAGE) 500 MG tablet Take 1 tablet (500 mg total) by mouth 2 (two) times daily with a meal. 60 tablet 11  . metoprolol succinate (TOPROL-XL) 25 MG 24 hr tablet TAKE 1 TABLET EVERY DAY 30 tablet 12  . naproxen sodium  (ALEVE) 220 MG tablet Take 220-440 mg by mouth every 8 (eight) hours as needed (For pain.).    Marland Kitchen nitroGLYCERIN (NITROSTAT) 0.4 MG SL tablet Place 1 tablet (0.4 mg total) under the tongue every 5 (five) minutes x 3 doses as needed for chest pain. 25 tablet 12  . Omega-3 Fatty Acids (FISH OIL) 1000 MG CAPS Take 4 capsules (4,000 mg total) by mouth daily.  0  . Polyethyl Glycol-Propyl Glycol (SYSTANE OP) Place 1-2 drops into both eyes daily as needed (For dry eyes.).     No current facility-administered medications for this visit.    Allergies:   Lipitor; Other; and Pravastatin    Social History:  The patient  reports that he quit smoking about 22 years ago. His smoking use included Cigarettes. He has a 30 pack-year smoking history. He has never used smokeless tobacco. He reports that he does not drink alcohol or use illicit drugs.   Family History:  The patient's family history is not on file.    ROS: All other systems are reviewed and negative. Unless otherwise mentioned in H&P    PHYSICAL EXAM: VS:  BP 158/98 mmHg  Pulse 83  Ht 5\' 8"  (1.727 m)  Wt 208 lb (94.348 kg)  BMI 31.63 kg/m2  SpO2 95% , BMI Body mass index is 31.63 kg/(m^2). GEN: Well nourished, well developed, in no acute distress HEENT: normal Neck: no JVD, carotid bruits, or masses Cardiac:RRR; no murmurs, rubs, or gallops,no edema  Respiratory:  clear to auscultation bilaterally, normal work of breathing GI: soft, nontender, nondistended, + BS. Obese MS: no deformity or atrophy Skin: warm and dry, no rash Neuro:  Strength and sensation are intact Psych: euthymic mood, full affect   Recent Labs: 02/06/2015: ALT 37; Magnesium 2.2; TSH 1.805 06/01/2015: BUN 16; Creatinine, Ser 1.42*; Hemoglobin 12.1*; Platelets 213; Potassium 4.5; Sodium 137    Lipid Panel    Component Value Date/Time   CHOL 222* 10/13/2014 0957   TRIG 201* 10/13/2014 0957   HDL 33* 10/13/2014 0957   CHOLHDL 6.7 10/13/2014 0957   VLDL 40  10/13/2014 0957   LDLCALC 149* 10/13/2014 0957      Wt Readings from Last 3 Encounters:  10/23/15 208 lb (94.348 kg)  05/28/15 196 lb 13.9 oz (89.3 kg)  05/05/15 202 lb (91.627 kg)       ASSESSMENT AND PLAN:  1. CAD: stents to RCA and CX. He is requesting to stop Plavix as he has been on it for almost two years. I will allow him to stop the Plavix at the end of December. He will need to take ASA daily. He will continue on BB and ARB. He is intolerant to statins.   2. Hypertension; Not well controlled for diabetic patient. He is having trouble with salt intake. I have advised him that if BP remains elevated on next office visit, will need to increase losartan. I have asked him to lose wt by walking 30 minutes daily. He has a treadmill and states he will begin using it.  Current medicines are reviewed at length with the patient today.    Labs/ tests ordered today include: BMET No orders of the defined types were placed in this encounter.     Disposition:   FU with 6 months.   Signed, Jory Sims, NP  10/23/2015 2:39 PM    [facility] [address], [place], [place] [address] Phone: [phone]; Fax: [phone]

## 2015-10-23 NOTE — Addendum Note (Signed)
Addended by: Levonne Hubert on: 10/23/2015 03:33 PM   Modules accepted: Orders, Medications

## 2015-10-23 NOTE — Progress Notes (Signed)
Name: Timothy [name]    DOB: 09-29-1946  Age: 69 y.o.  MR#: [id number]       PCP:  Glo Herring., MD      Insurance: Payor: [employer] MEDICARE / Plan: [facility] MEDICARE / Product Type: *No Product type* /   CC:   No chief complaint on file.   VS Filed Vitals:   10/23/15 1359  BP: 158/98  Pulse: 83  Height: 5\' 8"  (1.727 m)  Weight: 208 lb (94.348 kg)  SpO2: 95%    Weights Current Weight  10/23/15 208 lb (94.348 kg)  05/28/15 196 lb 13.9 oz (89.3 kg)  05/05/15 202 lb (91.627 kg)    Blood Pressure  BP Readings from Last 3 Encounters:  10/23/15 158/98  06/01/15 137/81  05/05/15 134/76     Admit date:  (Not on file) Last encounter with RMR:  05/12/2015   Allergy Lipitor; Other; and Pravastatin  Current Outpatient Prescriptions  Medication Sig Dispense Refill  . aspirin 81 MG chewable tablet Chew 1 tablet (81 mg total) by mouth daily.    . clopidogrel (PLAVIX) 75 MG tablet TAKE 1 TABLET EVERY DAY WITH BREAKFAST 30 tablet 11  . glimepiride (AMARYL) 2 MG tablet Take 2 mg by mouth daily.     Marland Kitchen losartan (COZAAR) 50 MG tablet TAKE 1 TABLET EVERY DAY 30 tablet 12  . metFORMIN (GLUCOPHAGE) 500 MG tablet Take 1 tablet (500 mg total) by mouth 2 (two) times daily with a meal. 60 tablet 11  . metoprolol succinate (TOPROL-XL) 25 MG 24 hr tablet TAKE 1 TABLET EVERY DAY 30 tablet 12  . naproxen sodium (ALEVE) 220 MG tablet Take 220-440 mg by mouth every 8 (eight) hours as needed (For pain.).    Marland Kitchen nitroGLYCERIN (NITROSTAT) 0.4 MG SL tablet Place 1 tablet (0.4 mg total) under the tongue every 5 (five) minutes x 3 doses as needed for chest pain. 25 tablet 12  . Omega-3 Fatty Acids (FISH OIL) 1000 MG CAPS Take 4 capsules (4,000 mg total) by mouth daily.  0  . Polyethyl Glycol-Propyl Glycol (SYSTANE OP) Place 1-2 drops into both eyes daily as needed (For dry eyes.).     No current facility-administered medications for this visit.    Discontinued Meds:    Medications Discontinued  During This Encounter  Medication Reason  . HYDROcodone-acetaminophen (NORCO) 5-325 MG per tablet Error    Patient Active Problem List   Diagnosis Date Noted  . Hematuria 05/31/2015  . Gross hematuria 05/28/2015  . HOH (hard of hearing)   . HLD (hyperlipidemia)   . CAD (coronary artery disease)   . Hypertension   . Diabetes mellitus, type 2 ([place])   . Bladder cancer ([place])   . NSTEMI (non-ST elevated myocardial infarction) ([place]) 06/17/2014    LABS    Component Value Date/Time   NA 137 06/01/2015 0423   NA 139 05/31/2015 0420   NA 137 05/30/2015 0430   K 4.5 06/01/2015 0423   K 4.2 05/31/2015 0420   K 4.1 05/30/2015 0430   CL 104 06/01/2015 0423   CL 106 05/31/2015 0420   CL 105 05/30/2015 0430   CO2 26 06/01/2015 0423   CO2 25 05/31/2015 0420   CO2 26 05/30/2015 0430   GLUCOSE 114* 06/01/2015 0423   GLUCOSE 136* 05/31/2015 0420   GLUCOSE 132* 05/30/2015 0430   BUN 16 06/01/2015 0423   BUN 13 05/31/2015 0420   BUN 13 05/30/2015 0430   CREATININE 1.42* 06/01/2015 0423  CREATININE 1.43* 05/31/2015 0420   CREATININE 1.27* 05/30/2015 0430   CALCIUM 8.6* 06/01/2015 0423   CALCIUM 8.8* 05/31/2015 0420   CALCIUM 8.4* 05/30/2015 0430   GFRNONAA 49* 06/01/2015 0423   GFRNONAA 49* 05/31/2015 0420   GFRNONAA 56* 05/30/2015 0430   GFRAA 57* 06/01/2015 0423   GFRAA 57* 05/31/2015 0420   GFRAA >60 05/30/2015 0430   CMP     Component Value Date/Time   NA 137 06/01/2015 0423   K 4.5 06/01/2015 0423   CL 104 06/01/2015 0423   CO2 26 06/01/2015 0423   GLUCOSE 114* 06/01/2015 0423   BUN 16 06/01/2015 0423   CREATININE 1.42* 06/01/2015 0423   CALCIUM 8.6* 06/01/2015 0423   PROT 6.5 02/06/2015 1338   ALBUMIN 3.5 02/06/2015 1338   AST 44* 02/06/2015 1338   ALT 37 02/06/2015 1338   ALKPHOS 102 02/06/2015 1338   BILITOT 0.9 02/06/2015 1338   GFRNONAA 49* 06/01/2015 0423   GFRAA 57* 06/01/2015 0423       Component Value Date/Time   WBC 8.6 06/01/2015 0423   WBC 7.8  05/31/2015 0420   WBC 7.4 05/30/2015 0430   HGB 12.1* 06/01/2015 0423   HGB 13.0 05/31/2015 0420   HGB 13.0 05/30/2015 0430   HCT 35.5* 06/01/2015 0423   HCT 38.1* 05/31/2015 0420   HCT 38.8* 05/30/2015 0430   MCV 90.3 06/01/2015 0423   MCV 89.6 05/31/2015 0420   MCV 89.8 05/30/2015 0430    Lipid Panel     Component Value Date/Time   CHOL 222* 10/13/2014 0957   TRIG 201* 10/13/2014 0957   HDL 33* 10/13/2014 0957   CHOLHDL 6.7 10/13/2014 0957   VLDL 40 10/13/2014 0957   LDLCALC 149* 10/13/2014 0957    ABG    Component Value Date/Time   TCO2 20 02/06/2015 0633     Lab Results  Component Value Date   TSH 1.805 02/06/2015   BNP (last 3 results) No results for input(s): BNP in the last 8760 hours.  ProBNP (last 3 results) No results for input(s): PROBNP in the last 8760 hours.  Cardiac Panel (last 3 results) No results for input(s): CKTOTAL, CKMB, TROPONINI, RELINDX in the last 72 hours.  Iron/TIBC/Ferritin/ %Sat No results found for: IRON, TIBC, FERRITIN, IRONPCTSAT   EKG Orders placed or performed during the hospital encounter of 02/06/15  . EKG 12-Lead  . EKG 12-Lead  . EKG 12-Lead immediately post procedure  . EKG 12-Lead  . EKG 12-Lead  . EKG 12-Lead  . EKG 12-Lead immediately post procedure  . EKG 12-Lead  . EKG 12-Lead  . EKG 12-Lead  . EKG  . EKG     Prior Assessment and Plan Problem List as of 10/23/2015      Cardiovascular and Mediastinum   NSTEMI (non-ST elevated myocardial infarction) [facility])   Last Assessment & Plan 07/04/2014 Office Visit Written 07/04/2014  1:35 PM by Lendon Colonel, NP    He is s/p PTCA of the of the Cx beyond the AV groove. He is without complaints, is medically compliant. He is already referred to cardiac rehab. I have explained his medications to him, and emphasized the importance of compliance. He verbalizes understanding. We will see him again in 3 months. Sooner if he is symptomatic. He is to report and bleeding.        Hypertension   Last Assessment & Plan 07/04/2014 Office Visit Written 07/04/2014  1:35 PM by Lendon Colonel, NP  He is well controlled currently. No changes in his medication regimen.      CAD (coronary artery disease)     Endocrine   Diabetes mellitus, type 2 [facility])   Last Assessment & Plan 07/04/2014 Office Visit Written 07/04/2014  1:36 PM by Lendon Colonel, NP    He is to adhere to diabetic diet.        Nervous and Auditory   HOH (hard of hearing)     Genitourinary   Bladder cancer (H[facility])   Gross hematuria     Other   HLD (hyperlipidemia)   Hematuria       Imaging: No results found.

## 2015-10-27 ENCOUNTER — Ambulatory Visit (INDEPENDENT_AMBULATORY_CARE_PROVIDER_SITE_OTHER): Payer: Medicare Other | Admitting: Urology

## 2015-10-27 DIAGNOSIS — C674 Malignant neoplasm of posterior wall of bladder: Secondary | ICD-10-CM

## 2015-10-27 DIAGNOSIS — N390 Urinary tract infection, site not specified: Secondary | ICD-10-CM | POA: Diagnosis not present

## 2015-11-03 ENCOUNTER — Ambulatory Visit (INDEPENDENT_AMBULATORY_CARE_PROVIDER_SITE_OTHER): Payer: Medicare Other | Admitting: Urology

## 2015-11-03 DIAGNOSIS — C674 Malignant neoplasm of posterior wall of bladder: Secondary | ICD-10-CM

## 2015-11-23 DIAGNOSIS — Z23 Encounter for immunization: Secondary | ICD-10-CM | POA: Diagnosis not present

## 2015-11-23 DIAGNOSIS — E782 Mixed hyperlipidemia: Secondary | ICD-10-CM | POA: Diagnosis not present

## 2015-11-23 DIAGNOSIS — N390 Urinary tract infection, site not specified: Secondary | ICD-10-CM | POA: Diagnosis not present

## 2015-11-23 DIAGNOSIS — Z1389 Encounter for screening for other disorder: Secondary | ICD-10-CM | POA: Diagnosis not present

## 2015-11-23 DIAGNOSIS — E6609 Other obesity due to excess calories: Secondary | ICD-10-CM | POA: Diagnosis not present

## 2015-11-23 DIAGNOSIS — I10 Essential (primary) hypertension: Secondary | ICD-10-CM | POA: Diagnosis not present

## 2015-11-23 DIAGNOSIS — N51 Disorders of male genital organs in diseases classified elsewhere: Secondary | ICD-10-CM | POA: Diagnosis not present

## 2015-11-23 DIAGNOSIS — E1129 Type 2 diabetes mellitus with other diabetic kidney complication: Secondary | ICD-10-CM | POA: Diagnosis not present

## 2015-11-23 DIAGNOSIS — Z6832 Body mass index (BMI) 32.0-32.9, adult: Secondary | ICD-10-CM | POA: Diagnosis not present

## 2015-12-08 DIAGNOSIS — H6092 Unspecified otitis externa, left ear: Secondary | ICD-10-CM | POA: Diagnosis not present

## 2015-12-08 DIAGNOSIS — H6123 Impacted cerumen, bilateral: Secondary | ICD-10-CM | POA: Diagnosis not present

## 2015-12-08 DIAGNOSIS — H9212 Otorrhea, left ear: Secondary | ICD-10-CM | POA: Diagnosis not present

## 2016-01-15 DIAGNOSIS — R972 Elevated prostate specific antigen [PSA]: Secondary | ICD-10-CM | POA: Diagnosis not present

## 2016-01-15 DIAGNOSIS — N390 Urinary tract infection, site not specified: Secondary | ICD-10-CM | POA: Diagnosis not present

## 2016-01-15 DIAGNOSIS — I10 Essential (primary) hypertension: Secondary | ICD-10-CM | POA: Diagnosis not present

## 2016-01-15 DIAGNOSIS — Z1389 Encounter for screening for other disorder: Secondary | ICD-10-CM | POA: Diagnosis not present

## 2016-01-15 DIAGNOSIS — E1129 Type 2 diabetes mellitus with other diabetic kidney complication: Secondary | ICD-10-CM | POA: Diagnosis not present

## 2016-01-26 DIAGNOSIS — M25511 Pain in right shoulder: Secondary | ICD-10-CM | POA: Diagnosis not present

## 2016-01-26 DIAGNOSIS — Z1389 Encounter for screening for other disorder: Secondary | ICD-10-CM | POA: Diagnosis not present

## 2016-01-26 DIAGNOSIS — Z23 Encounter for immunization: Secondary | ICD-10-CM | POA: Diagnosis not present

## 2016-02-02 ENCOUNTER — Other Ambulatory Visit: Payer: Self-pay | Admitting: Urology

## 2016-02-02 ENCOUNTER — Ambulatory Visit (INDEPENDENT_AMBULATORY_CARE_PROVIDER_SITE_OTHER): Payer: Medicare Other | Admitting: Urology

## 2016-02-02 DIAGNOSIS — C674 Malignant neoplasm of posterior wall of bladder: Secondary | ICD-10-CM | POA: Diagnosis not present

## 2016-02-02 DIAGNOSIS — N433 Hydrocele, unspecified: Secondary | ICD-10-CM | POA: Diagnosis not present

## 2016-02-02 DIAGNOSIS — N432 Other hydrocele: Secondary | ICD-10-CM

## 2016-02-02 DIAGNOSIS — C61 Malignant neoplasm of prostate: Secondary | ICD-10-CM | POA: Diagnosis not present

## 2016-02-16 ENCOUNTER — Encounter (INDEPENDENT_AMBULATORY_CARE_PROVIDER_SITE_OTHER): Payer: Self-pay | Admitting: *Deleted

## 2016-02-17 ENCOUNTER — Telehealth (INDEPENDENT_AMBULATORY_CARE_PROVIDER_SITE_OTHER): Payer: Self-pay | Admitting: *Deleted

## 2016-02-17 ENCOUNTER — Encounter (INDEPENDENT_AMBULATORY_CARE_PROVIDER_SITE_OTHER): Payer: Self-pay | Admitting: Internal Medicine

## 2016-02-17 ENCOUNTER — Ambulatory Visit (INDEPENDENT_AMBULATORY_CARE_PROVIDER_SITE_OTHER): Payer: Medicare Other | Admitting: Internal Medicine

## 2016-02-17 VITALS — BP 146/100 | HR 65 | Temp 98.0°F | Ht 68.0 in | Wt 209.6 lb

## 2016-02-17 DIAGNOSIS — R140 Abdominal distension (gaseous): Secondary | ICD-10-CM | POA: Diagnosis not present

## 2016-02-17 MED ORDER — PANTOPRAZOLE SODIUM 40 MG PO TBEC: 40.0000 mg | DELAYED_RELEASE_TABLET | Freq: Every day | ORAL | Status: DC

## 2016-02-17 NOTE — Telephone Encounter (Signed)
.  Per Lelon Perla patient will need to have test.

## 2016-02-17 NOTE — Patient Instructions (Addendum)
H. Pylori today. OV in 6 weeks.  Rx for Protonix.

## 2016-02-17 NOTE — Progress Notes (Signed)
Subjective:    Patient ID: Timothy [name], male    DOB: September 07, 1946, 70 y.o.   MRN: [id number]  HPIReferred by Dr. Donovan Kail for abdominal pain. He c/o burning sensation after he eats.  He says he does not eat heavy Sometimes he will take an [employer] which helps. He has taken [employer] which helps also.  Symptoms all his life.  Years ago he underwent an EGD which was normal.  He does pass a lot of flatus.  Denies very little acid reflux.  Appetite is good. No weight loss.  Usually has a BM daily.      Review of Systems Past Medical History  Diagnosis Date  . Diabetes mellitus, type 2 ([place])   . Hypertension   . Bladder cancer ([place])   . Elevated PSA   . CAD (coronary artery disease)     a. 06/2014: NSTEMI s/p PTCA of LCx, 60-80% residual in distal LAD, 70% prox small D1.   b. 02/06/15 NSTEMI  s/p successful PCI/DES to mid RCA  . HOH (hard of hearing)   . HLD (hyperlipidemia)     Past Surgical History  Procedure Laterality Date  . Transurethral resection of bladder tumor N/A 03/14/2013    Procedure: TRANSURETHRAL RESECTION OF BLADDER TUMOR (TURBT);  Surgeon: [name], MD;  Location: [facility];  Service: Urology;  Laterality: N/A;  1 HR WITH MITOMYCIN INSTILLATION   . Transurethral resection of bladder tumor N/A 09/30/2013    Procedure: TRANSURETHRAL RESECTION OF BLADDER TUMOR (TURBT);  Surgeon: [name], MD;  Location: [facility];  Service: Urology;  Laterality: N/A;  . Transurethral resection of bladder tumor N/A 03/31/2014    Procedure: TRANSURETHRAL RESECTION OF BLADDER TUMOR (TURBT);  Surgeon: [name], MD;  Location: [facility];  Service: Urology;  Laterality: N/A;  . Prostate biopsy N/A 03/31/2014    Procedure: BIOPSY TRANSRECTAL ULTRASONIC PROSTATE (TUBP);  Surgeon: [name], MD;  Location: [facility];  Service: Urology;  Laterality: N/A;  . Left heart catheterization with  coronary angiogram N/A 06/17/2014    Procedure: LEFT HEART CATHETERIZATION WITH CORONARY ANGIOGRAM;  Surgeon: [name], MD;  Location: [facility] CATH LAB;  Service: Cardiovascular;  Laterality: N/A;  . Left heart catheterization with coronary angiogram N/A 02/06/2015    Procedure: LEFT HEART CATHETERIZATION WITH CORONARY ANGIOGRAM;  Surgeon: [name], MD;  Location: [facility] CATH LAB;  Service: Cardiovascular;  Laterality: N/A;  . Cardiac catheterization  02/06/2015    Procedure: CORONARY STENT INTERVENTION;  Surgeon: [name], MD;  Location: [facility] CATH LAB;  Service: Cardiovascular;;  Mid RCA  . Cystoscopy w/ retrogrades N/A 05/05/2015    Procedure: CYSTOSCOPY WITH BILATERAL RETROGRADE PYELOGRAMS AND BLADDER BIOPSIES;  Surgeon: [name], MD;  Location: [facility];  Service: Urology;  Laterality: N/A;  . Transurethral resection of bladder tumor N/A 05/31/2015    Procedure: Cystoscopy with clott evacuation and fulgeration of bleeders, retrograde pylegram;  Surgeon: [name], MD;  Location[facility];  Service: Urology;  Laterality: N/A;    Allergies  Allergen Reactions  . Lipitor [Atorvastatin] Other (See Comments)    Myalgia  . Other Other (See Comments)    All Cholesterol Meds - "Don't agree with me"  . Pravastatin Other (See Comments)    Myalgia    Current Outpatient Prescriptions on File Prior to Visit  Medication Sig Dispense Refill  . aspirin 81 MG chewable tablet Chew 1 tablet (81 mg total) by mouth  daily.    . glimepiride (AMARYL) 2 MG tablet Take 2 mg by mouth daily.     . metFORMIN (GLUCOPHAGE) 500 MG tablet Take 1 tablet (500 mg total) by mouth 2 (two) times daily with a meal. 60 tablet 11  . metoprolol succinate (TOPROL-XL) 25 MG 24 hr tablet TAKE 1 TABLET EVERY DAY 30 tablet 12  . nitroGLYCERIN (NITROSTAT) 0.4 MG SL tablet Place 1 tablet (0.4 mg total) under the tongue every 5 (five) minutes x 3 doses as needed for chest pain. 25 tablet 12  . Omega-3 Fatty Acids (FISH  OIL) 1000 MG CAPS Take 4 capsules (4,000 mg total) by mouth daily.  0  . Polyethyl Glycol-Propyl Glycol (SYSTANE OP) Place 1-2 drops into both eyes daily as needed (For dry eyes.).     No current facility-administered medications on file prior to visit.        Objective:   Physical ExamBlood pressure 146/100, pulse 65, temperature 98 F (36.7 C), height 5\' 8"  (1.727 m), weight 209 lb 9.6 oz (95.074 kg). Alert and oriented. Skin warm and dry. Oral mucosa is moist.   . Sclera anicteric, conjunctivae is pink. Thyroid not enlarged. No cervical lymphadenopathy. Lungs clear. Heart regular rate and rhythm.  Abdomen is soft. Bowel sounds are positive. No hepatomegaly. No abdominal masses felt. No tenderness.  No edema to lower extremities.          Assessment & Plan:  GERD, bloating. Am going to try him on Protonix and bring him back in 6 weeks and see how she is doing.  He needs to diet and exercise H.pylori.  Rx for Protonix sent to his pharmacy.

## 2016-02-18 LAB — H. PYLORI BREATH TEST: H. PYLORI BREATH TEST: NOT DETECTED

## 2016-03-30 ENCOUNTER — Ambulatory Visit (INDEPENDENT_AMBULATORY_CARE_PROVIDER_SITE_OTHER): Payer: Medicare Other | Admitting: Internal Medicine

## 2016-03-30 ENCOUNTER — Encounter (INDEPENDENT_AMBULATORY_CARE_PROVIDER_SITE_OTHER): Payer: Self-pay | Admitting: Internal Medicine

## 2016-03-30 VITALS — BP 146/86 | HR 56 | Temp 97.0°F | Ht 68.0 in | Wt 209.6 lb

## 2016-03-30 DIAGNOSIS — R1013 Epigastric pain: Secondary | ICD-10-CM | POA: Diagnosis not present

## 2016-03-30 NOTE — Patient Instructions (Signed)
OV in 6 months. 

## 2016-03-30 NOTE — Progress Notes (Signed)
Subjective:    Patient ID: Timothy [name], male    DOB: 03/09/1946, 70 y.o.   MRN: [id number]  HPI Here today for f/u. He was last seen in March. He was having upper abdominal pain and I started him on Protonix. He stopped the Protonix after 3 days. He felt worse. His H. Pylori was negative when I checked it.  He says he feels good. He denies having any abdominal pain. He says he can eat just about anything he wants.  His appetite is good. No weight loss. He has a BM daily and sometimes twice a day. He is exercising using the tread mill 20-30 minutes a day and also walks up and down the hall. He is mowing the yard.         Review of Systems Past Medical History  Diagnosis Date  . Diabetes mellitus, type 2 ([place])   . Hypertension   . Bladder cancer ([place])   . Elevated PSA   . CAD (coronary artery disease)     a. 06/2014: NSTEMI s/p PTCA of LCx, 60-80% residual in distal LAD, 70% prox small D1.   b. 02/06/15 NSTEMI  s/p successful PCI/DES to mid RCA  . HOH (hard of hearing)   . HLD (hyperlipidemia)     Past Surgical History  Procedure Laterality Date  . Transurethral resection of bladder tumor N/A 03/14/2013    Procedure: TRANSURETHRAL RESECTION OF BLADDER TUMOR (TURBT);  Surgeon: [name], MD;  Location: [facility];  Service: Urology;  Laterality: N/A;  1 HR WITH MITOMYCIN INSTILLATION   . Transurethral resection of bladder tumor N/A 09/30/2013    Procedure: TRANSURETHRAL RESECTION OF BLADDER TUMOR (TURBT);  Surgeon: [name], MD;  Location: [facility];  Service: Urology;  Laterality: N/A;  . Transurethral resection of bladder tumor N/A 03/31/2014    Procedure: TRANSURETHRAL RESECTION OF BLADDER TUMOR (TURBT);  Surgeon: [name], MD;  Location: [facility];  Service: Urology;  Laterality: N/A;  . Prostate biopsy N/A 03/31/2014    Procedure: BIOPSY TRANSRECTAL ULTRASONIC PROSTATE (TUBP);  Surgeon: [name], MD;  Location: [facility];  Service: Urology;  Laterality: N/A;  . Left heart catheterization with coronary angiogram N/A 06/17/2014    Procedure: LEFT HEART CATHETERIZATION WITH CORONARY ANGIOGRAM;  Surgeon: [name], MD;  Location: [facility] CATH LAB;  Service: Cardiovascular;  Laterality: N/A;  . Left heart catheterization with coronary angiogram N/A 02/06/2015    Procedure: LEFT HEART CATHETERIZATION WITH CORONARY ANGIOGRAM;  Surgeon: [name], MD;  Location: [facility] CATH LAB;  Service: Cardiovascular;  Laterality: N/A;  . Cardiac catheterization  02/06/2015    Procedure: CORONARY STENT INTERVENTION;  Surgeon: [name], MD;  Location: [facility] CATH LAB;  Service: Cardiovascular;;  Mid RCA  . Cystoscopy w/ retrogrades N/A 05/05/2015    Procedure: CYSTOSCOPY WITH BILATERAL RETROGRADE PYELOGRAMS AND BLADDER BIOPSIES;  Surgeon: [name], MD;  Location: [facility];  Service: Urology;  Laterality: N/A;  . Transurethral resection of bladder tumor N/A 05/31/2015    Procedure: Cystoscopy with clott evacuation and fulgeration of bleeders, retrograde pylegram;  Surgeon: [name], MD;  Location: [facility];  Service: Urology;  Laterality: N/A;    Allergies  Allergen Reactions  . Lipitor [Atorvastatin] Other (See Comments)    Myalgia  . Other Other (See Comments)    All Cholesterol Meds - "Don't agree with me"  . Pravastatin Other (See Comments)    Myalgia  Current Outpatient Prescriptions on File Prior to Visit  Medication Sig Dispense Refill  . aspirin 81 MG chewable tablet Chew 1 tablet (81 mg total) by mouth daily.    Marland Kitchen glimepiride (AMARYL) 2 MG tablet Take 2 mg by mouth daily.     Marland Kitchen lisinopril (PRINIVIL,ZESTRIL) 10 MG tablet Take 10 mg by mouth daily.    . metFORMIN (GLUCOPHAGE) 500 MG tablet Take 1 tablet (500 mg total) by mouth 2 (two) times daily with a meal. 60 tablet 11  . metoprolol succinate (TOPROL-XL) 25 MG 24 hr tablet TAKE 1 TABLET EVERY DAY 30  tablet 12  . nitroGLYCERIN (NITROSTAT) 0.4 MG SL tablet Place 1 tablet (0.4 mg total) under the tongue every 5 (five) minutes x 3 doses as needed for chest pain. 25 tablet 12  . Omega-3 Fatty Acids (FISH OIL) 1000 MG CAPS Take 4 capsules (4,000 mg total) by mouth daily.  0  . Polyethyl Glycol-Propyl Glycol (SYSTANE OP) Place 1-2 drops into both eyes daily as needed (For dry eyes.).    Marland Kitchen pantoprazole (PROTONIX) 40 MG tablet Take 1 tablet (40 mg total) by mouth daily. (Patient not taking: Reported on 03/30/2016) 30 tablet 2   No current facility-administered medications on file prior to visit.        Objective:   Physical Exam Blood pressure 146/86, pulse 56, temperature 97 F (36.1 C), height 5\' 8"  (1.727 m), weight 209 lb 9.6 oz (95.074 kg). Alert and oriented. Skin warm and dry. Oral mucosa is moist.   . Sclera anicteric, conjunctivae is pink. Thyroid not enlarged. No cervical lymphadenopathy. Lungs clear. Heart regular rate and rhythm.  Abdomen is soft. Bowel sounds are positive. No hepatomegaly. No abdominal masses felt. No tenderness.  No edema to lower extremities.          Assessment & Plan:  Abdominal pain which has now resolved. There is no weight loss. He feels 100% better. OV in 6 months

## 2016-04-21 ENCOUNTER — Encounter: Payer: Self-pay | Admitting: Adult Health

## 2016-04-21 ENCOUNTER — Ambulatory Visit (INDEPENDENT_AMBULATORY_CARE_PROVIDER_SITE_OTHER): Payer: Medicare Other | Admitting: Adult Health

## 2016-04-21 VITALS — BP 124/80 | HR 69 | Ht 68.0 in | Wt 211.0 lb

## 2016-04-21 DIAGNOSIS — I10 Essential (primary) hypertension: Secondary | ICD-10-CM

## 2016-04-21 NOTE — Patient Instructions (Signed)
Your physician wants you to follow-up in: 1 Year with Dr. Branch. You will receive a reminder letter in the mail two months in advance. If you don't receive a letter, please call our office to schedule the follow-up appointment.   Your physician recommends that you continue on your current medications as directed. Please refer to the Current Medication list given to you today.  If you need a refill on your cardiac medications before your next appointment, please call your pharmacy.  Thank you for choosing [facility]!   

## 2016-04-21 NOTE — Progress Notes (Signed)
Cardiology Office Note   Date:  04/21/2016   ID:  Timothy [name], DOB [birth year], MRN [id number]  PCP:  Glo Herring., MD  Cardiologist:Branch/   Jory Sims, NP   No chief complaint on file.     History of Present Illness: Timothy [name] is a 70 y.o. male who presents for ongoing assessment and management of multivessel CAD, status post successful PCI/DES to the mid RCA. There was also significant stenosis of the distal AV groove circumflex had a previous PTCA site but was not amenable to PCI. He is treated medically. He was last seen in the office in November 2016 and was without complaint.he is found to be hypertensive, but admitted he was noncompliant with salt. If blood pressure is elevated on this office visit we will need to increase losartan  He is here today without any complaints. He is being followed closely by her primary care for his diabetes. He also states a nurse comes to see him once a year from [place] to review his medications and give him a physical. He denies any symptoms. Blood sugars were well-controlled.  Past Medical History  Diagnosis Date  . Diabetes mellitus, type 2 ([place])   . Hypertension   . Bladder cancer ([place])   . Elevated PSA   . CAD (coronary artery disease)     a. 06/2014: NSTEMI s/p PTCA of LCx, 60-80% residual in distal LAD, 70% prox small D1.   b. 02/06/15 NSTEMI  s/p successful PCI/DES to mid RCA  . HOH (hard of hearing)   . HLD (hyperlipidemia)     Past Surgical History  Procedure Laterality Date  . Transurethral resection of bladder tumor N/A 03/14/2013    Procedure: TRANSURETHRAL RESECTION OF BLADDER TUMOR (TURBT);  Surgeon: [name], MD;  Location: [facility];  Service: Urology;  Laterality: N/A;  1 HR WITH MITOMYCIN INSTILLATION   . Transurethral resection of bladder tumor N/A 09/30/2013    Procedure: TRANSURETHRAL RESECTION OF BLADDER TUMOR (TURBT);  Surgeon: [name], MD;  Location: [facility];  Service: Urology;  Laterality: N/A;  . Transurethral resection of bladder tumor N/A 03/31/2014    Procedure: TRANSURETHRAL RESECTION OF BLADDER TUMOR (TURBT);  Surgeon: [name], MD;  Location: [facility];  Service: Urology;  Laterality: N/A;  . Prostate biopsy N/A 03/31/2014    Procedure: BIOPSY TRANSRECTAL ULTRASONIC PROSTATE (TUBP);  Surgeon: [name], MD;  Location: [facility];  Service: Urology;  Laterality: N/A;  . Left heart catheterization with coronary angiogram N/A 06/17/2014    Procedure: LEFT HEART CATHETERIZATION WITH CORONARY ANGIOGRAM;  Surgeon: [name], MD;  Location: [facility] CATH LAB;  Service: Cardiovascular;  Laterality: N/A;  . Left heart catheterization with coronary angiogram N/A 02/06/2015    Procedure: LEFT HEART CATHETERIZATION WITH CORONARY ANGIOGRAM;  Surgeon: [name], MD;  Location: [facility] CATH LAB;  Service: Cardiovascular;  Laterality: N/A;  . Cardiac catheterization  02/06/2015    Procedure: CORONARY STENT INTERVENTION;  Surgeon: [name], MD;  Location: [facility] CATH LAB;  Service: Cardiovascular;;  Mid RCA  . Cystoscopy w/ retrogrades N/A 05/05/2015    Procedure: CYSTOSCOPY WITH BILATERAL RETROGRADE PYELOGRAMS AND BLADDER BIOPSIES;  Surgeon: [name], MD;  Location: [facility];  Service: Urology;  Laterality: N/A;  . Transurethral resection of bladder tumor N/A 05/31/2015    Procedure: Cystoscopy with clott evacuation and fulgeration of bleeders, retrograde pylegram;  Surgeon: [name], MD;  Location: [facility];  Service: Urology;  Laterality: N/A;     Current Outpatient Prescriptions  Medication Sig Dispense Refill  . aspirin 81 MG chewable tablet Chew 1 tablet (81 mg total) by mouth daily.    Marland Kitchen glimepiride (AMARYL) 2 MG tablet Take 2 mg by mouth daily.     Marland Kitchen lisinopril (PRINIVIL,ZESTRIL) 10 MG tablet Take 10 mg by mouth daily.    . metFORMIN (GLUCOPHAGE) 500 MG tablet Take 1 tablet (500  mg total) by mouth 2 (two) times daily with a meal. 60 tablet 11  . metoprolol succinate (TOPROL-XL) 25 MG 24 hr tablet TAKE 1 TABLET EVERY DAY 30 tablet 12  . nitroGLYCERIN (NITROSTAT) 0.4 MG SL tablet Place 1 tablet (0.4 mg total) under the tongue every 5 (five) minutes x 3 doses as needed for chest pain. 25 tablet 12  . Omega-3 Fatty Acids (FISH OIL) 1000 MG CAPS Take 4 capsules (4,000 mg total) by mouth daily.  0  . pantoprazole (PROTONIX) 40 MG tablet Take 1 tablet (40 mg total) by mouth daily. 30 tablet 2  . Polyethyl Glycol-Propyl Glycol (SYSTANE OP) Place 1-2 drops into both eyes daily as needed (For dry eyes.).     No current facility-administered medications for this visit.    Allergies:   Lipitor; Other; and Pravastatin    Social History:  The patient  reports that he quit smoking about 23 years ago. His smoking use included Cigarettes. He has a 30 pack-year smoking history. He has never used smokeless tobacco. He reports that he does not drink alcohol or use illicit drugs.   Family History:  The patient's family history is not on file.    ROS: All other systems are reviewed and negative. Unless otherwise mentioned in H&P    PHYSICAL EXAM: VS:  BP 124/80 mmHg  Pulse 69  Ht 5\' 8"  (1.727 m)  Wt 211 lb (95.709 kg)  BMI 32.09 kg/m2  SpO2 97% , BMI Body mass index is 32.09 kg/(m^2). GEN: Well nourished, well developed, in no acute distress HEENT: normal Neck: no JVD, carotid bruits, or masses Cardiac: RRR; no murmurs, rubs, or gallops,no edema  Respiratory:  clear to auscultation bilaterally, normal work of breathing GI: soft, nontender, nondistended, + BS, slightly obese. MS: no deformity or atrophy Skin: warm and dry, no rash Neuro:  Strength and sensation are intact Psych: euthymic mood, full affect  EKG: normal sinus rhythm with anterior Q waves, and nonspecific ST depression with T wave inversion in lead 3. Rate of 60 beats per minute  Recent Labs: 06/01/2015: BUN  16; Creatinine, Ser 1.42*; Hemoglobin 12.1*; Platelets 213; Potassium 4.5; Sodium 137    Lipid Panel    Component Value Date/Time   CHOL 222* 10/13/2014 0957   TRIG 201* 10/13/2014 0957   HDL 33* 10/13/2014 0957   CHOLHDL 6.7 10/13/2014 0957   VLDL 40 10/13/2014 0957   LDLCALC 149* 10/13/2014 0957      Wt Readings from Last 3 Encounters:  04/21/16 211 lb (95.709 kg)  03/30/16 209 lb 9.6 oz (95.074 kg)  02/17/16 209 lb 9.6 oz (95.074 kg)     ASSESSMENT AND PLAN:  1. Hypertension:blood pressure is much better controlled. Will not make any changes on his medication. Primary care is doing labs.  2. Coronary artery disease: He offers no complaints of fatigue discomfort in his chest or dyspnea on exertion. He will continue aspirin beta blocker and lisinopril as directed.    Current medicines are reviewed at length with the patient today.  Labs/ tests ordered today include: none  Orders Placed This Encounter  Procedures  . EKG 12-Lead     Disposition:   FU with one year Signed, Jory Sims, NP  04/21/2016 1:41 PM    [place]. [address] [place], [place] [address] Phone: [phone]; Fax: [phone]

## 2016-04-21 NOTE — Progress Notes (Signed)
Name: Timothy [name]    DOB: 09-13-1946  Age: 70 y.o.  MR#: [id number]       PCP:  Glo Herring., MD      Insurance: Payor: [employer] MEDICARE / Plan: [facility] MEDICARE / Product Type: *No Product type* /   CC:   No chief complaint on file.   VS Filed Vitals:   04/21/16 1324  BP: 124/80  Pulse: 69  Height: 5\' 8"  (1.727 m)  Weight: 211 lb (95.709 kg)  SpO2: 97%    Weights Current Weight  04/21/16 211 lb (95.709 kg)  03/30/16 209 lb 9.6 oz (95.074 kg)  02/17/16 209 lb 9.6 oz (95.074 kg)    Blood Pressure  BP Readings from Last 3 Encounters:  04/21/16 124/80  03/30/16 146/86  02/17/16 146/100     Admit date:  (Not on file) Last encounter with RMR:  Visit date not found   Allergy Lipitor; Other; and Pravastatin  Current Outpatient Prescriptions  Medication Sig Dispense Refill  . aspirin 81 MG chewable tablet Chew 1 tablet (81 mg total) by mouth daily.    Marland Kitchen glimepiride (AMARYL) 2 MG tablet Take 2 mg by mouth daily.     Marland Kitchen lisinopril (PRINIVIL,ZESTRIL) 10 MG tablet Take 10 mg by mouth daily.    . metFORMIN (GLUCOPHAGE) 500 MG tablet Take 1 tablet (500 mg total) by mouth 2 (two) times daily with a meal. 60 tablet 11  . metoprolol succinate (TOPROL-XL) 25 MG 24 hr tablet TAKE 1 TABLET EVERY DAY 30 tablet 12  . nitroGLYCERIN (NITROSTAT) 0.4 MG SL tablet Place 1 tablet (0.4 mg total) under the tongue every 5 (five) minutes x 3 doses as needed for chest pain. 25 tablet 12  . Omega-3 Fatty Acids (FISH OIL) 1000 MG CAPS Take 4 capsules (4,000 mg total) by mouth daily.  0  . pantoprazole (PROTONIX) 40 MG tablet Take 1 tablet (40 mg total) by mouth daily. 30 tablet 2  . Polyethyl Glycol-Propyl Glycol (SYSTANE OP) Place 1-2 drops into both eyes daily as needed (For dry eyes.).     No current facility-administered medications for this visit.    Discontinued Meds:   There are no discontinued medications.  Patient Active Problem List   Diagnosis Date Noted  . Hematuria  05/31/2015  . Gross hematuria 05/28/2015  . HOH (hard of hearing)   . HLD (hyperlipidemia)   . CAD (coronary artery disease)   . Hypertension   . Diabetes mellitus, type 2 ([place])   . Bladder cancer ([place])   . NSTEMI (non-ST elevated myocardial infarction) ([place]) 06/17/2014    LABS    Component Value Date/Time   NA 137 06/01/2015 0423   NA 139 05/31/2015 0420   NA 137 05/30/2015 0430   K 4.5 06/01/2015 0423   K 4.2 05/31/2015 0420   K 4.1 05/30/2015 0430   CL 104 06/01/2015 0423   CL 106 05/31/2015 0420   CL 105 05/30/2015 0430   CO2 26 06/01/2015 0423   CO2 25 05/31/2015 0420   CO2 26 05/30/2015 0430   GLUCOSE 114* 06/01/2015 0423   GLUCOSE 136* 05/31/2015 0420   GLUCOSE 132* 05/30/2015 0430   BUN 16 06/01/2015 0423   BUN 13 05/31/2015 0420   BUN 13 05/30/2015 0430   CREATININE 1.42* 06/01/2015 0423   CREATININE 1.43* 05/31/2015 0420   CREATININE 1.27* 05/30/2015 0430   CALCIUM 8.6* 06/01/2015 0423   CALCIUM 8.8* 05/31/2015 0420   CALCIUM 8.4* 05/30/2015 0430   GFRNONAA  49* 06/01/2015 0423   GFRNONAA 49* 05/31/2015 0420   GFRNONAA 56* 05/30/2015 0430   GFRAA 57* 06/01/2015 0423   GFRAA 57* 05/31/2015 0420   GFRAA >60 05/30/2015 0430   CMP     Component Value Date/Time   NA 137 06/01/2015 0423   K 4.5 06/01/2015 0423   CL 104 06/01/2015 0423   CO2 26 06/01/2015 0423   GLUCOSE 114* 06/01/2015 0423   BUN 16 06/01/2015 0423   CREATININE 1.42* 06/01/2015 0423   CALCIUM 8.6* 06/01/2015 0423   PROT 6.5 02/06/2015 1338   ALBUMIN 3.5 02/06/2015 1338   AST 44* 02/06/2015 1338   ALT 37 02/06/2015 1338   ALKPHOS 102 02/06/2015 1338   BILITOT 0.9 02/06/2015 1338   GFRNONAA 49* 06/01/2015 0423   GFRAA 57* 06/01/2015 0423       Component Value Date/Time   WBC 8.6 06/01/2015 0423   WBC 7.8 05/31/2015 0420   WBC 7.4 05/30/2015 0430   HGB 12.1* 06/01/2015 0423   HGB 13.0 05/31/2015 0420   HGB 13.0 05/30/2015 0430   HCT 35.5* 06/01/2015 0423   HCT 38.1* 05/31/2015  0420   HCT 38.8* 05/30/2015 0430   MCV 90.3 06/01/2015 0423   MCV 89.6 05/31/2015 0420   MCV 89.8 05/30/2015 0430    Lipid Panel     Component Value Date/Time   CHOL 222* 10/13/2014 0957   TRIG 201* 10/13/2014 0957   HDL 33* 10/13/2014 0957   CHOLHDL 6.7 10/13/2014 0957   VLDL 40 10/13/2014 0957   LDLCALC 149* 10/13/2014 0957    ABG    Component Value Date/Time   TCO2 20 02/06/2015 0633     Lab Results  Component Value Date   TSH 1.805 02/06/2015   BNP (last 3 results) No results for input(s): BNP in the last 8760 hours.  ProBNP (last 3 results) No results for input(s): PROBNP in the last 8760 hours.  Cardiac Panel (last 3 results) No results for input(s): CKTOTAL, CKMB, TROPONINI, RELINDX in the last 72 hours.  Iron/TIBC/Ferritin/ %Sat No results found for: IRON, TIBC, FERRITIN, IRONPCTSAT   EKG Orders placed or performed in visit on 04/21/16  . EKG 12-Lead     Prior Assessment and Plan Problem List as of 04/21/2016      Cardiovascular and Mediastinum   NSTEMI (non-ST elevated myocardial infarction) [facility])   Last Assessment & Plan 07/04/2014 Office Visit Written 07/04/2014  1:35 PM by Lendon Colonel, NP    He is s/p PTCA of the of the Cx beyond the AV groove. He is without complaints, is medically compliant. He is already referred to cardiac rehab. I have explained his medications to him, and emphasized the importance of compliance. He verbalizes understanding. We will see him again in 3 months. Sooner if he is symptomatic. He is to report and bleeding.       Hypertension   Last Assessment & Plan 07/04/2014 Office Visit Written 07/04/2014  1:35 PM by Lendon Colonel, NP    He is well controlled currently. No changes in his medication regimen.      CAD (coronary artery disease)     Endocrine   Diabetes mellitus, type 2 [facility])   Last Assessment & Plan 07/04/2014 Office Visit Written 07/04/2014  1:36 PM by Lendon Colonel, NP    He is to adhere to diabetic  diet.        Nervous and Auditory   HOH (hard of hearing)  Genitourinary   Bladder cancer ([place])   Gross hematuria     Other   HLD (hyperlipidemia)   Hematuria       Imaging: No results found.

## 2016-04-25 ENCOUNTER — Other Ambulatory Visit: Payer: Self-pay | Admitting: Urology

## 2016-04-25 DIAGNOSIS — N432 Other hydrocele: Secondary | ICD-10-CM

## 2016-05-13 DIAGNOSIS — E119 Type 2 diabetes mellitus without complications: Secondary | ICD-10-CM | POA: Diagnosis not present

## 2016-05-13 DIAGNOSIS — H33333 Multiple defects of retina without detachment, bilateral: Secondary | ICD-10-CM | POA: Diagnosis not present

## 2016-05-13 DIAGNOSIS — H524 Presbyopia: Secondary | ICD-10-CM | POA: Diagnosis not present

## 2016-05-13 DIAGNOSIS — H52223 Regular astigmatism, bilateral: Secondary | ICD-10-CM | POA: Diagnosis not present

## 2016-05-13 DIAGNOSIS — H5203 Hypermetropia, bilateral: Secondary | ICD-10-CM | POA: Diagnosis not present

## 2016-05-18 ENCOUNTER — Ambulatory Visit (HOSPITAL_COMMUNITY)
Admission: RE | Admit: 2016-05-18 | Discharge: 2016-05-18 | Disposition: A | Discharge status: Home / Self Care | Payer: Medicare Other | Source: Ambulatory Visit | Attending: Urology | Admitting: Urology

## 2016-05-18 DIAGNOSIS — N432 Other hydrocele: Secondary | ICD-10-CM | POA: Diagnosis not present

## 2016-05-18 DIAGNOSIS — N433 Hydrocele, unspecified: Secondary | ICD-10-CM | POA: Diagnosis not present

## 2016-05-31 ENCOUNTER — Other Ambulatory Visit (HOSPITAL_COMMUNITY)
Admission: RE | Admit: 2016-05-31 | Discharge: 2016-05-31 | Disposition: A | Discharge status: Home / Self Care | Payer: Medicare Other | Source: Other Acute Inpatient Hospital | Attending: Urology | Admitting: Urology

## 2016-05-31 ENCOUNTER — Ambulatory Visit (INDEPENDENT_AMBULATORY_CARE_PROVIDER_SITE_OTHER): Payer: Medicare Other | Admitting: Urology

## 2016-05-31 DIAGNOSIS — C679 Malignant neoplasm of bladder, unspecified: Secondary | ICD-10-CM

## 2016-05-31 DIAGNOSIS — R8299 Other abnormal findings in urine: Secondary | ICD-10-CM | POA: Diagnosis not present

## 2016-05-31 DIAGNOSIS — C61 Malignant neoplasm of prostate: Secondary | ICD-10-CM

## 2016-05-31 DIAGNOSIS — C670 Malignant neoplasm of trigone of bladder: Secondary | ICD-10-CM | POA: Diagnosis not present

## 2016-05-31 LAB — URINE MICROSCOPIC-ADD ON

## 2016-05-31 LAB — URINALYSIS, ROUTINE W REFLEX MICROSCOPIC
Bilirubin Urine: NEGATIVE
Glucose, UA: NEGATIVE mg/dL
Ketones, ur: NEGATIVE mg/dL
Leukocytes, UA: NEGATIVE
Nitrite: NEGATIVE
PH: 5.5 (ref 5.0–8.0)
PROTEIN: NEGATIVE mg/dL
Specific Gravity, Urine: 1.02 (ref 1.005–1.030)

## 2016-06-13 DIAGNOSIS — I10 Essential (primary) hypertension: Secondary | ICD-10-CM | POA: Diagnosis not present

## 2016-06-13 DIAGNOSIS — E782 Mixed hyperlipidemia: Secondary | ICD-10-CM | POA: Diagnosis not present

## 2016-06-13 DIAGNOSIS — E119 Type 2 diabetes mellitus without complications: Secondary | ICD-10-CM | POA: Diagnosis not present

## 2016-06-24 ENCOUNTER — Other Ambulatory Visit: Payer: Self-pay | Admitting: Adult Health

## 2016-07-18 DIAGNOSIS — Z1389 Encounter for screening for other disorder: Secondary | ICD-10-CM | POA: Diagnosis not present

## 2016-07-18 DIAGNOSIS — E119 Type 2 diabetes mellitus without complications: Secondary | ICD-10-CM | POA: Diagnosis not present

## 2016-07-18 DIAGNOSIS — I10 Essential (primary) hypertension: Secondary | ICD-10-CM | POA: Diagnosis not present

## 2016-08-05 DIAGNOSIS — I10 Essential (primary) hypertension: Secondary | ICD-10-CM | POA: Diagnosis not present

## 2016-08-05 DIAGNOSIS — E119 Type 2 diabetes mellitus without complications: Secondary | ICD-10-CM | POA: Diagnosis not present

## 2016-08-05 DIAGNOSIS — C675 Malignant neoplasm of bladder neck: Secondary | ICD-10-CM | POA: Diagnosis not present

## 2016-09-05 DIAGNOSIS — M25774 Osteophyte, right foot: Secondary | ICD-10-CM | POA: Diagnosis not present

## 2016-09-05 DIAGNOSIS — M7662 Achilles tendinitis, left leg: Secondary | ICD-10-CM | POA: Diagnosis not present

## 2016-09-05 DIAGNOSIS — M25775 Osteophyte, left foot: Secondary | ICD-10-CM | POA: Diagnosis not present

## 2016-09-05 DIAGNOSIS — M7661 Achilles tendinitis, right leg: Secondary | ICD-10-CM | POA: Diagnosis not present

## 2016-09-26 DIAGNOSIS — M79671 Pain in right foot: Secondary | ICD-10-CM | POA: Diagnosis not present

## 2016-09-26 DIAGNOSIS — M25579 Pain in unspecified ankle and joints of unspecified foot: Secondary | ICD-10-CM | POA: Diagnosis not present

## 2016-09-26 DIAGNOSIS — M7661 Achilles tendinitis, right leg: Secondary | ICD-10-CM | POA: Diagnosis not present

## 2016-09-26 DIAGNOSIS — M7662 Achilles tendinitis, left leg: Secondary | ICD-10-CM | POA: Diagnosis not present

## 2016-09-28 ENCOUNTER — Encounter (INDEPENDENT_AMBULATORY_CARE_PROVIDER_SITE_OTHER): Payer: Self-pay | Admitting: Internal Medicine

## 2016-09-28 ENCOUNTER — Ambulatory Visit (INDEPENDENT_AMBULATORY_CARE_PROVIDER_SITE_OTHER): Payer: Medicare Other | Admitting: Internal Medicine

## 2016-09-28 VITALS — BP 110/74 | HR 72 | Temp 98.0°F | Ht 68.0 in | Wt 214.0 lb

## 2016-09-28 DIAGNOSIS — K219 Gastro-esophageal reflux disease without esophagitis: Secondary | ICD-10-CM

## 2016-09-28 NOTE — Patient Instructions (Signed)
OV in 1 year.  

## 2016-09-28 NOTE — Progress Notes (Addendum)
Subjective:    Patient ID: Timothy [name], male    DOB: [birth year], 70 y.o.   MRN: [id number]  HPI Here today for f/u. He was last seen in April. Hx of GERD. H. Pylori negative. He tells me he feels good. He denies any abdominal pain (epigastric).  Hx of bladder cancer as is followed by [facility]. No problems voiding. His appetite is good. He has gained 5 pounds since his last visit. No acid reflux. He walks everyday on the treadmill x 15 minutes.  His last colonoscopy was in 2007 by Dr. Gala Romney (screening which revealed normal rectum, left sided diverticula. Remainder of colonic mucosa appeared normal.     Diabetic x 5-6 yrs.   Review of Systems Past Medical History:  Diagnosis Date  . Bladder cancer ([place])   . CAD (coronary artery disease)    a. 06/2014: NSTEMI s/p PTCA of LCx, 60-80% residual in distal LAD, 70% prox small D1.   b. 02/06/15 NSTEMI  s/p successful PCI/DES to mid RCA  . Diabetes mellitus, type 2 ([place])   . Elevated PSA   . HLD (hyperlipidemia)   . HOH (hard of hearing)   . Hypertension     Past Surgical History:  Procedure Laterality Date  . CARDIAC CATHETERIZATION  02/06/2015   Procedure: CORONARY STENT INTERVENTION;  Surgeon: [name], MD;  Location: [facility] CATH LAB;  Service: Cardiovascular;;  Mid RCA  . CYSTOSCOPY W/ RETROGRADES N/A 05/05/2015   Procedure: CYSTOSCOPY WITH BILATERAL RETROGRADE PYELOGRAMS AND BLADDER BIOPSIES;  Surgeon: [name], MD;  Location: [facility];  Service: Urology;  Laterality: N/A;  . LEFT HEART CATHETERIZATION WITH CORONARY ANGIOGRAM N/A 06/17/2014   Procedure: LEFT HEART CATHETERIZATION WITH CORONARY ANGIOGRAM;  Surgeon: [name], MD;  Location: [facility] CATH LAB;  Service: Cardiovascular;  Laterality: N/A;  . LEFT HEART CATHETERIZATION WITH CORONARY ANGIOGRAM N/A 02/06/2015   Procedure: LEFT HEART CATHETERIZATION WITH CORONARY ANGIOGRAM;  Surgeon: [name], MD;  Location: [facility] CATH LAB;  Service: Cardiovascular;  Laterality:  N/A;  . PROSTATE BIOPSY N/A 03/31/2014   Procedure: BIOPSY TRANSRECTAL ULTRASONIC PROSTATE (TUBP);  Surgeon: [name], MD;  Location: [facility];  Service: Urology;  Laterality: N/A;  . TRANSURETHRAL RESECTION OF BLADDER TUMOR N/A 03/14/2013   Procedure: TRANSURETHRAL RESECTION OF BLADDER TUMOR (TURBT);  Surgeon: [name], MD;  Location: [facility];  Service: Urology;  Laterality: N/A;  1 HR WITH MITOMYCIN INSTILLATION   . TRANSURETHRAL RESECTION OF BLADDER TUMOR N/A 09/30/2013   Procedure: TRANSURETHRAL RESECTION OF BLADDER TUMOR (TURBT);  Surgeon: [name], MD;  Location: [facility];  Service: Urology;  Laterality: N/A;  . TRANSURETHRAL RESECTION OF BLADDER TUMOR N/A 03/31/2014   Procedure: TRANSURETHRAL RESECTION OF BLADDER TUMOR (TURBT);  Surgeon: [name], MD;  Location: [facility];  Service: Urology;  Laterality: N/A;  . TRANSURETHRAL RESECTION OF BLADDER TUMOR N/A 05/31/2015   Procedure: Cystoscopy with clott evacuation and fulgeration of bleeders, retrograde pylegram;  Surgeon: [name], MD;  Location: [facility];  Service: Urology;  Laterality: N/A;    Allergies  Allergen Reactions  . Lipitor [Atorvastatin] Other (See Comments)    Myalgia  . Other Other (See Comments)    All Cholesterol Meds - "Don't agree with me"  . Pravastatin Other (See Comments)    Myalgia    Current Outpatient Prescriptions on File Prior to Visit  Medication Sig Dispense Refill  . aspirin 81 MG chewable tablet Chew 1 tablet (  81 mg total) by mouth daily.    Marland Kitchen glimepiride (AMARYL) 2 MG tablet Take 2 mg by mouth daily.     Marland Kitchen lisinopril (PRINIVIL,ZESTRIL) 10 MG tablet Take 10 mg by mouth daily.    . metFORMIN (GLUCOPHAGE) 500 MG tablet Take 1 tablet (500 mg total) by mouth 2 (two) times daily with a meal. 60 tablet 11  . metoprolol succinate (TOPROL-XL) 25 MG 24 hr tablet TAKE 1 TABLET BY MOUTH EVERY DAY 30  tablet 5  . nitroGLYCERIN (NITROSTAT) 0.4 MG SL tablet Place 1 tablet (0.4 mg total) under the tongue every 5 (five) minutes x 3 doses as needed for chest pain. 25 tablet 12  . Omega-3 Fatty Acids (FISH OIL) 1000 MG CAPS Take 4 capsules (4,000 mg total) by mouth daily.  0  . pantoprazole (PROTONIX) 40 MG tablet Take 1 tablet (40 mg total) by mouth daily. 30 tablet 2  . Polyethyl Glycol-Propyl Glycol (SYSTANE OP) Place 1-2 drops into both eyes daily as needed (For dry eyes.).     No current facility-administered medications on file prior to visit.        Objective:   Physical Exam Blood pressure 110/74, pulse 72, temperature 98 F (36.7 C), height 5\' 8"  (1.727 m), weight 214 lb (97.1 kg). Alert and oriented. Skin warm and dry. Oral mucosa is moist.   . Sclera anicteric, conjunctivae is pink. Thyroid not enlarged. No cervical lymphadenopathy. Lungs clear. Heart regular rate and rhythm.  Abdomen is soft. Bowel sounds are positive. No hepatomegaly. No abdominal masses felt. No tenderness.  No edema to lower extremities.           Assessment & Plan:  GERD. He is not having any problems at this time. He is doing well. Will see back in one year.

## 2016-09-29 ENCOUNTER — Ambulatory Visit (INDEPENDENT_AMBULATORY_CARE_PROVIDER_SITE_OTHER): Payer: Medicare Other | Admitting: Internal Medicine

## 2016-10-04 ENCOUNTER — Other Ambulatory Visit (HOSPITAL_COMMUNITY)
Admission: RE | Admit: 2016-10-04 | Discharge: 2016-10-04 | Disposition: A | Discharge status: Home / Self Care | Payer: Medicare Other | Source: Ambulatory Visit | Attending: Urology | Admitting: Urology

## 2016-10-04 ENCOUNTER — Ambulatory Visit (INDEPENDENT_AMBULATORY_CARE_PROVIDER_SITE_OTHER): Payer: Medicare Other | Admitting: Urology

## 2016-10-04 DIAGNOSIS — C670 Malignant neoplasm of trigone of bladder: Secondary | ICD-10-CM

## 2016-10-04 DIAGNOSIS — R8299 Other abnormal findings in urine: Secondary | ICD-10-CM | POA: Diagnosis not present

## 2016-10-04 DIAGNOSIS — C61 Malignant neoplasm of prostate: Secondary | ICD-10-CM | POA: Insufficient documentation

## 2016-10-04 DIAGNOSIS — N401 Enlarged prostate with lower urinary tract symptoms: Secondary | ICD-10-CM

## 2016-10-11 ENCOUNTER — Other Ambulatory Visit (HOSPITAL_COMMUNITY)
Admission: AD | Admit: 2016-10-11 | Discharge: 2016-10-11 | Disposition: A | Discharge status: Home / Self Care | Payer: Medicare Other | Source: Skilled Nursing Facility | Attending: Urology | Admitting: Urology

## 2016-10-11 DIAGNOSIS — C670 Malignant neoplasm of trigone of bladder: Secondary | ICD-10-CM | POA: Diagnosis not present

## 2016-10-11 LAB — URINALYSIS, ROUTINE W REFLEX MICROSCOPIC
Bilirubin Urine: NEGATIVE
Glucose, UA: NEGATIVE mg/dL
KETONES UR: NEGATIVE mg/dL
NITRITE: POSITIVE — AB
PROTEIN: 30 mg/dL — AB
Specific Gravity, Urine: 1.025 (ref 1.005–1.030)
pH: 5.5 (ref 5.0–8.0)

## 2016-10-11 LAB — URINE MICROSCOPIC-ADD ON

## 2016-10-21 DIAGNOSIS — Z1389 Encounter for screening for other disorder: Secondary | ICD-10-CM | POA: Diagnosis not present

## 2016-10-21 DIAGNOSIS — I739 Peripheral vascular disease, unspecified: Secondary | ICD-10-CM | POA: Diagnosis not present

## 2016-10-21 DIAGNOSIS — E1151 Type 2 diabetes mellitus with diabetic peripheral angiopathy without gangrene: Secondary | ICD-10-CM | POA: Diagnosis not present

## 2016-10-21 DIAGNOSIS — Z23 Encounter for immunization: Secondary | ICD-10-CM | POA: Diagnosis not present

## 2016-10-31 ENCOUNTER — Encounter (INDEPENDENT_AMBULATORY_CARE_PROVIDER_SITE_OTHER): Payer: Self-pay

## 2016-11-09 IMAGING — US US TRANSRECTAL
1 series · 14 of 16 positions shown · non-contrast
Comparison: none

[Series 1: us transrectal · 0.11mm/px · 14 of 20 slices shown]
[im 1/20]
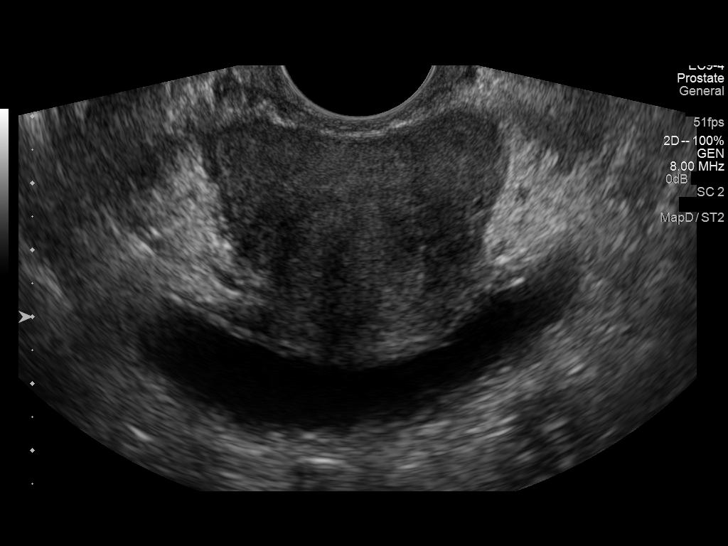
[im 2/20]
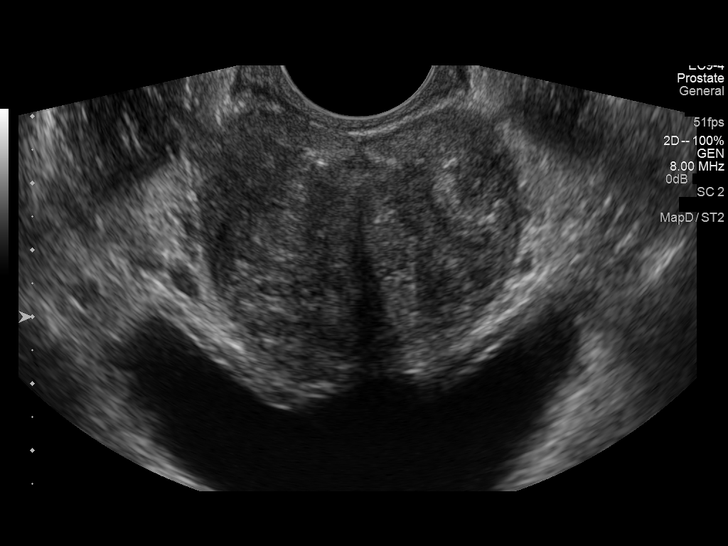
[im 3/20]
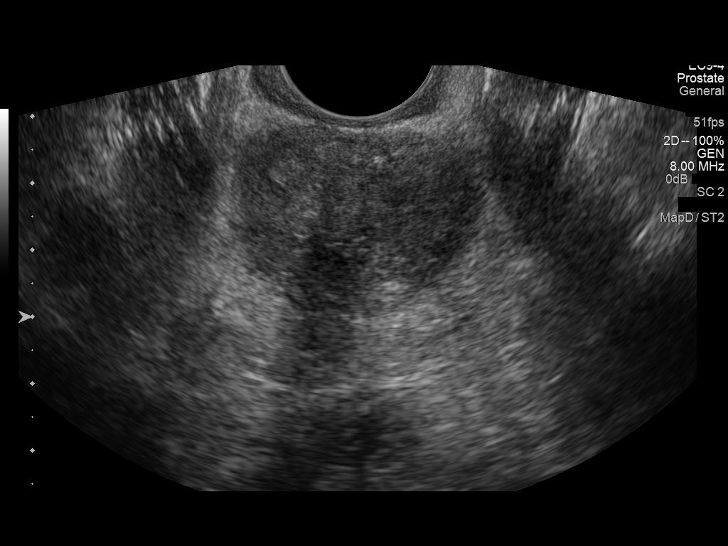
[im 6/20]
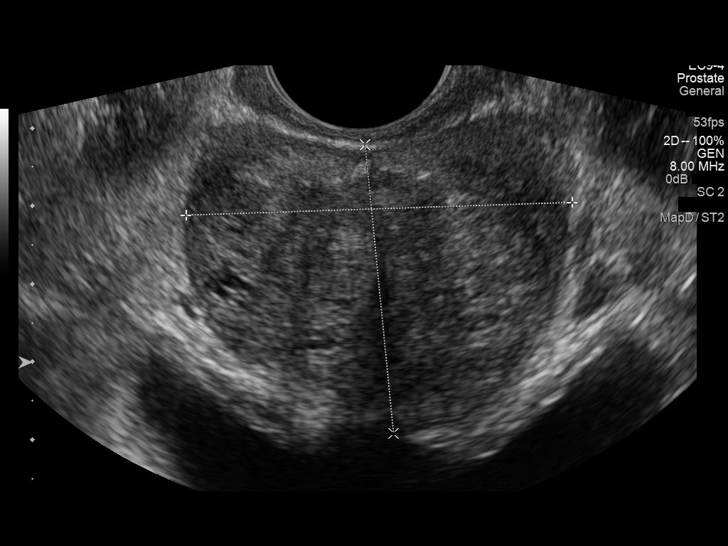
[im 7/20]
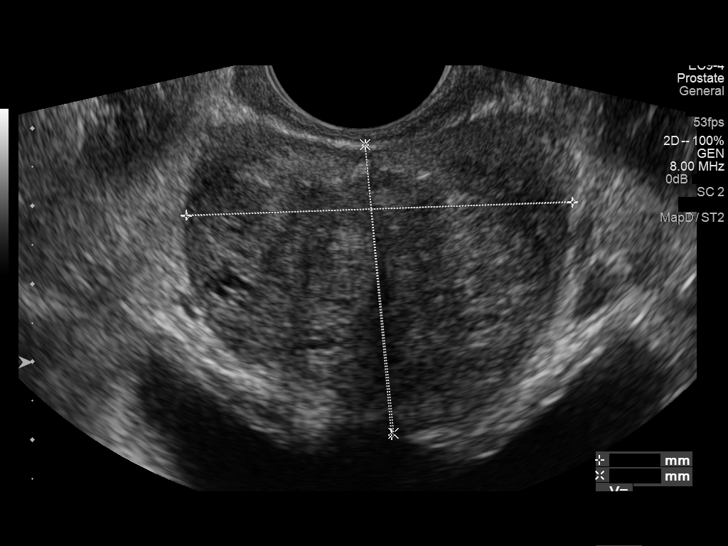
[im 8/20]
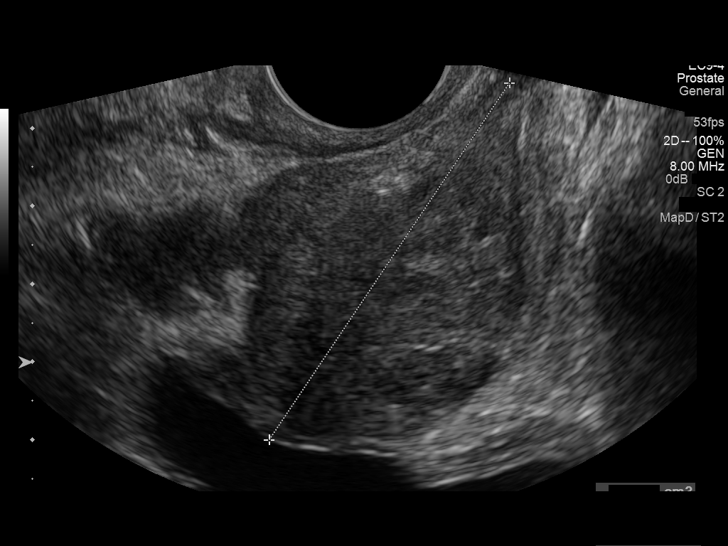
[im 9/20]
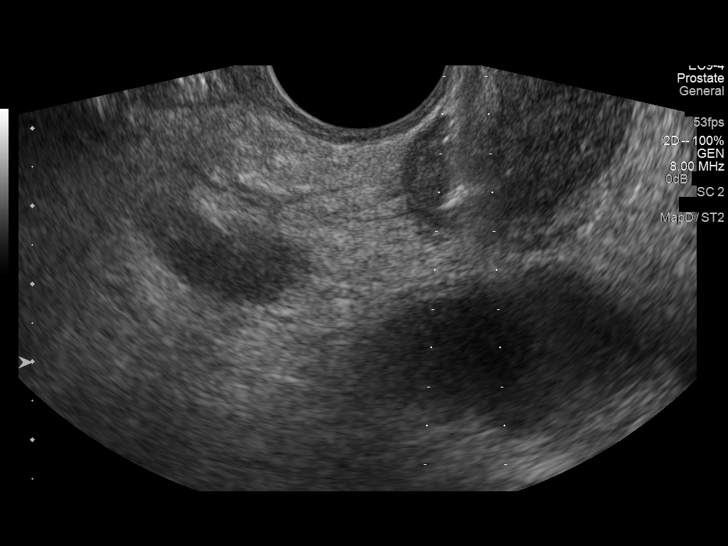
[im 11/20]
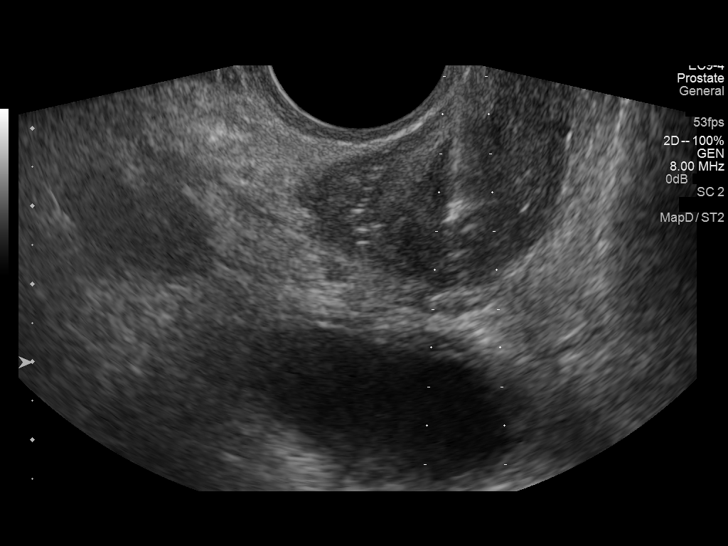
[im 12/20]
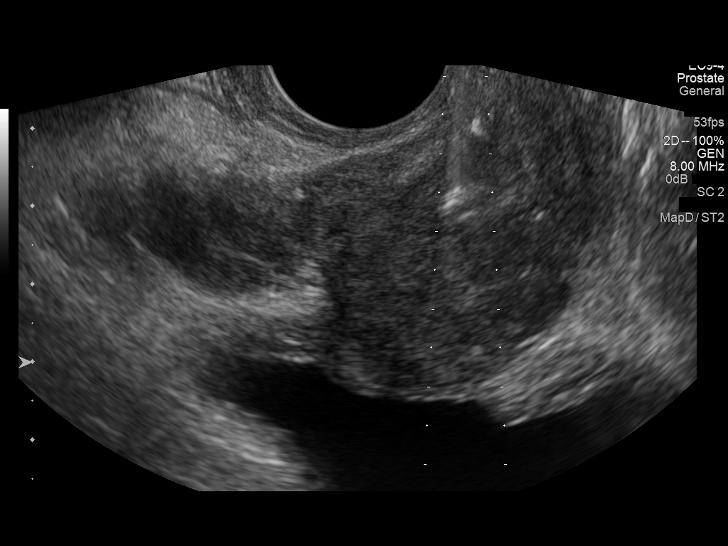
[im 13/20]
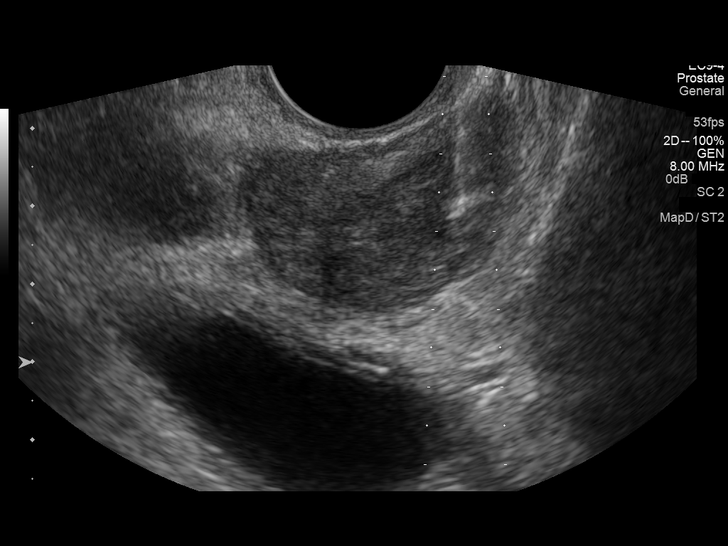
[im 16/20]
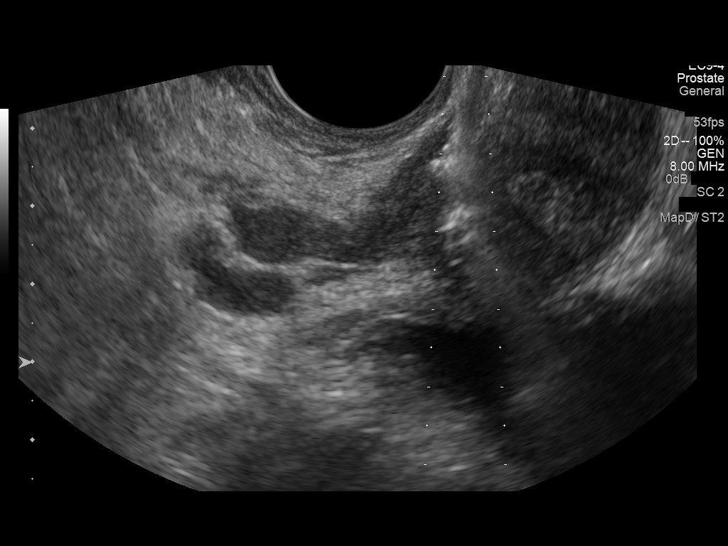
[im 17/20]
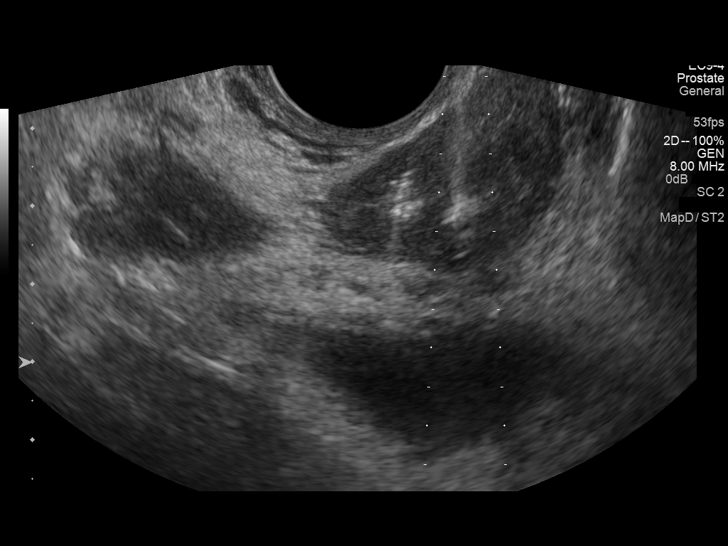
[im 18/20]
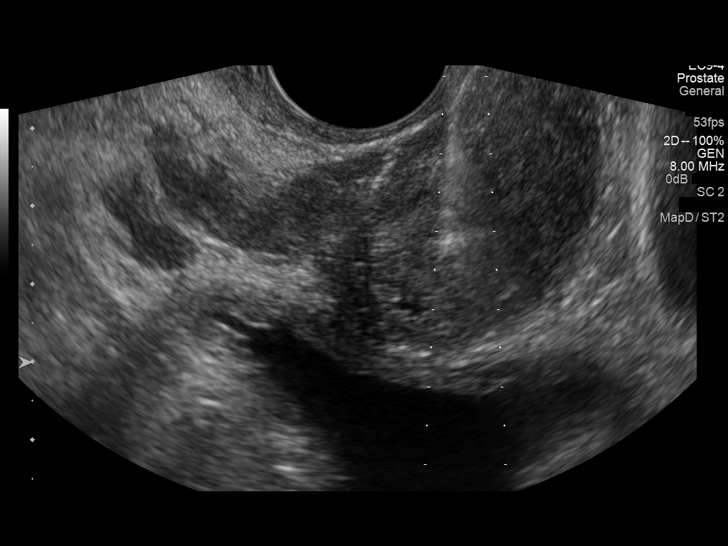
[im 20/20]
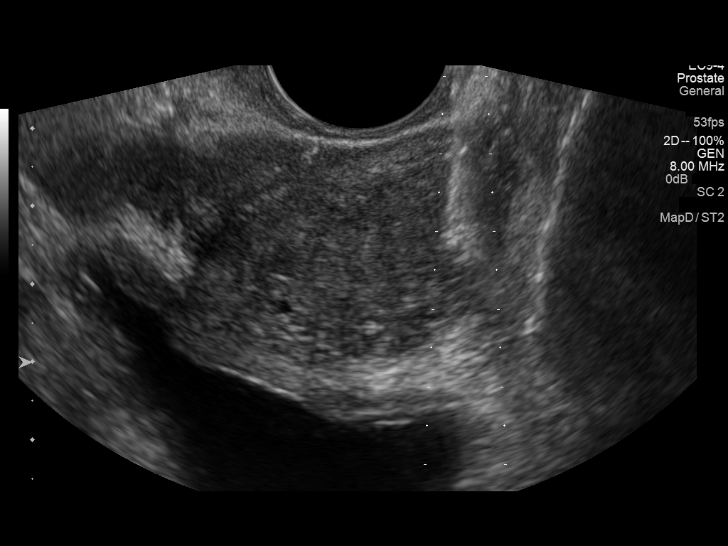

[14 of 16 positions shown; findings below may reference images not displayed]

Canned report from images found in remote index.

Refer to host system for actual result text.

## 2016-11-10 ENCOUNTER — Inpatient Hospital Stay (HOSPITAL_COMMUNITY)
Admission: EM | Admit: 2016-11-10 | Discharge: 2016-11-13 | DRG: 251 | Disposition: A | Discharge status: Home / Self Care | Payer: Medicare Other | Attending: Cardiovascular Disease | Admitting: Cardiovascular Disease

## 2016-11-10 ENCOUNTER — Encounter (HOSPITAL_COMMUNITY): Payer: Self-pay | Admitting: Emergency Medicine

## 2016-11-10 DIAGNOSIS — Z8551 Personal history of malignant neoplasm of bladder: Secondary | ICD-10-CM

## 2016-11-10 DIAGNOSIS — E7800 Pure hypercholesterolemia, unspecified: Secondary | ICD-10-CM | POA: Diagnosis not present

## 2016-11-10 DIAGNOSIS — Z7984 Long term (current) use of oral hypoglycemic drugs: Secondary | ICD-10-CM | POA: Diagnosis not present

## 2016-11-10 DIAGNOSIS — Z79899 Other long term (current) drug therapy: Secondary | ICD-10-CM

## 2016-11-10 DIAGNOSIS — Z87891 Personal history of nicotine dependence: Secondary | ICD-10-CM | POA: Diagnosis not present

## 2016-11-10 DIAGNOSIS — I2510 Atherosclerotic heart disease of native coronary artery without angina pectoris: Secondary | ICD-10-CM | POA: Diagnosis not present

## 2016-11-10 DIAGNOSIS — I10 Essential (primary) hypertension: Secondary | ICD-10-CM | POA: Diagnosis not present

## 2016-11-10 DIAGNOSIS — H9190 Unspecified hearing loss, unspecified ear: Secondary | ICD-10-CM

## 2016-11-10 DIAGNOSIS — Z7982 Long term (current) use of aspirin: Secondary | ICD-10-CM | POA: Diagnosis not present

## 2016-11-10 DIAGNOSIS — C679 Malignant neoplasm of bladder, unspecified: Secondary | ICD-10-CM | POA: Diagnosis not present

## 2016-11-10 DIAGNOSIS — I119 Hypertensive heart disease without heart failure: Secondary | ICD-10-CM | POA: Diagnosis not present

## 2016-11-10 DIAGNOSIS — I249 Acute ischemic heart disease, unspecified: Secondary | ICD-10-CM | POA: Diagnosis not present

## 2016-11-10 DIAGNOSIS — E119 Type 2 diabetes mellitus without complications: Secondary | ICD-10-CM | POA: Diagnosis not present

## 2016-11-10 DIAGNOSIS — E784 Other hyperlipidemia: Secondary | ICD-10-CM | POA: Diagnosis not present

## 2016-11-10 DIAGNOSIS — Z825 Family history of asthma and other chronic lower respiratory diseases: Secondary | ICD-10-CM

## 2016-11-10 DIAGNOSIS — R3129 Other microscopic hematuria: Secondary | ICD-10-CM | POA: Diagnosis not present

## 2016-11-10 DIAGNOSIS — I219 Acute myocardial infarction, unspecified: Secondary | ICD-10-CM | POA: Diagnosis not present

## 2016-11-10 DIAGNOSIS — R319 Hematuria, unspecified: Secondary | ICD-10-CM | POA: Diagnosis present

## 2016-11-10 DIAGNOSIS — E785 Hyperlipidemia, unspecified: Secondary | ICD-10-CM | POA: Diagnosis not present

## 2016-11-10 DIAGNOSIS — Z9861 Coronary angioplasty status: Secondary | ICD-10-CM

## 2016-11-10 DIAGNOSIS — I214 Non-ST elevation (NSTEMI) myocardial infarction: Secondary | ICD-10-CM | POA: Diagnosis not present

## 2016-11-10 DIAGNOSIS — I252 Old myocardial infarction: Secondary | ICD-10-CM

## 2016-11-10 DIAGNOSIS — I2582 Chronic total occlusion of coronary artery: Secondary | ICD-10-CM | POA: Diagnosis not present

## 2016-11-10 HISTORY — DX: Unspecified osteoarthritis, unspecified site: M19.90

## 2016-11-10 HISTORY — DX: Non-ST elevation (NSTEMI) myocardial infarction: I21.4

## 2016-11-10 MED ORDER — ACETAMINOPHEN 325 MG PO TABS
650.0000 mg | ORAL_TABLET | Freq: Once | ORAL | Status: AC
  Administered 2016-11-11: 650 mg via ORAL
  Filled 2016-11-10: qty 2

## 2016-11-10 NOTE — ED Triage Notes (Signed)
Pt states he had sudden onset of upper abdominal pain and bilat arm pain at approx 2200 this evening. Pt reports elevated BP today as well.

## 2016-11-10 NOTE — ED Notes (Signed)
EKG given to MD [facility].

## 2016-11-10 NOTE — ED Notes (Signed)
Pt c/o all over abdominal pain

## 2016-11-10 NOTE — ED Notes (Signed)
Pt states he was taking a BP medicine that was 10 mg. In his med list he was on 10 mg of Lisinopril. Pt states the decreased his dose to 5 mg. Pt states he was having muscle asches so he quit taking the Lisinopril.

## 2016-11-10 NOTE — ED Provider Notes (Signed)
[facility] DEPT Provider Note   CSN: [id number] Arrival date & time: 11/10/16  2237  By signing my name below, I, Gwenlyn Fudge, attest that this documentation has been prepared under the direction and in the presence of Ripley Fraise, MD. Electronically Signed: Gwenlyn Fudge, ED Scribe. 11/11/16. 12:10 AM.   History   Chief Complaint Chief Complaint  Patient presents with  . Abdominal Pain  . Arm Pain   HPI Comments: Timothy [name] is a 70 y.o. Male with PMHx of CAD, HLD, HTN, and DM who presents to the Emergency Department complaining of gradual onset, intermittent, epigastric abdominal cramping onset around 10 PM tonight. Pt states recent medications (blood pressure and cholesterol) have been causing his calves and biceps to cramp. Pt reports associated mild dysuria. He states he has had a normal endoscopy. Pt denies abdominal surgical hx. He denies fever, vomiting, chest pain, shortness of breath, constipation, diarrhea, blood in stool, melena, weakness in extremities, diaphoresis, dizziness, generalized weakness.   The history is provided by the patient. No language interpreter was used.  Abdominal Pain   This is a new problem. The current episode started 1 to 2 hours ago. The problem occurs hourly. The problem has not changed since onset.The pain is associated with an unknown factor. The pain is located in the epigastric region. The quality of the pain is cramping. The pain is moderate. Associated symptoms include dysuria. Pertinent negatives include fever, diarrhea, vomiting and constipation. Nothing aggravates the symptoms. Nothing relieves the symptoms. Past workup does not include surgery.    Past Medical History:  Diagnosis Date  . Bladder cancer ([place])   . CAD (coronary artery disease)    a. 06/2014: NSTEMI s/p PTCA of LCx, 60-80% residual in distal LAD, 70% prox small D1.   b. 02/06/15 NSTEMI  s/p successful PCI/DES to mid RCA  . Diabetes mellitus, type 2 ([place])   .  Elevated PSA   . HLD (hyperlipidemia)   . HOH (hard of hearing)   . Hypertension     Patient Active Problem List   Diagnosis Date Noted  . Hematuria 05/31/2015  . Gross hematuria 05/28/2015  . HOH (hard of hearing)   . HLD (hyperlipidemia)   . CAD (coronary artery disease)   . Hypertension   . Diabetes mellitus, type 2 ([place])   . Bladder cancer ([place])   . NSTEMI (non-ST elevated myocardial infarction) ([place]) 06/17/2014    Past Surgical History:  Procedure Laterality Date  . CARDIAC CATHETERIZATION  02/06/2015   Procedure: CORONARY STENT INTERVENTION;  Surgeon: [name], MD;  Location: [facility] CATH LAB;  Service: Cardiovascular;;  Mid RCA  . CYSTOSCOPY W/ RETROGRADES N/A 05/05/2015   Procedure: CYSTOSCOPY WITH BILATERAL RETROGRADE PYELOGRAMS AND BLADDER BIOPSIES;  Surgeon: [name], MD;  Location: [facility];  Service: Urology;  Laterality: N/A;  . LEFT HEART CATHETERIZATION WITH CORONARY ANGIOGRAM N/A 06/17/2014   Procedure: LEFT HEART CATHETERIZATION WITH CORONARY ANGIOGRAM;  Surgeon: [name], MD;  Location: [facility] CATH LAB;  Service: Cardiovascular;  Laterality: N/A;  . LEFT HEART CATHETERIZATION WITH CORONARY ANGIOGRAM N/A 02/06/2015   Procedure: LEFT HEART CATHETERIZATION WITH CORONARY ANGIOGRAM;  Surgeon: [name], MD;  Location: [facility] CATH LAB;  Service: Cardiovascular;  Laterality: N/A;  . PROSTATE BIOPSY N/A 03/31/2014   Procedure: BIOPSY TRANSRECTAL ULTRASONIC PROSTATE (TUBP);  Surgeon: [name], MD;  Location: [facility];  Service: Urology;  Laterality: N/A;  . TRANSURETHRAL RESECTION OF BLADDER TUMOR N/A 03/14/2013   Procedure: TRANSURETHRAL  RESECTION OF BLADDER TUMOR (TURBT);  Surgeon: [name], MD;  Location: [facility];  Service: Urology;  Laterality: N/A;  1 HR WITH MITOMYCIN INSTILLATION   . TRANSURETHRAL RESECTION OF BLADDER TUMOR N/A 09/30/2013   Procedure: TRANSURETHRAL RESECTION OF BLADDER TUMOR (TURBT);   Surgeon: [name], MD;  Location: [facility];  Service: Urology;  Laterality: N/A;  . TRANSURETHRAL RESECTION OF BLADDER TUMOR N/A 03/31/2014   Procedure: TRANSURETHRAL RESECTION OF BLADDER TUMOR (TURBT);  Surgeon: [name], MD;  Location: [facility];  Service: Urology;  Laterality: N/A;  . TRANSURETHRAL RESECTION OF BLADDER TUMOR N/A 05/31/2015   Procedure: Cystoscopy with clott evacuation and fulgeration of bleeders, retrograde pylegram;  Surgeon: [name], MD;  Location: [facility];  Service: Urology;  Laterality: N/A;    Home Medications    Prior to Admission medications   Medication Sig Start Date End Date Taking? Authorizing Provider  aspirin 81 MG chewable tablet Chew 1 tablet (81 mg total) by mouth daily. 06/19/14   Almyra Deforest, PA  glimepiride (AMARYL) 2 MG tablet Take 2 mg by mouth daily.     Historical Provider, MD  lisinopril (PRINIVIL,ZESTRIL) 10 MG tablet Take 10 mg by mouth daily.    Historical Provider, MD  metFORMIN (GLUCOPHAGE) 500 MG tablet Take 1 tablet (500 mg total) by mouth 2 (two) times daily with a meal. 02/09/15   Eileen [place], PA-C  metoprolol succinate (TOPROL-XL) 25 MG 24 hr tablet TAKE 1 TABLET BY MOUTH EVERY DAY 06/24/16   Lendon Colonel, NP  nitroGLYCERIN (NITROSTAT) 0.4 MG SL tablet Place 1 tablet (0.4 mg total) under the tongue every 5 (five) minutes x 3 doses as needed for chest pain. 02/07/15   Eileen [place], PA-C  Omega-3 Fatty Acids (FISH OIL) 1000 MG CAPS Take 4 capsules (4,000 mg total) by mouth daily. 07/14/15   Lendon Colonel, NP  Polyethyl Glycol-Propyl Glycol (SYSTANE OP) Place 1-2 drops into both eyes daily as needed (For dry eyes.).    Historical Provider, MD    Family History Family History  Problem Relation Age of Onset  . Cancer Child   . Asthma Mother     Social History Social History  Substance Use Topics  . Smoking status: Former Smoker    Packs/day: 1.00    Years: 30.00     Types: Cigarettes    Quit date: 03/11/1993  . Smokeless tobacco: Never Used  . Alcohol use No   Allergies   Lipitor [atorvastatin]; Other; and Pravastatin  Review of Systems Review of Systems  Constitutional: Negative for fever.  Respiratory: Negative for shortness of breath.   Cardiovascular: Negative for chest pain.  Gastrointestinal: Positive for abdominal pain. Negative for constipation, diarrhea and vomiting.  Genitourinary: Positive for dysuria.  Neurological: Negative for dizziness, weakness and numbness.  All other systems reviewed and are negative.  Physical Exam Updated Vital Signs BP 170/96 (BP Location: Left Arm)   Pulse 78   Temp 97.7 F (36.5 C) (Oral)   Resp 18   Ht 5\' 8"  (1.727 m)   Wt 212 lb (96.2 kg)   SpO2 99%   BMI 32.23 kg/m   Physical Exam CONSTITUTIONAL: Well developed/well nourished HEAD: Normocephalic/atraumatic EYES: EOMI/PERRL ENMT: Mucous membranes moist NECK: supple no meningeal signs SPINE/BACK:entire spine nontender CV: S1/S2 noted, no murmurs/rubs/gallops noted LUNGS: Lungs are clear to auscultation bilaterally, no apparent distress ABDOMEN: soft, nontender, no rebound or guarding, bowel sounds noted throughout abdomen GU:no cva  tenderness NEURO: Pt is awake/alert/appropriate, moves all extremitiesx4.  No facial droop.   EXTREMITIES: pulses normal/equal x4, full ROM, no edema to extremities noted, no tenderness to extremities noted SKIN: warm, color normal PSYCH: no abnormalities of mood noted, alert and oriented to situation  ED Treatments / Results  DIAGNOSTIC STUDIES: Oxygen Saturation is 99% on RA, normal by my interpretation.    COORDINATION OF CARE: 11:38 PM Discussed treatment plan with pt at bedside which includes lab work and pt agreed to plan.  Labs (all labs ordered are listed, but only abnormal results are displayed) Labs Reviewed  COMPREHENSIVE METABOLIC PANEL - Abnormal; Notable for the following:       Result Value    Glucose, Bld 160 (*)    Creatinine, Ser 1.39 (*)    GFR calc non Af Amer 50 (*)    GFR calc Af Amer 58 (*)    All other components within normal limits  TROPONIN I - Abnormal; Notable for the following:    Troponin I 0.13 (*)    All other components within normal limits  URINALYSIS, ROUTINE W REFLEX MICROSCOPIC - Abnormal; Notable for the following:    Glucose, UA 50 (*)    Hgb urine dipstick SMALL (*)    All other components within normal limits  CK - Abnormal; Notable for the following:    Total CK 451 (*)    All other components within normal limits  CBC WITH DIFFERENTIAL/PLATELET  LIPASE, BLOOD    EKG  EKG Interpretation  Date/Time:  Thursday November 10 2016 23:45:48 EST Ventricular Rate:  74 PR Interval:    QRS Duration: 96 QT Interval:  373 QTC Calculation: 414 R Axis:   66 Text Interpretation:  Sinus rhythm Low voltage, precordial leads Borderline T wave abnormalities Confirmed by Christy Gentles  MD, Dwan Fennel ([id number]) on 11/11/2016 12:08:50 AM       Radiology No results found.  Procedures Procedures (including critical care time)  Medications Ordered in ED Medications  acetaminophen (TYLENOL) tablet 650 mg (650 mg Oral Given 11/11/16 0025)  aspirin chewable tablet 324 mg (324 mg Oral Given 11/11/16 0046)   Initial Impression / Assessment and Plan / ED Course  I have reviewed the triage vital signs and the nursing notes.  Pertinent labs  results that were available during my care of the patient were reviewed by me and considered in my medical decision making (see chart for details).  Clinical Course    I personally performed the services described in this documentation, which was scribed in my presence. The recorded information has been reviewed and is accurate.     2:16 AM  Pt with extensive h/o CAD He has presented previously with similar symptoms and had non-stemi that required cardiac cath He currently has elevated troponin with no EKG changes I spoke to  Dr Reece Levy with cardiology We reviewed chart together Due to his other co-morbidities including hematuria/bladder CA, he recommends admitting [facility] hospital[facility] at [facility] and cardiology consultation Pt agreeable with plan He is awake/alert, no active CP at this time 2:28 AM D/W DR LE  WILL ADMIT LIKELY TRANSFER TO [place] Final Clinical Impressions(s) / ED Diagnoses   Final diagnoses:  Non-STEMI (non-ST elevated myocardial infarction) [facility])    New Prescriptions New Prescriptions   No medications on file     Ripley Fraise, MD 11/11/16 [phone]

## 2016-11-11 ENCOUNTER — Encounter: Payer: Self-pay | Admitting: Adult Health

## 2016-11-11 ENCOUNTER — Other Ambulatory Visit: Payer: Self-pay | Admitting: Adult Health

## 2016-11-11 ENCOUNTER — Encounter (HOSPITAL_COMMUNITY): Payer: Self-pay | Admitting: General Practice

## 2016-11-11 ENCOUNTER — Telehealth: Payer: Self-pay | Admitting: Internal Medicine

## 2016-11-11 ENCOUNTER — Encounter (HOSPITAL_COMMUNITY)
Admission: EM | Disposition: A | Discharge status: Home / Self Care | Payer: Self-pay | Source: Home / Self Care | Attending: Cardiovascular Disease

## 2016-11-11 ENCOUNTER — Emergency Department (HOSPITAL_COMMUNITY): Payer: Medicare Other

## 2016-11-11 ENCOUNTER — Inpatient Hospital Stay (HOSPITAL_COMMUNITY): Payer: Medicare Other

## 2016-11-11 DIAGNOSIS — Z7984 Long term (current) use of oral hypoglycemic drugs: Secondary | ICD-10-CM | POA: Diagnosis not present

## 2016-11-11 DIAGNOSIS — I214 Non-ST elevation (NSTEMI) myocardial infarction: Principal | ICD-10-CM

## 2016-11-11 DIAGNOSIS — I219 Acute myocardial infarction, unspecified: Secondary | ICD-10-CM | POA: Diagnosis not present

## 2016-11-11 DIAGNOSIS — E784 Other hyperlipidemia: Secondary | ICD-10-CM | POA: Diagnosis not present

## 2016-11-11 DIAGNOSIS — R030 Elevated blood-pressure reading, without diagnosis of hypertension: Secondary | ICD-10-CM | POA: Diagnosis not present

## 2016-11-11 DIAGNOSIS — E119 Type 2 diabetes mellitus without complications: Secondary | ICD-10-CM | POA: Diagnosis not present

## 2016-11-11 DIAGNOSIS — Z87891 Personal history of nicotine dependence: Secondary | ICD-10-CM | POA: Diagnosis not present

## 2016-11-11 DIAGNOSIS — C679 Malignant neoplasm of bladder, unspecified: Secondary | ICD-10-CM

## 2016-11-11 DIAGNOSIS — I119 Hypertensive heart disease without heart failure: Secondary | ICD-10-CM | POA: Diagnosis not present

## 2016-11-11 DIAGNOSIS — Z7982 Long term (current) use of aspirin: Secondary | ICD-10-CM | POA: Diagnosis not present

## 2016-11-11 DIAGNOSIS — Z8551 Personal history of malignant neoplasm of bladder: Secondary | ICD-10-CM | POA: Diagnosis not present

## 2016-11-11 DIAGNOSIS — H9190 Unspecified hearing loss, unspecified ear: Secondary | ICD-10-CM | POA: Diagnosis present

## 2016-11-11 DIAGNOSIS — E7800 Pure hypercholesterolemia, unspecified: Secondary | ICD-10-CM | POA: Diagnosis not present

## 2016-11-11 DIAGNOSIS — R3129 Other microscopic hematuria: Secondary | ICD-10-CM | POA: Diagnosis present

## 2016-11-11 DIAGNOSIS — Z825 Family history of asthma and other chronic lower respiratory diseases: Secondary | ICD-10-CM | POA: Diagnosis not present

## 2016-11-11 DIAGNOSIS — Z79899 Other long term (current) drug therapy: Secondary | ICD-10-CM | POA: Diagnosis not present

## 2016-11-11 DIAGNOSIS — I2510 Atherosclerotic heart disease of native coronary artery without angina pectoris: Secondary | ICD-10-CM

## 2016-11-11 DIAGNOSIS — I252 Old myocardial infarction: Secondary | ICD-10-CM | POA: Diagnosis not present

## 2016-11-11 DIAGNOSIS — I249 Acute ischemic heart disease, unspecified: Secondary | ICD-10-CM | POA: Diagnosis present

## 2016-11-11 DIAGNOSIS — R079 Chest pain, unspecified: Secondary | ICD-10-CM | POA: Diagnosis not present

## 2016-11-11 DIAGNOSIS — I10 Essential (primary) hypertension: Secondary | ICD-10-CM | POA: Diagnosis not present

## 2016-11-11 DIAGNOSIS — Z9861 Coronary angioplasty status: Secondary | ICD-10-CM | POA: Diagnosis not present

## 2016-11-11 DIAGNOSIS — E785 Hyperlipidemia, unspecified: Secondary | ICD-10-CM | POA: Diagnosis present

## 2016-11-11 DIAGNOSIS — I2582 Chronic total occlusion of coronary artery: Secondary | ICD-10-CM | POA: Diagnosis not present

## 2016-11-11 DIAGNOSIS — I213 ST elevation (STEMI) myocardial infarction of unspecified site: Secondary | ICD-10-CM | POA: Diagnosis not present

## 2016-11-11 HISTORY — PX: CARDIAC CATHETERIZATION: SHX172

## 2016-11-11 HISTORY — PX: CORONARY ANGIOPLASTY: SHX604

## 2016-11-11 LAB — CBC WITH DIFFERENTIAL/PLATELET
BASOS ABS: 0 10*3/uL (ref 0.0–0.1)
BASOS PCT: 0 %
Eosinophils Absolute: 0.2 10*3/uL (ref 0.0–0.7)
Eosinophils Relative: 2 %
HEMATOCRIT: 43.4 % (ref 39.0–52.0)
HEMOGLOBIN: 14.7 g/dL (ref 13.0–17.0)
LYMPHS PCT: 25 %
Lymphs Abs: 1.9 10*3/uL (ref 0.7–4.0)
MCH: 31.3 pg (ref 26.0–34.0)
MCHC: 33.9 g/dL (ref 30.0–36.0)
MCV: 92.5 fL (ref 78.0–100.0)
MONO ABS: 0.5 10*3/uL (ref 0.1–1.0)
MONOS PCT: 7 %
NEUTROS ABS: 5 10*3/uL (ref 1.7–7.7)
NEUTROS PCT: 66 %
Platelets: 193 10*3/uL (ref 150–400)
RBC: 4.69 MIL/uL (ref 4.22–5.81)
RDW: 14.5 % (ref 11.5–15.5)
WBC: 7.6 10*3/uL (ref 4.0–10.5)

## 2016-11-11 LAB — CBC
HCT: 41.7 % (ref 39.0–52.0)
HEMOGLOBIN: 14.6 g/dL (ref 13.0–17.0)
MCH: 32.4 pg (ref 26.0–34.0)
MCHC: 35 g/dL (ref 30.0–36.0)
MCV: 92.5 fL (ref 78.0–100.0)
PLATELETS: 189 10*3/uL (ref 150–400)
RBC: 4.51 MIL/uL (ref 4.22–5.81)
RDW: 14.3 % (ref 11.5–15.5)
WBC: 8.5 10*3/uL (ref 4.0–10.5)

## 2016-11-11 LAB — CK: CK TOTAL: 451 U/L — AB (ref 49–397)

## 2016-11-11 LAB — CREATININE, SERUM
CREATININE: 1.37 mg/dL — AB (ref 0.61–1.24)
GFR calc Af Amer: 59 mL/min — ABNORMAL LOW (ref >60)
GFR calc non Af Amer: 51 mL/min — ABNORMAL LOW (ref >60)

## 2016-11-11 LAB — COMPREHENSIVE METABOLIC PANEL
ALBUMIN: 4.2 g/dL (ref 3.5–5.0)
ALT: 20 U/L (ref 17–63)
ANION GAP: 10 (ref 5–15)
AST: 32 U/L (ref 15–41)
Alkaline Phosphatase: 47 U/L (ref 38–126)
BUN: 14 mg/dL (ref 6–20)
CALCIUM: 8.9 mg/dL (ref 8.9–10.3)
CO2: 23 mmol/L (ref 22–32)
Chloride: 103 mmol/L (ref 101–111)
Creatinine, Ser: 1.39 mg/dL — ABNORMAL HIGH (ref 0.61–1.24)
GFR calc non Af Amer: 50 mL/min — ABNORMAL LOW (ref >60)
GFR, EST AFRICAN AMERICAN: 58 mL/min — AB (ref >60)
GLUCOSE: 160 mg/dL — AB (ref 65–99)
POTASSIUM: 3.9 mmol/L (ref 3.5–5.1)
SODIUM: 136 mmol/L (ref 135–145)
TOTAL PROTEIN: 7.2 g/dL (ref 6.5–8.1)
Total Bilirubin: 0.5 mg/dL (ref 0.3–1.2)

## 2016-11-11 LAB — URINALYSIS, ROUTINE W REFLEX MICROSCOPIC
BILIRUBIN URINE: NEGATIVE
Bacteria, UA: NONE SEEN
Glucose, UA: 50 mg/dL — AB
KETONES UR: NEGATIVE mg/dL
LEUKOCYTES UA: NEGATIVE
NITRITE: NEGATIVE
PROTEIN: NEGATIVE mg/dL
Specific Gravity, Urine: 1.015 (ref 1.005–1.030)
pH: 6 (ref 5.0–8.0)

## 2016-11-11 LAB — TROPONIN I
Troponin I: 0.13 ng/mL (ref <0.03)
Troponin I: 4.91 ng/mL (ref <0.03)
Troponin I: 7.26 ng/mL (ref <0.03)

## 2016-11-11 LAB — CBG MONITORING, ED
GLUCOSE-CAPILLARY: 111 mg/dL — AB (ref 65–99)
GLUCOSE-CAPILLARY: 127 mg/dL — AB (ref 65–99)
Glucose-Capillary: 160 mg/dL — ABNORMAL HIGH (ref 65–99)

## 2016-11-11 LAB — GLUCOSE, CAPILLARY
Glucose-Capillary: 181 mg/dL — ABNORMAL HIGH (ref 65–99)
Glucose-Capillary: 146 mg/dL — ABNORMAL HIGH (ref 65–99)

## 2016-11-11 LAB — POCT ACTIVATED CLOTTING TIME: Activated Clotting Time: 659 seconds

## 2016-11-11 LAB — PROTIME-INR
INR: 0.93
Prothrombin Time: 12.5 seconds (ref 11.4–15.2)

## 2016-11-11 LAB — LIPASE, BLOOD: LIPASE: 30 U/L (ref 11–51)

## 2016-11-11 SURGERY — LEFT HEART CATH AND CORONARY ANGIOGRAPHY
Anesthesia: LOCAL

## 2016-11-11 MED ORDER — VERAPAMIL HCL 2.5 MG/ML IV SOLN
INTRAVENOUS | Status: AC
  Filled 2016-11-11: qty 2

## 2016-11-11 MED ORDER — IOPAMIDOL (ISOVUE-370) INJECTION 76%
INTRAVENOUS | Status: AC
  Filled 2016-11-11: qty 50

## 2016-11-11 MED ORDER — HEPARIN (PORCINE) IN NACL 2-0.9 UNIT/ML-% IJ SOLN
INTRAMUSCULAR | Status: AC
  Filled 2016-11-11: qty 1000

## 2016-11-11 MED ORDER — METOPROLOL SUCCINATE ER 50 MG PO TB24
50.0000 mg | ORAL_TABLET | Freq: Every day | ORAL | Status: DC
  Administered 2016-11-11: 50 mg via ORAL
  Filled 2016-11-11 (×4): qty 1

## 2016-11-11 MED ORDER — HEPARIN (PORCINE) IN NACL 100-0.45 UNIT/ML-% IJ SOLN
950.0000 [IU]/h | INTRAMUSCULAR | Status: DC
  Administered 2016-11-11: 950 [IU]/h via INTRAVENOUS
  Filled 2016-11-11: qty 250

## 2016-11-11 MED ORDER — FENTANYL CITRATE (PF) 100 MCG/2ML IJ SOLN
INTRAMUSCULAR | Status: DC | PRN
  Administered 2016-11-11: 25 ug via INTRAVENOUS

## 2016-11-11 MED ORDER — INSULIN ASPART 100 UNIT/ML ~~LOC~~ SOLN
0.0000 [IU] | Freq: Three times a day (TID) | SUBCUTANEOUS | Status: DC
  Administered 2016-11-11: 18:00:00 3 [IU] via SUBCUTANEOUS
  Administered 2016-11-13: 2 [IU] via SUBCUTANEOUS

## 2016-11-11 MED ORDER — CLOPIDOGREL BISULFATE 75 MG PO TABS
300.0000 mg | ORAL_TABLET | Freq: Once | ORAL | Status: AC
  Administered 2016-11-12: 300 mg via ORAL
  Filled 2016-11-11: qty 4

## 2016-11-11 MED ORDER — IOPAMIDOL (ISOVUE-370) INJECTION 76%
INTRAVENOUS | Status: DC | PRN
Start: 2016-11-11 — End: 2016-11-11
  Administered 2016-11-11: 85 mL via INTRA_ARTERIAL

## 2016-11-11 MED ORDER — BIVALIRUDIN BOLUS VIA INFUSION - CUPID
INTRAVENOUS | Status: DC | PRN
  Administered 2016-11-11: 72.15 mg via INTRAVENOUS

## 2016-11-11 MED ORDER — HEPARIN SODIUM (PORCINE) 5000 UNIT/ML IJ SOLN
5000.0000 [IU] | Freq: Three times a day (TID) | INTRAMUSCULAR | Status: DC
  Filled 2016-11-11: qty 1

## 2016-11-11 MED ORDER — SODIUM CHLORIDE 0.9 % IV SOLN: 250.0000 mL | INTRAVENOUS | Status: DC | PRN

## 2016-11-11 MED ORDER — HEPARIN (PORCINE) IN NACL 2-0.9 UNIT/ML-% IJ SOLN
INTRAMUSCULAR | Status: DC | PRN
  Administered 2016-11-11: 10 mL via INTRA_ARTERIAL

## 2016-11-11 MED ORDER — LABETALOL HCL 5 MG/ML IV SOLN: 10.0000 mg | INTRAVENOUS | Status: AC | PRN

## 2016-11-11 MED ORDER — SODIUM CHLORIDE 0.9 % IV SOLN
INTRAVENOUS | Status: AC
  Administered 2016-11-11: 18:00:00 via INTRAVENOUS

## 2016-11-11 MED ORDER — LISINOPRIL 10 MG PO TABS
10.0000 mg | ORAL_TABLET | Freq: Every day | ORAL | Status: DC
  Administered 2016-11-11: 10 mg via ORAL
  Filled 2016-11-11 (×2): qty 1

## 2016-11-11 MED ORDER — TICAGRELOR 90 MG PO TABS
ORAL_TABLET | ORAL | Status: AC
  Filled 2016-11-11: qty 2

## 2016-11-11 MED ORDER — MORPHINE SULFATE (PF) 4 MG/ML IV SOLN: 2.0000 mg | INTRAVENOUS | Status: DC | PRN

## 2016-11-11 MED ORDER — MIDAZOLAM HCL 2 MG/2ML IJ SOLN
INTRAMUSCULAR | Status: DC | PRN
  Administered 2016-11-11: 2 mg via INTRAVENOUS

## 2016-11-11 MED ORDER — NITROGLYCERIN 1 MG/10 ML FOR IR/CATH LAB
INTRA_ARTERIAL | Status: AC
  Filled 2016-11-11: qty 10

## 2016-11-11 MED ORDER — INSULIN ASPART 100 UNIT/ML ~~LOC~~ SOLN: 0.0000 [IU] | Freq: Every day | SUBCUTANEOUS | Status: DC

## 2016-11-11 MED ORDER — POLYETHYL GLYCOL-PROPYL GLYCOL 0.4-0.3 % OP GEL: Freq: Every day | OPHTHALMIC | Status: DC | PRN

## 2016-11-11 MED ORDER — HEPARIN SODIUM (PORCINE) 1000 UNIT/ML IJ SOLN
INTRAMUSCULAR | Status: AC
  Filled 2016-11-11: qty 1

## 2016-11-11 MED ORDER — ASPIRIN 81 MG PO CHEW
324.0000 mg | CHEWABLE_TABLET | Freq: Once | ORAL | Status: AC
  Administered 2016-11-11: 324 mg via ORAL
  Filled 2016-11-11: qty 4

## 2016-11-11 MED ORDER — HYDRALAZINE HCL 20 MG/ML IJ SOLN: 5.0000 mg | INTRAMUSCULAR | Status: AC | PRN

## 2016-11-11 MED ORDER — CLOPIDOGREL BISULFATE 75 MG PO TABS
75.0000 mg | ORAL_TABLET | Freq: Every day | ORAL | Status: DC
  Administered 2016-11-13: 75 mg via ORAL
  Filled 2016-11-11 (×2): qty 1

## 2016-11-11 MED ORDER — FENTANYL CITRATE (PF) 100 MCG/2ML IJ SOLN
INTRAMUSCULAR | Status: AC
  Filled 2016-11-11: qty 2

## 2016-11-11 MED ORDER — TICAGRELOR 90 MG PO TABS
ORAL_TABLET | ORAL | Status: DC | PRN
  Administered 2016-11-11: 180 mg via ORAL

## 2016-11-11 MED ORDER — MIDAZOLAM HCL 2 MG/2ML IJ SOLN
INTRAMUSCULAR | Status: AC
  Filled 2016-11-11: qty 2

## 2016-11-11 MED ORDER — SODIUM CHLORIDE 0.9% FLUSH: 3.0000 mL | INTRAVENOUS | Status: DC | PRN

## 2016-11-11 MED ORDER — NITROGLYCERIN 0.4 MG SL SUBL: 0.4000 mg | SUBLINGUAL_TABLET | SUBLINGUAL | Status: DC | PRN

## 2016-11-11 MED ORDER — ONDANSETRON HCL 4 MG/2ML IJ SOLN: 4.0000 mg | Freq: Four times a day (QID) | INTRAMUSCULAR | Status: DC | PRN

## 2016-11-11 MED ORDER — ACETAMINOPHEN 325 MG PO TABS: 650.0000 mg | ORAL_TABLET | ORAL | Status: DC | PRN

## 2016-11-11 MED ORDER — MORPHINE SULFATE (PF) 4 MG/ML IV SOLN
4.0000 mg | Freq: Once | INTRAVENOUS | Status: AC
  Administered 2016-11-11: 4 mg via INTRAVENOUS
  Filled 2016-11-11: qty 1

## 2016-11-11 MED ORDER — OMEGA-3-ACID ETHYL ESTERS 1 G PO CAPS
2.0000 g | ORAL_CAPSULE | Freq: Two times a day (BID) | ORAL | Status: DC
  Administered 2016-11-11 – 2016-11-13 (×4): 2 g via ORAL
  Filled 2016-11-11 (×4): qty 2

## 2016-11-11 MED ORDER — ASPIRIN 81 MG PO CHEW
81.0000 mg | CHEWABLE_TABLET | Freq: Every day | ORAL | Status: DC
  Administered 2016-11-12 – 2016-11-13 (×2): 81 mg via ORAL
  Filled 2016-11-11 (×2): qty 1

## 2016-11-11 MED ORDER — BIVALIRUDIN 250 MG IV SOLR
INTRAVENOUS | Status: AC
  Filled 2016-11-11: qty 250

## 2016-11-11 MED ORDER — SODIUM CHLORIDE 0.9 % IV SOLN
INTRAVENOUS | Status: DC | PRN
  Administered 2016-11-11: 1.75 mg/kg/h via INTRAVENOUS

## 2016-11-11 MED ORDER — LIDOCAINE HCL (PF) 1 % IJ SOLN
INTRAMUSCULAR | Status: AC
  Filled 2016-11-11: qty 30

## 2016-11-11 MED ORDER — POLYVINYL ALCOHOL 1.4 % OP SOLN
1.0000 [drp] | Freq: Every day | OPHTHALMIC | Status: DC | PRN
  Filled 2016-11-11: qty 15

## 2016-11-11 MED ORDER — HEPARIN SODIUM (PORCINE) 1000 UNIT/ML IJ SOLN
INTRAMUSCULAR | Status: DC | PRN
  Administered 2016-11-11: 5000 [IU] via INTRAVENOUS

## 2016-11-11 MED ORDER — METOPROLOL SUCCINATE ER 50 MG PO TB24
ORAL_TABLET | ORAL | Status: AC
  Filled 2016-11-11: qty 1

## 2016-11-11 MED ORDER — SODIUM CHLORIDE 0.9% FLUSH: 3.0000 mL | Freq: Two times a day (BID) | INTRAVENOUS | Status: DC

## 2016-11-11 MED ORDER — NITROGLYCERIN 1 MG/10 ML FOR IR/CATH LAB
INTRA_ARTERIAL | Status: DC | PRN
  Administered 2016-11-11: 200 ug via INTRACORONARY

## 2016-11-11 MED ORDER — LIDOCAINE HCL (PF) 1 % IJ SOLN
INTRAMUSCULAR | Status: DC | PRN
  Administered 2016-11-11: 2 mL via INTRADERMAL

## 2016-11-11 MED ORDER — SODIUM CHLORIDE 0.9% FLUSH
3.0000 mL | Freq: Two times a day (BID) | INTRAVENOUS | Status: DC
  Administered 2016-11-11 – 2016-11-12 (×2): 3 mL via INTRAVENOUS

## 2016-11-11 MED ORDER — HEPARIN BOLUS VIA INFUSION
4000.0000 [IU] | Freq: Once | INTRAVENOUS | Status: AC
  Administered 2016-11-11: 4000 [IU] via INTRAVENOUS

## 2016-11-11 MED ORDER — ASPIRIN 81 MG PO CHEW
81.0000 mg | CHEWABLE_TABLET | Freq: Every day | ORAL | Status: DC
  Administered 2016-11-11: 81 mg via ORAL
  Filled 2016-11-11 (×2): qty 1

## 2016-11-11 SURGICAL SUPPLY — 18 items
BALLN EUPHORA RX 2.0X15 (BALLOONS) ×2
BALLOON EUPHORA RX 2.0X15 (BALLOONS) IMPLANT
CATH EXPO 5F FL3.5 (CATHETERS) ×1 IMPLANT
CATH INFINITI JR4 5F (CATHETERS) ×1 IMPLANT
CATH LAUNCHER 6FR 3DRIGHT (CATHETERS) IMPLANT
CATHETER LAUNCHER 6FR 3DRIGHT (CATHETERS) ×2
DEVICE RAD COMP TR BAND LRG (VASCULAR PRODUCTS) ×1 IMPLANT
GLIDESHEATH SLEND SS 6F .021 (SHEATH) ×1 IMPLANT
GUIDEWIRE INQWIRE 1.5J.035X260 (WIRE) IMPLANT
INQWIRE 1.5J .035X260CM (WIRE) ×2
KIT ENCORE 26 ADVANTAGE (KITS) ×1 IMPLANT
KIT HEART LEFT (KITS) ×2 IMPLANT
PACK CARDIAC CATHETERIZATION (CUSTOM PROCEDURE TRAY) ×2 IMPLANT
TRANSDUCER W/STOPCOCK (MISCELLANEOUS) ×2 IMPLANT
TUBING CIL FLEX 10 FLL-RA (TUBING) ×2 IMPLANT
VALVE GUARDIAN II ~~LOC~~ HEMO (MISCELLANEOUS) ×1 IMPLANT
WIRE ASAHI PROWATER 180CM (WIRE) ×1 IMPLANT
WIRE HI TORQ BMW 190CM (WIRE) ×1 IMPLANT

## 2016-11-11 NOTE — Progress Notes (Addendum)
Brief note: Patient was seen and examined in ER . He is waiting to be transferred to [facility].  Pt was admitted this morning. Please see H&P by Dr. Marin Comment for detail.   70 year old male with history of bladder cancer, known coronary artery disease, had NSTEMI in July 2015 s/p PTCA to LCx and PCI/DES to mid RCA, DM, HTN, intolerance to statin, presented to the ER with left sided  chest pain. Patient was found to have NSTEMI with troponin of 4.91. He was just started on heparin for systemic anticoagulation. As per medical record, discussed with the cardiologist overnight and arranged to transfer patient to [facility]. Patient is waiting for transportation this time.  During my examination in the ER. Patient denied chest pain, shortness of breath, nausea, vomiting, dizziness or lightheadedness. Patient's wife at bedside.  Oe: BP 159/90, HR 65, o2 sat 98 % in room air.  NAD, lying on bed Resp: Clear bilateral, no wheezing or crackle Cardiovascular: Regular rate rhythm, S1-S2 normal. No murmur appreciated No extremity edema  Plan:  -Currently on aspirin, metoprolol, lisinopril and heparin IV for systemic anticoagulation -Trend troponin, nitroglycerin as needed for chest pain -order echocardiogram -As per medical record, cardiology was consulted overnight. Follow-up cardiologist evaluation and recommendation.  Addendum: further elevation in troponin level. Talked to Cardiology team at [facility] who reported that patient will be seen when he arrives there. Also discussed with Dr. Venora Maples in ER  -discussed with bed placement team twice  Will talk to Cardiologist.   Addendum:  Spoke with Dr. Oval Linsey, cardiologist at [facility]. Since there is no bed available, patient will be transfer from ER to ER.

## 2016-11-11 NOTE — ED Notes (Signed)
Pt CBG was 127 notified Erin Investment banker, corporate)

## 2016-11-11 NOTE — Progress Notes (Signed)
ADDENDUM: Given troponin in the 4.9 range at this time, will go ahead with full heparinization.  R and B discussed with him and his wife, and they would like to proceed with anticoagulation.  Orvan Falconer MD FACP. Hospitalist.

## 2016-11-11 NOTE — Progress Notes (Signed)
ANTICOAGULATION CONSULT NOTE - Preliminary  Pharmacy Consult for Heparin Indication: ACS/ NSTEMI  Allergies  Allergen Reactions  . Lipitor [Atorvastatin] Other (See Comments)    Myalgia  . Other Other (See Comments)    All Cholesterol Meds - "Don't agree with me"  . Pravastatin Other (See Comments)    Myalgia    Patient Measurements: Height: 5\' 8"  (172.7 cm) Weight: 212 lb (96.2 kg) IBW/kg (Calculated) : 68.4 Heparin Dosing Weight: 80kg   Vital Signs: Temp: 97.7 F (36.5 C) (12/07 2343) Temp Source: Oral (12/07 2343) BP: 156/83 (12/08 0700) Pulse Rate: 60 (12/08 0700)  Labs:  Recent Labs  11/10/16 2351 11/11/16 0359 11/11/16 0441  HGB 14.7  --  14.6  HCT 43.4  --  41.7  PLT 193  --  189  CREATININE 1.39*  --  1.37*  CKTOTAL 451*  --   --   TROPONINI 0.13* 4.91*  --    Estimated Creatinine Clearance: 57.2 mL/min (by C-G formula based on SCr of 1.37 mg/dL (H)).  Medical History: Past Medical History:  Diagnosis Date  . Bladder cancer ([place])   . CAD (coronary artery disease)    a. 06/2014: NSTEMI s/p PTCA of LCx, 60-80% residual in distal LAD, 70% prox small D1.   b. 02/06/15 NSTEMI  s/p successful PCI/DES to mid RCA  . Diabetes mellitus, type 2 ([place])   . Elevated PSA   . HLD (hyperlipidemia)   . HOH (hard of hearing)   . Hypertension     Medications:    Assessment: 70 yo to be transferred to [facility] for worsening NSTEMI  Goal of Therapy:  Heparin level 0.3-0.7   Plan:  Heparin 4000 unit bolus followed by 950 units/hr using adjusted BW. Preliminary review of pertinent patient information completed.  [facility] clinical pharmacist will complete review during morning rounds to assess the patient and finalize treatment regimen.  Vernie Ammons, RPH 11/11/2016,7:19 AM

## 2016-11-11 NOTE — ED Provider Notes (Signed)
He has arrived here from [facility], hospital, to see Dr. Oval Linsey. His troponin has been climbing. He is currently alert and calm. I asked his nurse, to contact Dr. Oval Linsey, to get them involved in his care, here, now.   Daleen Bo, MD 11/11/16 1235

## 2016-11-11 NOTE — ED Notes (Signed)
Pt. Arrived from [facility] for ED to ED transfer. CP since last night. 4/10 aching pain.

## 2016-11-11 NOTE — ED Notes (Signed)
CRITICAL VALUE ALERT  Critical value received:  Troponin - 0.13  Date of notification:  11/11/2016  Time of notification:  1232  Critical value read back: yes  Nurse who received alert:  LJS  MD notified (1st page):  Dr Christy Gentles  Time of first page:    MD notified (2nd page):  Time of second page:  Responding MD:  Dr Christy Gentles  Time MD responded:  [phone]

## 2016-11-11 NOTE — Interval H&P Note (Signed)
Cath Lab Visit (complete for each Cath Lab visit)  Clinical Evaluation Leading to the Procedure:   ACS: Yes.    Non-ACS:    Anginal Classification: CCS IV  Anti-ischemic medical therapy: Minimal Therapy (1 class of medications)  Non-Invasive Test Results: No non-invasive testing performed  Prior CABG: No previous CABG      History and Physical Interval Note:  11/11/2016 1:25 PM  Timothy [name]  has presented today for surgery, with the diagnosis of NSTEMI  The various methods of treatment have been discussed with the patient and family. After consideration of risks, benefits and other options for treatment, the patient has consented to  Procedure(s): Left Heart Cath and Coronary Angiography (N/A) as a surgical intervention .  The patient's history has been reviewed, patient examined, no change in status, stable for surgery.  I have reviewed the patient's chart and labs.  Questions were answered to the patient's satisfaction.     Larae Grooms

## 2016-11-11 NOTE — H&P (Signed)
History and Physical    Timothy [name] [id number] DOB: June 14, 1946 DOA: 11/10/2016  PCP: Glo Herring., MD  Patient coming from: Home.    Chief Complaint:  Left upper chest pain.   HPI: Timothy [name] is an 70 y.o. male with hx of bladder cancer s/p surgery and BCG tx, known CAD, s/p NSTEMI July 2015 s/p PTCA to LCx and PCI/DES to mid RCA, DM, HTN, intolerance to statin, presented to the ER with left sided upper chest pain which he thought he pulled a muscle, along with some epigastric discomfort.  He denied SOB, black stool or bloody stool.  He had hx of gross hematuria, but it was a year ago.  He stopped cigarettes about 5 years ago, and lives at home with his wife.  Evaluation in the ER showed Troponin of 0.13, with EKG showing no ST elevation or acute changes.  CXR is still pending at this time.  His UA showed microscopic hematuria.  His Cr was 1.4, and his Hb was 14.7.  EDP spoke with Dr Lucy Chris who is kind to offer seeing him in consultation.  Hospitalist was asked to admit him and transfer him to [facility].    ED Course:  See above.   Past Medical History:  Diagnosis Date  . Bladder cancer ([place])   . CAD (coronary artery disease)    a. 06/2014: NSTEMI s/p PTCA of LCx, 60-80% residual in distal LAD, 70% prox small D1.   b. 02/06/15 NSTEMI  s/p successful PCI/DES to mid RCA  . Diabetes mellitus, type 2 ([place])   . Elevated PSA   . HLD (hyperlipidemia)   . HOH (hard of hearing)   . Hypertension     Rewiew of Systems:  Constitutional: Negative for malaise, fever and chills. No significant weight loss or weight gain Eyes: Negative for eye pain, redness and discharge, diplopia, visual changes, or flashes of light. ENMT: Negative for ear pain, hoarseness, nasal congestion, sinus pressure and sore throat. No headaches; tinnitus, drooling, or problem swallowing. Cardiovascular: Negative for  palpitations, diaphoresis, dyspnea and peripheral edema. ; No orthopnea,  PND Respiratory: Negative for cough, hemoptysis, wheezing and stridor. No pleuritic chestpain. Gastrointestinal: Negative for nausea, vomiting, diarrhea, constipation, abdominal pain, melena, blood in stool, hematemesis, jaundice and rectal bleeding.    Genitourinary: Negative for frequency, dysuria, incontinence,flank pain and hematuria; Musculoskeletal: Negative for back pain and neck pain. Negative for swelling and trauma.;  Skin: . Negative for pruritus, rash, abrasions, bruising and skin lesion.; ulcerations Neuro: Negative for headache, lightheadedness and neck stiffness. Negative for weakness, altered level of consciousness , altered mental status, extremity weakness, burning feet, involuntary movement, seizure and syncope.  Psych: negative for anxiety, depression, insomnia, tearfulness, panic attacks, hallucinations, paranoia, suicidal or homicidal ideation   Past Surgical History:  Procedure Laterality Date  . CARDIAC CATHETERIZATION  02/06/2015   Procedure: CORONARY STENT INTERVENTION;  Surgeon: [name], MD;  Location: [facility] CATH LAB;  Service: Cardiovascular;;  Mid RCA  . CYSTOSCOPY W/ RETROGRADES N/A 05/05/2015   Procedure: CYSTOSCOPY WITH BILATERAL RETROGRADE PYELOGRAMS AND BLADDER BIOPSIES;  Surgeon: [name], MD;  Location: [facility] ORS;  Service: Urology;  Laterality: N/A;  . LEFT HEART CATHETERIZATION WITH CORONARY ANGIOGRAM N/A 06/17/2014   Procedure: LEFT HEART CATHETERIZATION WITH CORONARY ANGIOGRAM;  Surgeon: [name], MD;  Location: [facility] CATH LAB;  Service: Cardiovascular;  Laterality: N/A;  . LEFT HEART CATHETERIZATION WITH CORONARY ANGIOGRAM N/A 02/06/2015   Procedure: LEFT HEART CATHETERIZATION WITH CORONARY ANGIOGRAM;  Surgeon: [name], MD;  Location: [facility] CATH LAB;  Service: Cardiovascular;  Laterality: N/A;  . PROSTATE BIOPSY N/A 03/31/2014   Procedure: BIOPSY TRANSRECTAL ULTRASONIC PROSTATE (TUBP);  Surgeon: [name], MD;  Location: [facility];  Service: Urology;  Laterality: N/A;  . TRANSURETHRAL RESECTION OF BLADDER TUMOR N/A 03/14/2013   Procedure: TRANSURETHRAL RESECTION OF BLADDER TUMOR (TURBT);  Surgeon: [name], MD;  Location: [facility];  Service: Urology;  Laterality: N/A;  1 HR WITH MITOMYCIN INSTILLATION   . TRANSURETHRAL RESECTION OF BLADDER TUMOR N/A 09/30/2013   Procedure: TRANSURETHRAL RESECTION OF BLADDER TUMOR (TURBT);  Surgeon: [name], MD;  Location: [facility];  Service: Urology;  Laterality: N/A;  . TRANSURETHRAL RESECTION OF BLADDER TUMOR N/A 03/31/2014   Procedure: TRANSURETHRAL RESECTION OF BLADDER TUMOR (TURBT);  Surgeon: [name], MD;  Location: [facility];  Service: Urology;  Laterality: N/A;  . TRANSURETHRAL RESECTION OF BLADDER TUMOR N/A 05/31/2015   Procedure: Cystoscopy with clott evacuation and fulgeration of bleeders, retrograde pylegram;  Surgeon: [name], MD;  Location: [facility] ORS;  Service: Urology;  Laterality: N/A;     reports that he quit smoking about 23 years ago. His smoking use included Cigarettes. He has a 30.00 pack-year smoking history. He has never used smokeless tobacco. He reports that he does not drink alcohol or use drugs.  Allergies  Allergen Reactions  . Lipitor [Atorvastatin] Other (See Comments)    Myalgia  . Other Other (See Comments)    All Cholesterol Meds - "Don't agree with me"  . Pravastatin Other (See Comments)    Myalgia    Family History  Problem Relation Age of Onset  . Cancer Child   . Asthma Mother      Prior to Admission medications   Medication Sig Start Date End Date Taking? Authorizing Provider  aspirin 81 MG chewable tablet Chew 1 tablet (81 mg total) by mouth daily. 06/19/14   Almyra Deforest, PA  glimepiride (AMARYL) 2 MG tablet Take 2 mg by mouth daily.     Historical Provider, MD  lisinopril (PRINIVIL,ZESTRIL) 10 MG tablet Take 10 mg by mouth daily.    Historical  Provider, MD  metFORMIN (GLUCOPHAGE) 500 MG tablet Take 1 tablet (500 mg total) by mouth 2 (two) times daily with a meal. 02/09/15   Eileen Stanford, PA-C  metoprolol succinate (TOPROL-XL) 25 MG 24 hr tablet TAKE 1 TABLET BY MOUTH EVERY DAY 06/24/16   Lendon Colonel, NP  nitroGLYCERIN (NITROSTAT) 0.4 MG SL tablet Place 1 tablet (0.4 mg total) under the tongue every 5 (five) minutes x 3 doses as needed for chest pain. 02/07/15   Eileen Stanford, PA-C  Omega-3 Fatty Acids (FISH OIL) 1000 MG CAPS Take 4 capsules (4,000 mg total) by mouth daily. 07/14/15   Lendon Colonel, NP  Polyethyl Glycol-Propyl Glycol (SYSTANE OP) Place 1-2 drops into both eyes daily as needed (For dry eyes.).    Historical Provider, MD    Physical Exam: Vitals:   11/11/16 0000 11/11/16 0030 11/11/16 0144 11/11/16 0200  BP: 163/87 166/85 156/78 146/68  Pulse: 69 77 71 66  Resp: 14 16 16 14   Temp:      TempSrc:      SpO2: 98% 100% 97% 99%  Weight:      Height:          Constitutional: NAD, calm, comfortable Vitals:   11/11/16 0000 11/11/16 0030 11/11/16 0144 11/11/16 0200  BP: 163/87 166/85 156/78 146/68  Pulse: 69 77 71 66  Resp: 14 16 16 14   Temp:      TempSrc:      SpO2: 98% 100% 97% 99%  Weight:      Height:       Eyes: PERRL, lids and conjunctivae normal ENMT: Mucous membranes are moist. Posterior pharynx clear of any exudate or lesions.Normal dentition.  Neck: normal, supple, no masses, no thyromegaly Respiratory: clear to auscultation bilaterally, no wheezing, no crackles. Normal respiratory effort. No accessory muscle use.  Cardiovascular: Regular rate and rhythm, no murmurs / rubs / gallops. No extremity edema. 2+ pedal pulses. No carotid bruits.  Abdomen: no tenderness, no masses palpated. No hepatosplenomegaly. Bowel sounds positive.  Musculoskeletal: no clubbing / cyanosis. No joint deformity upper and lower extremities. Good ROM, no contractures. Normal muscle tone.  Skin: no rashes,  lesions, ulcers. No induration Neurologic: CN 2-12 grossly intact. Sensation intact, DTR normal. Strength 5/5 in all 4.  Psychiatric: Normal judgment and insight. Alert and oriented x 3. Normal mood.   Labs on Admission: I have personally reviewed following labs and imaging studies  CBC:  Recent Labs Lab 11/10/16 2351  WBC 7.6  NEUTROABS 5.0  HGB 14.7  HCT 43.4  MCV 92.5  PLT 0000000   Basic Metabolic Panel:  Recent Labs Lab 11/10/16 2351  NA 136  K 3.9  CL 103  CO2 23  GLUCOSE 160*  BUN 14  CREATININE 1.39*  CALCIUM 8.9   GFR: Estimated Creatinine Clearance: 56.4 mL/min (by C-G formula based on SCr of 1.39 mg/dL (H)). Liver Function Tests:  Recent Labs Lab 11/10/16 2351  AST 32  ALT 20  ALKPHOS 47  BILITOT 0.5  PROT 7.2  ALBUMIN 4.2    Recent Labs Lab 11/10/16 2351  LIPASE 30   Cardiac Enzymes:  Recent Labs Lab 11/10/16 2351  CKTOTAL 451*  TROPONINI 0.13*   Urine analysis:    Component Value Date/Time   COLORURINE YELLOW 11/11/2016 0033   APPEARANCEUR CLEAR 11/11/2016 0033   LABSPEC 1.015 11/11/2016 0033   PHURINE 6.0 11/11/2016 0033   GLUCOSEU 50 (A) 11/11/2016 0033   HGBUR SMALL (A) 11/11/2016 0033   BILIRUBINUR NEGATIVE 11/11/2016 0033   KETONESUR NEGATIVE 11/11/2016 0033   PROTEINUR NEGATIVE 11/11/2016 0033   NITRITE NEGATIVE 11/11/2016 0033   LEUKOCYTESUR NEGATIVE 11/11/2016 0033    EKG: Independently reviewed.   Assessment/Plan Principal Problem:   NSTEMI (non-ST elevated myocardial infarction) ([place]) Active Problems:   Diabetes mellitus, type 2 ([place])   Bladder cancer ([facility])   HOH (hard of hearing)   HLD (hyperlipidemia)   CAD (coronary artery disease)   Hematuria   PLAN:   NSTEMI:  Will continue him on his ASA, increase his fish oil to 4 grams per day, and increase his Betablocker to 50mg  of Toprol per day.  Will defer full heparin to cardiology, but if requires, it probably be safe for him to get full heparin, as his gross  hematuria was over a year ago, according to him.  Appreciate cardiology's help.  Will try to decrease his rate pressure product.  He has intolerance to statin.  Continue to cycle his troponins.  DM:  Will give carb modified diet, and continue with SSI PRN.  HTN:  SBP was at 150, increase Lopressor to 50mg  per day.  Hematuria:  Patient has hx of bladder cancer s/p surgery according to him.  He has been Tx with BCG.  UA still has  microscopic hematuria.    DVT prophylaxis: SubQ heparin. Code Status: FULL CODE.  Family Communication: None.  Disposition Plan: To [facility] for cardiology to see.  Consults called: Cardiology.  Admission status: Inpatient.    Maylea Soria MD FACP. [facility]  If 7PM-7AM, please contact night-coverage [email or web address] Password [email or web address]  11/11/2016, 2:58 AM

## 2016-11-11 NOTE — ED Notes (Signed)
CRITICAL VALUE ALERT  Critical value received:  Troponin 7.26  Date of notification:  11/11/16  Time of notification:  [id number]  Critical value read back:Yes.    Nurse who received alert:  Charmayne Sheer, RN  MD notified (1st page):  [place]

## 2016-11-11 NOTE — ED Notes (Signed)
CRITICAL VALUE ALERT  Critical value received:  Troponin 4.91  Date of notification:  11/11/2016      Time of notification:  0520  Critical value read back: yes  Nurse who received alert:  Natividad Brood, RN  MD notified (1st page):  Dr.Le  Time of first page: 0521    MD notified (2nd page):  Time of second page:  Responding MD:  Dr. Marin Comment  Time MD responded: [phone]

## 2016-11-11 NOTE — ED Provider Notes (Signed)
While awaiting transfer, patient had repeat troponin that is >4 Pt remains stable, no new complaints I spoke to Dr Orvan Falconer Due to worsening non-stemi, plan to give heparin (pt did not report Gross hematuria) as at this point benefit outweighs risk   CRITICAL CARE Performed by: Sharyon Cable Total critical care time: 31 minutes Critical care time was exclusive of separately billable procedures and treating other patients. Critical care was necessary to treat or prevent imminent or life-threatening deterioration. Critical care was time spent personally by me on the following activities: development of treatment plan with patient and/or surrogate as well as nursing, discussions with consultants, evaluation of patient's response to treatment, examination of patient, obtaining history from patient or surrogate, ordering and performing treatments and interventions, ordering and review of laboratory studies, ordering and review of radiographic studies, pulse oximetry and re-evaluation of patient's condition.    Ripley Fraise, MD 11/11/16 [phone]

## 2016-11-11 NOTE — ED Provider Notes (Signed)
+++++++++++++++++++++++++++++++++++++++++++++++++++++   CRITICAL CARE Performed by: Hoy Morn Total critical care time: 31 minutes Critical care time was exclusive of separately billable procedures and treating other patients. Critical care was necessary to treat or prevent imminent or life-threatening deterioration. Critical care was time spent personally by me on the following activities: development of treatment plan with patient and/or surrogate as well as nursing, discussions with consultants, evaluation of patient's response to treatment, examination of patient, obtaining history from patient or surrogate, ordering and performing treatments and interventions, ordering and review of laboratory studies, ordering and review of radiographic studies, pulse oximetry and re-evaluation of patient's condition.  +++++++++++++++++++++++++++++++++++++++++++++++++++++++++++  I spoke with Dr Oval Linsey, cardiology at [facility], who agrees with ER to ER transfer. Cardiology will see in the ER for likely urgent heart cath today. Still with some active pain. Morphine now. On heparin. [facility] to take patient secondary to lack of [facility] transport availability.   Pt and family updated.   [facility] hospitalist service informed  ER charge nurse and physician informed  Troponin I  Date Value Ref Range Status  11/11/2016 7.26 ([facility]) <0.03 ng/mL Final    Comment:      11/11/2016 4.91 ([facility]) <0.03 ng/mL Final    Comment:      11/10/2016 0.13 ([place]) <0.03 ng/mL Final    Comment:                        Jola Schmidt, MD 11/11/16 1139

## 2016-11-11 NOTE — ED Notes (Signed)
Cardiology at bedside.

## 2016-11-11 NOTE — ED Provider Notes (Signed)
10:59 AM I was informed of the patient's ongoing elevating troponin while in the emergency department awaiting a bed at [facility].  I personally evaluated the patient.  He continues to have mild arm and bilateral chest pain.  He states it is much better than it was last night.  EKG continues to be nonischemic.  This is likely non-ST elevation MI.  He'll likely need repeat heart catheterization the day especially in the light of increasing troponin levels.  He has a history of coronary artery disease status post percutaneous intervention to the circumflex, OM, RCA.Marland Kitchen  Last heart catheterization was in 2016 which is when he underwent successful PCI to the mid RCA.  He reports pain today is similar to his prior anginal symptoms.  He is on a heparin drip.  We'll continue to monitor the patient closely this time.  Nursing will contact the primary team will need to discuss the case with cardiology.  If there is some delay in hearing from the hospitalist I will personally contact cardiology to arrange transport to [facility] for likely need for repeat heart catheterization today   Jola Schmidt, MD 11/11/16 1103

## 2016-11-11 NOTE — H&P (Signed)
CARDIOLOGY HISTORY AND PHYSICAL   Patient ID: Timothy [name] MRN: [id number]  DOB/AGE: 1946-07-19 70 y.o. Admit date: 11/10/2016  Primary Care Physician: Timothy [name]., MD Primary Cardiologist: Timothy Dolly MD  Clinical Summary Timothy [name] is a 70 y.o.male with known history of multivessel coronary artery disease, status post PCI and drug-eluting stent to the mid right coronary artery (02/06/2015), significant stenosis of the distal AV groove circumflex and had previous PTCA (06/2014) to the site but it was not amenable to PCI. He was treated medically, hypertension, diabetes, who was in his usual state of health when he was at home last evening eating a pork chop. He began to have chest discomfort and pain in arms bilaterally. He states the pain lasted on and off all night long. As the pain was very similar to pain he had had prior to his PCI and in the setting of a prior MI he chose to present to the emergency room. Pain continued on arrival to the emergency room with mid chest pressure and bilateral arm pain.  He denies medical noncompliance but continues to have issues with dietary compliance.  EKG revealed sinus rhythm with borderline T-wave abnormalities in the precordial leads, nonspecific inferior T-wave changes, ventricular rate of 73 bpm. Troponin at 5:16 AM 4.91, 9:52 AM was 7.26. As result of elevated troponin and ongoing chest discomfort he was advised that the patient be transferred to [facility] for immediate cardiac catheterization.  Blood pressure on arrival to the emergency room 149/126 heart rate 73 respirations 19 he was afebrile O2 98%. Blood pressure rising during ER evaluation. Creatinine 1.37. He was not found to be anemic. Chest x-ray revealed no effusion CHF or pneumonia. He was treated with aspirin 324 mg, metoprolol 50 mg by mouth, lisinopril 10 mg by mouth, heparin bolus 4000 units and began on an IV drip, and morphine 4 mg IV 1.  Allergies  Allergen  Reactions  . Lipitor [Atorvastatin] Other (See Comments)    Myalgia  . Other Other (See Comments)    All Cholesterol Meds - "Don't agree with me"  . Pravastatin Other (See Comments)    Myalgia    Home Medications  (Not in a hospital admission)  Scheduled Medications . aspirin  81 mg Oral Daily  . insulin aspart  0-15 Units Subcutaneous TID WC  . insulin aspart  0-5 Units Subcutaneous QHS  . lisinopril  10 mg Oral Daily  . metoprolol succinate  50 mg Oral Daily  . omega-3 acid ethyl esters  2 g Oral BID  . sodium chloride flush  3 mL Intravenous Q12H     Infusions . heparin 950 Units/hr (11/11/16 0625)     PRN Medications  nitroGLYCERIN, polyvinyl alcohol  Past Medical History:  Diagnosis Date  . Bladder cancer ([place])   . CAD (coronary artery disease)    a. 06/2014: NSTEMI s/p PTCA of LCx, 60-80% residual in distal LAD, 70% prox small D1.   b. 02/06/15 NSTEMI  s/p successful PCI/DES to mid RCA  . Diabetes mellitus, type 2 ([place])   . Elevated PSA   . HLD (hyperlipidemia)   . HOH (hard of hearing)   . Hypertension     Past Surgical History:  Procedure Laterality Date  . CARDIAC CATHETERIZATION  02/06/2015   Procedure: CORONARY STENT INTERVENTION;  Surgeon: Timothy Man, MD;  Location: [facility] CATH LAB;  Service: Cardiovascular;;  Mid RCA  . CYSTOSCOPY W/ RETROGRADES N/A 05/05/2015   Procedure: CYSTOSCOPY WITH BILATERAL RETROGRADE  PYELOGRAMS AND BLADDER BIOPSIES;  Surgeon: Timothy Gallo, MD;  Location: [facility];  Service: Urology;  Laterality: N/A;  . LEFT HEART CATHETERIZATION WITH CORONARY ANGIOGRAM N/A 06/17/2014   Procedure: LEFT HEART CATHETERIZATION WITH CORONARY ANGIOGRAM;  Surgeon: Timothy Man, MD;  Location: [facility] CATH LAB;  Service: Cardiovascular;  Laterality: N/A;  . LEFT HEART CATHETERIZATION WITH CORONARY ANGIOGRAM N/A 02/06/2015   Procedure: LEFT HEART CATHETERIZATION WITH CORONARY ANGIOGRAM;  Surgeon: Timothy Man, MD;  Location: [facility] CATH LAB;  Service:  Cardiovascular;  Laterality: N/A;  . PROSTATE BIOPSY N/A 03/31/2014   Procedure: BIOPSY TRANSRECTAL ULTRASONIC PROSTATE (TUBP);  Surgeon: Timothy Gallo, MD;  Location: [facility];  Service: Urology;  Laterality: N/A;  . TRANSURETHRAL RESECTION OF BLADDER TUMOR N/A 03/14/2013   Procedure: TRANSURETHRAL RESECTION OF BLADDER TUMOR (TURBT);  Surgeon: Timothy Gallo, MD;  Location: [facility];  Service: Urology;  Laterality: N/A;  1 HR WITH MITOMYCIN INSTILLATION   . TRANSURETHRAL RESECTION OF BLADDER TUMOR N/A 09/30/2013   Procedure: TRANSURETHRAL RESECTION OF BLADDER TUMOR (TURBT);  Surgeon: Timothy Gallo, MD;  Location: [facility];  Service: Urology;  Laterality: N/A;  . TRANSURETHRAL RESECTION OF BLADDER TUMOR N/A 03/31/2014   Procedure: TRANSURETHRAL RESECTION OF BLADDER TUMOR (TURBT);  Surgeon: Timothy Gallo, MD;  Location: [facility];  Service: Urology;  Laterality: N/A;  . TRANSURETHRAL RESECTION OF BLADDER TUMOR N/A 05/31/2015   Procedure: Cystoscopy with clott evacuation and fulgeration of bleeders, retrograde pylegram;  Surgeon: Timothy Frock, MD;  Location: [facility];  Service: Urology;  Laterality: N/A;    Family History  Problem Relation Age of Onset  . Cancer Child   . Asthma Mother     Social History [name] reports that he quit smoking about 23 years ago. His smoking use included Cigarettes. He has a 30.00 pack-year smoking history. He has never used smokeless tobacco. [name] reports that he does not drink alcohol.  Review of Systems Otherwise reviewed and negative except as outlined.  Physical Examination Temp:  [97.7 F (36.5 C)] 97.7 F (36.5 C) (12/07 2343) Pulse Rate:  [59-78] 64 (12/08 1130) Resp:  [11-20] 16 (12/08 1130) BP: (144-170)/(68-126) 148/74 (12/08 1130) SpO2:  [97 %-100 %] 98 % (12/08 1130) Weight:  [212 lb (96.2 kg)] 212 lb (96.2 kg) (12/07 2246) No intake or output  data in the 24 hours ending 11/11/16 1146  Gen: Complaints of minimal chest pressure but bilateral arm pain. HEENT: Conjunctiva and lids normal, oropharynx clear with moist mucosa. Neck: Supple, no elevated JVP or carotid bruits, no thyromegaly. Lungs: Clear to auscultation, nonlabored breathing at rest. Cardiac: Regular rate and rhythm, no S3 or significant systolic murmur, no pericardial rub. Abdomen: Soft, nontender, no hepatomegaly, bowel sounds present, no guarding or rebound. Extremities: No pitting edema, distal pulses 2+. Skin: Warm and dry. Musculoskeletal: No kyphosis. Neuropsychiatric: Alert and oriented x3, affect grossly appropriate.   Lab Results  Basic Metabolic Panel:  Recent Labs Lab 11/10/16 2351 11/11/16 0441  NA 136  --   K 3.9  --   CL 103  --   CO2 23  --   GLUCOSE 160*  --   BUN 14  --   CREATININE 1.39* 1.37*  CALCIUM 8.9  --     Liver Function Tests:  Recent Labs Lab 11/10/16 2351  AST 32  ALT 20  ALKPHOS 47  BILITOT 0.5  PROT 7.2  ALBUMIN 4.2    CBC:  Recent Labs  Lab 11/10/16 2351 11/11/16 0441  WBC 7.6 8.5  NEUTROABS 5.0  --   HGB 14.7 14.6  HCT 43.4 41.7  MCV 92.5 92.5  PLT 193 189    Cardiac Enzymes:  Recent Labs Lab 11/10/16 2351 11/11/16 0359 11/11/16 0952  CKTOTAL 451*  --   --   TROPONINI 0.13* 4.91* 7.26*    Radiology Dg Chest 2 View  Result Date: 11/11/2016 CLINICAL DATA:  Central chest pain, pleuritic. EXAM: CHEST  2 VIEW COMPARISON:  02/06/2015 FINDINGS: Mild linear basilar opacities are accentuated by a shallow degree of inspiration. No confluent consolidation. No effusion. Normal pulmonary vasculature. Hilar, mediastinal and cardiac contours are unremarkable and unchanged. IMPRESSION: Mild linear atelectatic appearing opacities in the lung bases, accentuated by a shallow degree of inspiration. No confluent consolidation. No effusion. Electronically Signed   By: Andreas Newport M.D.   On: 11/11/2016 03:04     Prior Cardiac Testing/Procedures:  1.Cardiac Cath 06/17/2014 FINDINGS:  Hemodynamics:   Central Aortic Pressure / Mean: 108/62/83 mmHg  Left Ventricular Pressure / LVEDP: 107/7/17 mmHg  Left Ventriculography:  EF: ~45% %  Wall Motion: global Hypokinesis  Coronary Anatomy:  Dominance: Right  Left Main: Large caliber, long trunk that trifurcates into the LAD, Circumflex & Ramus Intermedius. Angiographically normal. LAD: Begins as a large caliber vessel that tapers into a relatively small caliber (~1.5-2.0 mm) vessel distally.  It wraps the apex, perfusing the distal 1/3 of the infero-apex. The distal vessel has tandem 60&70-80% focal lesions prior to the apex.  D1: Very small caliber bifurcating vessel with proximal ~70% stenosis.  Left Circumflex: Begins as a large caliber vessel that also tapers to a small to moderate caliber vessel in the AV Groove where it gives off a small caliber OM1 branch and is 100% occluded just beyond that point.    Post-PTCA: beyond the occluded AV Groove Circumflex, there is a small to moderate caliber LPL branch.  Ramus intermedius: Large caliber vessel that bifurcates in the mid-vessel into to small to moderate caliber branches that both reach the apex.   RCA: Large caliber, dominant vessel with diffuse ~20-40% lesions, but no obstructive disease.  It bifurcates distally in to a small caliber, short PDA and Right Posterior AV Groove Branch (RPAV) branches.  RPAV gives off several very small banches.  Percutaneous Coronary Intervention:   Guide: 6 Fr   CLS 3.5  -- very difficult guide support, catheter moved & disengaged with patient movement Guidewire: BMW (advanced initially in to an atrial branch for support & balloon advanced into proximal Circumflex allowing for further advancmemt of the wire beyond the culprit lesion. Predilation Balloon #1: Emerge 1.5 mm x 12 mm; Used to assist with wire support to advance guidewire ? 6 Atm x 30  Sec, 8 Atm x 30 Sec ? Post inflation revealed a small to moderate follow-on circumflex with LPL branch.   ? Due to the relatively small sized distal vessel, the decision was to perform PTCA only. Balloon #2: Euphora 2.0 mm x 12 mm;  ? 8 Atm x 90 Sec x 2 with IC NTG - no recoil ? Final Diameter: ~2.0 mm ?   2.  Echocardiogram7/14/2015 Left ventricle: The cavity size was normal. Wall thickness was normal. Systolic function was normal. The estimated ejection fraction was in the range of 50% to 55%. Inferior hypokinesis. Doppler parameters are consistent with abnormal left ventricular relaxation (grade 1 diastolic dysfunction). The E/e&' ratio is 15, suggesting borderline elevated LV filling pressure. -  Aortic valve: Trileaflet. Sclerosis without stenosis. There was no regurgitation. - Left atrium: The atrium was normal in size. - Tricuspid valve: There was mild regurgitation. - Pulmonary arteries: PA peak pressure: 33 mm Hg (S).  Impressions: LVEF 50-55%, inferior hypokinesis, mild TR, top normal RVSP, diastolic dysfunction with borderline elevated LV filling pressure.  ECG: Normal sinus rhythm with nonspecific T-wave flattening inferior lateral, heart rate 74 bpm.   Impression and Recommendations  1. NSTEMI: Patient presenting to the emergency room with bilateral arm pain in minimum chest pressure which began around 10 PM last night after eating pork chops. The patient states that the pain came and went throughout the night but remained fairly steady. Reminded him of pain he experiences with prior myocardial infarction. Troponin found to be elevated at 0.13, followed by 4.91 and 7.26 respectively. EKG revealing nonspecific T-wave abnormalities without acute ST elevation.  Patient is being transferred to [facility] for cardiac catheterization, this will be completed by Dr. Irish Lack, with accepting physician Dr. Skeet Latch. Post cardiac catheterization labs and  medications per [facility] heart care team. Risks benefits and description of procedure are given to the patient who verbalizes understanding and is willing to proceed.  2. Coronary artery disease: Known history of multivessel disease. Most recent PCI completed in July of 2015 with PTCA of the left circumflex occlusion. Patient also has a history of a drug-eluting stent. Patient has residual distal LAD disease. Follow-up catheterization 02/06/2015 with 95% stenosis of the mid RCA, PCI, with drug-eluting stent to mid RCA. He was found to have significant restenosis of the distal AV groove circumflex at previous PTCA site. Not amenable to PCI with stent.  3. Hypertension: Elevated today in the emergency room. The patient denies medical noncompliance stating he takes his medication every day. He continues to have issues with dietary compliance.  4. Diabetes: On metformin. Will need to be held post catheterization.  5. Hyperlipidemia: Continue Crestor.  Signed: Phill Myron. Maleki Hippe NP [place]  11/11/2016, 11:46 AM Co-Sign MD

## 2016-11-11 NOTE — Telephone Encounter (Signed)
Received call from [facility] re: Timothy [name].   Presented to [facility] with vague, intermittent epigastric pain which may or may not be his anginal equivalent. He has a history of CAD s/p PTCA of the Lcx, DES to [facility], with residual disease. Troponin was 0.13.   Notably, he was last admitted with hematuria due to bladder Ca that continued despite stopping Plavix. His current UA shows RBCs too numerous to count.   He is a poor candidate for DAPT or Heparin and as such PCI without first addressing his hematuria.   The patient and provider desire transfer to [facility].   He may be reasonable for the hospitalist service.    Allyson Sabal, MD

## 2016-11-11 NOTE — ED Provider Notes (Signed)
EKG Interpretation  Date/Time:  Friday November 11 2016 09:52:06 EST Ventricular Rate:  73 PR Interval:    QRS Duration: 99 QT Interval:  375 QTC Calculation: 414 R Axis:   71 Text Interpretation:  Sinus rhythm Low voltage, precordial leads Borderline T abnormalities, inferior leads nonspecific  inferior changes Confirmed by Breunna Nordmann  MD, Lennette Bihari ([id number]) on 11/11/2016 9:56:17 AM      Repeat troponin now    Jola Schmidt, MD 11/11/16 [phone]

## 2016-11-12 ENCOUNTER — Inpatient Hospital Stay (HOSPITAL_COMMUNITY): Payer: Medicare Other

## 2016-11-12 DIAGNOSIS — I219 Acute myocardial infarction, unspecified: Secondary | ICD-10-CM

## 2016-11-12 DIAGNOSIS — I10 Essential (primary) hypertension: Secondary | ICD-10-CM

## 2016-11-12 LAB — CBC
HEMATOCRIT: 42.1 % (ref 39.0–52.0)
Hemoglobin: 14.3 g/dL (ref 13.0–17.0)
MCH: 30.7 pg (ref 26.0–34.0)
MCHC: 34 g/dL (ref 30.0–36.0)
MCV: 90.3 fL (ref 78.0–100.0)
PLATELETS: 195 10*3/uL (ref 150–400)
RBC: 4.66 MIL/uL (ref 4.22–5.81)
RDW: 14.5 % (ref 11.5–15.5)
WBC: 7.9 10*3/uL (ref 4.0–10.5)

## 2016-11-12 LAB — BASIC METABOLIC PANEL
ANION GAP: 7 (ref 5–15)
BUN: 10 mg/dL (ref 6–20)
CALCIUM: 9 mg/dL (ref 8.9–10.3)
CO2: 26 mmol/L (ref 22–32)
CREATININE: 1.26 mg/dL — AB (ref 0.61–1.24)
Chloride: 105 mmol/L (ref 101–111)
GFR calc Af Amer: >60 mL/min (ref >60)
GFR, EST NON AFRICAN AMERICAN: 57 mL/min — AB (ref >60)
GLUCOSE: 116 mg/dL — AB (ref 65–99)
Potassium: 4 mmol/L (ref 3.5–5.1)
Sodium: 138 mmol/L (ref 135–145)

## 2016-11-12 LAB — LIPID PANEL
Cholesterol: 138 mg/dL (ref 0–200)
HDL: 25 mg/dL — ABNORMAL LOW (ref >40)
LDL CALC: 74 mg/dL (ref 0–99)
Total CHOL/HDL Ratio: 5.5 RATIO
Triglycerides: 197 mg/dL — ABNORMAL HIGH (ref <150)
VLDL: 39 mg/dL (ref 0–40)

## 2016-11-12 LAB — GLUCOSE, CAPILLARY
GLUCOSE-CAPILLARY: 87 mg/dL (ref 65–99)
Glucose-Capillary: 112 mg/dL — ABNORMAL HIGH (ref 65–99)
Glucose-Capillary: 91 mg/dL (ref 65–99)
Glucose-Capillary: 116 mg/dL — ABNORMAL HIGH (ref 65–99)

## 2016-11-12 LAB — ECHOCARDIOGRAM COMPLETE
HEIGHTINCHES: 68 in
Weight: 3403.9 oz

## 2016-11-12 LAB — HEMOGLOBIN A1C
Hgb A1c MFr Bld: 6.3 % — ABNORMAL HIGH (ref 4.8–5.6)
MEAN PLASMA GLUCOSE: 134 mg/dL

## 2016-11-12 MED ORDER — METOPROLOL SUCCINATE ER 50 MG PO TB24
75.0000 mg | ORAL_TABLET | Freq: Every day | ORAL | Status: DC
  Administered 2016-11-12 – 2016-11-13 (×2): 75 mg via ORAL
  Filled 2016-11-12 (×2): qty 1

## 2016-11-12 MED ORDER — LISINOPRIL 10 MG PO TABS: 20.0000 mg | ORAL_TABLET | Freq: Every day | ORAL | Status: DC

## 2016-11-12 MED ORDER — ANGIOPLASTY BOOK
Freq: Once | Status: DC
  Filled 2016-11-12: qty 1

## 2016-11-12 MED ORDER — LISINOPRIL 10 MG PO TABS
10.0000 mg | ORAL_TABLET | Freq: Every day | ORAL | Status: DC
  Administered 2016-11-12 – 2016-11-13 (×2): 10 mg via ORAL
  Filled 2016-11-12 (×2): qty 1

## 2016-11-12 MED ORDER — GLIMEPIRIDE 1 MG PO TABS
2.0000 mg | ORAL_TABLET | Freq: Every day | ORAL | Status: DC
  Administered 2016-11-12 – 2016-11-13 (×2): 2 mg via ORAL
  Filled 2016-11-12: qty 1
  Filled 2016-11-12: qty 2

## 2016-11-12 NOTE — Progress Notes (Signed)
CARDIAC REHAB PHASE I   PRE:  Rate/Rhythm: 79 SR  BP:  Supine: 142/73  Sitting:   Standing:    SaO2:   MODE:  Ambulation: 800 ft   POST:  Rate/Rhythm: 96 SR  BP:  Supine:   Sitting: 153/80  Standing:    SaO2: 98%RA 0745-0850 Pt walked 800 ft with steady gait and no CP. Tolerated well. MI education completed with pt and wife who voiced understanding. Gave pt diabetic and heart healthy diets and discussed carb counting. Reviewed NTG use, MI restrictions, ex ed and CRP 2. Will refer to [facility] program.   Graylon Good, RN BSN  11/12/2016 8:42 AM

## 2016-11-12 NOTE — Progress Notes (Signed)
Echocardiogram 2D Echocardiogram has been performed.  Timothy [name] 11/12/2016, 1:50 PM

## 2016-11-12 NOTE — Progress Notes (Addendum)
Report to [facility] via phone.  Patient transferred to [place] [facility] via wheelchair with staff NT and wife accompanying.

## 2016-11-12 NOTE — Progress Notes (Addendum)
Patient Name: Timothy [name] Date of Encounter: 11/12/2016  Primary Cardiologist: [facility] Problem List     Principal Problem:   NSTEMI (non-ST elevated myocardial infarction) [facility]) Active Problems:   Hypertension   Diabetes mellitus, type 2 ([place])   Bladder cancer ([facility])   HOH (hard of hearing)   HLD (hyperlipidemia)   CAD (coronary artery disease)   Hematuria     Subjective   No complaints  Inpatient Medications    Scheduled Meds: . angioplasty book   Does not apply Once  . aspirin  81 mg Oral Daily  . clopidogrel  300 mg Oral Once  . [START ON 11/13/2016] clopidogrel  75 mg Oral Q breakfast  . insulin aspart  0-15 Units Subcutaneous TID WC  . insulin aspart  0-5 Units Subcutaneous QHS  . lisinopril  10 mg Oral Daily  . metoprolol succinate  50 mg Oral Daily  . omega-3 acid ethyl esters  2 g Oral BID  . sodium chloride flush  3 mL Intravenous Q12H  . sodium chloride flush  3 mL Intravenous Q12H   Continuous Infusions:  PRN Meds: sodium chloride, acetaminophen, morphine injection, nitroGLYCERIN, ondansetron (ZOFRAN) IV, polyvinyl alcohol, sodium chloride flush   Vital Signs    Vitals:   11/11/16 2300 11/12/16 0142 11/12/16 0615 11/12/16 0732  BP: 117/60 (!) 153/71 125/78 (!) 143/73  Pulse: 66 66 71 72  Resp: 13 14 17 18   Temp:  97 F (36.1 C) 97.5 F (36.4 C) 97.7 F (36.5 C)  TempSrc:  Axillary Oral Oral  SpO2: 98% 98% 98% 97%  Weight:  212 lb 11.9 oz (96.5 kg)    Height:        Intake/Output Summary (Last 24 hours) at 11/12/16 0819 Last data filed at 11/12/16 0615  Gross per 24 hour  Intake              240 ml  Output             1100 ml  Net             -860 ml   Filed Weights   11/10/16 2246 11/12/16 0142  Weight: 212 lb (96.2 kg) 212 lb 11.9 oz (96.5 kg)    Physical Exam    GEN: Well nourished, well developed, in no acute distress.  HEENT: Grossly normal.  Neck: Supple, no JVD, carotid bruits, or masses. Cardiac: RRR,  no murmurs, rubs, or gallops. No clubbing, cyanosis, edema.  Radials/DP/PT 2+ and equal bilaterally.  Respiratory:  Respirations regular and unlabored, clear to auscultation bilaterally. GI: Soft, nontender, nondistended, BS + x 4. MS: no deformity or atrophy. Skin: warm and dry, no rash. Neuro:  Strength and sensation are intact. Psych: AAOx3.  Normal affect.  Labs    CBC  Recent Labs  11/10/16 2351 11/11/16 0441 11/12/16 0101  WBC 7.6 8.5 7.9  NEUTROABS 5.0  --   --   HGB 14.7 14.6 14.3  HCT 43.4 41.7 42.1  MCV 92.5 92.5 90.3  PLT 193 189 0000000   Basic Metabolic Panel  Recent Labs  11/10/16 2351 11/11/16 0441 11/12/16 0101  NA 136  --  138  K 3.9  --  4.0  CL 103  --  105  CO2 23  --  26  GLUCOSE 160*  --  116*  BUN 14  --  10  CREATININE 1.39* 1.37* 1.26*  CALCIUM 8.9  --  9.0   Liver Function Tests  Recent  Labs  11/10/16 2351  AST 32  ALT 20  ALKPHOS 47  BILITOT 0.5  PROT 7.2  ALBUMIN 4.2    Recent Labs  11/10/16 2351  LIPASE 30   Cardiac Enzymes  Recent Labs  11/10/16 2351 11/11/16 0359 11/11/16 0952  CKTOTAL 451*  --   --   TROPONINI 0.13* 4.91* 7.26*   BNP Invalid input(s): POCBNP D-Dimer No results for input(s): DDIMER in the last 72 hours. Hemoglobin A1C  Recent Labs  11/11/16 0441  HGBA1C 6.3*   Fasting Lipid Panel  Recent Labs  11/12/16 0101  CHOL 138  HDL 25*  LDLCALC 74  TRIG 197*  CHOLHDL 5.5   Thyroid Function Tests No results for input(s): TSH, T4TOTAL, T3FREE, THYROIDAB in the last 72 hours.  Invalid input(s): FREET3  Telemetry    NSR - Personally Reviewed  ECG    NSR - Personally Reviewed  Radiology    Dg Chest 2 View  Result Date: 11/11/2016 CLINICAL DATA:  Central chest pain, pleuritic. EXAM: CHEST  2 VIEW COMPARISON:  02/06/2015 FINDINGS: Mild linear basilar opacities are accentuated by a shallow degree of inspiration. No confluent consolidation. No effusion. Normal pulmonary vasculature.  Hilar, mediastinal and cardiac contours are unremarkable and unchanged. IMPRESSION: Mild linear atelectatic appearing opacities in the lung bases, accentuated by a shallow degree of inspiration. No confluent consolidation. No effusion. Electronically Signed   By: Andreas Newport M.D.   On: 11/11/2016 03:04    Cardiac Studies   Conclusion     There is mild left ventricular systolic dysfunction.  The left ventricular ejection fraction is 45-50% by visual estimate.  Mid RCA-2 lesion, 25 %stenosed.  Patent mid RCA stent.  Ost 2nd Diag to 2nd Diag lesion, 80 %stenosed. Unchanged from prior.  Dist LAD lesion, 60 %stenosed. Unchanged from prior.  Mid Cx to Dist Cx lesion, 100 %stenosed. This lesion was treated with balloon angioplasty with a 2.0 balloon. Several inflations were performed.  Post intervention, there is a 0% residual stenosis. The vessel was too small to be stented. There is also significant tortuosity in the proximal vessel.  LV end diastolic pressure is mildly elevated.     Patient Profile     70 y.o.male with known history of multivessel coronary artery disease, status post PCI and drug-eluting stent to the mid right coronary artery (02/06/2015), significant stenosis of the distal AV groove circumflex and had previous PTCA (06/2014) to the site but it was not amenable to PCI. He was treated medically, hypertension, diabetes, who was in his usual state of health when he was at home  eating a pork chop. He began to have chest discomfort and pain in arms bilaterally.  Troponin at 5:16 AM 4.91, 9:52 AM was 7.26. As result of elevated troponin and ongoing chest discomfort he was advised that the patient be transferred to [facility] for immediate cardiac catheterization.   Assessment & Plan    1.  NSTEMI with occluded LCx s/p PCI with balloon angioplasty but no stent.  Now on DAPT with ASA and Plavix.  Trop still rising at 7.26.  Will continue to monitor until trending  downward.  Continue BB.  CHeck 2D echo.  2.  ASCAD - multivessel - see cath above. 3.  HTN - BP borderline controlled on current meds. Continue BB and ACE I.  Increase Toprol to 75mg  daily for better BP control.  4.  Hyperlipidemia - he is statin intolerant.  Will need to be seen in lipid  clinic as outpt to consider [facility] drug.  5.  Type 2 DM - check HbA1C.  Metformin on hold. Restart Amaryl.  Continue SS Insulin.   Signed, Fransico Him, MD  11/12/2016, 8:19 AM

## 2016-11-13 LAB — GLUCOSE, CAPILLARY: GLUCOSE-CAPILLARY: 145 mg/dL — AB (ref 65–99)

## 2016-11-13 MED ORDER — LISINOPRIL 10 MG PO TABS: 10.0000 mg | ORAL_TABLET | Freq: Every day | ORAL | 6 refills | Status: DC

## 2016-11-13 MED ORDER — CLOPIDOGREL BISULFATE 75 MG PO TABS: 75.0000 mg | ORAL_TABLET | Freq: Every day | ORAL | 6 refills | Status: DC

## 2016-11-13 MED ORDER — METOPROLOL SUCCINATE ER 25 MG PO TB24: ORAL_TABLET | ORAL | 5 refills | Status: DC

## 2016-11-13 NOTE — Discharge Instructions (Signed)
Radial Site Care Introduction Refer to this sheet in the next few weeks. These instructions provide you with information about caring for yourself after your procedure. Your health care provider may also give you more specific instructions. Your treatment has been planned according to current medical practices, but problems sometimes occur. Call your health care provider if you have any problems or questions after your procedure. What can I expect after the procedure? After your procedure, it is typical to have the following:  Bruising at the radial site that usually fades within 1-2 weeks.  Blood collecting in the tissue (hematoma) that may be painful to the touch. It should usually decrease in size and tenderness within 1-2 weeks. Follow these instructions at home:  Take medicines only as directed by your health care provider.  You may shower 24-48 hours after the procedure or as directed by your health care provider. Remove the bandage (dressing) and gently wash the site with plain soap and water. Pat the area dry with a clean towel. Do not rub the site, because this may cause bleeding.  Do not take baths, swim, or use a hot tub until your health care provider approves.  Check your insertion site every day for redness, swelling, or drainage.  Do not apply powder or lotion to the site.  Do not flex or bend the affected arm for 24 hours or as directed by your health care provider.  Do not push or pull heavy objects with the affected arm for 24 hours or as directed by your health care provider.  Do not lift over 10 lb (4.5 kg) for 5 days after your procedure or as directed by your health care provider.  Ask your health care provider when it is okay to:  Return to work or school.  Resume usual physical activities or sports.  Resume sexual activity.  Do not drive home if you are discharged the same day as the procedure. Have someone else drive you.  You may drive 24 hours after the  procedure unless otherwise instructed by your health care provider.  Do not operate machinery or power tools for 24 hours after the procedure.  If your procedure was done as an outpatient procedure, which means that you went home the same day as your procedure, a responsible adult should be with you for the first 24 hours after you arrive home.  Keep all follow-up visits as directed by your health care provider. This is important. Contact a health care provider if:  You have a fever.  You have chills.  You have increased bleeding from the radial site. Hold pressure on the site. Get help right away if:  You have unusual pain at the radial site.  You have redness, warmth, or swelling at the radial site.  You have drainage (other than a small amount of blood on the dressing) from the radial site.  The radial site is bleeding, and the bleeding does not stop after 30 minutes of holding steady pressure on the site.  Your arm or hand becomes pale, cool, tingly, or numb. This information is not intended to replace advice given to you by your health care provider. Make sure you discuss any questions you have with your health care provider. Document Released: 12/24/2010 Document Revised: 04/28/2016 Document Reviewed: 06/09/2014  2017 Elsevier   Heart-Healthy Eating Plan Many factors influence your heart health, including eating and exercise habits. Heart (coronary) risk increases with abnormal blood fat (lipid) levels. Heart-healthy meal planning includes limiting  unhealthy fats, increasing healthy fats, and making other small dietary changes. This includes maintaining a healthy body weight to help keep lipid levels within a normal range. What is my plan? Your health care provider recommends that you:  Get no more than _________% of the total calories in your daily diet from fat.  Limit your intake of saturated fat to less than _________% of your total calories each day.  Limit the  amount of cholesterol in your diet to less than _________ mg per day. What types of fat should I choose?  Choose healthy fats more often. Choose monounsaturated and polyunsaturated fats, such as olive oil and canola oil, flaxseeds, walnuts, almonds, and seeds.  Eat more omega-3 fats. Good choices include salmon, mackerel, sardines, tuna, flaxseed oil, and ground flaxseeds. Aim to eat fish at least two times each week.  Limit saturated fats. Saturated fats are primarily found in animal products, such as meats, butter, and cream. Plant sources of saturated fats include palm oil, palm kernel oil, and coconut oil.  Avoid foods with partially hydrogenated oils in them. These contain trans fats. Examples of foods that contain trans fats are stick margarine, some tub margarines, cookies, crackers, and other baked goods. What general guidelines do I need to follow?  Check food labels carefully to identify foods with trans fats or high amounts of saturated fat.  Fill one half of your plate with vegetables and green salads. Eat 4-5 servings of vegetables per day. A serving of vegetables equals 1 cup of raw leafy vegetables,  cup of raw or cooked cut-up vegetables, or  cup of vegetable juice.  Fill one fourth of your plate with whole grains. Look for the word "whole" as the first word in the ingredient list.  Fill one fourth of your plate with lean protein foods.  Eat 4-5 servings of fruit per day. A serving of fruit equals one medium whole fruit,  cup of dried fruit,  cup of fresh, frozen, or canned fruit, or  cup of 100% fruit juice.  Eat more foods that contain soluble fiber. Examples of foods that contain this type of fiber are apples, broccoli, carrots, beans, peas, and barley. Aim to get 20-30 g of fiber per day.  Eat more home-cooked food and less restaurant, buffet, and fast food.  Limit or avoid alcohol.  Limit foods that are high in starch and sugar.  Avoid fried foods.  Cook  foods by using methods other than frying. Baking, boiling, grilling, and broiling are all great options. Other fat-reducing suggestions include:  Removing the skin from poultry.  Removing all visible fats from meats.  Skimming the fat off of stews, soups, and gravies before serving them.  Steaming vegetables in water or broth.  Lose weight if you are overweight. Losing just 5-10% of your initial body weight can help your overall health and prevent diseases such as diabetes and heart disease.  Increase your consumption of nuts, legumes, and seeds to 4-5 servings per week. One serving of dried beans or legumes equals  cup after being cooked, one serving of nuts equals 1 ounces, and one serving of seeds equals  ounce or 1 tablespoon.  You may need to monitor your salt (sodium) intake, especially if you have high blood pressure. Talk with your health care provider or dietitian to get more information about reducing sodium. What foods can I eat? Grains  Breads, including [place], white, pita, wheat, raisin, rye, oatmeal, and [place]. Tortillas that are neither fried nor  made with lard or trans fat. Low-fat rolls, including hotdog and hamburger buns and English muffins. Biscuits. Muffins. Waffles. Pancakes. Light popcorn. Whole-grain cereals. Flatbread. Melba toast. Pretzels. Breadsticks. Rusks. Low-fat snacks and crackers, including oyster, saltine, matzo, graham, animal, and rye. Rice and pasta, including brown rice and those that are made with whole wheat. Vegetables  All vegetables. Fruits  All fruits, but limit coconut. Meats and Other Protein Sources  Lean, well-trimmed beef, veal, pork, and lamb. Chicken and [place] without skin. All fish and shellfish. Wild duck, rabbit, pheasant, and venison. Egg whites or low-cholesterol egg substitutes. Dried beans, peas, lentils, and tofu.Seeds and most nuts. Dairy  Low-fat or nonfat cheeses, including ricotta, string, and mozzarella. Skim or 1% milk  that is liquid, powdered, or evaporated. Buttermilk that is made with low-fat milk. Nonfat or low-fat yogurt. Beverages  Mineral water. Diet carbonated beverages. Sweets and Desserts  Sherbets and fruit ices. Honey, jam, marmalade, jelly, and syrups. Meringues and gelatins. Pure sugar candy, such as hard candy, jelly beans, gumdrops, mints, marshmallows, and small amounts of dark chocolate. [employer] Eat all sweets and desserts in moderation. Fats and Oils  Nonhydrogenated (trans-free) margarines. Vegetable oils, including soybean, sesame, sunflower, olive, peanut, safflower, corn, canola, and cottonseed. Salad dressings or mayonnaise that are made with a vegetable oil. Limit added fats and oils that you use for cooking, baking, salads, and as spreads. Other  Cocoa powder. Coffee and tea. All seasonings and condiments. The items listed above may not be a complete list of recommended foods or beverages. Contact your dietitian for more options.  What foods are not recommended? Grains  Breads that are made with saturated or trans fats, oils, or whole milk. Croissants. Butter rolls. Cheese breads. Sweet rolls. Donuts. Buttered popcorn. Chow mein noodles. High-fat crackers, such as cheese or butter crackers. Meats and Other Protein Sources  Fatty meats, such as hotdogs, short ribs, sausage, spareribs, bacon, ribeye roast or steak, and mutton. High-fat deli meats, such as salami and bologna. Caviar. Domestic duck and goose. Organ meats, such as kidney, liver, sweetbreads, brains, gizzard, chitterlings, and heart. Dairy  Cream, sour cream, cream cheese, and creamed cottage cheese. Whole milk cheeses, including blue (bleu), Monte[place] Jack, [place], [place], American, [place], [place], [place], [place], and [place]. Whole or 2% milk that is liquid, evaporated, or condensed. Whole buttermilk. Cream sauce or high-fat cheese sauce. Yogurt that is made from whole milk. Beverages  Regular sodas and drinks  with added sugar. Sweets and Desserts  Frosting. Pudding. Cookies. Cakes other than angel food cake. Candy that has milk chocolate or white chocolate, hydrogenated fat, butter, coconut, or unknown ingredients. Buttered syrups. Full-fat ice cream or ice cream drinks. Fats and Oils  Gravy that has suet, meat fat, or shortening. Cocoa butter, hydrogenated oils, palm oil, coconut oil, palm kernel oil. These can often be found in baked products, candy, fried foods, nondairy creamers, and whipped toppings. Solid fats and shortenings, including bacon fat, salt pork, lard, and butter. Nondairy cream substitutes, such as coffee creamers and sour cream substitutes. Salad dressings that are made of unknown oils, cheese, or sour cream. The items listed above may not be a complete list of foods and beverages to avoid. Contact your dietitian for more information.  This information is not intended to replace advice given to you by your health care provider. Make sure you discuss any questions you have with your health care provider. Document Released: 08/30/2008 Document Revised: 06/10/2016 Document Reviewed: 05/15/2014 Elsevier Interactive Patient  Education  2017 [employer].

## 2016-11-13 NOTE — Discharge Summary (Signed)
Discharge Summary    Patient ID: Timothy [name],  MRN: [id number], DOB/AGE: [birth year] 70 y.o.  Admit date: 11/10/2016 Discharge date: 11/13/2016  Primary Care Provider: Glo Herring Primary Cardiologist: Dr. Harl Bowie  Discharge Diagnoses    Principal Problem:   NSTEMI (non-ST elevated myocardial infarction) [facility]) Active Problems:   Hypertension   Diabetes mellitus, type 2 ([place])   Bladder cancer (HCC)   HOH (hard of hearing)   HLD (hyperlipidemia)   CAD (coronary artery disease)   Hematuria   Allergies Allergies  Allergen Reactions  . Lipitor [Atorvastatin] Other (See Comments)    Myalgia  . Other Other (See Comments)    All Cholesterol Meds - causes muscle and joint pain  . Pravastatin Other (See Comments)    Myalgia    Diagnostic Studies/Procedures    Cath 11/11/16 Conclusion     There is mild left ventricular systolic dysfunction.  The left ventricular ejection fraction is 45-50% by visual estimate.  Mid RCA-2 lesion, 25 %stenosed.  Patent mid RCA stent.  Ost 2nd Diag to 2nd Diag lesion, 80 %stenosed. Unchanged from prior.  Dist LAD lesion, 60 %stenosed. Unchanged from prior.  Mid Cx to Dist Cx lesion, 100 %stenosed. This lesion was treated with balloon angioplasty with a 2.0 balloon. Several inflations were performed.  Post intervention, there is a 0% residual stenosis. The vessel was too small to be stented. There is also significant tortuosity in the proximal vessel.  LV end diastolic pressure is mildly elevated.   Echo 11/12/16 LV EF: 40% -   45%  ------------------------------------------------------------------- Indications:      MI - acute 410.91.  ------------------------------------------------------------------- History:   PMH:   Coronary artery disease.  PMH:   Myocardial infarction.  Risk factors:  Hypertension. Diabetes mellitus.  ------------------------------------------------------------------- Study  Conclusions  - Left ventricle: The cavity size was normal. Wall thickness was   normal. Systolic function was mildly to moderately reduced. The   estimated ejection fraction was in the range of 40% to 45%. There   is akinesis of the basal-midinferior and inferoseptal myocardium.   Doppler parameters are consistent with abnormal left ventricular   relaxation (grade 1 diastolic dysfunction). - Aortic valve: Trileaflet; mildly thickened, mildly calcified   leaflets. There was trivial regurgitation.  Impressions:  - EF is mildly reduced when compared to prior.   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 He was treated medically, hypertension, diabetes, who was in his usual state of health when he was at home @ evening of 11/10/16 eating a pork chop. He began to have chest discomfort and pain in arms bilaterally. He states the pain lasted on and off all night long. As the pain was very similar to pain he had had prior to his PCI and in the setting of a prior MI he chose to present to the emergency room. Pain continued on arrival to the emergency room with mid chest pressure and bilateral arm pain.  He denies medical noncompliance but continues to have issues with dietary compliance.  EKG revealed sinus rhythm with borderline T-wave abnormalities in the precordial leads, nonspecific inferior T-wave changes, ventricular rate of 73 bpm. Troponin at 5:16 AM 4.91, 9:52 AM was  7.26. As result of elevated troponin and ongoing chest discomfort he was advised that the patient be transferred to [facility] for immediate cardiac catheterization.  Blood pressure on arrival to the emergency room 149/126 heart rate  73 respirations 19 he was afebrile O2 98%. Blood pressure rising during ER evaluation. Creatinine 1.37. He was not found to be anemic. Chest x-ray revealed no effusion CHF or pneumonia. He was treated with aspirin 324 mg, metoprolol 50 mg by mouth, lisinopril 10 mg by mouth, heparin bolus 4000 units and began on an IV drip, and morphine 4 mg IV 1.  Hospital Course     Consultants: None  The admitted to [facility] and taken to cath lab. occluded LCx s/p PCI with balloon angioplasty but no stent as vessels is small.  Now on DAPT with ASA and Plavix. Echo this admit LVEF 40-45%, akinesis basal midinferior and inferoseptal walls. Grade I diastolic dysfunction. Will treat with medical therapy. Further titration at follow up. He is intolerance to statin, outpatient referral to lipid clinic. Ambulated well without chest pain.     The patient has been seen by Dr. Harl Bowie today and deemed ready for discharge home. All follow-up appointments have been scheduled. Discharge medications are listed below.   Discharge Vitals Blood pressure 102/68, pulse 65, temperature 98.2 F (36.8 C), temperature source Oral, resp. rate 18, height 5\' 8"  (1.727 m), weight 211 lb 8 oz (95.9 kg), SpO2 95 %.  Filed Weights   11/10/16 2246 11/12/16 0142 11/13/16 0500  Weight: 212 lb (96.2 kg) 212 lb 11.9 oz (96.5 kg) 211 lb 8 oz (95.9 kg)    Labs & Radiologic Studies     CBC  Recent Labs  11/10/16 2351 11/11/16 0441 11/12/16 0101  WBC 7.6 8.5 7.9  NEUTROABS 5.0  --   --   HGB 14.7 14.6 14.3  HCT 43.4 41.7 42.1  MCV 92.5 92.5 90.3  PLT 193 189 0000000   Basic Metabolic Panel  Recent Labs  11/10/16 2351 11/11/16 0441 11/12/16 0101  NA 136  --  138  K 3.9  --  4.0  CL 103  --  105  CO2 23  --  26  GLUCOSE 160*  --  116*  BUN 14  --  10  CREATININE 1.39* 1.37* 1.26*  CALCIUM 8.9  --  9.0   Liver Function Tests  Recent Labs  11/10/16 2351  AST 32  ALT 20  ALKPHOS 47  BILITOT 0.5  PROT 7.2  ALBUMIN 4.2     Recent Labs  11/10/16 2351  LIPASE 30   Cardiac Enzymes  Recent Labs  11/10/16 2351 11/11/16 0359 11/11/16 0952  CKTOTAL 451*  --   --   TROPONINI 0.13* 4.91* 7.26*   BNP Invalid input(s): POCBNP D-Dimer No results for input(s): DDIMER in the last 72 hours. Hemoglobin A1C  Recent Labs  11/11/16 0441  HGBA1C 6.3*   Fasting Lipid Panel  Recent Labs  11/12/16 0101  CHOL 138  HDL 25*  LDLCALC 74  TRIG 197*  CHOLHDL 5.5   Thyroid Function Tests No results for input(s): TSH, T4TOTAL, T3FREE, THYROIDAB in the last 72 hours.  Invalid input(s): FREET3  Dg Chest 2 View  Result Date: 11/11/2016 CLINICAL DATA:  Central chest pain, pleuritic. EXAM: CHEST  2 VIEW COMPARISON:  02/06/2015 FINDINGS: Mild linear basilar opacities are accentuated by a shallow degree of inspiration. No confluent consolidation. No effusion. Normal pulmonary vasculature. Hilar, mediastinal and cardiac contours are unremarkable and  unchanged. IMPRESSION: Mild linear atelectatic appearing opacities in the lung bases, accentuated by a shallow degree of inspiration. No confluent consolidation. No effusion. Electronically Signed   By: Andreas Newport M.D.   On: 11/11/2016 03:04    Disposition   Pt is being discharged home today in good condition.  Follow-up Plans & Appointments    Follow-up Information    Carlyle Dolly, MD Follow up.   Specialty:  Cardiology Why:  office will call with appintment date and time Contact information: [address] [place] [address] [phone]          Discharge Instructions    Amb Referral to Cardiac Rehabilitation    Complete by:  As directed    Diagnosis:   PTCA NSTEMI     Diet - low sodium heart healthy    Complete by:  As directed    Discharge instructions    Complete by:  As directed    No driving for 48 hours. No lifting over 5 lbs for 1 week. No sexual activity for 1 week. You may return to work on 11/17/16. Keep procedure  site clean & dry. If you notice increased pain, swelling, bleeding or pus, call/return!  You may shower, but no soaking baths/hot tubs/pools for 1 week.   Hold metformin today. You can resume tomorrow 11/14/16.   Increase activity slowly    Complete by:  As directed       Discharge Medications   Current Discharge Medication List    START taking these medications   Details  clopidogrel (PLAVIX) 75 MG tablet Take 1 tablet (75 mg total) by mouth daily with breakfast. Qty: 30 tablet, Refills: 6      CONTINUE these medications which have CHANGED   Details  lisinopril (PRINIVIL,ZESTRIL) 10 MG tablet Take 1 tablet (10 mg total) by mouth daily. Qty: 30 tablet, Refills: 6    metoprolol succinate (TOPROL-XL) 25 MG 24 hr tablet Take 3 tables total 75mg  dailty Qty: 90 tablet, Refills: 5      CONTINUE these medications which have NOT CHANGED   Details  aspirin 81 MG chewable tablet Chew 1 tablet (81 mg total) by mouth daily.    glimepiride (AMARYL) 2 MG tablet Take 2 mg by mouth daily.     metFORMIN (GLUCOPHAGE) 500 MG tablet Take 1 tablet (500 mg total) by mouth 2 (two) times daily with a meal. Qty: 60 tablet, Refills: 11    nitroGLYCERIN (NITROSTAT) 0.4 MG SL tablet Place 1 tablet (0.4 mg total) under the tongue every 5 (five) minutes x 3 doses as needed for chest pain. Qty: 25 tablet, Refills: 12    Omega-3 Fatty Acids (FISH OIL) 1000 MG CAPS Take 4 capsules (4,000 mg total) by mouth daily. Refills: 0    Polyethyl Glycol-Propyl Glycol (SYSTANE OP) Place 1-2 drops into both eyes daily as needed (dry eyes).          Aspirin prescribed at discharge?  Yes High Intensity Statin Prescribed? (Lipitor 40-80mg  or Crestor 20-40mg ): No: Intolerant to statin.  Beta Blocker Prescribed? Yes For EF 45% or less, Was ACEI/ARB Prescribed? Yes ADP Receptor Inhibitor Prescribed? (i.e. Plavix etc.-Includes Medically Managed Patients): Yes For EF <40%, Aldosterone Inhibitor Prescribed? N/A Was  EF assessed during THIS hospitalization? Yes Was Cardiac Rehab II ordered? (Included Medically managed Patients): Yes   Outstanding Labs/Studies   BMET at post hospital appointment.   Duration of Discharge Encounter   Greater than 30 minutes including physician time.  Signed, [employer]  PA-C 11/13/2016, 9:56 AM

## 2016-11-13 NOTE — Progress Notes (Signed)
Subjective:    No compalints  Objective:   Temp:  [97.9 F (36.6 C)-98.5 F (36.9 C)] 98.2 F (36.8 C) (12/10 0500) Pulse Rate:  [65-72] 65 (12/10 0500) Resp:  [17-19] 18 (12/10 0500) BP: (102-136)/(68-79) 102/68 (12/10 0500) SpO2:  [95 %-98 %] 95 % (12/10 0500) Weight:  [211 lb 8 oz (95.9 kg)] 211 lb 8 oz (95.9 kg) (12/10 0500) Last BM Date: 11/12/16  Filed Weights   11/10/16 2246 11/12/16 0142 11/13/16 0500  Weight: 212 lb (96.2 kg) 212 lb 11.9 oz (96.5 kg) 211 lb 8 oz (95.9 kg)    Intake/Output Summary (Last 24 hours) at 11/13/16 0857 Last data filed at 11/12/16 1850  Gross per 24 hour  Intake              240 ml  Output                0 ml  Net              240 ml    Telemetry: NSR  Exam:  General: NAD  HEENT: sclera clear, throat clear  Resp: CTAB  Cardiac: RRR, no m/r/g, no jvd  GI: abdomen soft, NT, ND  MSK:no LE edema  Neuro: no focal deficits  Psych: appropriate affect  Lab Results:  Basic Metabolic Panel:  Recent Labs Lab 11/10/16 2351 11/11/16 0441 11/12/16 0101  NA 136  --  138  K 3.9  --  4.0  CL 103  --  105  CO2 23  --  26  GLUCOSE 160*  --  116*  BUN 14  --  10  CREATININE 1.39* 1.37* 1.26*  CALCIUM 8.9  --  9.0    Liver Function Tests:  Recent Labs Lab 11/10/16 2351  AST 32  ALT 20  ALKPHOS 47  BILITOT 0.5  PROT 7.2  ALBUMIN 4.2    CBC:  Recent Labs Lab 11/10/16 2351 11/11/16 0441 11/12/16 0101  WBC 7.6 8.5 7.9  HGB 14.7 14.6 14.3  HCT 43.4 41.7 42.1  MCV 92.5 92.5 90.3  PLT 193 189 195    Cardiac Enzymes:  Recent Labs Lab 11/10/16 2351 11/11/16 0359 11/11/16 0952  CKTOTAL 451*  --   --   TROPONINI 0.13* 4.91* 7.26*    BNP: No results for input(s): PROBNP in the last 8760 hours.  Coagulation:  Recent Labs Lab 11/11/16 0441  INR 0.93    ECG:   Medications:   Scheduled Medications: . angioplasty book   Does not apply Once  . aspirin  81 mg Oral Daily  . clopidogrel  75 mg  Oral Q breakfast  . glimepiride  2 mg Oral Q breakfast  . insulin aspart  0-15 Units Subcutaneous TID WC  . insulin aspart  0-5 Units Subcutaneous QHS  . lisinopril  10 mg Oral Daily  . metoprolol succinate  75 mg Oral Daily  . omega-3 acid ethyl esters  2 g Oral BID  . sodium chloride flush  3 mL Intravenous Q12H  . sodium chloride flush  3 mL Intravenous Q12H     Infusions:   PRN Medications:  sodium chloride, acetaminophen, morphine injection, nitroGLYCERIN, ondansetron (ZOFRAN) IV, polyvinyl alcohol, sodium chloride flush     Assessment/Plan   1. CAD/LV systolic dysfunction - admitted with NSTEMI - cath 11/11/16, s/p balloon angioplasty to LCX, too small for stent.  - echo this admit LVEF 40-45%, akinesis basal midinferior and inferoseptal walls. Grade I diastolic dysfunction.  -  medical therapy with ASA, plavix, lisinopril, TOprol. Intolerant to statins. Can further titrate at f/u   2. Hyperlipidemia - intolerant to statins, will need referral to lipid clinic at outpatient f/u  3. DM2 - oral agents on hold during admission, resume at discharge.   [place] for discharge today. WIll need outpatient f/u 2 weeks.    Carlyle Dolly, M.D., F.A.C.C.Patient ID: Timothy [name], male   DOB: [birth year], 70 y.o.   MRN: [id number]

## 2016-11-14 ENCOUNTER — Encounter (HOSPITAL_COMMUNITY): Payer: Self-pay | Admitting: Interventional Cardiology

## 2016-11-14 MED FILL — Heparin Sodium (Porcine) 2 Unit/ML in Sodium Chloride 0.9%: INTRAMUSCULAR | Qty: 500 | Status: AC

## 2016-11-18 ENCOUNTER — Other Ambulatory Visit: Payer: Self-pay | Admitting: Physician Assistant

## 2016-11-21 ENCOUNTER — Other Ambulatory Visit: Payer: Self-pay | Admitting: Adult Health

## 2016-11-22 ENCOUNTER — Ambulatory Visit (INDEPENDENT_AMBULATORY_CARE_PROVIDER_SITE_OTHER): Payer: Medicare Other | Admitting: Urology

## 2016-11-22 ENCOUNTER — Encounter (HOSPITAL_COMMUNITY)
Admission: AD | Admit: 2016-11-22 | Discharge: 2016-11-22 | Disposition: A | Discharge status: Home / Self Care | Payer: Medicare Other | Source: Skilled Nursing Facility | Attending: Urology | Admitting: Urology

## 2016-11-22 ENCOUNTER — Encounter: Payer: Self-pay | Admitting: Cardiology

## 2016-11-22 ENCOUNTER — Ambulatory Visit (INDEPENDENT_AMBULATORY_CARE_PROVIDER_SITE_OTHER): Payer: Medicare Other | Admitting: Cardiology

## 2016-11-22 ENCOUNTER — Encounter: Payer: Self-pay | Admitting: *Deleted

## 2016-11-22 VITALS — BP 155/91 | HR 75 | Ht 68.0 in | Wt 214.0 lb

## 2016-11-22 DIAGNOSIS — E782 Mixed hyperlipidemia: Secondary | ICD-10-CM

## 2016-11-22 DIAGNOSIS — I10 Essential (primary) hypertension: Secondary | ICD-10-CM

## 2016-11-22 DIAGNOSIS — R300 Dysuria: Secondary | ICD-10-CM

## 2016-11-22 DIAGNOSIS — C674 Malignant neoplasm of posterior wall of bladder: Secondary | ICD-10-CM | POA: Diagnosis not present

## 2016-11-22 DIAGNOSIS — I2510 Atherosclerotic heart disease of native coronary artery without angina pectoris: Secondary | ICD-10-CM

## 2016-11-22 DIAGNOSIS — Z136 Encounter for screening for cardiovascular disorders: Secondary | ICD-10-CM

## 2016-11-22 DIAGNOSIS — C61 Malignant neoplasm of prostate: Secondary | ICD-10-CM

## 2016-11-22 MED ORDER — LISINOPRIL 10 MG PO TABS: 10.0000 mg | ORAL_TABLET | Freq: Every day | ORAL | 6 refills | Status: DC

## 2016-11-22 NOTE — Patient Instructions (Signed)
Your physician recommends that you schedule a follow-up appointment in: 3 MONTHS WITH DR. [place]   Your physician has recommended you make the following change in your medication:  RESTART LISINOPRIL 10 MG DAILY  Your physician has requested that you have an abdominal aorta duplex. During this test, an ultrasound is used to evaluate the aorta. Allow 30 minutes for this exam. Do not eat after midnight the day before and avoid carbonated beverages  Thank you for choosing [facility]!!

## 2016-11-22 NOTE — Progress Notes (Signed)
Clinical Summary Timothy [name] is a 70 y.o.male seen today for follow up of the medical problems.   1. CAD - recent NSTEMI 06/2014, received PTCA of circumflex as described below. Echo LVEF 50-55%, inferior hypokinesis, grade I diastolic dysfunction.  - recent admit 11/2016 with NSTEMI. Cath as reported below. Received balloon angioplasty of mid to distal LCX, too small to be stented.  - echo 11/2016 LVEF 40-45%.  - denies any chest pain, no SOB - compliant with meds. Unclear what happened to lisinopril   2. HTN - does not check regularly at home - compliant with meds  3. DM Followed by pcp  4. Hyperlipidemia - intolerant to statins, has been on zetia only. - 11/2016: TC 138 TG 197 HDL 25 LDL 74   5. Leg pains - bilateral calf pain now resolved during last pcp visit. Since that time he reports it has completely resolved.   6. AAA screen - male over 80 with tobacco history  Past Medical History:  Diagnosis Date  . Arthritis   . Bladder cancer ([place])   . CAD (coronary artery disease)    a. 06/2014: NSTEMI s/p PTCA of LCx, 60-80% residual in distal LAD, 70% prox small D1.   b. 02/06/15 NSTEMI  s/p successful PCI/DES to mid RCA  . Diabetes mellitus, type 2 ([place])   . Elevated PSA   . HLD (hyperlipidemia)   . HOH (hard of hearing)   . Hypertension   . NSTEMI (non-ST elevated myocardial infarction) ([facility])      Allergies  Allergen Reactions  . Lipitor [Atorvastatin] Other (See Comments)    Myalgia  . Other Other (See Comments)    All Cholesterol Meds - causes muscle and joint pain  . Pravastatin Other (See Comments)    Myalgia     Current Outpatient Prescriptions  Medication Sig Dispense Refill  . aspirin 81 MG chewable tablet Chew 1 tablet (81 mg total) by mouth daily.    . clopidogrel (PLAVIX) 75 MG tablet Take 1 tablet (75 mg total) by mouth daily with breakfast. 30 tablet 6  . glimepiride (AMARYL) 2 MG tablet Take 2 mg by mouth daily.     Marland Kitchen lisinopril  (PRINIVIL,ZESTRIL) 10 MG tablet Take 1 tablet (10 mg total) by mouth daily. 30 tablet 6  . metFORMIN (GLUCOPHAGE) 500 MG tablet Take 1 tablet (500 mg total) by mouth 2 (two) times daily with a meal. 60 tablet 11  . metoprolol succinate (TOPROL-XL) 25 MG 24 hr tablet Take 3 tables total 20m dailty 90 tablet 5  . metoprolol succinate (TOPROL-XL) 25 MG 24 hr tablet TAKE 1 TABLET BY MOUTH EVERY DAY 30 tablet 6  . NITROSTAT 0.4 MG SL tablet DISSOLVE 1 TABLET UNDER TONGUE EVERY 5 MINS FOR 3 DOSES AS NEEDED FOR CHEST PAIN 25 tablet 3  . Omega-3 Fatty Acids (FISH OIL) 1000 MG CAPS Take 4 capsules (4,000 mg total) by mouth daily. (Patient taking differently: Take 1,000 mg by mouth daily. )  0  . Polyethyl Glycol-Propyl Glycol (SYSTANE OP) Place 1-2 drops into both eyes daily as needed (dry eyes).      No current facility-administered medications for this visit.      Past Surgical History:  Procedure Laterality Date  . CARDIAC CATHETERIZATION  02/06/2015   Procedure: CORONARY STENT INTERVENTION;  Surgeon: DLeonie Man MD;  [facility];  Service: Cardiovascular;;  Mid RCA  . CARDIAC CATHETERIZATION N/A 11/11/2016   Procedure: Left Heart Cath  and Coronary Angiography;  Surgeon: [name], MD;  Location: [facility] CV LAB;  Service: Cardiovascular;  Laterality: N/A;  . CARDIAC CATHETERIZATION N/A 11/11/2016   Procedure: Coronary Balloon Angioplasty;  Surgeon: [name], MD;  Location: [facility] CV LAB;  Service: Cardiovascular;  Laterality: N/A;  . CORONARY ANGIOPLASTY  11/11/2016   Mid Cx to Dist Cx lesion, 100 %stenosed. This lesion was treated with balloon angioplasty with a 2.0 balloon  . CYSTOSCOPY W/ RETROGRADES N/A 05/05/2015   Procedure: CYSTOSCOPY WITH BILATERAL RETROGRADE PYELOGRAMS AND BLADDER BIOPSIES;  Surgeon: [name], MD;  Location: [facility];  Service: Urology;  Laterality: N/A;  . LEFT HEART CATHETERIZATION WITH CORONARY ANGIOGRAM N/A 06/17/2014    Procedure: LEFT HEART CATHETERIZATION WITH CORONARY ANGIOGRAM;  Surgeon: [name], MD;  [facility] C[facility] LAB;  Service: Cardiovascular;  Laterality: N/A;  . LEFT HEART CATHETERIZATION WITH CORONARY ANGIOGRAM N/A 02/06/2015   Procedure: LEFT HEART CATHETERIZATION WITH CORONARY ANGIOGRAM;  Surgeon: [name], MD;  Location: [facility] CATH LAB;  Service: Cardiovascular;  Laterality: N/A;  . PROSTATE BIOPSY N/A 03/31/2014   Procedure: BIOPSY TRANSRECTAL ULTRASONIC PROSTATE (TUBP);  Surgeon: [name], MD;  Location: [facility];  Service: Urology;  Laterality: N/A;  . TRANSURETHRAL RESECTION OF BLADDER TUMOR N/A 03/14/2013   Procedure: TRANSURETHRAL RESECTION OF BLADDER TUMOR (TURBT);  Surgeon: [name], MD;  [facility];  Service: Urology;  Laterality: N/A;  1 HR WITH MITOMYCIN INSTILLATION   . TRANSURETHRAL RESECTION OF BLADDER TUMOR N/A 09/30/2013   Procedure: TRANSURETHRAL RESECTION OF BLADDER TUMOR (TURBT);  Surgeon: [name], MD;  Location: [facility];  Service: Urology;  Laterality: N/A;  . TRANSURETHRAL RESECTION OF BLADDER TUMOR N/A 03/31/2014   Procedure: TRANSURETHRAL RESECTION OF BLADDER TUMOR (TURBT);  Surgeon: [name], MD;  Location: [facility];  Service: Urology;  Laterality: N/A;  . TRANSURETHRAL RESECTION OF BLADDER TUMOR N/A 05/31/2015   Procedure: Cystoscopy with clott evacuation and fulgeration of bleeders, retrograde pylegram;  Surgeon: [name], MD;  Location: [facility];  Service: Urology;  Laterality: N/A;     Allergies  Allergen Reactions  . Lipitor [Atorvastatin] Other (See Comments)    Myalgia  . Other Other (See Comments)    All Cholesterol Meds - causes muscle and joint pain  . Pravastatin Other (See Comments)    Myalgia      Family History  Problem Relation Age of Onset  . Cancer Child   . Asthma Mother      Social History [name] reports  that he quit smoking about 23 years ago. His smoking use included Cigarettes. He has a 30.00 pack-year smoking history. He has never used smokeless tobacco. Mr. Baranowski reports that he does not drink alcohol.   Review of Systems CONSTITUTIONAL: No weight loss, fever, chills, weakness or fatigue.  HEENT: Eyes: No visual loss, blurred vision, double vision or yellow sclerae.No hearing loss, sneezing, congestion, runny nose or sore throat.  SKIN: No rash or itching.  CARDIOVASCULAR: per HPI RESPIRATORY: No shortness of breath, cough or sputum.  GASTROINTESTINAL: No anorexia, nausea, vomiting or diarrhea. No abdominal pain or blood.  GENITOURINARY: No burning on urination, no polyuria NEUROLOGICAL: No headache, dizziness, syncope, paralysis, ataxia, numbness or tingling in the extremities. No change in bowel or bladder control.  MUSCULOSKELETAL: No muscle, back pain, joint pain or stiffness.  LYMPHATICS: No enlarged nodes. No history of splenectomy.  PSYCHIATRIC: No history of depression or  anxiety.  ENDOCRINOLOGIC: No reports of sweating, cold or heat intolerance. No polyuria or polydipsia.  Marland Kitchen   Physical Examination Vitals:   11/22/16 0923  BP: (!) 155/91  Pulse: 75   Vitals:   11/22/16 0923  Weight: 214 lb (97.1 kg)  Height: '5\' 8"'  (1.727 m)    Gen: resting comfortably, no acute distress HEENT: no scleral icterus, pupils equal round and reactive, no palptable cervical adenopathy,  CV: RRR, no m/r/g, no jvd Resp: Clear to auscultation bilaterally GI: abdomen is soft, non-tender, non-distended, normal bowel sounds, no hepatosplenomegaly MSK: extremities are warm, no edema.  Skin: warm, no rash Neuro:  no focal deficits Psych: appropriate affect   Diagnostic Studies 06/2014 Cath FINDINGS:  Hemodynamics:   Central Aortic Pressure / Mean: 108/62/83 mmHg  Left Ventricular Pressure / LVEDP: 107/7/17 mmHg  Left Ventriculography:  EF: ~45% %  Wall Motion: global  Hypokinesis  Coronary Anatomy:  Dominance: Right  Left Main: Large caliber, long trunk that trifurcates into the LAD, Circumflex & Ramus Intermedius. Angiographically normal. LAD: Begins as a large caliber vessel that tapers into a relatively small caliber (~1.5-2.0 mm) vessel distally. It wraps the apex, perfusing the distal 1/3 of the infero-apex. The distal vessel has tandem 60&70-80% focal lesions prior to the apex.  D1: Very small caliber bifurcating vessel with proximal ~70% stenosis.  Left Circumflex: Begins as a large caliber vessel that also tapers to a small to moderate caliber vessel in the AV Groove where it gives off a small caliber OM1 Mauri Tolen and is 100% occluded just beyond that point.   Post-PTCA: beyond the occluded AV Groove Circumflex, there is a small to moderate caliber LPL Kumar Falwell.  Ramus intermedius: Large caliber vessel that bifurcates in the mid-vessel into to small to moderate caliber branches that both reach the apex.   RCA: Large caliber, dominant vessel with diffuse ~20-40% lesions, but no obstructive disease. It bifurcates distally in to a small caliber, short PDA and Right Posterior AV Groove Dyllen Menning (RPAV) branches. RPAV gives off several very small banches.  After reviewing the initial angiography, the culprit lesion was thought to be the occluded distal-AV Groove circumflesx. Preparation were made to proceed with PTCA on this lesion.  Percutaneous Coronary Intervention:  Guide: 6 Fr CLS 3.5 -- very difficult guide support, catheter moved & disengaged with patient movement Guidewire: BMW (advanced initially in to an atrial Emanii Bugbee for support & balloon advanced into proximal Circumflex allowing for further advancmemt of the wire beyond the culprit lesion. Predilation Balloon #1: Emerge 1.5 mm x 12 mm; Used to assist with wire support to advance guidewire ? 6 Atm x 30 Sec, 8 Atm x 30 Sec ? Post inflation revealed a small to moderate follow-on  circumflex with LPL Tyffani Foglesong.  ? Due to the relatively small sized distal vessel, the decision was to perform PTCA only. Balloon #2: Euphora 2.0 mm x 12 mm;  ? 8 Atm x 90 Sec x 2 with IC NTG - no recoil ? Final Diameter: ~2.0 mm  Post deployment angiography in multiple views, with and without guidewire in place revealed excellent stent deployment and lesion coverage. There was no evidence of dissection or perforation.  POST-OPERATIVE DIAGNOSIS:   Severe 2 vessel disease in small caliber vessel - distal AV Groove Circumflex 100% and distal LAD ~60&80% lesions (1.5 mm vessel)  Successful PTCA of the occluded AV Groove Circumflex with Stent-like result.  Moderately reduced LVEF by LV Gram  Normal LVEDP   06/2014 Echo Study  Conclusions  - Left ventricle: The cavity size was normal. Wall thickness was normal. Systolic function was normal. The estimated ejection fraction was in the range of 50% to 55%. Inferior hypokinesis. Doppler parameters are consistent with abnormal left ventricular relaxation (grade 1 diastolic dysfunction). The E/e&' ratio is 15, suggesting borderline elevated LV filling pressure. - Aortic valve: Trileaflet. Sclerosis without stenosis. There was no regurgitation. - Left atrium: The atrium was normal in size. - Tricuspid valve: There was mild regurgitation. - Pulmonary arteries: PA peak pressure: 33 mm Hg (S).  Impressions:  - LVEF 50-55%, inferior hypokinesis, mild TR, top normal RVSP, diastolic dysfunction with borderline elevated LV filling pressure.   11/2016 echo Study Conclusions  - Left ventricle: The cavity size was normal. Wall thickness was   normal. Systolic function was mildly to moderately reduced. The   estimated ejection fraction was in the range of 40% to 45%. There   is akinesis of the basal-midinferior and inferoseptal myocardium.   Doppler parameters are consistent with abnormal left ventricular   relaxation  (grade 1 diastolic dysfunction). - Aortic valve: Trileaflet; mildly thickened, mildly calcified   leaflets. There was trivial regurgitation.  Impressions:  - EF is mildly reduced when compared to prior.   11/2016 cath  There is mild left ventricular systolic dysfunction.  The left ventricular ejection fraction is 45-50% by visual estimate.  Mid RCA-2 lesion, 25 %stenosed.  Patent mid RCA stent.  Ost 2nd Diag to 2nd Diag lesion, 80 %stenosed. Unchanged from prior.  Dist LAD lesion, 60 %stenosed. Unchanged from prior.  Mid Cx to Dist Cx lesion, 100 %stenosed. This lesion was treated with balloon angioplasty with a 2.0 balloon. Several inflations were performed.  Post intervention, there is a 0% residual stenosis. The vessel was too small to be stented. There is also significant tortuosity in the proximal vessel.  LV end diastolic pressure is mildly elevated.   Initially, Brilinta was given for antiplatelet therapy in the Cath Lab. Since he did not receive a stent, we will transition him to clopidogrel.  He needs aggressive secondary prevention including lipid-lowering therapy. He has been intolerant to statins in the past. Would need to consider PCS K9 inhibitor along with other secondary prevention. I anticipate he'll be here 2 days in the hospital. Echocardiogram would be reasonable to check ejection fraction and mitral apparatus.  LAD and diagonal disease was not addressed since they appeared unchanged angiographically. If he has refractory angina, could address the LAD lesion at a later time.  Assessment and Plan   1. CAD - denies any current symptoms - he will continue current meds  2. HTN - elevated in clinic. Start lisinopril 74m daily.   3. Hyperlipidemia - side effects to atrorva and prava. His lipid panel actually looks pretty good without therapy. There is no indication for trial of pcsk-9 inhibitor given his normal LDL   4. Leg pains - resolved at this  time, unclear etiology. Continue to monitor at this time.   5. AAA screen - male over 89with smoking history, order AAA screen   JArnoldo Lenis M.D.

## 2016-11-25 LAB — URINE CULTURE

## 2016-12-05 DIAGNOSIS — I8290 Acute embolism and thrombosis of unspecified vein: Secondary | ICD-10-CM

## 2016-12-05 HISTORY — DX: Acute embolism and thrombosis of unspecified vein: I82.90

## 2016-12-14 ENCOUNTER — Ambulatory Visit: Payer: Medicare Other

## 2016-12-14 ENCOUNTER — Telehealth: Payer: Self-pay | Admitting: *Deleted

## 2016-12-14 DIAGNOSIS — Z136 Encounter for screening for cardiovascular disorders: Secondary | ICD-10-CM | POA: Diagnosis not present

## 2016-12-14 NOTE — Telephone Encounter (Signed)
Hold lisinopril 1 week, and call us back with update   J Amarie Tarte MD

## 2016-12-14 NOTE — Telephone Encounter (Signed)
Pt here for [place] says since restarting lisinopril having cramps/aching in both arms and both legs. Pt had medication with him and also had diclofenac 75 mg started by orthopedic dr that we didn't have on current medication list. Pt thinks medication is causing muscle aches - pt will also contact Dr Berline Lopes (ortho) - routed to Dr. Harl Bowie

## 2016-12-15 NOTE — Telephone Encounter (Signed)
Pt aware - will call back next week with update on muscle aches

## 2016-12-20 ENCOUNTER — Telehealth: Payer: Self-pay | Admitting: *Deleted

## 2016-12-20 NOTE — Telephone Encounter (Signed)
-----   Message from Arnoldo Lenis, MD sent at 12/20/2016  3:51 PM EST ----- Normal abdominal US, no evidence of aneurysm  J BrancH MD

## 2016-12-20 NOTE — Telephone Encounter (Signed)
Pt aware - routed to pcp  

## 2016-12-29 DIAGNOSIS — M1991 Primary osteoarthritis, unspecified site: Secondary | ICD-10-CM | POA: Diagnosis not present

## 2016-12-29 DIAGNOSIS — M25512 Pain in left shoulder: Secondary | ICD-10-CM | POA: Diagnosis not present

## 2017-02-06 ENCOUNTER — Other Ambulatory Visit (INDEPENDENT_AMBULATORY_CARE_PROVIDER_SITE_OTHER): Payer: Self-pay | Admitting: Internal Medicine

## 2017-02-14 ENCOUNTER — Ambulatory Visit: Payer: Medicare Other | Admitting: Urology

## 2017-02-23 ENCOUNTER — Ambulatory Visit (INDEPENDENT_AMBULATORY_CARE_PROVIDER_SITE_OTHER): Payer: Medicare Other | Admitting: Cardiology

## 2017-02-23 ENCOUNTER — Encounter: Payer: Self-pay | Admitting: Cardiology

## 2017-02-23 VITALS — BP 172/100 | HR 70 | Ht 68.0 in | Wt 211.8 lb

## 2017-02-23 DIAGNOSIS — I10 Essential (primary) hypertension: Secondary | ICD-10-CM

## 2017-02-23 DIAGNOSIS — E119 Type 2 diabetes mellitus without complications: Secondary | ICD-10-CM | POA: Diagnosis not present

## 2017-02-23 DIAGNOSIS — I2510 Atherosclerotic heart disease of native coronary artery without angina pectoris: Secondary | ICD-10-CM

## 2017-02-23 DIAGNOSIS — E782 Mixed hyperlipidemia: Secondary | ICD-10-CM

## 2017-02-23 MED ORDER — LISINOPRIL 20 MG PO TABS: 20.0000 mg | ORAL_TABLET | Freq: Every day | ORAL | 1 refills | Status: DC

## 2017-02-23 NOTE — Progress Notes (Signed)
Clinical Summary Mr. Monarch is a 71 y.o.male  seen today for follow up of the medical problems.   1. CAD - recent NSTEMI 06/2014, received PTCA of circumflex as described below. Echo LVEF 50-55%, inferior hypokinesis, grade I diastolic dysfunction.  - no recent chest pain. No recent SOB/DOE - compliant with meds   2. HTN - home bp's with systolics to 409O.  - compliant with meds  3. DM2 Followed by pcp. HgbA1c 11/2016 at goal at 6.3  4. Hyperlipidemia - intolerant to statins, has been on zetia only. - 11/2016: TC 138 TG 197 HDL 25 LDL 74       Past Medical History:  Diagnosis Date  . Arthritis   . Bladder cancer ([place])   . CAD (coronary artery disease)    a. 06/2014: NSTEMI s/p PTCA of LCx, 60-80% residual in distal LAD, 70% prox small D1.   b. 02/06/15 NSTEMI  s/p successful PCI/DES to mid RCA  . Diabetes mellitus, type 2 ([place])   . Elevated PSA   . HLD (hyperlipidemia)   . HOH (hard of hearing)   . Hypertension   . NSTEMI (non-ST elevated myocardial infarction) ([facility])      Allergies  Allergen Reactions  . Lipitor [Atorvastatin] Other (See Comments)    Myalgia  . Other Other (See Comments)    All Cholesterol Meds - causes muscle and joint pain  . Pravastatin Other (See Comments)    Myalgia     Current Outpatient Prescriptions  Medication Sig Dispense Refill  . aspirin 81 MG chewable tablet Chew 1 tablet (81 mg total) by mouth daily.    . clopidogrel (PLAVIX) 75 MG tablet Take 1 tablet (75 mg total) by mouth daily with breakfast. 30 tablet 6  . glimepiride (AMARYL) 2 MG tablet Take 2 mg by mouth daily.     Marland Kitchen lisinopril (PRINIVIL,ZESTRIL) 10 MG tablet Take 1 tablet (10 mg total) by mouth daily. 30 tablet 6  . metFORMIN (GLUCOPHAGE) 500 MG tablet Take 1 tablet (500 mg total) by mouth 2 (two) times daily with a meal. 60 tablet 11  . metoprolol succinate (TOPROL-XL) 25 MG 24 hr tablet Take 3 tables total 8m dailty 90 tablet 5  . NITROSTAT 0.4 MG SL  tablet DISSOLVE 1 TABLET UNDER TONGUE EVERY 5 MINS FOR 3 DOSES AS NEEDED FOR CHEST PAIN 25 tablet 3  . Omega-3 Fatty Acids (FISH OIL) 1000 MG CAPS Take 4 capsules (4,000 mg total) by mouth daily. (Patient taking differently: Take 1,000 mg by mouth daily. )  0  . pantoprazole (PROTONIX) 40 MG tablet TAKE 1 TABLET (40 MG TOTAL) BY MOUTH DAILY. 30 tablet 2  . Polyethyl Glycol-Propyl Glycol (SYSTANE OP) Place 1-2 drops into both eyes daily as needed (dry eyes).      No current facility-administered medications for this visit.      Past Surgical History:  Procedure Laterality Date  . CARDIAC CATHETERIZATION  02/06/2015   Procedure: CORONARY STENT INTERVENTION;  Surgeon: DLeonie Man MD;  [facility];  Service: Cardiovascular;;  Mid RCA  . CARDIAC CATHETERIZATION N/A 11/11/2016   Procedure: Left Heart Cath and Coronary Angiography;  Surgeon: JJettie Booze MD;  [facility];  Service: Cardiovascular;  Laterality: N/A;  . CARDIAC CATHETERIZATION N/A 11/11/2016   Procedure: Coronary Balloon Angioplasty;  Surgeon: JJettie Booze MD;  [facility];  Service: Cardiovascular;  Laterality: N/A;  . CORONARY ANGIOPLASTY  11/11/2016  Mid Cx to Dist Cx lesion, 100 %stenosed. This lesion was treated with balloon angioplasty with a 2.0 balloon  . CYSTOSCOPY W/ RETROGRADES N/A 05/05/2015   Procedure: CYSTOSCOPY WITH BILATERAL RETROGRADE PYELOGRAMS AND BLADDER BIOPSIES;  Surgeon: [name], MD;  Location: [facility];  Service: Urology;  Laterality: N/A;  . LEFT HEART CATHETERIZATION WITH CORONARY ANGIOGRAM N/A 06/17/2014   Procedure: LEFT HEART CATHETERIZATION WITH CORONARY ANGIOGRAM;  Surgeon: [name], MD;  Location: [facility] CATH LAB;  Service: Cardiovascular;  Laterality: N/A;  . LEFT HEART CATHETERIZATION WITH CORONARY ANGIOGRAM N/A 02/06/2015   Procedure: LEFT HEART CATHETERIZATION WITH CORONARY ANGIOGRAM;  Surgeon: [name], MD;  Location: [facility]  CATH LAB;  Service: Cardiovascular;  Laterality: N/A;  . PROSTATE BIOPSY N/A 03/31/2014   Procedure: BIOPSY TRANSRECTAL ULTRASONIC PROSTATE (TUBP);  Surgeon: [name], MD;  Location: [facility];  Service: Urology;  Laterality: N/A;  . TRANSURETHRAL RESECTION OF BLADDER TUMOR N/A 03/14/2013   Procedure: TRANSURETHRAL RESECTION OF BLADDER TUMOR (TURBT);  Surgeon: [name], MD;  Location: [facility];  Service: Urology;  Laterality: N/A;  1 HR WITH MITOMYCIN INSTILLATION   . TRANSURETHRAL RESECTION OF BLADDER TUMOR N/A 09/30/2013   Procedure: TRANSURETHRAL RESECTION OF BLADDER TUMOR (TURBT);  Surgeon: [name], MD;  Location[facility];  Service: Urology;  Laterality: N/A;  . TRANSURETHRAL RESECTION OF BLADDER TUMOR N/A 03/31/2014   Procedure: TRANSURETHRAL RESECTION OF BLADDER TUMOR (TURBT);  Surgeon: [name], MD;  Location: [facility];  Service: Urology;  Laterality: N/A;  . TRANSURETHRAL RESECTION OF BLADDER TUMOR N/A 05/31/2015   Procedure: Cystoscopy with clott evacuation and fulgeration of bleeders, retrograde pylegram;  Surgeon: [name], MD;  Location: [facility];  Service: Urology;  Laterality: N/A;     Allergies  Allergen Reactions  . Lipitor [Atorvastatin] Other (See Comments)    Myalgia  . Other Other (See Comments)    All Cholesterol Meds - causes muscle and joint pain  . Pravastatin Other (See Comments)    Myalgia      Family History  Problem Relation Age of Onset  . Cancer Child   . Asthma Mother      Social History Mr. Albea reports that he quit smoking about 23 years ago. His smoking use included Cigarettes. He has a 30.00 pack-year smoking history. He has never used smokeless tobacco. Mr. Dilauro reports that he does not drink alcohol.   Review of Systems CONSTITUTIONAL: No weight loss, fever, chills, weakness or fatigue.  HEENT: Eyes: No visual loss,  blurred vision, double vision or yellow sclerae.No hearing loss, sneezing, congestion, runny nose or sore throat.  SKIN: No rash or itching.  CARDIOVASCULAR: per hpi RESPIRATORY: No shortness of breath, cough or sputum.  GASTROINTESTINAL: No anorexia, nausea, vomiting or diarrhea. No abdominal pain or blood.  GENITOURINARY: No burning on urination, no polyuria NEUROLOGICAL: No headache, dizziness, syncope, paralysis, ataxia, numbness or tingling in the extremities. No change in bowel or bladder control.  MUSCULOSKELETAL: No muscle, back pain, joint pain or stiffness.  LYMPHATICS: No enlarged nodes. No history of splenectomy.  PSYCHIATRIC: No history of depression or anxiety.  ENDOCRINOLOGIC: No reports of sweating, cold or heat intolerance. No polyuria or polydipsia.  Marland Kitchen   Physical Examination Vitals:   02/23/17 1131 02/23/17 1136  BP: (!) 179/101 (!) 172/100  Pulse: 67 70   Vitals:   02/23/17 1131  Weight: 211 lb 12.8 oz (96.1 kg)  Height: '5\' 8"'  (1.727 m)  Gen: resting comfortably, no acute distress HEENT: no scleral icterus, pupils equal round and reactive, no palptable cervical adenopathy,  CV: RRR, no m/r/g, no jvd Resp: Clear to auscultation bilaterally GI: abdomen is soft, non-tender, non-distended, normal bowel sounds, no hepatosplenomegaly MSK: extremities are warm, no edema.  Skin: warm, no rash Neuro:  no focal deficits Psych: appropriate affect   Diagnostic Studies 06/2014 Cath FINDINGS: Hemodynamics:  Central Aortic Pressure / Mean: 108/62/83 mmHg  Left Ventricular Pressure / LVEDP: 107/7/17 mmHg  Left Ventriculography:  EF:~45% %  Wall Motion:global Hypokinesis  Coronary Anatomy:  Dominance: Right  Left Main: Large caliber, long trunk that trifurcates into the LAD, Circumflex & Ramus Intermedius. Angiographically normal. TGG:YIRSWN as a large caliber vessel that tapers into a relatively small caliber (~1.5-2.0 mm) vessel distally. It  wraps the apex, perfusing the distal 1/3 of the infero-apex. The distal vessel has tandem 60&70-80% focal lesions prior to the apex.  D1:Very small caliber bifurcating vessel with proximal ~70% stenosis.  Left Circumflex:Begins as a large caliber vessel that also tapers to a small to moderate caliber vessel in the AV Groove where it gives off a small caliber OM1 Daleigh Pollinger and is 100% occluded just beyond that point.   Post-PTCA: beyond the occluded AV Groove Circumflex, there is a small to moderate caliber LPL Labrina Lines.  Ramus intermedius:Large caliber vessel that bifurcates in the mid-vessel into to small to moderate caliber branches that both reach the apex.   RCA: Large caliber, dominant vessel with diffuse ~20-40% lesions, but no obstructive disease. It bifurcates distally in to a small caliber, short PDA and Right Posterior AV Groove Pinchus Weckwerth (RPAV) branches. RPAV gives off several very small banches.  After reviewing the initial angiography, the culprit lesion was thought to be the occluded distal-AV Groove circumflesx. Preparation were made to proceed with PTCA on this lesion.  Percutaneous Coronary Intervention:  Guide: 6 Fr CLS 3.5 -- very difficult guide support, catheter moved &disengaged with patient movement Guidewire: BMW (advanced initially in to an atrial Farmer Mccahill for support & balloon advanced into proximal Circumflex allowing for further advancmemt of the wire beyond the culprit lesion. Predilation Balloon #1: Emerge 1.5 mm x 12 mm; Used to assist with wire support to advance guidewire ? 6 Atm x 30 Sec, 8 Atm x 30 Sec ? Post inflation revealed a small to moderate follow-on circumflex with LPL Breyon Sigg.  ? Due to the relatively small sized distal vessel, the decision was to perform PTCA only. Balloon #2: Euphora 2.0 mm x 12 mm;  ? 8 Atm x 90 Sec x 2 with IC NTG - no recoil ? Final Diameter: ~2.0 mm  Post deployment angiography in multiple views, with and without  guidewire in place revealed excellent stent deployment and lesion coverage. There was no evidence of dissection or perforation.  POST-OPERATIVE DIAGNOSIS:   Severe 2 vessel disease in small caliber vessel - distal AV Groove Circumflex 100% and distal LAD ~60&80% lesions (1.5 mm vessel)  Successful PTCA of the occluded AV Groove Circumflex with Stent-like result.  Moderately reduced LVEF by LV Gram  Normal LVEDP   06/2014 Echo Study Conclusions  - Left ventricle: The cavity size was normal. Wall thickness was normal. Systolic function was normal. The estimated ejection fraction was in the range of 50% to 55%. Inferior hypokinesis. Doppler parameters are consistent with abnormal left ventricular relaxation (grade 1 diastolic dysfunction). The E/e&' ratio is 15, suggesting borderline elevated LV filling pressure. - Aortic valve: Trileaflet. Sclerosis without stenosis. There  was no regurgitation. - Left atrium: The atrium was normal in size. - Tricuspid valve: There was mild regurgitation. - Pulmonary arteries: PA peak pressure: 33 mm Hg (S).  Impressions:  - LVEF 50-55%, inferior hypokinesis, mild TR, top normal RVSP, diastolic dysfunction with borderline elevated LV filling pressure.   11/2016 echo Study Conclusions  - Left ventricle: The cavity size was normal. Wall thickness was normal. Systolic function was mildly to moderately reduced. The estimated ejection fraction was in the range of 40% to 45%. There is akinesis of the basal-midinferior and inferoseptal myocardium. Doppler parameters are consistent with abnormal left ventricular relaxation (grade 1 diastolic dysfunction). - Aortic valve: Trileaflet; mildly thickened, mildly calcified leaflets. There was trivial regurgitation.  Impressions:  - EF is mildly reduced when compared to prior.   11/2016 cath  There is mild left ventricular systolic dysfunction.  The left  ventricular ejection fraction is 45-50% by visual estimate.  Mid RCA-2 lesion, 25 %stenosed.  Patent mid RCA stent.  Ost 2nd Diag to 2nd Diag lesion, 80 %stenosed. Unchanged from prior.  Dist LAD lesion, 60 %stenosed. Unchanged from prior.  Mid Cx to Dist Cx lesion, 100 %stenosed. This lesion was treated with balloon angioplasty with a 2.0 balloon. Several inflations were performed.  Post intervention, there is a 0% residual stenosis. The vessel was too small to be stented. There is also significant tortuosity in the proximal vessel.  LV end diastolic pressure is mildly elevated.  Initially, Brilinta was given for antiplatelet therapy in the Cath Lab. Since he did not receive a stent, we will transition him to clopidogrel. He needs aggressive secondary prevention including lipid-lowering therapy. He has been intolerant to statins in the past. Would need to consider PCS K9 inhibitor along with other secondary prevention. I anticipate he'll be here 2 days in the hospital. Echocardiogram would be reasonable to check ejection fraction and mitral apparatus.  LAD and diagonal disease was not addressed since they appeared unchanged angiographically. If he has refractory angina, could address the LAD lesion at a later time.  Jan 2018 AAA [place] No AAA  Assessment and Plan  1. CAD - denies any current symptoms - continue current meds  2. HTN - elevated in clinic. Increase lisinopril to 17m daily. Check BMET in 2 weeks, submit bp log in 2 weeks.   3. Hyperlipidemia - side effects to atrorva and prava. Not on statin. Lipid panel does not justify zetia or alternative agent.   4. DM2 - at goal, medical therapy per pcp  F/u 6 months    JArnoldo Lenis M.D.

## 2017-02-23 NOTE — Patient Instructions (Signed)
Your physician wants you to follow-up in: [place] will receive a reminder letter in the mail two months in advance. If you don't receive a letter, please call our office to schedule the follow-up appointment.  Your physician has recommended you make the following change in your medication:   INCREASE LISINOPRIL 20 MG DAILY  Your physician has requested that you regularly monitor and record your blood pressure readings at home FOR 2 [place]. Please use the same machine at the same time of day to check your readings and record them to bring to your follow-up visit.  Your physician recommends that you return for lab work in: 2 WEEKS BMP  Thank you for choosing [facility]!!

## 2017-03-21 ENCOUNTER — Telehealth: Payer: Self-pay | Admitting: *Deleted

## 2017-03-21 DIAGNOSIS — I10 Essential (primary) hypertension: Secondary | ICD-10-CM

## 2017-03-21 NOTE — Telephone Encounter (Signed)
Pt dropped off BP readings:  Routed to Dr. Harl Bowie   3/26 - 161/87 3/27 - 156/86 3/28 - 155/95 3/29 - 160/95 3/30 - 142/94 3/31 - 152/90 4/1   - 147/90 4/3   - 140/97 4/4   - 143/92 4/6   - 146/94 4/8   - 155/87

## 2017-03-24 NOTE — Telephone Encounter (Signed)
Pt says he forgot about lab work - says he will have done early next week and call us back

## 2017-03-24 NOTE — Telephone Encounter (Signed)
After last visit he was to have blood work done, did he have it done? His bp's are too high, I'd like to increase his lisinopril again but need to see the previous blood work   Lenna Sciara BrancH MD

## 2017-03-27 ENCOUNTER — Other Ambulatory Visit: Payer: Self-pay | Admitting: Cardiology

## 2017-03-27 DIAGNOSIS — I10 Essential (primary) hypertension: Secondary | ICD-10-CM | POA: Diagnosis not present

## 2017-03-28 LAB — BASIC METABOLIC PANEL
BUN: 17 mg/dL (ref 7–25)
CALCIUM: 9.5 mg/dL (ref 8.6–10.3)
CO2: 28 mmol/L (ref 20–31)
Chloride: 105 mmol/L (ref 98–110)
Creat: 1.38 mg/dL — ABNORMAL HIGH (ref 0.70–1.25)
Glucose, Bld: 64 mg/dL — ABNORMAL LOW (ref 65–99)
POTASSIUM: 4.5 mmol/L (ref 3.5–5.3)
SODIUM: 141 mmol/L (ref 135–146)

## 2017-03-29 MED ORDER — LISINOPRIL 40 MG PO TABS: 40.0000 mg | ORAL_TABLET | Freq: Every day | ORAL | 1 refills | Status: DC

## 2017-03-29 NOTE — Addendum Note (Signed)
Addended by: Julian Hy T on: 03/29/2017 02:33 PM   Modules accepted: Orders

## 2017-03-29 NOTE — Telephone Encounter (Signed)
Labs received, verify he has been taking lisinopril 20mg  daily and increase to 40mg  daily. REpeat BMET in [redacted] weeks along with bp log  Zandra Abts MD

## 2017-03-29 NOTE — Telephone Encounter (Signed)
Pt verified lisinopril 20 mg daily - says he will take 2 tablets until he finishes. Requested new rx sent to [facility] - will mail pt lab work with due date - pt will f/u with BP readings.

## 2017-04-04 ENCOUNTER — Ambulatory Visit (INDEPENDENT_AMBULATORY_CARE_PROVIDER_SITE_OTHER): Payer: Medicare Other | Admitting: Urology

## 2017-04-04 ENCOUNTER — Other Ambulatory Visit (HOSPITAL_COMMUNITY)
Admission: RE | Admit: 2017-04-04 | Discharge: 2017-04-04 | Disposition: A | Discharge status: Home / Self Care | Payer: Medicare Other | Source: Ambulatory Visit | Attending: Urology | Admitting: Urology

## 2017-04-04 DIAGNOSIS — N401 Enlarged prostate with lower urinary tract symptoms: Secondary | ICD-10-CM | POA: Diagnosis not present

## 2017-04-04 DIAGNOSIS — C678 Malignant neoplasm of overlapping sites of bladder: Secondary | ICD-10-CM | POA: Insufficient documentation

## 2017-04-04 DIAGNOSIS — C61 Malignant neoplasm of prostate: Secondary | ICD-10-CM | POA: Diagnosis not present

## 2017-04-04 DIAGNOSIS — R8299 Other abnormal findings in urine: Secondary | ICD-10-CM | POA: Diagnosis not present

## 2017-04-04 DIAGNOSIS — C674 Malignant neoplasm of posterior wall of bladder: Secondary | ICD-10-CM

## 2017-04-25 DIAGNOSIS — E1129 Type 2 diabetes mellitus with other diabetic kidney complication: Secondary | ICD-10-CM | POA: Diagnosis not present

## 2017-04-25 DIAGNOSIS — I10 Essential (primary) hypertension: Secondary | ICD-10-CM | POA: Diagnosis not present

## 2017-04-25 DIAGNOSIS — I2510 Atherosclerotic heart disease of native coronary artery without angina pectoris: Secondary | ICD-10-CM | POA: Diagnosis not present

## 2017-04-25 DIAGNOSIS — Z1389 Encounter for screening for other disorder: Secondary | ICD-10-CM | POA: Diagnosis not present

## 2017-04-25 DIAGNOSIS — E782 Mixed hyperlipidemia: Secondary | ICD-10-CM | POA: Diagnosis not present

## 2017-04-25 DIAGNOSIS — I739 Peripheral vascular disease, unspecified: Secondary | ICD-10-CM | POA: Diagnosis not present

## 2017-04-25 DIAGNOSIS — M1991 Primary osteoarthritis, unspecified site: Secondary | ICD-10-CM | POA: Diagnosis not present

## 2017-05-02 ENCOUNTER — Other Ambulatory Visit: Payer: Self-pay | Admitting: Cardiology

## 2017-05-02 MED ORDER — CLOPIDOGREL BISULFATE 75 MG PO TABS: 75.0000 mg | ORAL_TABLET | Freq: Every day | ORAL | 6 refills | Status: DC

## 2017-05-02 NOTE — Telephone Encounter (Signed)
clopidogrel (PLAVIX) 75 MG tablet   [facility]  In [place], [place]

## 2017-05-03 IMAGING — RF DG RETROGRADE PYELOGRAM
1 series · 10 of 10 positions shown · non-contrast
Comparison: CT abdomen pelvis - 03/01/2013

FLUOROSCOPY TIME:  40 seconds

CLINICAL DATA: History of bladder cancer. Bilateral retrograde
pyelogram.

EXAM:
RETROGRADE PYELOGRAM

[Series 1: run · 7 acquisitions, 10 frames shown]
[im 1/7]
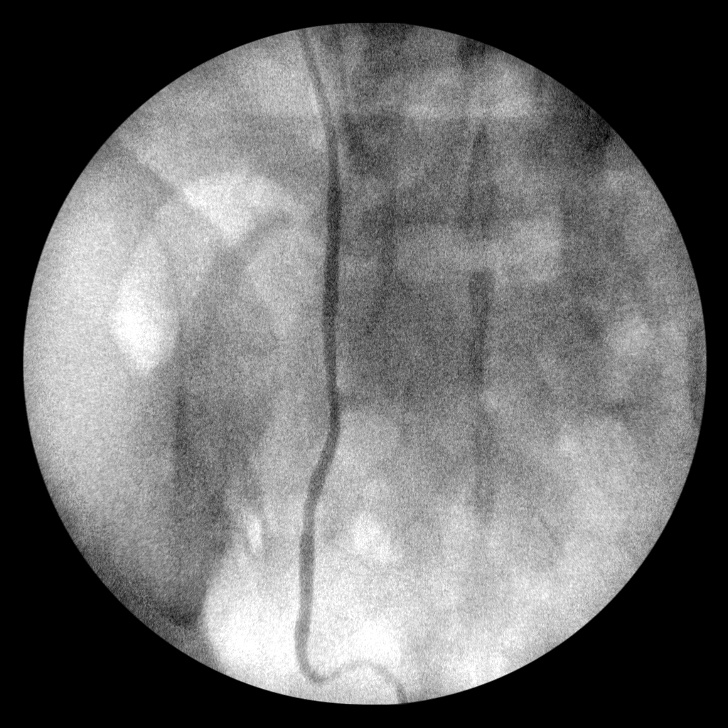
[im 1/7]
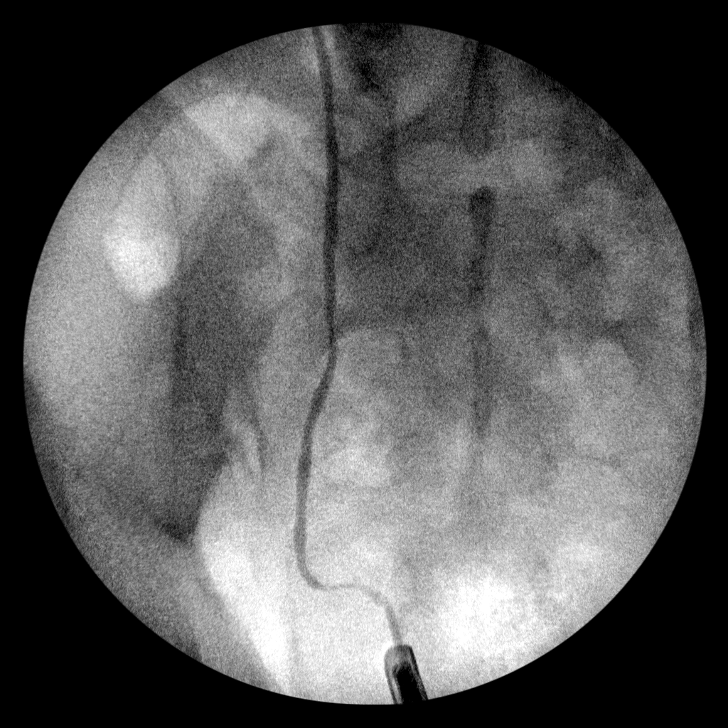
[im 1/7]
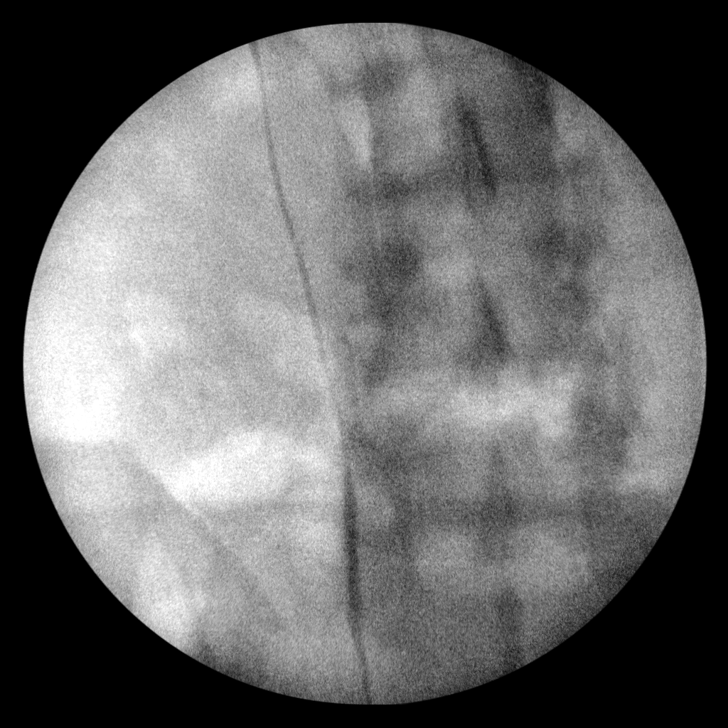
[im 1/7]
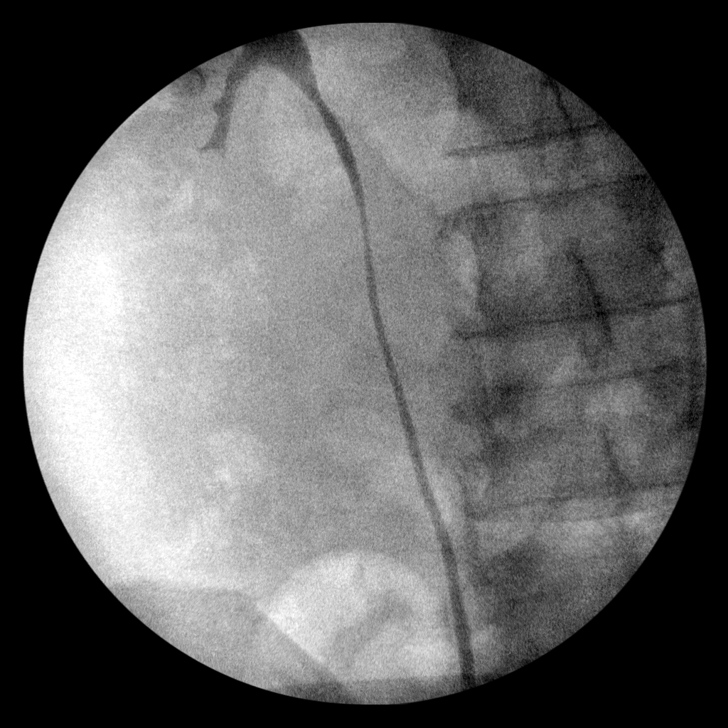
[im 2/7]
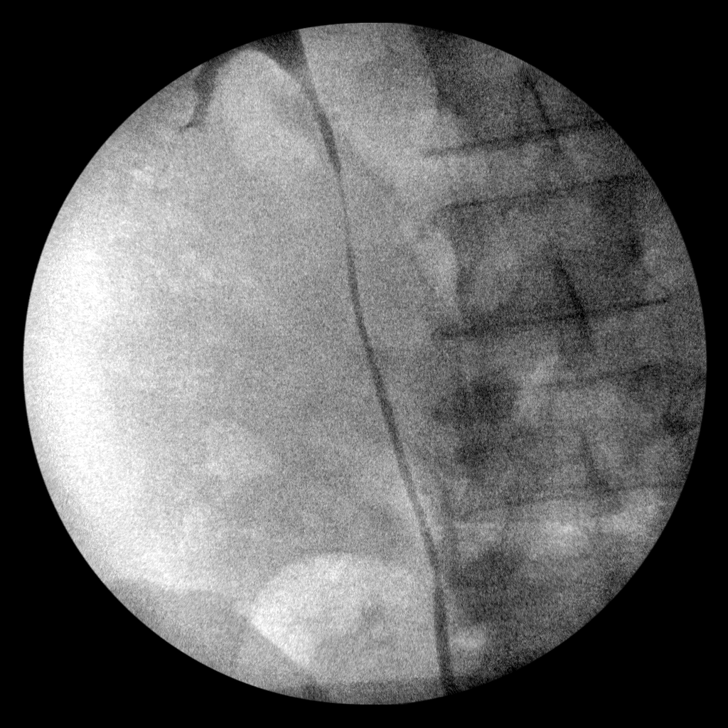
[im 3/7]
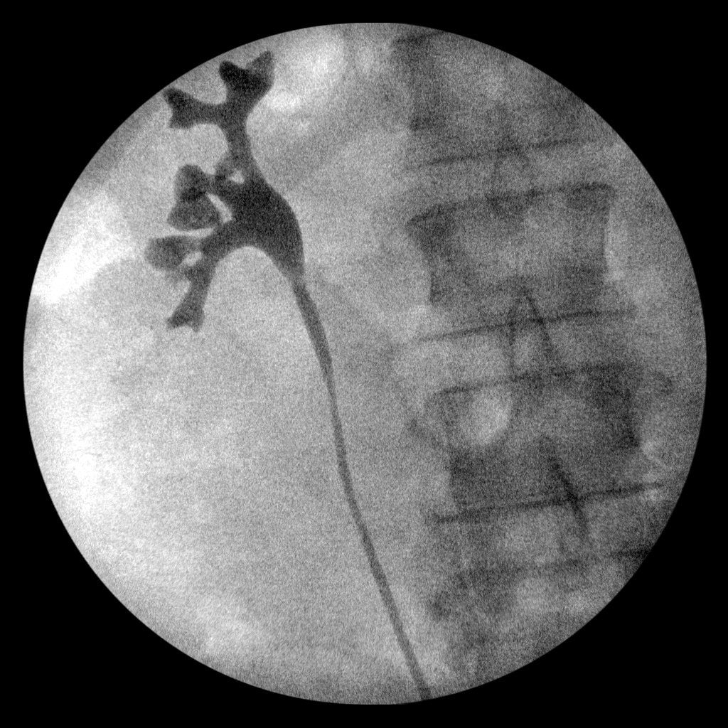
[im 4/7]
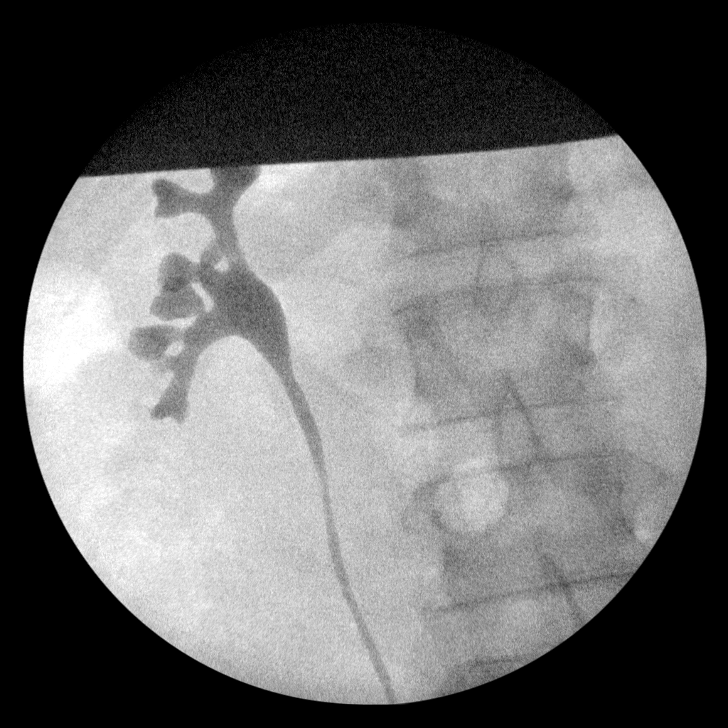
[im 5/7]
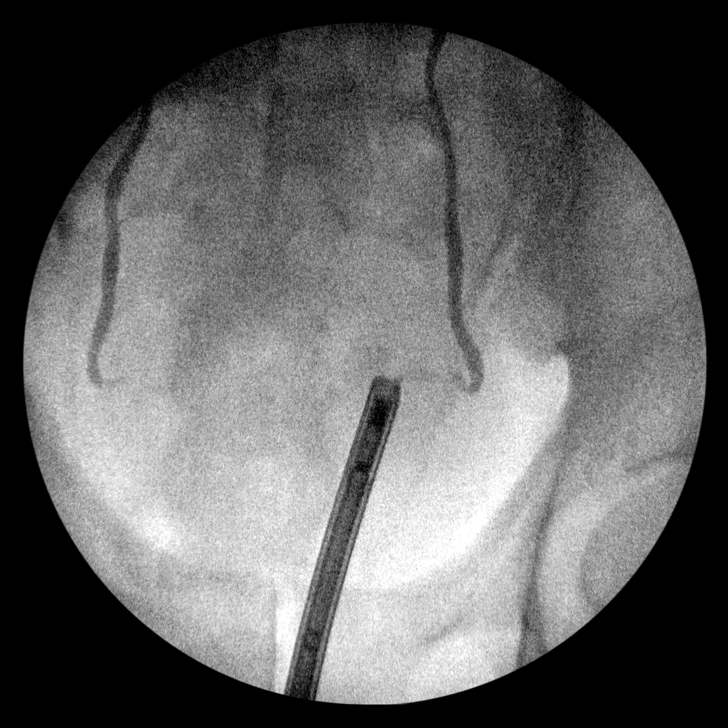
[im 6/7]
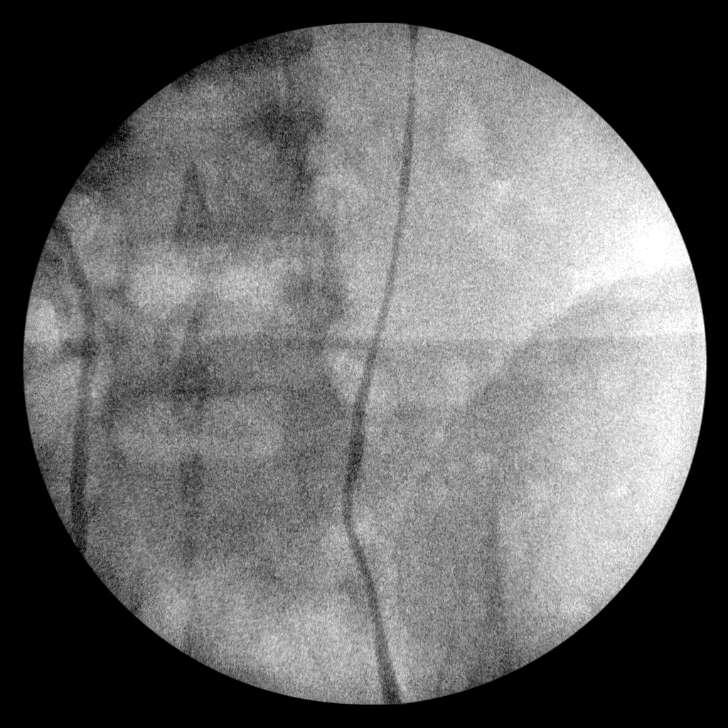
[im 7/7]
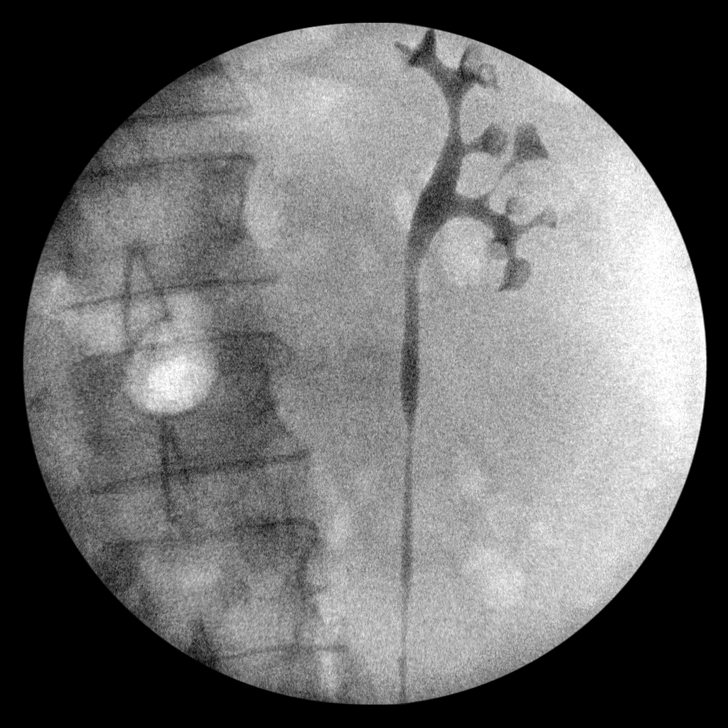

[10 of 10 positions shown; findings below may reference images not displayed]

FINDINGS: Multiple spot fluoroscopic images were obtained during bilateral
retrograde pyelogram.

No discrete persistent filling defects are seen within the opacified
portions of either ureter or collecting system.

No evidence of ureterectasis or pelvicaliectasis.
IMPRESSION: No discrete persistent filling defects are seen within either ureter
or collecting system.

## 2017-06-16 ENCOUNTER — Other Ambulatory Visit (HOSPITAL_COMMUNITY): Payer: Self-pay | Admitting: Nephrology

## 2017-06-16 DIAGNOSIS — I10 Essential (primary) hypertension: Secondary | ICD-10-CM | POA: Diagnosis not present

## 2017-06-16 DIAGNOSIS — N182 Chronic kidney disease, stage 2 (mild): Secondary | ICD-10-CM | POA: Diagnosis not present

## 2017-06-16 DIAGNOSIS — R809 Proteinuria, unspecified: Secondary | ICD-10-CM | POA: Diagnosis not present

## 2017-06-16 DIAGNOSIS — N183 Chronic kidney disease, stage 3 unspecified: Secondary | ICD-10-CM

## 2017-06-29 DIAGNOSIS — Z1159 Encounter for screening for other viral diseases: Secondary | ICD-10-CM | POA: Diagnosis not present

## 2017-06-29 DIAGNOSIS — D509 Iron deficiency anemia, unspecified: Secondary | ICD-10-CM | POA: Diagnosis not present

## 2017-06-29 DIAGNOSIS — Z79899 Other long term (current) drug therapy: Secondary | ICD-10-CM | POA: Diagnosis not present

## 2017-06-29 DIAGNOSIS — I10 Essential (primary) hypertension: Secondary | ICD-10-CM | POA: Diagnosis not present

## 2017-06-29 DIAGNOSIS — N183 Chronic kidney disease, stage 3 (moderate): Secondary | ICD-10-CM | POA: Diagnosis not present

## 2017-06-29 DIAGNOSIS — R809 Proteinuria, unspecified: Secondary | ICD-10-CM | POA: Diagnosis not present

## 2017-07-03 ENCOUNTER — Ambulatory Visit (HOSPITAL_COMMUNITY)
Admission: RE | Admit: 2017-07-03 | Discharge: 2017-07-03 | Disposition: A | Discharge status: Home / Self Care | Payer: Medicare Other | Source: Ambulatory Visit | Attending: Nephrology | Admitting: Nephrology

## 2017-07-03 DIAGNOSIS — N400 Enlarged prostate without lower urinary tract symptoms: Secondary | ICD-10-CM | POA: Diagnosis not present

## 2017-07-03 DIAGNOSIS — N183 Chronic kidney disease, stage 3 unspecified: Secondary | ICD-10-CM

## 2017-07-03 DIAGNOSIS — N200 Calculus of kidney: Secondary | ICD-10-CM | POA: Insufficient documentation

## 2017-07-12 DIAGNOSIS — R809 Proteinuria, unspecified: Secondary | ICD-10-CM | POA: Diagnosis not present

## 2017-07-12 DIAGNOSIS — N182 Chronic kidney disease, stage 2 (mild): Secondary | ICD-10-CM | POA: Diagnosis not present

## 2017-07-12 DIAGNOSIS — N183 Chronic kidney disease, stage 3 (moderate): Secondary | ICD-10-CM | POA: Diagnosis not present

## 2017-07-21 DIAGNOSIS — H33322 Round hole, left eye: Secondary | ICD-10-CM | POA: Diagnosis not present

## 2017-07-21 DIAGNOSIS — E119 Type 2 diabetes mellitus without complications: Secondary | ICD-10-CM | POA: Diagnosis not present

## 2017-07-21 DIAGNOSIS — H5213 Myopia, bilateral: Secondary | ICD-10-CM | POA: Diagnosis not present

## 2017-07-21 DIAGNOSIS — H33331 Multiple defects of retina without detachment, right eye: Secondary | ICD-10-CM | POA: Diagnosis not present

## 2017-07-21 DIAGNOSIS — H11153 Pinguecula, bilateral: Secondary | ICD-10-CM | POA: Diagnosis not present

## 2017-07-24 ENCOUNTER — Other Ambulatory Visit: Payer: Self-pay

## 2017-07-24 MED ORDER — METOPROLOL SUCCINATE ER 25 MG PO TB24: ORAL_TABLET | ORAL | 1 refills | Status: DC

## 2017-07-27 DIAGNOSIS — I2510 Atherosclerotic heart disease of native coronary artery without angina pectoris: Secondary | ICD-10-CM | POA: Diagnosis not present

## 2017-07-27 DIAGNOSIS — I739 Peripheral vascular disease, unspecified: Secondary | ICD-10-CM | POA: Diagnosis not present

## 2017-07-27 DIAGNOSIS — I10 Essential (primary) hypertension: Secondary | ICD-10-CM | POA: Diagnosis not present

## 2017-07-27 DIAGNOSIS — E782 Mixed hyperlipidemia: Secondary | ICD-10-CM | POA: Diagnosis not present

## 2017-07-27 DIAGNOSIS — E1129 Type 2 diabetes mellitus with other diabetic kidney complication: Secondary | ICD-10-CM | POA: Diagnosis not present

## 2017-08-03 ENCOUNTER — Encounter (INDEPENDENT_AMBULATORY_CARE_PROVIDER_SITE_OTHER): Payer: Self-pay | Admitting: Internal Medicine

## 2017-08-22 ENCOUNTER — Other Ambulatory Visit: Payer: Self-pay | Admitting: *Deleted

## 2017-08-22 MED ORDER — CLOPIDOGREL BISULFATE 75 MG PO TABS: 75.0000 mg | ORAL_TABLET | Freq: Every day | ORAL | 6 refills | Status: DC

## 2017-09-13 ENCOUNTER — Other Ambulatory Visit: Payer: Self-pay

## 2017-09-13 MED ORDER — METOPROLOL SUCCINATE ER 25 MG PO TB24: ORAL_TABLET | ORAL | 3 refills | Status: DC

## 2017-09-21 DIAGNOSIS — H903 Sensorineural hearing loss, bilateral: Secondary | ICD-10-CM | POA: Diagnosis not present

## 2017-09-28 ENCOUNTER — Ambulatory Visit (INDEPENDENT_AMBULATORY_CARE_PROVIDER_SITE_OTHER): Payer: Medicare Other | Admitting: Internal Medicine

## 2017-09-28 DIAGNOSIS — I10 Essential (primary) hypertension: Secondary | ICD-10-CM | POA: Diagnosis not present

## 2017-09-28 DIAGNOSIS — Z0001 Encounter for general adult medical examination with abnormal findings: Secondary | ICD-10-CM | POA: Diagnosis not present

## 2017-09-28 DIAGNOSIS — M545 Low back pain: Secondary | ICD-10-CM | POA: Diagnosis not present

## 2017-09-28 DIAGNOSIS — Z1389 Encounter for screening for other disorder: Secondary | ICD-10-CM | POA: Diagnosis not present

## 2017-09-28 DIAGNOSIS — Z0000 Encounter for general adult medical examination without abnormal findings: Secondary | ICD-10-CM | POA: Diagnosis not present

## 2017-09-28 DIAGNOSIS — E119 Type 2 diabetes mellitus without complications: Secondary | ICD-10-CM | POA: Diagnosis not present

## 2017-09-29 ENCOUNTER — Encounter (INDEPENDENT_AMBULATORY_CARE_PROVIDER_SITE_OTHER): Payer: Self-pay | Admitting: Internal Medicine

## 2017-10-05 ENCOUNTER — Other Ambulatory Visit: Payer: Self-pay | Admitting: Cardiology

## 2017-10-10 ENCOUNTER — Inpatient Hospital Stay (HOSPITAL_COMMUNITY): Admission: RE | Admit: 2017-10-10 | Payer: Self-pay

## 2017-10-10 ENCOUNTER — Other Ambulatory Visit (HOSPITAL_COMMUNITY)
Admission: RE | Admit: 2017-10-10 | Discharge: 2017-10-10 | Disposition: A | Discharge status: Home / Self Care | Payer: Medicare Other | Source: Ambulatory Visit | Attending: Urology | Admitting: Urology

## 2017-10-10 ENCOUNTER — Ambulatory Visit: Payer: Medicare Other | Admitting: Urology

## 2017-10-10 DIAGNOSIS — C61 Malignant neoplasm of prostate: Secondary | ICD-10-CM

## 2017-10-10 DIAGNOSIS — C678 Malignant neoplasm of overlapping sites of bladder: Secondary | ICD-10-CM | POA: Diagnosis not present

## 2017-10-10 DIAGNOSIS — N401 Enlarged prostate with lower urinary tract symptoms: Secondary | ICD-10-CM | POA: Diagnosis not present

## 2017-10-10 DIAGNOSIS — R82998 Other abnormal findings in urine: Secondary | ICD-10-CM | POA: Diagnosis not present

## 2017-10-10 DIAGNOSIS — R351 Nocturia: Secondary | ICD-10-CM | POA: Diagnosis not present

## 2017-10-24 ENCOUNTER — Ambulatory Visit: Payer: Medicare Other | Admitting: Urology

## 2017-10-24 ENCOUNTER — Other Ambulatory Visit (HOSPITAL_COMMUNITY)
Admission: RE | Admit: 2017-10-24 | Discharge: 2017-10-24 | Disposition: A | Discharge status: Home / Self Care | Payer: Medicare Other | Source: Other Acute Inpatient Hospital | Attending: Urology | Admitting: Urology

## 2017-10-24 DIAGNOSIS — C678 Malignant neoplasm of overlapping sites of bladder: Secondary | ICD-10-CM | POA: Diagnosis not present

## 2017-10-24 DIAGNOSIS — N3001 Acute cystitis with hematuria: Secondary | ICD-10-CM

## 2017-10-27 LAB — URINE CULTURE

## 2017-11-08 ENCOUNTER — Other Ambulatory Visit: Payer: Self-pay | Admitting: Cardiology

## 2017-12-08 ENCOUNTER — Other Ambulatory Visit: Payer: Self-pay | Admitting: *Deleted

## 2017-12-08 MED ORDER — METOPROLOL SUCCINATE ER 25 MG PO TB24: ORAL_TABLET | ORAL | 0 refills | Status: DC

## 2017-12-19 DIAGNOSIS — R809 Proteinuria, unspecified: Secondary | ICD-10-CM | POA: Diagnosis not present

## 2017-12-19 DIAGNOSIS — D509 Iron deficiency anemia, unspecified: Secondary | ICD-10-CM | POA: Diagnosis not present

## 2017-12-19 DIAGNOSIS — Z79899 Other long term (current) drug therapy: Secondary | ICD-10-CM | POA: Diagnosis not present

## 2017-12-19 DIAGNOSIS — I10 Essential (primary) hypertension: Secondary | ICD-10-CM | POA: Diagnosis not present

## 2017-12-19 DIAGNOSIS — E559 Vitamin D deficiency, unspecified: Secondary | ICD-10-CM | POA: Diagnosis not present

## 2017-12-19 DIAGNOSIS — N183 Chronic kidney disease, stage 3 (moderate): Secondary | ICD-10-CM | POA: Diagnosis not present

## 2017-12-20 ENCOUNTER — Other Ambulatory Visit: Payer: Self-pay | Admitting: *Deleted

## 2017-12-20 MED ORDER — LISINOPRIL 40 MG PO TABS: 40.0000 mg | ORAL_TABLET | Freq: Every day | ORAL | 0 refills | Status: DC

## 2017-12-25 ENCOUNTER — Ambulatory Visit: Payer: Medicare Other | Admitting: Cardiology

## 2017-12-25 DIAGNOSIS — H6122 Impacted cerumen, left ear: Secondary | ICD-10-CM | POA: Diagnosis not present

## 2017-12-25 DIAGNOSIS — T161XXA Foreign body in right ear, initial encounter: Secondary | ICD-10-CM | POA: Diagnosis not present

## 2017-12-26 ENCOUNTER — Ambulatory Visit (INDEPENDENT_AMBULATORY_CARE_PROVIDER_SITE_OTHER): Payer: Medicare HMO | Admitting: Cardiology

## 2017-12-26 ENCOUNTER — Encounter: Payer: Self-pay | Admitting: Cardiology

## 2017-12-26 VITALS — BP 178/84 | HR 90 | Ht 68.5 in | Wt 214.0 lb

## 2017-12-26 DIAGNOSIS — I10 Essential (primary) hypertension: Secondary | ICD-10-CM | POA: Diagnosis not present

## 2017-12-26 DIAGNOSIS — R9431 Abnormal electrocardiogram [ECG] [EKG]: Secondary | ICD-10-CM

## 2017-12-26 DIAGNOSIS — I25119 Atherosclerotic heart disease of native coronary artery with unspecified angina pectoris: Secondary | ICD-10-CM | POA: Diagnosis not present

## 2017-12-26 DIAGNOSIS — E782 Mixed hyperlipidemia: Secondary | ICD-10-CM

## 2017-12-26 MED ORDER — AMLODIPINE BESYLATE 5 MG PO TABS: 5.0000 mg | ORAL_TABLET | Freq: Every day | ORAL | 3 refills | Status: DC

## 2017-12-26 NOTE — Progress Notes (Signed)
Clinical Summary Timothy [name] is a 72 y.o.male seen today for follow up of the medical problems.   1. CAD - NSTEMI 06/2014, received PTCA of circumflex as described below. Echo LVEF 50-55%, inferior hypokinesis, grade I diastolic dysfunction. - admit 11/2016 with NSTEMI. Cath as reported below. Received balloon angioplasty of mid to distal LCX, too small to be stented.  - echo 11/2016 LVEF 40-45%.   - denis any recent symptoms.   2. HTN  - does not check at home.  - compliant with meds.    3. Hyperlipidemia - intolerant to statins, has been on zetia only. - labs pending with pcp     Past Medical History:  Diagnosis Date  . Arthritis   . Bladder cancer ([place])   . CAD (coronary artery disease)    a. 06/2014: NSTEMI s/p PTCA of LCx, 60-80% residual in distal LAD, 70% prox small D1.   b. 02/06/15 NSTEMI  s/p successful PCI/DES to mid RCA  . Diabetes mellitus, type 2 ([place])   . Elevated PSA   . HLD (hyperlipidemia)   . HOH (hard of hearing)   . Hypertension   . NSTEMI (non-ST elevated myocardial infarction) ([facility])      Allergies  Allergen Reactions  . Lipitor [Atorvastatin] Other (See Comments)    Myalgia  . Other Other (See Comments)    All Cholesterol Meds - causes muscle and joint pain  . Pravastatin Other (See Comments)    Myalgia     Current Outpatient Medications  Medication Sig Dispense Refill  . aspirin 81 MG chewable tablet Chew 1 tablet (81 mg total) by mouth daily.    . clopidogrel (PLAVIX) 75 MG tablet Take 1 tablet (75 mg total) by mouth daily with breakfast. 30 tablet 6  . glimepiride (AMARYL) 2 MG tablet Take 2 mg by mouth daily.     Marland Kitchen lisinopril (PRINIVIL,ZESTRIL) 40 MG tablet Take 1 tablet (40 mg total) by mouth daily. 30 tablet 0  . metFORMIN (GLUCOPHAGE) 500 MG tablet Take 1 tablet (500 mg total) by mouth 2 (two) times daily with a meal. 60 tablet 11  . metoprolol succinate (TOPROL-XL) 25 MG 24 hr tablet Take 3 tables total 45m dailty 270  tablet 0  . NITROSTAT 0.4 MG SL tablet DISSOLVE 1 TABLET UNDER TONGUE EVERY 5 MINS FOR 3 DOSES AS NEEDED FOR CHEST PAIN 25 tablet 3  . Omega-3 Fatty Acids (FISH OIL) 1000 MG CAPS Take 4 capsules (4,000 mg total) by mouth daily. (Patient taking differently: Take 1,000 mg by mouth daily. )  0  . pantoprazole (PROTONIX) 40 MG tablet TAKE 1 TABLET (40 MG TOTAL) BY MOUTH DAILY. 30 tablet 2  . Polyethyl Glycol-Propyl Glycol (SYSTANE OP) Place 1-2 drops into both eyes daily as needed (dry eyes).      No current facility-administered medications for this visit.      Past Surgical History:  Procedure Laterality Date  . CARDIAC CATHETERIZATION  02/06/2015   Procedure: CORONARY STENT INTERVENTION;  Surgeon: DLeonie Man MD;  [facility];  Service: Cardiovascular;;  Mid RCA  . CARDIAC CATHETERIZATION N/A 11/11/2016   Procedure: Left Heart Cath and Coronary Angiography;  Surgeon: JJettie Booze MD;  [facility];  Service: Cardiovascular;  Laterality: N/A;  . CARDIAC CATHETERIZATION N/A 11/11/2016   Procedure: Coronary Balloon Angioplasty;  Surgeon: JJettie Booze MD;  [facility];  Service: Cardiovascular;  Laterality: N/A;  . CORONARY ANGIOPLASTY  11/11/2016   Mid Cx to Dist Cx lesion, 100 %stenosed. This lesion was treated with balloon angioplasty with a 2.0 balloon  . CYSTOSCOPY W/ RETROGRADES N/A 05/05/2015   Procedure: CYSTOSCOPY WITH BILATERAL RETROGRADE PYELOGRAMS AND BLADDER BIOPSIES;  Surgeon: [name], MD;  Location: [facility];  Service: Urology;  Laterality: N/A;  . LEFT HEART CATHETERIZATION WITH CORONARY ANGIOGRAM N/A 06/17/2014   Procedure: LEFT HEART CATHETERIZATION WITH CORONARY ANGIOGRAM;  Surgeon: [name], MD;  Location: [facility] C[facility] LAB;  Service: Cardiovascular;  Laterality: N/A;  . LEFT HEART CATHETERIZATION WITH CORONARY ANGIOGRAM N/A 02/06/2015   Procedure: LEFT HEART CATHETERIZATION WITH CORONARY ANGIOGRAM;  Surgeon:  [name], MD;  Location: [facility];  Service: Cardiovascular;  Laterality: N/A;  . PROSTATE BIOPSY N/A 03/31/2014   Procedure: BIOPSY TRANSRECTAL ULTRASONIC PROSTATE (TUBP);  Surgeon: [name], MD;  Location: [facility];  Service: Urology;  Laterality: N/A;  . TRANSURETHRAL RESECTION OF BLADDER TUMOR N/A 03/14/2013   Procedure: TRANSURETHRAL RESECTION OF BLADDER TUMOR (TURBT);  Surgeon: [name], MD;  Location: [facility];  Service: Urology;  Laterality: N/A;  1 HR WITH MITOMYCIN INSTILLATION   . TRANSURETHRAL RESECTION OF BLADDER TUMOR N/A 09/30/2013   Procedure: TRANSURETHRAL RESECTION OF BLADDER TUMOR (TURBT);  Surgeon: [name], MD;  Location: [facility];  Service: Urology;  Laterality: N/A;  . TRANSURETHRAL RESECTION OF BLADDER TUMOR N/A 03/31/2014   Procedure: TRANSURETHRAL RESECTION OF BLADDER TUMOR (TURBT);  Surgeon: [name], MD;  Location: [facility];  Service: Urology;  Laterality: N/A;  . TRANSURETHRAL RESECTION OF BLADDER TUMOR N/A 05/31/2015   Procedure: Cystoscopy with clott evacuation and fulgeration of bleeders, retrograde pylegram;  Surgeon: [name], MD;  Location: [facility];  Service: Urology;  Laterality: N/A;     Allergies  Allergen Reactions  . Lipitor [Atorvastatin] Other (See Comments)    Myalgia  . Other Other (See Comments)    All Cholesterol Meds - causes muscle and joint pain  . Pravastatin Other (See Comments)    Myalgia      Family History  Problem Relation Age of Onset  . Cancer Child   . Asthma Mother      Social History Mr. Benzel reports that he quit smoking about 24 years ago. His smoking use included cigarettes. He has a 30.00 pack-year smoking history. he has never used smokeless tobacco. Mr. Emmick reports that he does not drink alcohol.   Review of Systems CONSTITUTIONAL: No weight loss, fever, chills, weakness or fatigue.    HEENT: Eyes: No visual loss, blurred vision, double vision or yellow sclerae.No hearing loss, sneezing, congestion, runny nose or sore throat.  SKIN: No rash or itching.  CARDIOVASCULAR: per hpi RESPIRATORY: No shortness of breath, cough or sputum.  GASTROINTESTINAL: No anorexia, nausea, vomiting or diarrhea. No abdominal pain or blood.  GENITOURINARY: No burning on urination, no polyuria NEUROLOGICAL: No headache, dizziness, syncope, paralysis, ataxia, numbness or tingling in the extremities. No change in bowel or bladder control.  MUSCULOSKELETAL: No muscle, back pain, joint pain or stiffness.  LYMPHATICS: No enlarged nodes. No history of splenectomy.  PSYCHIATRIC: No history of depression or anxiety.  ENDOCRINOLOGIC: No reports of sweating, cold or heat intolerance. No polyuria or polydipsia.  Marland Kitchen   Physical Examination Vitals:   12/26/17 1312  BP: (!) 178/84  Pulse: 90  SpO2: 97%   Vitals:   12/26/17 1312  Weight: 214 lb (97.1 kg)  Height: 5' 8.5" (1.74 m)  Gen: resting comfortably, no acute distress HEENT: no scleral icterus, pupils equal round and reactive, no palptable cervical adenopathy,  CV: RRR, no m/r/g, no jvd Resp: Clear to auscultation bilaterally GI: abdomen is soft, non-tender, non-distended, normal bowel sounds, no hepatosplenomegaly MSK: extremities are warm, no edema.  Skin: warm, no rash Neuro:  no focal deficits Psych: appropriate affect   Diagnostic Studies 06/2014 Cath FINDINGS: Hemodynamics:  Central Aortic Pressure / Mean: 108/62/83 mmHg  Left Ventricular Pressure / LVEDP: 107/7/17 mmHg  Left Ventriculography:  EF:~45% %  Wall Motion:global Hypokinesis  Coronary Anatomy:  Dominance: Right  Left Main: Large caliber, long trunk that trifurcates into the LAD, Circumflex & Ramus Intermedius. Angiographically normal. JKK:XFGHWE as a large caliber vessel that tapers into a relatively small caliber (~1.5-2.0 mm) vessel distally.  It wraps the apex, perfusing the distal 1/3 of the infero-apex. The distal vessel has tandem 60&70-80% focal lesions prior to the apex.  D1:Very small caliber bifurcating vessel with proximal ~70% stenosis.  Left Circumflex:Begins as a large caliber vessel that also tapers to a small to moderate caliber vessel in the AV Groove where it gives off a small caliber OM1 branch and is 100% occluded just beyond that point.   Post-PTCA: beyond the occluded AV Groove Circumflex, there is a small to moderate caliber LPL branch.  Ramus intermedius:Large caliber vessel that bifurcates in the mid-vessel into to small to moderate caliber branches that both reach the apex.   RCA: Large caliber, dominant vessel with diffuse ~20-40% lesions, but no obstructive disease. It bifurcates distally in to a small caliber, short PDA and Right Posterior AV Groove Branch (RPAV) branches. RPAV gives off several very small banches.  After reviewing the initial angiography, the culprit lesion was thought to be the occluded distal-AV Groove circumflesx. Preparation were made to proceed with PTCA on this lesion.  Percutaneous Coronary Intervention:  Guide: 6 Fr CLS 3.5 -- very difficult guide support, catheter moved &disengaged with patient movement Guidewire: BMW (advanced initially in to an atrial branch for support & balloon advanced into proximal Circumflex allowing for further advancmemt of the wire beyond the culprit lesion. Predilation Balloon #1: Emerge 1.5 mm x 12 mm; Used to assist with wire support to advance guidewire ? 6 Atm x 30 Sec, 8 Atm x 30 Sec ? Post inflation revealed a small to moderate follow-on circumflex with LPL branch.  ? Due to the relatively small sized distal vessel, the decision was to perform PTCA only. Balloon #2: Euphora 2.0 mm x 12 mm;  ? 8 Atm x 90 Sec x 2 with IC NTG - no recoil ? Final Diameter: ~2.0 mm  Post deployment angiography in multiple views, with and  without guidewire in place revealed excellent stent deployment and lesion coverage. There was no evidence of dissection or perforation.  POST-OPERATIVE DIAGNOSIS:   Severe 2 vessel disease in small caliber vessel - distal AV Groove Circumflex 100% and distal LAD ~60&80% lesions (1.5 mm vessel)  Successful PTCA of the occluded AV Groove Circumflex with Stent-like result.  Moderately reduced LVEF by LV Gram  Normal LVEDP   06/2014 Echo Study Conclusions  - Left ventricle: The cavity size was normal. Wall thickness was normal. Systolic function was normal. The estimated ejection fraction was in the range of 50% to 55%. Inferior hypokinesis. Doppler parameters are consistent with abnormal left ventricular relaxation (grade 1 diastolic dysfunction). The E/e&' ratio is 15, suggesting borderline elevated LV filling pressure. - Aortic valve: Trileaflet. Sclerosis without stenosis. There  was no regurgitation. - Left atrium: The atrium was normal in size. - Tricuspid valve: There was mild regurgitation. - Pulmonary arteries: PA peak pressure: 33 mm Hg (S).  Impressions:  - LVEF 50-55%, inferior hypokinesis, mild TR, top normal RVSP, diastolic dysfunction with borderline elevated LV filling pressure.   11/2016 echo Study Conclusions  - Left ventricle: The cavity size was normal. Wall thickness was normal. Systolic function was mildly to moderately reduced. The estimated ejection fraction was in the range of 40% to 45%. There is akinesis of the basal-midinferior and inferoseptal myocardium. Doppler parameters are consistent with abnormal left ventricular relaxation (grade 1 diastolic dysfunction). - Aortic valve: Trileaflet; mildly thickened, mildly calcified leaflets. There was trivial regurgitation.  Impressions:  - EF is mildly reduced when compared to prior.   11/2016 cath  There is mild left ventricular systolic dysfunction.  The  left ventricular ejection fraction is 45-50% by visual estimate.  Mid RCA-2 lesion, 25 %stenosed.  Patent mid RCA stent.  Ost 2nd Diag to 2nd Diag lesion, 80 %stenosed. Unchanged from prior.  Dist LAD lesion, 60 %stenosed. Unchanged from prior.  Mid Cx to Dist Cx lesion, 100 %stenosed. This lesion was treated with balloon angioplasty with a 2.0 balloon. Several inflations were performed.  Post intervention, there is a 0% residual stenosis. The vessel was too small to be stented. There is also significant tortuosity in the proximal vessel.  LV end diastolic pressure is mildly elevated.  Initially, Brilinta was given for antiplatelet therapy in the Cath Lab. Since he did not receive a stent, we will transition him to clopidogrel. He needs aggressive secondary prevention including lipid-lowering therapy. He has been intolerant to statins in the past. Would need to consider PCS K9 inhibitor along with other secondary prevention. I anticipate he'll be here 2 days in the hospital. Echocardiogram would be reasonable to check ejection fraction and mitral apparatus.  LAD and diagonal disease was not addressed since they appeared unchanged angiographically. If he has refractory angina, could address the LAD lesion at a later time.  Jan 2018 AAA [place] No AAA    Assessment and Plan  1. CAD - no recent symptoms, continue current meds - EKG today shows new inferior and lateral precordial TWIs, and lateral Qwaves. No recent symptoms but due to significant changes will obtain exercise nuclear stress test  2. HTN - above goal, start norvasc 28m daily.   3. Hyperlipidemia - intolerant to statins. Contniue dietary modification. His lipids have been ok off therapy.     F/u pending stress results  JArnoldo Lenis M.D.

## 2017-12-26 NOTE — Patient Instructions (Signed)
Medication Instructions:  STOP PLAVIX START NORVASC 5 MG DAILY   Labwork: NONE  Testing/Procedures: Your physician has requested that you have en exercise stress myoview. For further information please visit [email or web address]. Please follow instruction sheet, as given.    Follow-Up: Your physician recommends that you schedule a follow-up appointment in: TO BE DETERMINED BASED ON TEST    Any Other Special Instructions Will Be Listed Below (If Applicable).     If you need a refill on your cardiac medications before your next appointment, please call your pharmacy.

## 2017-12-27 DIAGNOSIS — N183 Chronic kidney disease, stage 3 (moderate): Secondary | ICD-10-CM | POA: Diagnosis not present

## 2017-12-27 DIAGNOSIS — E559 Vitamin D deficiency, unspecified: Secondary | ICD-10-CM | POA: Diagnosis not present

## 2017-12-27 DIAGNOSIS — E870 Hyperosmolality and hypernatremia: Secondary | ICD-10-CM | POA: Diagnosis not present

## 2018-01-01 ENCOUNTER — Encounter: Payer: Self-pay | Admitting: Cardiology

## 2018-01-01 DIAGNOSIS — M7661 Achilles tendinitis, right leg: Secondary | ICD-10-CM | POA: Diagnosis not present

## 2018-01-01 DIAGNOSIS — M25774 Osteophyte, right foot: Secondary | ICD-10-CM | POA: Diagnosis not present

## 2018-01-01 DIAGNOSIS — M7662 Achilles tendinitis, left leg: Secondary | ICD-10-CM | POA: Diagnosis not present

## 2018-01-01 DIAGNOSIS — M25775 Osteophyte, left foot: Secondary | ICD-10-CM | POA: Diagnosis not present

## 2018-01-02 ENCOUNTER — Ambulatory Visit (HOSPITAL_COMMUNITY)
Admission: RE | Admit: 2018-01-02 | Discharge: 2018-01-02 | Disposition: A | Discharge status: Home / Self Care | Payer: Medicare HMO | Source: Ambulatory Visit | Attending: Cardiology | Admitting: Cardiology

## 2018-01-02 ENCOUNTER — Encounter (HOSPITAL_COMMUNITY): Payer: Self-pay

## 2018-01-02 ENCOUNTER — Encounter (HOSPITAL_COMMUNITY)
Admission: RE | Admit: 2018-01-02 | Discharge: 2018-01-02 | Disposition: A | Discharge status: Home / Self Care | Payer: Medicare HMO | Source: Ambulatory Visit | Attending: Cardiology | Admitting: Cardiology

## 2018-01-02 DIAGNOSIS — R9431 Abnormal electrocardiogram [ECG] [EKG]: Secondary | ICD-10-CM | POA: Diagnosis not present

## 2018-01-02 LAB — NM MYOCAR MULTI W/SPECT W/WALL MOTION / EF
CHL CUP MPHR: 150 {beats}/min
CHL CUP NUCLEAR SSS: 11
CHL CUP RESTING HR STRESS: 61 {beats}/min
CSEPED: 4 min
CSEPEDS: 30 s
CSEPHR: 94 %
Estimated workload: 7 METS
LV sys vol: 103 mL
LVDIAVOL: 137 mL (ref 62–150)
Peak HR: 141 {beats}/min
RATE: 0.37
RPE: 20
SDS: 4
SRS: 7
TID: 1.22

## 2018-01-02 MED ORDER — TECHNETIUM TC 99M TETROFOSMIN IV KIT
10.0000 | PACK | Freq: Once | INTRAVENOUS | Status: AC | PRN
  Administered 2018-01-02: 10 via INTRAVENOUS

## 2018-01-02 MED ORDER — REGADENOSON 0.4 MG/5ML IV SOLN
INTRAVENOUS | Status: AC
  Filled 2018-01-02: qty 5

## 2018-01-02 MED ORDER — TECHNETIUM TC 99M TETROFOSMIN IV KIT
30.0000 | PACK | Freq: Once | INTRAVENOUS | Status: AC | PRN
  Administered 2018-01-02: 30 via INTRAVENOUS

## 2018-01-02 MED ORDER — SODIUM CHLORIDE 0.9% FLUSH
INTRAVENOUS | Status: AC
  Administered 2018-01-02: 10 mL via INTRAVENOUS
  Filled 2018-01-02: qty 10

## 2018-01-03 ENCOUNTER — Telehealth: Payer: Self-pay

## 2018-01-03 DIAGNOSIS — I2510 Atherosclerotic heart disease of native coronary artery without angina pectoris: Secondary | ICD-10-CM

## 2018-01-03 NOTE — Telephone Encounter (Signed)
Called pt., no answer. Left message for pt to return call.  

## 2018-01-03 NOTE — Telephone Encounter (Signed)
-----   Message from Arnoldo Lenis, MD sent at 01/03/2018 12:36 PM EST ----- Stress test shows a small abnormal area that may represent a small blockage. The blockage itself is considered to be low risk since its so small. The test does suggest however that the pumping function of his heart may be decreased, can we order an echo for him for CAD   Zandra Abts MD

## 2018-01-18 ENCOUNTER — Other Ambulatory Visit: Payer: Self-pay | Admitting: Cardiology

## 2018-02-12 ENCOUNTER — Other Ambulatory Visit: Payer: Self-pay

## 2018-02-12 MED ORDER — LISINOPRIL 40 MG PO TABS: 40.0000 mg | ORAL_TABLET | Freq: Every day | ORAL | 6 refills | Status: DC

## 2018-02-12 NOTE — Telephone Encounter (Signed)
Refilled lisinopril

## 2018-02-16 ENCOUNTER — Telehealth: Payer: Self-pay | Admitting: Cardiology

## 2018-02-16 NOTE — Telephone Encounter (Signed)
Pt states that he had problems w/ amLODipine (NORVASC) 5 MG tablet [[id number]] made his feet hurt and had to go to the foot Dr.

## 2018-02-16 NOTE — Telephone Encounter (Signed)
Returned pt call. No answer. Left message for pt to return call.  

## 2018-02-19 NOTE — Telephone Encounter (Signed)
Returned pt call. No answer. Unable to leave voicemail.

## 2018-02-21 ENCOUNTER — Ambulatory Visit (HOSPITAL_COMMUNITY)
Admission: RE | Admit: 2018-02-21 | Discharge: 2018-02-21 | Disposition: A | Discharge status: Home / Self Care | Payer: Medicare HMO | Source: Ambulatory Visit | Attending: Cardiology | Admitting: Cardiology

## 2018-02-21 DIAGNOSIS — I420 Dilated cardiomyopathy: Secondary | ICD-10-CM | POA: Insufficient documentation

## 2018-02-21 DIAGNOSIS — I2510 Atherosclerotic heart disease of native coronary artery without angina pectoris: Secondary | ICD-10-CM | POA: Diagnosis not present

## 2018-02-21 NOTE — Progress Notes (Signed)
*  PRELIMINARY RESULTS* Echocardiogram 2D Echocardiogram has been performed.  Samuel Germany 02/21/2018, 10:15 AM

## 2018-02-28 ENCOUNTER — Other Ambulatory Visit: Payer: Self-pay | Admitting: Cardiology

## 2018-04-17 ENCOUNTER — Ambulatory Visit: Payer: Medicare HMO | Admitting: Urology

## 2018-05-07 DIAGNOSIS — E782 Mixed hyperlipidemia: Secondary | ICD-10-CM | POA: Diagnosis not present

## 2018-05-07 DIAGNOSIS — I2510 Atherosclerotic heart disease of native coronary artery without angina pectoris: Secondary | ICD-10-CM | POA: Diagnosis not present

## 2018-05-07 DIAGNOSIS — I739 Peripheral vascular disease, unspecified: Secondary | ICD-10-CM | POA: Diagnosis not present

## 2018-05-07 DIAGNOSIS — Z1389 Encounter for screening for other disorder: Secondary | ICD-10-CM | POA: Diagnosis not present

## 2018-05-07 DIAGNOSIS — I10 Essential (primary) hypertension: Secondary | ICD-10-CM | POA: Diagnosis not present

## 2018-05-07 DIAGNOSIS — Z6832 Body mass index (BMI) 32.0-32.9, adult: Secondary | ICD-10-CM | POA: Diagnosis not present

## 2018-05-07 DIAGNOSIS — E6609 Other obesity due to excess calories: Secondary | ICD-10-CM | POA: Diagnosis not present

## 2018-05-07 DIAGNOSIS — E1129 Type 2 diabetes mellitus with other diabetic kidney complication: Secondary | ICD-10-CM | POA: Diagnosis not present

## 2018-06-06 ENCOUNTER — Other Ambulatory Visit: Payer: Self-pay | Admitting: Cardiology

## 2018-06-12 ENCOUNTER — Other Ambulatory Visit (HOSPITAL_COMMUNITY)
Admission: RE | Admit: 2018-06-12 | Discharge: 2018-06-12 | Disposition: A | Discharge status: Home / Self Care | Payer: Medicare HMO | Source: Other Acute Inpatient Hospital | Attending: Urology | Admitting: Urology

## 2018-06-12 ENCOUNTER — Ambulatory Visit: Payer: Medicare HMO | Admitting: Urology

## 2018-06-12 DIAGNOSIS — C678 Malignant neoplasm of overlapping sites of bladder: Secondary | ICD-10-CM

## 2018-06-12 DIAGNOSIS — N401 Enlarged prostate with lower urinary tract symptoms: Secondary | ICD-10-CM | POA: Diagnosis not present

## 2018-06-12 DIAGNOSIS — C61 Malignant neoplasm of prostate: Secondary | ICD-10-CM

## 2018-06-12 DIAGNOSIS — R351 Nocturia: Secondary | ICD-10-CM | POA: Diagnosis not present

## 2018-06-12 DIAGNOSIS — R82998 Other abnormal findings in urine: Secondary | ICD-10-CM | POA: Diagnosis not present

## 2018-06-19 ENCOUNTER — Ambulatory Visit: Payer: Medicare HMO | Admitting: Cardiology

## 2018-06-19 NOTE — Progress Notes (Deleted)
Clinical Summary Timothy [name] is a 72 y.o.male seen today for follow up of the medical problems.   1. CAD - NSTEMI 06/2014, received PTCA of circumflex as described below. Echo LVEF 50-55%, inferior hypokinesis, grade I diastolic dysfunction. - admit 11/2016 with NSTEMI. Cath as reported below. Received balloon angioplasty of mid to distal LCX, too small to be stented.  - echo 11/2016 LVEF 40-45%.  - denis any recent symptoms.   02/2018 echo LVEF 40-45% Jan 2019 nuclear stress: Duke score 4.5, mild apical ischemia, LVEF 25%.   2. HTN  - does not check at home.  - compliant with meds.    3. Hyperlipidemia - intolerant to statins, has been on zetia only. - labs pending with pcp     Past Medical History:  Diagnosis Date  . Arthritis   . Bladder cancer ([place])   . CAD (coronary artery disease)    a. 06/2014: NSTEMI s/p PTCA of LCx, 60-80% residual in distal LAD, 70% prox small D1.   b. 02/06/15 NSTEMI  s/p successful PCI/DES to mid RCA  . Diabetes mellitus, type 2 ([place])   . Elevated PSA   . HLD (hyperlipidemia)   . HOH (hard of hearing)   . Hypertension   . NSTEMI (non-ST elevated myocardial infarction) ([facility])      Allergies  Allergen Reactions  . Lipitor [Atorvastatin] Other (See Comments)    Myalgia  . Other Other (See Comments)    All Cholesterol Meds - causes muscle and joint pain  . Pravastatin Other (See Comments)    Myalgia     Current Outpatient Medications  Medication Sig Dispense Refill  . amLODipine (NORVASC) 5 MG tablet Take 1 tablet (5 mg total) by mouth daily. 180 tablet 3  . aspirin 81 MG chewable tablet Chew 1 tablet (81 mg total) by mouth daily.    Marland Kitchen glimepiride (AMARYL) 2 MG tablet Take 2 mg by mouth daily.     Marland Kitchen lisinopril (PRINIVIL,ZESTRIL) 40 MG tablet Take 1 tablet (40 mg total) by mouth daily. 30 tablet 6  . metFORMIN (GLUCOPHAGE) 500 MG tablet Take 1 tablet (500 mg total) by mouth 2 (two) times daily with a meal. 60 tablet 11  .  metoprolol succinate (TOPROL-XL) 25 MG 24 hr tablet TAKE 3 TABLES TOTAL DAILY 270 tablet 3  . NITROSTAT 0.4 MG SL tablet DISSOLVE 1 TABLET UNDER TONGUE EVERY 5 MINS FOR 3 DOSES AS NEEDED FOR CHEST PAIN 25 tablet 3  . Omega-3 Fatty Acids (FISH OIL) 1000 MG CAPS Take 4 capsules (4,000 mg total) by mouth daily. (Patient taking differently: Take 1,000 mg by mouth daily. )  0  . pantoprazole (PROTONIX) 40 MG tablet TAKE 1 TABLET (40 MG TOTAL) BY MOUTH DAILY. 30 tablet 2  . Polyethyl Glycol-Propyl Glycol (SYSTANE OP) Place 1-2 drops into both eyes daily as needed (dry eyes).      No current facility-administered medications for this visit.      Past Surgical History:  Procedure Laterality Date  . CARDIAC CATHETERIZATION  02/06/2015   Procedure: CORONARY STENT INTERVENTION;  Surgeon: [name], MD;  Location: [facility] CATH LAB;  Service: Cardiovascular;;  Mid RCA  . CARDIAC CATHETERIZATION N/A 11/11/2016   Procedure: Left Heart Cath and Coronary Angiography;  Surgeon: [name], MD;  Location: [place] CV LAB;  Service: Cardiovascular;  Laterality: N/A;  . CARDIAC CATHETERIZATION N/A 11/11/2016   Procedure: Coronary Balloon Angioplasty;  Surgeon: [name], MD;  Location:  [place] INVASIVE CV LAB;  Service: Cardiovascular;  Laterality: N/A;  . CORONARY ANGIOPLASTY  11/11/2016   Mid Cx to Dist Cx lesion, 100 %stenosed. This lesion was treated with balloon angioplasty with a 2.0 balloon  . CYSTOSCOPY W/ RETROGRADES N/A 05/05/2015   Procedure: CYSTOSCOPY WITH BILATERAL RETROGRADE PYELOGRAMS AND BLADDER BIOPSIES;  Surgeon: [name], MD;  Location: [facility];  Service: Urology;  Laterality: N/A;  . LEFT HEART CATHETERIZATION WITH CORONARY ANGIOGRAM N/A 06/17/2014   Procedure: LEFT HEART CATHETERIZATION WITH CORONARY ANGIOGRAM;  Surgeon: [name], MD;  Location: [facility] CATH LAB;  Service: Cardiovascular;  Laterality: N/A;  . LEFT HEART CATHETERIZATION WITH CORONARY ANGIOGRAM N/A  02/06/2015   Procedure: LEFT HEART CATHETERIZATION WITH CORONARY ANGIOGRAM;  Surgeon: [name], MD;  Location: [facility] CATH LAB;  Service: Cardiovascular;  Laterality: N/A;  . PROSTATE BIOPSY N/A 03/31/2014   Procedure: BIOPSY TRANSRECTAL ULTRASONIC PROSTATE (TUBP);  Surgeon: [name], MD;  Location: [facility];  Service: Urology;  Laterality: N/A;  . TRANSURETHRAL RESECTION OF BLADDER TUMOR N/A 03/14/2013   Procedure: TRANSURETHRAL RESECTION OF BLADDER TUMOR (TURBT);  Surgeon: [name], MD;  Location: [facility];  Service: Urology;  Laterality: N/A;  1 HR WITH MITOMYCIN INSTILLATION   . TRANSURETHRAL RESECTION OF BLADDER TUMOR N/A 09/30/2013   Procedure: TRANSURETHRAL RESECTION OF BLADDER TUMOR (TURBT);  Surgeon: [name], MD;  Location: [facility];  Service: Urology;  Laterality: N/A;  . TRANSURETHRAL RESECTION OF BLADDER TUMOR N/A 03/31/2014   Procedure: TRANSURETHRAL RESECTION OF BLADDER TUMOR (TURBT);  Surgeon: [name], MD;  Location: [facility];  Service: Urology;  Laterality: N/A;  . TRANSURETHRAL RESECTION OF BLADDER TUMOR N/A 05/31/2015   Procedure: Cystoscopy with clott evacuation and fulgeration of bleeders, retrograde pylegram;  Surgeon: [name], MD;  Location: [facility] ORS;  Service: Urology;  Laterality: N/A;     Allergies  Allergen Reactions  . Lipitor [Atorvastatin] Other (See Comments)    Myalgia  . Other Other (See Comments)    All Cholesterol Meds - causes muscle and joint pain  . Pravastatin Other (See Comments)    Myalgia      Family History  Problem Relation Age of Onset  . Cancer Child   . Asthma Mother      Social History Mr. Portocarrero reports that he quit smoking about 25 years ago. His smoking use included cigarettes. He has a 30.00 pack-year smoking history. He has never used smokeless tobacco. Mr. Stormont reports that he does not drink  alcohol.   Review of Systems CONSTITUTIONAL: No weight loss, fever, chills, weakness or fatigue.  HEENT: Eyes: No visual loss, blurred vision, double vision or yellow sclerae.No hearing loss, sneezing, congestion, runny nose or sore throat.  SKIN: No rash or itching.  CARDIOVASCULAR:  RESPIRATORY: No shortness of breath, cough or sputum.  GASTROINTESTINAL: No anorexia, nausea, vomiting or diarrhea. No abdominal pain or blood.  GENITOURINARY: No burning on urination, no polyuria NEUROLOGICAL: No headache, dizziness, syncope, paralysis, ataxia, numbness or tingling in the extremities. No change in bowel or bladder control.  MUSCULOSKELETAL: No muscle, back pain, joint pain or stiffness.  LYMPHATICS: No enlarged nodes. No history of splenectomy.  PSYCHIATRIC: No history of depression or anxiety.  ENDOCRINOLOGIC: No reports of sweating, cold or heat intolerance. No polyuria or polydipsia.  Marland Kitchen   Physical Examination There were no vitals filed for this visit. There were no vitals filed for this visit.  Gen: resting comfortably, no  acute distress HEENT: no scleral icterus, pupils equal round and reactive, no palptable cervical adenopathy,  CV Resp: Clear to auscultation bilaterally GI: abdomen is soft, non-tender, non-distended, normal bowel sounds, no hepatosplenomegaly MSK: extremities are warm, no edema.  Skin: warm, no rash Neuro:  no focal deficits Psych: appropriate affect   Diagnostic Studies 06/2014 Cath FINDINGS: Hemodynamics:  Central Aortic Pressure / Mean: 108/62/83 mmHg  Left Ventricular Pressure / LVEDP: 107/7/17 mmHg  Left Ventriculography:  EF:~45% %  Wall Motion:global Hypokinesis  Coronary Anatomy:  Dominance: Right  Left Main: Large caliber, long trunk that trifurcates into the LAD, Circumflex & Ramus Intermedius. Angiographically normal. ZHG:DJMEQA as a large caliber vessel that tapers into a relatively small caliber (~1.5-2.0 mm) vessel  distally. It wraps the apex, perfusing the distal 1/3 of the infero-apex. The distal vessel has tandem 60&70-80% focal lesions prior to the apex.  D1:Very small caliber bifurcating vessel with proximal ~70% stenosis.  Left Circumflex:Begins as a large caliber vessel that also tapers to a small to moderate caliber vessel in the AV Groove where it gives off a small caliber OM1 Johany Hansman and is 100% occluded just beyond that point.   Post-PTCA: beyond the occluded AV Groove Circumflex, there is a small to moderate caliber LPL Deklin Bieler.  Ramus intermedius:Large caliber vessel that bifurcates in the mid-vessel into to small to moderate caliber branches that both reach the apex.   RCA: Large caliber, dominant vessel with diffuse ~20-40% lesions, but no obstructive disease. It bifurcates distally in to a small caliber, short PDA and Right Posterior AV Groove Rajah Lamba (RPAV) branches. RPAV gives off several very small banches.  After reviewing the initial angiography, the culprit lesion was thought to be the occluded distal-AV Groove circumflesx. Preparation were made to proceed with PTCA on this lesion.  Percutaneous Coronary Intervention:  Guide: 6 Fr CLS 3.5 -- very difficult guide support, catheter moved &disengaged with patient movement Guidewire: BMW (advanced initially in to an atrial Kaliegh Willadsen for support & balloon advanced into proximal Circumflex allowing for further advancmemt of the wire beyond the culprit lesion. Predilation Balloon #1: Emerge 1.5 mm x 12 mm; Used to assist with wire support to advance guidewire ? 6 Atm x 30 Sec, 8 Atm x 30 Sec ? Post inflation revealed a small to moderate follow-on circumflex with LPL Rion Schnitzer.  ? Due to the relatively small sized distal vessel, the decision was to perform PTCA only. Balloon #2: Euphora 2.0 mm x 12 mm;  ? 8 Atm x 90 Sec x 2 with IC NTG - no recoil ? Final Diameter: ~2.0 mm  Post deployment angiography in multiple views,  with and without guidewire in place revealed excellent stent deployment and lesion coverage. There was no evidence of dissection or perforation.  POST-OPERATIVE DIAGNOSIS:   Severe 2 vessel disease in small caliber vessel - distal AV Groove Circumflex 100% and distal LAD ~60&80% lesions (1.5 mm vessel)  Successful PTCA of the occluded AV Groove Circumflex with Stent-like result.  Moderately reduced LVEF by LV Gram  Normal LVEDP   06/2014 Echo Study Conclusions  - Left ventricle: The cavity size was normal. Wall thickness was normal. Systolic function was normal. The estimated ejection fraction was in the range of 50% to 55%. Inferior hypokinesis. Doppler parameters are consistent with abnormal left ventricular relaxation (grade 1 diastolic dysfunction). The E/e&' ratio is 15, suggesting borderline elevated LV filling pressure. - Aortic valve: Trileaflet. Sclerosis without stenosis. There was no regurgitation. - Left atrium: The atrium was  normal in size. - Tricuspid valve: There was mild regurgitation. - Pulmonary arteries: PA peak pressure: 33 mm Hg (S).  Impressions:  - LVEF 50-55%, inferior hypokinesis, mild TR, top normal RVSP, diastolic dysfunction with borderline elevated LV filling pressure.   11/2016 echo Study Conclusions  - Left ventricle: The cavity size was normal. Wall thickness was normal. Systolic function was mildly to moderately reduced. The estimated ejection fraction was in the range of 40% to 45%. There is akinesis of the basal-midinferior and inferoseptal myocardium. Doppler parameters are consistent with abnormal left ventricular relaxation (grade 1 diastolic dysfunction). - Aortic valve: Trileaflet; mildly thickened, mildly calcified leaflets. There was trivial regurgitation.  Impressions:  - EF is mildly reduced when compared to prior.   11/2016 cath  There is mild left ventricular systolic  dysfunction.  The left ventricular ejection fraction is 45-50% by visual estimate.  Mid RCA-2 lesion, 25 %stenosed.  Patent mid RCA stent.  Ost 2nd Diag to 2nd Diag lesion, 80 %stenosed. Unchanged from prior.  Dist LAD lesion, 60 %stenosed. Unchanged from prior.  Mid Cx to Dist Cx lesion, 100 %stenosed. This lesion was treated with balloon angioplasty with a 2.0 balloon. Several inflations were performed.  Post intervention, there is a 0% residual stenosis. The vessel was too small to be stented. There is also significant tortuosity in the proximal vessel.  LV end diastolic pressure is mildly elevated.  Initially, Brilinta was given for antiplatelet therapy in the Cath Lab. Since he did not receive a stent, we will transition him to clopidogrel. He needs aggressive secondary prevention including lipid-lowering therapy. He has been intolerant to statins in the past. Would need to consider PCS K9 inhibitor along with other secondary prevention. I anticipate he'll be here 2 days in the hospital. Echocardiogram would be reasonable to check ejection fraction and mitral apparatus.  LAD and diagonal disease was not addressed since they appeared unchanged angiographically. If he has refractory angina, could address the LAD lesion at a later time.  Jan 2018 AAA [place] No AAA   Jan 2019 nuclear stress  No diagnostic ST segment changes. Occasional PVCs noted throughout the study. Hypertensive response. Technically low risk Duke treadmill score 4.5.  Blood pressure demonstrated a hypertensive response to exercise.  Small, moderate intensity, partially reversible mid to apical anterior defect consistent with ischemia.  This is a high risk study predominantly based on calculated LVEF. Consider echocardiogram for further confirmation.  Nuclear stress EF: 25%.   02/2018 echo Study Conclusions  - Left ventricle: Mid and basal inferior wall hypokinesis. The   cavity size was mildly  dilated. Wall thickness was normal.   Systolic function was mildly to moderately reduced. The estimated   ejection fraction was in the range of 40% to 45%. - Aortic valve: There was trivial regurgitation. Valve area (VTI):   1.51 cm^2. Valve area (Vmax): 1.75 cm^2. Valve area (Vmean): 1.64   cm^2. - Atrial septum: No defect or patent foramen ovale was identified. - Pulmonary arteries: PA peak pressure: 37 mm Hg (S).  Assessment and Plan   1. CAD - no recent symptoms, continue current meds - EKG today shows new inferior and lateral precordial TWIs, and lateral Qwaves. No recent symptoms but due to significant changes will obtain exercise nuclear stress test  2. HTN - above goal, start norvasc '5mg'$  daily.   3. Hyperlipidemia - intolerant to statins. Contniue dietary modification. His lipids have been ok off therapy.     F/u pending stress results  Arnoldo Lenis, M.D., F.A.C.C.

## 2018-06-26 DIAGNOSIS — R69 Illness, unspecified: Secondary | ICD-10-CM | POA: Diagnosis not present

## 2018-07-10 DIAGNOSIS — I739 Peripheral vascular disease, unspecified: Secondary | ICD-10-CM | POA: Diagnosis not present

## 2018-07-10 DIAGNOSIS — M545 Low back pain: Secondary | ICD-10-CM | POA: Diagnosis not present

## 2018-07-10 DIAGNOSIS — E6609 Other obesity due to excess calories: Secondary | ICD-10-CM | POA: Diagnosis not present

## 2018-07-10 DIAGNOSIS — Z6832 Body mass index (BMI) 32.0-32.9, adult: Secondary | ICD-10-CM | POA: Diagnosis not present

## 2018-07-10 DIAGNOSIS — E1129 Type 2 diabetes mellitus with other diabetic kidney complication: Secondary | ICD-10-CM | POA: Diagnosis not present

## 2018-07-12 ENCOUNTER — Other Ambulatory Visit (HOSPITAL_COMMUNITY): Payer: Self-pay | Admitting: Family Medicine

## 2018-07-12 DIAGNOSIS — I739 Peripheral vascular disease, unspecified: Secondary | ICD-10-CM

## 2018-07-16 ENCOUNTER — Ambulatory Visit (HOSPITAL_COMMUNITY)
Admission: RE | Admit: 2018-07-16 | Discharge: 2018-07-16 | Disposition: A | Discharge status: Home / Self Care | Payer: Medicare HMO | Source: Ambulatory Visit | Attending: Family Medicine | Admitting: Family Medicine

## 2018-07-16 DIAGNOSIS — I739 Peripheral vascular disease, unspecified: Secondary | ICD-10-CM | POA: Insufficient documentation

## 2018-07-19 ENCOUNTER — Other Ambulatory Visit: Payer: Self-pay

## 2018-07-19 DIAGNOSIS — M79604 Pain in right leg: Secondary | ICD-10-CM

## 2018-07-19 DIAGNOSIS — M79605 Pain in left leg: Principal | ICD-10-CM

## 2018-07-24 ENCOUNTER — Other Ambulatory Visit: Payer: Self-pay | Admitting: Urology

## 2018-07-27 NOTE — Patient Instructions (Signed)
Timothy [name]  07/27/2018     @PREFPERIOPPHARMACY @   Your procedure is scheduled on  07/31/2018 .  Report to [facility] at  615   A.M.  Call this number if you have problems the morning of surgery:  [phone]   Remember:  Do not eat or drink after midnight.  You may drink clear liquids until  12 midnight 07/30/2018 .  Clear liquids allowed are:                    Water, Juice (non-citric and without pulp), Carbonated beverages, Clear Tea, Black Coffee only, Plain Jell-O only, Gatorade and Plain Popsicles only    Take these medicines the morning of surgery with A SIP OF WATER  Amlodipine, lisinopril, metoprolol, protonix.    Do not wear jewelry, make-up or nail polish.  Do not wear lotions, powders, or perfumes, or deodorant.  Do not shave 48 hours prior to surgery.  Men may shave face and neck.  Do not bring valuables to the hospital.  [facility] is not responsible for any belongings or valuables.  Contacts, dentures or bridgework may not be worn into surgery.  Leave your suitcase in the car.  After surgery it may be brought to your room.  For patients admitted to the hospital, discharge time will be determined by your treatment team.  Patients discharged the day of surgery will not be allowed to drive home.   Name and phone number of your driver:   family Special instructions:  None  Please read over the following fact sheets that you were given. Anesthesia Post-op Instructions and Care and Recovery After Surgery       Bladder Biopsy A bladder biopsy is a procedure to remove a small sample of tissue from the bladder. The procedure is done so that the tissue can be examined under a microscope. You may have a bladder biopsy to diagnose or rule out bladder cancer. During your bladder biopsy, your health care provider may insert a long, thin scope with a tiny lighted camera (cystoscope) into the tube that leads from your bladder to the outside of  your body (urethra) and move it into your bladder. The cystoscope will allow your health care provider to examine the lining of the urethra and bladder and remove the tissue sample. If your health provider also needs to examine the tubes that connect the kidneys to the bladder (ureters), a longer tube (ureteroscope) may be used. Tell a health care provider about:  Any allergies you have.  All medicines you are taking, including vitamins, herbs, eye drops, creams, and over-the-counter medicines.  Any problems you or family members have had with anesthetic medicines.  Any blood disorders you have.  Any surgeries you have had.  Any medical conditions you have.  Whether you are pregnant or may be pregnant. What are the risks? Generally, this is a safe procedure. However, problems may occur, including:  Unusual bleeding.  Infection, especially a urinary tract infection (UTI).  Allergic reactions to medicines.  Damage to the surrounding organs, including the urethra, bladder, or ureters.  Abdominal pain.  Burning or pain during urination.  Narrowing of the urethra due to scar tissue.  Difficulty urinating due to swelling.  What happens before the procedure?  You may be given antibiotic medicine to help prevent infection.  Ask your health care provider about: ? Changing or stopping your regular medicines.  This is especially important if you are taking diabetes medicines or blood thinners. ? Taking medicines such as aspirin and ibuprofen. These medicines can thin your blood. Do not take these medicines before your procedure if your health care provider instructs you not to.  Follow instructions from your health care provider about eating and drinking restrictions.  You may be asked to drink plenty of fluids.  You may be asked to urinate right before the procedure. You may have a urine sample taken for UTI testing.  Plan to have someone take you home from the hospital or  clinic.  If you will be going home right after the procedure, plan to have someone with you for 24 hours. What happens during the procedure?  To lower your risk of infection: ? Your health care team will wash or sanitize their hands. ? Your skin will be washed with soap.  An IV tube may be inserted into one of your veins.  You may be given one or more of the following: ? A medicine to help you relax (sedative). ? A medicine to numb the opening of the urethra (local anesthetic). ? A medicine to make you fall asleep (general anesthetic). ? A medicine that is injected into your spine to numb the area below and slightly above the injection site (spinal anesthetic). ? A medicine that is injected into an area of your body to numb everything below the injection site (regional anesthetic).  You will lie on your back with your knees bent and spread apart.  The cystoscope or ureteroscope will be inserted into your urethra and guided into your bladder or ureters.  Your bladder may be slowly filled with germ-free (sterile) water. This will make it easier for your health care provider to view the wall or lining of your bladder.  Small instruments will be inserted through the scope to collect a small tissue sample that will be examined under a microscope. The procedure may vary among health care providers and hospitals. What happens after the procedure?  You may be asked to empty your bladder, or your bladder may be emptied for you.  Your blood pressure, heart rate, breathing rate, and blood oxygen level will be monitored until the medicines you were given wear off.  Do not drive for 24 hours if you received a sedative. This information is not intended to replace advice given to you by your health care provider. Make sure you discuss any questions you have with your health care provider. Document Released: 12/08/2015 Document Revised: 04/28/2016 Document Reviewed: 12/08/2015 Elsevier Interactive  Patient Education  2018 [place].  Bladder Biopsy, Care After Refer to this sheet in the next few weeks. These instructions provide you with information about caring for yourself after your procedure. Your health care provider may also give you more specific instructions. Your treatment has been planned according to current medical practices, but problems sometimes occur. Call your health care provider if you have any problems or questions after your procedure. What can I expect after the procedure? After the procedure, it is common to have:  Mild pain in your bladder or kidney area during urination.  Minor burning during urination.  Small amounts of blood in your urine.  A sudden urge to urinate.  A need to urinate more often than usual.  Follow these instructions at home: Medicines  Take over-the-counter and prescription medicines only as told by your health care provider.  If you were prescribed an antibiotic medicine, take it as  told by your health care provider. Do not stop taking the antibiotic even if you start to feel better. General instructions   Take a warm bath to relieve any burning sensations around your urethra.  Hold a warm, damp washcloth over the urethral area to ease pain.  Return to your normal activities as told by your health care provider. Ask your health care provider what activities are safe for you.  Do not drive for 24 hours if you received a medicine to help you relax (sedative) during your procedure. Ask your health care provider when it is safe for you to drive.  It is your responsibility to get the results of your procedure. Ask your health care provider or the department performing the procedure when your results will be ready.  Keep all follow-up visits as told by your health care provider. This is important. Contact a health care provider if:  You have a fever.  Your symptoms do not improve within 24 hours and you continue to  have: ? Burning during urination. ? Increasing amounts of blood in your urine. ? Pain during urination. ? An urgent need to urinate. ? A need to urinate more often than usual. Get help right away if:  You have a lot of bleeding or more bleeding.  You have severe pain.  You are unable to urinate.  You have bright red blood in your urine.  You are passing blood clots in your urine.  You have a fever.  You have swelling, redness, or pain in your legs.  You have difficulty breathing. This information is not intended to replace advice given to you by your health care provider. Make sure you discuss any questions you have with your health care provider. Document Released: 12/08/2015 Document Revised: 04/28/2016 Document Reviewed: 12/08/2015 Elsevier Interactive Patient Education  [employer].  Cystoscopy Cystoscopy is a procedure that is used to help diagnose and sometimes treat conditions that affect that lower urinary tract. The lower urinary tract includes the bladder and the tube that drains urine from the bladder out of the body (urethra). Cystoscopy is performed with a thin, tube-shaped instrument with a light and camera at the end (cystoscope). The cystoscope may be hard (rigid) or flexible, depending on the goal of the procedure.The cystoscope is inserted through the urethra, into the bladder. Cystoscopy may be recommended if you have:  Urinary tractinfections that keep coming back (recurring).  Blood in the urine (hematuria).  Loss of bladder control (urinary incontinence) or an overactive bladder.  Unusual cells found in a urine sample.  A blockage in the urethra.  Painful urination.  An abnormality in the bladder found during an intravenous pyelogram (IVP) or CT scan.  Cystoscopy may also be done to remove a sample of tissue to be examined under a microscope (biopsy). Tell a health care provider about:  Any allergies you have.  All medicines you are  taking, including vitamins, herbs, eye drops, creams, and over-the-counter medicines.  Any problems you or family members have had with anesthetic medicines.  Any blood disorders you have.  Any surgeries you have had.  Any medical conditions you have.  Whether you are pregnant or may be pregnant. What are the risks? Generally, this is a safe procedure. However, problems may occur, including:  Infection.  Bleeding.  Allergic reactions to medicines.  Damage to other structures or organs.  What happens before the procedure?  Ask your health care provider about: ? Changing or stopping your regular medicines.  This is especially important if you are taking diabetes medicines or blood thinners. ? Taking medicines such as aspirin and ibuprofen. These medicines can thin your blood. Do not take these medicines before your procedure if your health care provider instructs you not to.  Follow instructions from your health care provider about eating or drinking restrictions.  You may be given antibiotic medicine to help prevent infection.  You may have an exam or testing, such as X-rays of the bladder, urethra, or kidneys.  You may have urine tests to check for signs of infection.  Plan to have someone take you home after the procedure. What happens during the procedure?  To reduce your risk of infection,your health care team will wash or sanitize their hands.  You will be given one or more of the following: ? A medicine to help you relax (sedative). ? A medicine to numb the area (local anesthetic).  The area around the opening of your urethra will be cleaned.  The cystoscope will be passed through your urethra into your bladder.  Germ-free (sterile)fluid will flow through the cystoscope to fill your bladder. The fluid will stretch your bladder so that your surgeon can clearly examine your bladder walls.  The cystoscope will be removed and your bladder will be emptied. The  procedure may vary among health care providers and hospitals. What happens after the procedure?  You may have some soreness or pain in your abdomen and urethra. Medicines will be available to help you.  You may have some blood in your urine.  Do not drive for 24 hours if you received a sedative. This information is not intended to replace advice given to you by your health care provider. Make sure you discuss any questions you have with your health care provider. Document Released: 11/18/2000 Document Revised: 03/31/2016 Document Reviewed: 10/08/2015 Elsevier Interactive Patient Education  2018 [employer].  Cystoscopy, Care After Refer to this sheet in the next few weeks. These instructions provide you with information about caring for yourself after your procedure. Your health care provider may also give you more specific instructions. Your treatment has been planned according to current medical practices, but problems sometimes occur. Call your health care provider if you have any problems or questions after your procedure. What can I expect after the procedure? After the procedure, it is common to have:  Mild pain when you urinate. Pain should stop within a few minutes after you urinate. This may last for up to 1 week.  A small amount of blood in your urine for several days.  Feeling like you need to urinate but producing only a small amount of urine.  Follow these instructions at home:  Medicines  Take over-the-counter and prescription medicines only as told by your health care provider.  If you were prescribed an antibiotic medicine, take it as told by your health care provider. Do not stop taking the antibiotic even if you start to feel better. General instructions   Return to your normal activities as told by your health care provider. Ask your health care provider what activities are safe for you.  Do not drive for 24 hours if you received a sedative.  Watch for any  blood in your urine. If the amount of blood in your urine increases, call your health care provider.  Follow instructions from your health care provider about eating or drinking restrictions.  If a tissue sample was removed for testing (biopsy) during your procedure, it is your responsibility  to get your test results. Ask your health care provider or the department performing the test when your results will be ready.  Drink enough fluid to keep your urine clear or pale yellow.  Keep all follow-up visits as told by your health care provider. This is important. Contact a health care provider if:  You have pain that gets worse or does not get better with medicine, especially pain when you urinate.  You have difficulty urinating. Get help right away if:  You have more blood in your urine.  You have blood clots in your urine.  You have abdominal pain.  You have a fever or chills.  You are unable to urinate. This information is not intended to replace advice given to you by your health care provider. Make sure you discuss any questions you have with your health care provider. Document Released: 06/10/2005 Document Revised: 04/28/2016 Document Reviewed: 10/08/2015 Elsevier Interactive Patient Education  2018 [place] Anesthesia, Adult General anesthesia is the use of medicines to make a person "go to sleep" (be unconscious) for a medical procedure. General anesthesia is often recommended when a procedure:  Is long.  Requires you to be still or in an unusual position.  Is major and can cause you to lose blood.  Is impossible to do without general anesthesia.  The medicines used for general anesthesia are called general anesthetics. In addition to making you sleep, the medicines:  Prevent pain.  Control your blood pressure.  Relax your muscles.  Tell a health care provider about:  Any allergies you have.  All medicines you are taking, including vitamins,  herbs, eye drops, creams, and over-the-counter medicines.  Any problems you or family members have had with anesthetic medicines.  Types of anesthetics you have had in the past.  Any bleeding disorders you have.  Any surgeries you have had.  Any medical conditions you have.  Any history of heart or lung conditions, such as heart failure, sleep apnea, or chronic obstructive pulmonary disease (COPD).  Whether you are pregnant or may be pregnant.  Whether you use tobacco, alcohol, marijuana, or street drugs.  Any history of Armed forces logistics/support/administrative officer.  Any history of depression or anxiety. What are the risks? Generally, this is a safe procedure. However, problems may occur, including:  Allergic reaction to anesthetics.  Lung and heart problems.  Inhaling food or liquids from your stomach into your lungs (aspiration).  Injury to nerves.  Waking up during your procedure and being unable to move (rare).  Extreme agitation or a state of mental confusion (delirium) when you wake up from the anesthetic.  Air in the bloodstream, which can lead to stroke.  These problems are more likely to develop if you are having a major surgery or if you have an advanced medical condition. You can prevent some of these complications by answering all of your health care provider's questions thoroughly and by following all pre-procedure instructions. General anesthesia can cause side effects, including:  Nausea or vomiting  A sore throat from the breathing tube.  Feeling cold or shivery.  Feeling tired, washed out, or achy.  Sleepiness or drowsiness.  Confusion or agitation.  What happens before the procedure? Staying hydrated Follow instructions from your health care provider about hydration, which may include:  Up to 2 hours before the procedure - you may continue to drink clear liquids, such as water, clear fruit juice, black coffee, and plain tea.  Eating and drinking restrictions Follow  instructions from  your health care provider about eating and drinking, which may include:  8 hours before the procedure - stop eating heavy meals or foods such as meat, fried foods, or fatty foods.  6 hours before the procedure - stop eating light meals or foods, such as toast or cereal.  6 hours before the procedure - stop drinking milk or drinks that contain milk.  2 hours before the procedure - stop drinking clear liquids.  Medicines  Ask your health care provider about: ? Changing or stopping your regular medicines. This is especially important if you are taking diabetes medicines or blood thinners. ? Taking medicines such as aspirin and ibuprofen. These medicines can thin your blood. Do not take these medicines before your procedure if your health care provider instructs you not to. ? Taking new dietary supplements or medicines. Do not take these during the week before your procedure unless your health care provider approves them.  If you are told to take a medicine or to continue taking a medicine on the day of the procedure, take the medicine with sips of water. General instructions   Ask if you will be going home the same day, the following day, or after a longer hospital stay. ? Plan to have someone take you home. ? Plan to have someone stay with you for the first 24 hours after you leave the hospital or clinic.  For 3-6 weeks before the procedure, try not to use any tobacco products, such as cigarettes, chewing tobacco, and e-cigarettes.  You may brush your teeth on the morning of the procedure, but make sure to spit out the toothpaste. What happens during the procedure?  You will be given anesthetics through a mask and through an IV tube in one of your veins.  You may receive medicine to help you relax (sedative).  As soon as you are asleep, a breathing tube may be used to help you breathe.  An anesthesia specialist will stay with you throughout the procedure. He or she  will help keep you comfortable and safe by continuing to give you medicines and adjusting the amount of medicine that you get. He or she will also watch your blood pressure, pulse, and oxygen levels to make sure that the anesthetics do not cause any problems.  If a breathing tube was used to help you breathe, it will be removed before you wake up. The procedure may vary among health care providers and hospitals. What happens after the procedure?  You will wake up, often slowly, after the procedure is complete, usually in a recovery area.  Your blood pressure, heart rate, breathing rate, and blood oxygen level will be monitored until the medicines you were given have worn off.  You may be given medicine to help you calm down if you feel anxious or agitated.  If you will be going home the same day, your health care provider may check to make sure you can stand, drink, and urinate.  Your health care providers will treat your pain and side effects before you go home.  Do not drive for 24 hours if you received a sedative.  You may: ? Feel nauseous and vomit. ? Have a sore throat. ? Have mental slowness. ? Feel cold or shivery. ? Feel sleepy. ? Feel tired. ? Feel sore or achy, even in parts of your body where you did not have surgery. This information is not intended to replace advice given to you by your health care provider. Make sure  you discuss any questions you have with your health care provider. Document Released: 02/28/2008 Document Revised: 05/03/2016 Document Reviewed: 11/05/2015 Elsevier Interactive Patient Education  2018 [place] Anesthesia, Adult, Care After These instructions provide you with information about caring for yourself after your procedure. Your health care provider may also give you more specific instructions. Your treatment has been planned according to current medical practices, but problems sometimes occur. Call your health care provider if you have  any problems or questions after your procedure. What can I expect after the procedure? After the procedure, it is common to have:  Vomiting.  A sore throat.  Mental slowness.  It is common to feel:  Nauseous.  Cold or shivery.  Sleepy.  Tired.  Sore or achy, even in parts of your body where you did not have surgery.  Follow these instructions at home: For at least 24 hours after the procedure:  Do not: ? Participate in activities where you could fall or become injured. ? Drive. ? Use heavy machinery. ? Drink alcohol. ? Take sleeping pills or medicines that cause drowsiness. ? Make important decisions or sign legal documents. ? Take care of children on your own.  Rest. Eating and drinking  If you vomit, drink water, juice, or soup when you can drink without vomiting.  Drink enough fluid to keep your urine clear or pale yellow.  Make sure you have little or no nausea before eating solid foods.  Follow the diet recommended by your health care provider. General instructions  Have a responsible adult stay with you until you are awake and alert.  Return to your normal activities as told by your health care provider. Ask your health care provider what activities are safe for you.  Take over-the-counter and prescription medicines only as told by your health care provider.  If you smoke, do not smoke without supervision.  Keep all follow-up visits as told by your health care provider. This is important. Contact a health care provider if:  You continue to have nausea or vomiting at home, and medicines are not helpful.  You cannot drink fluids or start eating again.  You cannot urinate after 8-12 hours.  You develop a skin rash.  You have fever.  You have increasing redness at the site of your procedure. Get help right away if:  You have difficulty breathing.  You have chest pain.  You have unexpected bleeding.  You feel that you are having a  life-threatening or urgent problem. This information is not intended to replace advice given to you by your health care provider. Make sure you discuss any questions you have with your health care provider. Document Released: 02/27/2001 Document Revised: 04/25/2016 Document Reviewed: 11/05/2015 Elsevier Interactive Patient Education  [employer].

## 2018-07-30 ENCOUNTER — Encounter (HOSPITAL_COMMUNITY)
Admission: RE | Admit: 2018-07-30 | Discharge: 2018-07-30 | Disposition: A | Discharge status: Home / Self Care | Payer: Medicare HMO | Source: Ambulatory Visit | Attending: Urology | Admitting: Urology

## 2018-07-30 ENCOUNTER — Encounter (HOSPITAL_COMMUNITY): Payer: Self-pay

## 2018-07-30 ENCOUNTER — Other Ambulatory Visit: Payer: Self-pay

## 2018-07-30 DIAGNOSIS — I10 Essential (primary) hypertension: Secondary | ICD-10-CM | POA: Diagnosis not present

## 2018-07-30 DIAGNOSIS — Z87891 Personal history of nicotine dependence: Secondary | ICD-10-CM | POA: Diagnosis not present

## 2018-07-30 DIAGNOSIS — Z955 Presence of coronary angioplasty implant and graft: Secondary | ICD-10-CM | POA: Diagnosis not present

## 2018-07-30 DIAGNOSIS — N3090 Cystitis, unspecified without hematuria: Secondary | ICD-10-CM | POA: Diagnosis not present

## 2018-07-30 DIAGNOSIS — I2510 Atherosclerotic heart disease of native coronary artery without angina pectoris: Secondary | ICD-10-CM | POA: Diagnosis not present

## 2018-07-30 DIAGNOSIS — E119 Type 2 diabetes mellitus without complications: Secondary | ICD-10-CM | POA: Diagnosis not present

## 2018-07-30 DIAGNOSIS — E785 Hyperlipidemia, unspecified: Secondary | ICD-10-CM | POA: Diagnosis not present

## 2018-07-30 DIAGNOSIS — I252 Old myocardial infarction: Secondary | ICD-10-CM | POA: Diagnosis not present

## 2018-07-30 DIAGNOSIS — Z7984 Long term (current) use of oral hypoglycemic drugs: Secondary | ICD-10-CM | POA: Diagnosis not present

## 2018-07-30 DIAGNOSIS — Z79899 Other long term (current) drug therapy: Secondary | ICD-10-CM | POA: Diagnosis not present

## 2018-07-30 DIAGNOSIS — Z8551 Personal history of malignant neoplasm of bladder: Secondary | ICD-10-CM | POA: Diagnosis not present

## 2018-07-30 LAB — BASIC METABOLIC PANEL
ANION GAP: 10 (ref 5–15)
BUN: 17 mg/dL (ref 8–23)
CALCIUM: 9.2 mg/dL (ref 8.9–10.3)
CO2: 26 mmol/L (ref 22–32)
Chloride: 103 mmol/L (ref 98–111)
Creatinine, Ser: 1.54 mg/dL — ABNORMAL HIGH (ref 0.61–1.24)
GFR, EST AFRICAN AMERICAN: 51 mL/min — AB (ref >60)
GFR, EST NON AFRICAN AMERICAN: 44 mL/min — AB (ref >60)
Glucose, Bld: 235 mg/dL — ABNORMAL HIGH (ref 70–99)
Potassium: 4.4 mmol/L (ref 3.5–5.1)
SODIUM: 139 mmol/L (ref 135–145)

## 2018-07-30 LAB — HEMOGLOBIN A1C
HEMOGLOBIN A1C: 7.2 % — AB (ref 4.8–5.6)
Mean Plasma Glucose: 159.94 mg/dL

## 2018-07-30 LAB — CBC WITH DIFFERENTIAL/PLATELET
BASOS ABS: 0 10*3/uL (ref 0.0–0.1)
BASOS PCT: 0 %
EOS ABS: 0.2 10*3/uL (ref 0.0–0.7)
Eosinophils Relative: 3 %
HCT: 44.8 % (ref 39.0–52.0)
Hemoglobin: 15.5 g/dL (ref 13.0–17.0)
Lymphocytes Relative: 29 %
Lymphs Abs: 1.9 10*3/uL (ref 0.7–4.0)
MCH: 32.3 pg (ref 26.0–34.0)
MCHC: 34.6 g/dL (ref 30.0–36.0)
MCV: 93.3 fL (ref 78.0–100.0)
Monocytes Absolute: 0.4 10*3/uL (ref 0.1–1.0)
Monocytes Relative: 6 %
NEUTROS ABS: 3.9 10*3/uL (ref 1.7–7.7)
Neutrophils Relative %: 62 %
PLATELETS: 175 10*3/uL (ref 150–400)
RBC: 4.8 MIL/uL (ref 4.22–5.81)
RDW: 13.5 % (ref 11.5–15.5)
WBC: 6.3 10*3/uL (ref 4.0–10.5)

## 2018-07-30 LAB — GLUCOSE, CAPILLARY: GLUCOSE-CAPILLARY: 225 mg/dL — AB (ref 70–99)

## 2018-07-31 ENCOUNTER — Ambulatory Visit (HOSPITAL_COMMUNITY): Payer: Medicare HMO

## 2018-07-31 ENCOUNTER — Ambulatory Visit (HOSPITAL_COMMUNITY): Payer: Medicare HMO | Admitting: Anesthesiology

## 2018-07-31 ENCOUNTER — Encounter (HOSPITAL_COMMUNITY)
Admission: RE | Disposition: A | Discharge status: Home / Self Care | Payer: Self-pay | Source: Ambulatory Visit | Attending: Urology

## 2018-07-31 ENCOUNTER — Ambulatory Visit (HOSPITAL_COMMUNITY)
Admission: RE | Admit: 2018-07-31 | Discharge: 2018-07-31 | Disposition: A | Discharge status: Home / Self Care | Payer: Medicare HMO | Source: Ambulatory Visit | Attending: Urology | Admitting: Urology

## 2018-07-31 DIAGNOSIS — Z7984 Long term (current) use of oral hypoglycemic drugs: Secondary | ICD-10-CM | POA: Insufficient documentation

## 2018-07-31 DIAGNOSIS — I2510 Atherosclerotic heart disease of native coronary artery without angina pectoris: Secondary | ICD-10-CM | POA: Insufficient documentation

## 2018-07-31 DIAGNOSIS — D494 Neoplasm of unspecified behavior of bladder: Secondary | ICD-10-CM | POA: Diagnosis not present

## 2018-07-31 DIAGNOSIS — Z87891 Personal history of nicotine dependence: Secondary | ICD-10-CM | POA: Insufficient documentation

## 2018-07-31 DIAGNOSIS — E785 Hyperlipidemia, unspecified: Secondary | ICD-10-CM | POA: Diagnosis not present

## 2018-07-31 DIAGNOSIS — N3090 Cystitis, unspecified without hematuria: Secondary | ICD-10-CM | POA: Insufficient documentation

## 2018-07-31 DIAGNOSIS — Z955 Presence of coronary angioplasty implant and graft: Secondary | ICD-10-CM | POA: Insufficient documentation

## 2018-07-31 DIAGNOSIS — R896 Abnormal cytological findings in specimens from other organs, systems and tissues: Secondary | ICD-10-CM | POA: Diagnosis not present

## 2018-07-31 DIAGNOSIS — I252 Old myocardial infarction: Secondary | ICD-10-CM | POA: Insufficient documentation

## 2018-07-31 DIAGNOSIS — Z79899 Other long term (current) drug therapy: Secondary | ICD-10-CM | POA: Insufficient documentation

## 2018-07-31 DIAGNOSIS — Z8551 Personal history of malignant neoplasm of bladder: Secondary | ICD-10-CM | POA: Diagnosis not present

## 2018-07-31 DIAGNOSIS — I10 Essential (primary) hypertension: Secondary | ICD-10-CM | POA: Insufficient documentation

## 2018-07-31 DIAGNOSIS — C679 Malignant neoplasm of bladder, unspecified: Secondary | ICD-10-CM

## 2018-07-31 DIAGNOSIS — E119 Type 2 diabetes mellitus without complications: Secondary | ICD-10-CM | POA: Diagnosis not present

## 2018-07-31 HISTORY — PX: CYSTOSCOPY WITH BIOPSY: SHX5122

## 2018-07-31 HISTORY — PX: CYSTOSCOPY W/ RETROGRADES: SHX1426

## 2018-07-31 LAB — GLUCOSE, CAPILLARY
GLUCOSE-CAPILLARY: 170 mg/dL — AB (ref 70–99)
Glucose-Capillary: 147 mg/dL — ABNORMAL HIGH (ref 70–99)

## 2018-07-31 SURGERY — CYSTOSCOPY, WITH BIOPSY
Anesthesia: General

## 2018-07-31 MED ORDER — FENTANYL CITRATE (PF) 100 MCG/2ML IJ SOLN
INTRAMUSCULAR | Status: AC
  Filled 2018-07-31: qty 2

## 2018-07-31 MED ORDER — CEPHALEXIN 500 MG PO CAPS: 500.0000 mg | ORAL_CAPSULE | Freq: Two times a day (BID) | ORAL | 0 refills | Status: DC

## 2018-07-31 MED ORDER — GLYCOPYRROLATE PF 0.2 MG/ML IJ SOSY
PREFILLED_SYRINGE | INTRAMUSCULAR | Status: DC | PRN
  Administered 2018-07-31: .2 mg via INTRAVENOUS

## 2018-07-31 MED ORDER — PROPOFOL 10 MG/ML IV BOLUS
INTRAVENOUS | Status: DC | PRN
  Administered 2018-07-31: 150 mg via INTRAVENOUS
  Administered 2018-07-31 (×2): 50 mg via INTRAVENOUS

## 2018-07-31 MED ORDER — LIDOCAINE HCL (PF) 1 % IJ SOLN
INTRAMUSCULAR | Status: AC
  Filled 2018-07-31: qty 10

## 2018-07-31 MED ORDER — SUCCINYLCHOLINE CHLORIDE 20 MG/ML IJ SOLN
INTRAMUSCULAR | Status: AC
  Filled 2018-07-31: qty 1

## 2018-07-31 MED ORDER — GLYCOPYRROLATE 0.2 MG/ML IJ SOLN
INTRAMUSCULAR | Status: AC
  Filled 2018-07-31: qty 1

## 2018-07-31 MED ORDER — PHENYLEPHRINE 40 MCG/ML (10ML) SYRINGE FOR IV PUSH (FOR BLOOD PRESSURE SUPPORT)
PREFILLED_SYRINGE | INTRAVENOUS | Status: AC
  Filled 2018-07-31: qty 10

## 2018-07-31 MED ORDER — PROPOFOL 10 MG/ML IV BOLUS
INTRAVENOUS | Status: AC
  Filled 2018-07-31: qty 40

## 2018-07-31 MED ORDER — DIATRIZOATE MEGLUMINE 30 % UR SOLN
URETHRAL | Status: DC | PRN
  Administered 2018-07-31: 25 mL

## 2018-07-31 MED ORDER — WATER FOR IRRIGATION, STERILE IR SOLN
Status: DC | PRN
  Administered 2018-07-31: 1000 mL via INTRAVESICAL

## 2018-07-31 MED ORDER — STERILE WATER FOR IRRIGATION IR SOLN
Status: DC | PRN
  Administered 2018-07-31: 3000 mL

## 2018-07-31 MED ORDER — LIDOCAINE HCL (PF) 1 % IJ SOLN
INTRAMUSCULAR | Status: AC
  Filled 2018-07-31: qty 5

## 2018-07-31 MED ORDER — FENTANYL CITRATE (PF) 100 MCG/2ML IJ SOLN: 25.0000 ug | INTRAMUSCULAR | Status: DC | PRN

## 2018-07-31 MED ORDER — CEFAZOLIN SODIUM-DEXTROSE 2-4 GM/100ML-% IV SOLN
INTRAVENOUS | Status: AC
  Filled 2018-07-31: qty 100

## 2018-07-31 MED ORDER — FENTANYL CITRATE (PF) 100 MCG/2ML IJ SOLN
INTRAMUSCULAR | Status: DC | PRN
  Administered 2018-07-31 (×2): 25 ug via INTRAVENOUS
  Administered 2018-07-31: 50 ug via INTRAVENOUS

## 2018-07-31 MED ORDER — LACTATED RINGERS IV SOLN
INTRAVENOUS | Status: DC
  Administered 2018-07-31: 08:00:00 via INTRAVENOUS

## 2018-07-31 MED ORDER — ONDANSETRON HCL 4 MG/2ML IJ SOLN
INTRAMUSCULAR | Status: AC
  Filled 2018-07-31: qty 2

## 2018-07-31 MED ORDER — HYDROCODONE-ACETAMINOPHEN 7.5-325 MG PO TABS
1.0000 | ORAL_TABLET | Freq: Once | ORAL | Status: AC | PRN
  Administered 2018-07-31: 1 via ORAL
  Filled 2018-07-31: qty 1

## 2018-07-31 MED ORDER — CEFAZOLIN SODIUM-DEXTROSE 2-4 GM/100ML-% IV SOLN
2.0000 g | INTRAVENOUS | Status: AC
  Administered 2018-07-31: 2 g via INTRAVENOUS

## 2018-07-31 MED ORDER — DIATRIZOATE MEGLUMINE 30 % UR SOLN
URETHRAL | Status: AC
  Filled 2018-07-31: qty 100

## 2018-07-31 SURGICAL SUPPLY — 29 items
BAG DRAIN URO TABLE W/ADPT NS (BAG) ×3 IMPLANT
BAG DRN 8 ADPR NS SKTRN CSTL (BAG) ×2
BAG URINE LEG 500ML (DRAIN) ×1 IMPLANT
CATH FOLEY 2WAY SLVR  5CC 18FR (CATHETERS) ×1
CATH FOLEY 2WAY SLVR 5CC 18FR (CATHETERS) IMPLANT
CATH INTERMIT  6FR 70CM (CATHETERS) ×3 IMPLANT
CLOTH BEACON ORANGE TIMEOUT ST (SAFETY) ×3 IMPLANT
CONT SPEC 4OZ CLIKSEAL STRL BL (MISCELLANEOUS) ×2 IMPLANT
DECANTER SPIKE VIAL GLASS SM (MISCELLANEOUS) ×3 IMPLANT
ELECT REM PT RETURN 9FT ADLT (ELECTROSURGICAL) ×3
ELECTRODE REM PT RTRN 9FT ADLT (ELECTROSURGICAL) ×2 IMPLANT
GLOVE BIO SURGEON STRL SZ8 (GLOVE) ×3 IMPLANT
GLOVE BIOGEL M 8.0 STRL (GLOVE) ×3 IMPLANT
GOWN STRL REUS W/TWL LRG LVL3 (GOWN DISPOSABLE) ×3 IMPLANT
GOWN STRL REUS W/TWL XL LVL3 (GOWN DISPOSABLE) ×3 IMPLANT
GUIDEWIRE ANG ZIPWIRE 038X150 (WIRE) ×1 IMPLANT
GUIDEWIRE STR DUAL SENSOR (WIRE) ×3 IMPLANT
GUIDEWIRE STR ZIPWIRE 035X150 (MISCELLANEOUS) ×1 IMPLANT
KIT TURNOVER CYSTO (KITS) ×3 IMPLANT
MANIFOLD NEPTUNE II (INSTRUMENTS) ×3 IMPLANT
NDL HYPO 18GX1.5 BLUNT FILL (NEEDLE) ×2 IMPLANT
NEEDLE HYPO 18GX1.5 BLUNT FILL (NEEDLE) ×3 IMPLANT
PACK CYSTO (CUSTOM PROCEDURE TRAY) ×3 IMPLANT
PAD ARMBOARD 7.5X6 YLW CONV (MISCELLANEOUS) ×3 IMPLANT
PAD TELFA 3X4 1S STER (GAUZE/BANDAGES/DRESSINGS) ×3 IMPLANT
SYR CONTROL 10ML LL (SYRINGE) ×2 IMPLANT
TOWEL OR 17X26 4PK STRL BLUE (TOWEL DISPOSABLE) ×3 IMPLANT
WATER STERILE IRR 1000ML POUR (IV SOLUTION) ×3 IMPLANT
WATER STERILE IRR 3000ML UROMA (IV SOLUTION) ×3 IMPLANT

## 2018-07-31 NOTE — Interval H&P Note (Signed)
History and Physical Interval Note:  07/31/2018 7:23 AM  Timothy [name]  has presented today for surgery, with the diagnosis of BLADDER CANCER  The various methods of treatment have been discussed with the patient and family. After consideration of risks, benefits and other options for treatment, the patient has consented to  Procedure(s) with comments: CYSTOSCOPY WITH BIOPSY (N/A) - [place] [id number] CYSTOSCOPY WITH BILATERAL RETROGRADE PYELOGRAM (Bilateral) as a surgical intervention .  The patient's history has been reviewed, patient examined, no change in status, stable for surgery.  I have reviewed the patient's chart and labs.  Questions were answered to the patient's satisfaction.     [name] [name]

## 2018-07-31 NOTE — Discharge Instructions (Signed)
Indwelling Urinary Catheter Care, Adult Take good care of your catheter to keep it working and to prevent problems. How to wear your catheter Attach your catheter to your leg with tape (adhesive tape) or a leg strap. Make sure it is not too tight. If you use tape, remove any bits of tape that are already on the catheter. How to wear a drainage bag You should have: A large overnight bag. A small leg bag.  Overnight Bag You may wear the overnight bag at any time. Always keep the bag below the level of your bladder but off the floor. When you sleep, put a clean plastic bag in a wastebasket. Then hang the bag inside the wastebasket. Leg Bag Never wear the leg bag at night. Always wear the leg bag below your knee. Keep the leg bag secure with a leg strap or tape. How to care for your skin Clean the skin around the catheter at least once every day. Shower every day. Do not take baths. Put creams, lotions, or ointments on your genital area only as told by your doctor. Do not use powders, sprays, or lotions on your genital area. How to clean your catheter and your skin Wash your hands with soap and water. Wet a washcloth in warm water and gentle (mild) soap. Use the washcloth to clean the skin where the catheter enters your body. Clean downward and wipe away from the catheter in small circles. Do not wipe toward the catheter. Pat the area dry with a clean towel. Make sure to clean off all soap. How to care for your drainage bags Empty your drainage bag when it is ?- full or at least 2-3 times a day. Replace your drainage bag once a month or sooner if it starts to smell bad or look dirty. Do not clean your drainage bag unless told by your doctor. Emptying a drainage bag  Supplies Needed Rubbing alcohol. Gauze pad or cotton ball. Tape or a leg strap.  Steps Wash your hands with soap and water. Separate (detach) the bag from your leg. Hold the bag over the toilet or a clean container. Keep  the bag below your hips and bladder. This stops pee (urine) from going back into the tube. Open the pour spout at the bottom of the bag. Empty the pee into the toilet or container. Do not let the pour spout touch any surface. Put rubbing alcohol on a gauze pad or cotton ball. Use the gauze pad or cotton ball to clean the pour spout. Close the pour spout. Attach the bag to your leg with tape or a leg strap. Wash your hands.  Changing a drainage bag Supplies Needed Alcohol wipes. A clean drainage bag. Adhesive tape or a leg strap.  Steps Wash your hands with soap and water. Separate the dirty bag from your leg. Pinch the rubber catheter with your fingers so that pee does not spill out. Separate the catheter tube from the drainage tube where these tubes connect (at the connection valve). Do not let the tubes touch any surface. Clean the end of the catheter tube with an alcohol wipe. Use a different alcohol wipe to clean the end of the drainage tube. Connect the catheter tube to the drainage tube of the clean bag. Attach the new bag to the leg with adhesive tape or a leg strap. Wash your hands.  How to prevent infection and other problems Never pull on your catheter or try to remove it. Pulling can damage  tissue in your body. Always wash your hands before and after touching your catheter. If a leg strap gets wet, replace it with a dry one. Drink enough fluids to keep your pee clear or pale yellow, or as told by your doctor. Do not let the drainage bag or tubing touch the floor. Wear cotton underwear. If you are male, wipe from front to back after you poop (have a bowel movement). Check on the catheter often to make sure it works and the tubing is not twisted. Get help if: Your pee is cloudy. Your pee smells unusually bad. Your pee is not draining into the bag. Your tube gets clogged. Your catheter starts to leak. Your bladder feels full. Get help right away if: You have  redness, swelling, or pain where the catheter enters your body. You have fluid, pus, or a bad smell coming from the area where the catheter enters your body. The area where the catheter enters your body feels warm. You have a fever. You have pain in your: Stomach (abdomen). Legs. Lower back. Bladder. You see blood fill the catheter. Your pee is pink or red. You feel sick to your stomach (nauseous). You throw up (vomit). You have chills. Your catheter gets pulled out. This information is not intended to replace advice given to you by your health care provider. Make sure you discuss any questions you have with your health care provider. Document Released: 03/18/2013 Document Revised: 10/19/2016 Document Reviewed: 05/06/2014 Elsevier Interactive Patient Education  2018 [place] may see some blood in the urine and may have some burning with urination for 48-72 hours. You also may notice that you have to urinate more frequently or urgently after your procedure which is normal.  2. You should call should you develop an inability urinate, fever > 101, persistent nausea and vomiting that prevents you from eating or drinking to stay hydrated.  3. If you have a catheter, you will be taught how to take care of the catheter by the nursing staff prior to discharge from the hospital.  You may periodically feel a strong urge to void with the catheter in place.  This is a bladder spasm and most often can occur when having a bowel movement or moving around. It is typically self-limited and usually will stop after a few minutes.  You may use some Vaseline or Neosporin around the tip of the catheter to reduce friction at the tip of the penis. You may also see some blood in the urine.  A very small amount of blood can make the urine look quite red.  As long as the catheter is draining well, there usually is not a problem.  However, if the catheter is not draining well and is bloody, you should call the  office [phone]) to notify us. It is OK to remove the catheter Wednesday morning

## 2018-07-31 NOTE — Anesthesia Procedure Notes (Signed)
Procedure Name: LMA Insertion Date/Time: 07/31/2018 7:44 AM Performed by: Andree Elk, Lulu Hirschmann A, CRNA Pre-anesthesia Checklist: Patient identified, Timeout performed, Emergency Drugs available, Suction available and Patient being monitored Patient Re-evaluated:Patient Re-evaluated prior to induction Oxygen Delivery Method: Circle system utilized Preoxygenation: Pre-oxygenation with 100% oxygen Induction Type: IV induction Ventilation: Mask ventilation without difficulty LMA: LMA inserted LMA Size: 5.0 Number of attempts: 1 Placement Confirmation: positive ETCO2 and breath sounds checked- equal and bilateral Tube secured with: Tape Dental Injury: Teeth and Oropharynx as per pre-operative assessment

## 2018-07-31 NOTE — H&P (Signed)
H&P  Chief Complaint: History of bladder cancer  History of Present Illness: 72 year old male presents for cysto, bilateral RGPs and bladder biopsy. He has a h/o bladder cancer and has had previous TURBT as well as BCG Rx. Recent cysto/cytology 7.9.2019 revealed erythematous bladder lesions/atypical cells.   Past Medical History:  Diagnosis Date  . Arthritis   . Bladder cancer (Timothy [name])   . CAD (coronary artery disease)    a. 06/2014: NSTEMI s/p PTCA of LCx, 60-80% residual in distal LAD, 70% prox small D1.   b. 02/06/15 NSTEMI  s/p successful PCI/DES to mid RCA  . Diabetes mellitus, type 2 ([place])   . Elevated PSA   . HLD (hyperlipidemia)   . HOH (hard of hearing)   . Hypertension   . NSTEMI (non-ST elevated myocardial infarction) [facility])     Past Surgical History:  Procedure Laterality Date  . CARDIAC CATHETERIZATION  02/06/2015   Procedure: CORONARY STENT INTERVENTION;  Surgeon: [name], MD;  Location: [facility] CATH LAB;  Service: Cardiovascular;;  Mid RCA  . CARDIAC CATHETERIZATION N/A 11/11/2016   Procedure: Left Heart Cath and Coronary Angiography;  Surgeon: [name], MD;  Location: [place] CV LAB;  Service: Cardiovascular;  Laterality: N/A;  . CARDIAC CATHETERIZATION N/A 11/11/2016   Procedure: Coronary Balloon Angioplasty;  Surgeon: [name], MD;  Location: [place] CV LAB;  Service: Cardiovascular;  Laterality: N/A;  . CORONARY ANGIOPLASTY  11/11/2016   Mid Cx to Dist Cx lesion, 100 %stenosed. This lesion was treated with balloon angioplasty with a 2.0 balloon  . CYSTOSCOPY W/ RETROGRADES N/A 05/05/2015   Procedure: CYSTOSCOPY WITH BILATERAL RETROGRADE PYELOGRAMS AND BLADDER BIOPSIES;  Surgeon: [name], MD;  Location: [facility] ORS;  Service: Urology;  Laterality: N/A;  . LEFT HEART CATHETERIZATION WITH CORONARY ANGIOGRAM N/A 06/17/2014   Procedure: LEFT HEART CATHETERIZATION WITH CORONARY ANGIOGRAM;  Surgeon: [name], MD;  Location: [facility] CATH LAB;   Service: Cardiovascular;  Laterality: N/A;  . LEFT HEART CATHETERIZATION WITH CORONARY ANGIOGRAM N/A 02/06/2015   Procedure: LEFT HEART CATHETERIZATION WITH CORONARY ANGIOGRAM;  Surgeon: [name], MD;  Location: [facility] CATH LAB;  Service: Cardiovascular;  Laterality: N/A;  . PROSTATE BIOPSY N/A 03/31/2014   Procedure: BIOPSY TRANSRECTAL ULTRASONIC PROSTATE (TUBP);  Surgeon: [name], MD;  Location: [facility];  Service: Urology;  Laterality: N/A;  . TRANSURETHRAL RESECTION OF BLADDER TUMOR N/A 03/14/2013   Procedure: TRANSURETHRAL RESECTION OF BLADDER TUMOR (TURBT);  Surgeon: [name], MD;  Location: [facility];  Service: Urology;  Laterality: N/A;  1 HR WITH MITOMYCIN INSTILLATION   . TRANSURETHRAL RESECTION OF BLADDER TUMOR N/A 09/30/2013   Procedure: TRANSURETHRAL RESECTION OF BLADDER TUMOR (TURBT);  Surgeon: [name], MD;  Location: [facility];  Service: Urology;  Laterality: N/A;  . TRANSURETHRAL RESECTION OF BLADDER TUMOR N/A 03/31/2014   Procedure: TRANSURETHRAL RESECTION OF BLADDER TUMOR (TURBT);  Surgeon: [name], MD;  Location: [facility];  Service: Urology;  Laterality: N/A;  . TRANSURETHRAL RESECTION OF BLADDER TUMOR N/A 05/31/2015   Procedure: Cystoscopy with clott evacuation and fulgeration of bleeders, retrograde pylegram;  Surgeon: [name], MD;  Location: [facility] ORS;  Service: Urology;  Laterality: N/A;    Home Medications:  Allergies as of 07/31/2018      Reactions   Lipitor [atorvastatin] Other (See Comments)   Myalgia   Other Other (See Comments)   All Cholesterol Meds - causes muscle and joint pain  Pravastatin Other (See Comments)   Myalgia      Medication List    Notice   Cannot display discharge medications because the patient has not yet been admitted.     Allergies:  Allergies  Allergen Reactions  . Lipitor [Atorvastatin] Other (See Comments)    Myalgia   . Other Other (See Comments)    All Cholesterol Meds - causes muscle and joint pain  . Pravastatin Other (See Comments)    Myalgia    Family History  Problem Relation Age of Onset  . Cancer Child   . Asthma Mother     Social History:  reports that he quit smoking about 25 years ago. His smoking use included cigarettes. He has a 30.00 pack-year smoking history. He has never used smokeless tobacco. He reports that he does not drink alcohol or use drugs.  ROS: A complete review of systems was performed.  All systems are negative except for pertinent findings as noted.  Physical Exam:  Vital signs in last 24 hours: Temp:  [98.4 F (36.9 C)] 98.4 F (36.9 C) (08/26 1420) Pulse Rate:  [73] 73 (08/26 1420) Resp:  [20] 20 (08/26 1414) BP: (175)/(89) 175/89 (08/26 1420) SpO2:  [100 %] 100 % (08/26 1420) Weight:  [94.8 kg] 94.8 kg (08/26 1420) Constitutional:  Alert and oriented, No acute distress Cardiovascular: Regular rate  Respiratory: Normal respiratory effort GI: Abdomen is soft, nontender, nondistended, no abdominal masses Genitourinary: No CVAT. Normal male phallus, testes are descended bilaterally and non-tender and without masses, scrotum is normal in appearance without lesions or masses, perineum is normal on inspection. Lymphatic: No lymphadenopathy Neurologic: Grossly intact, no focal deficits Psychiatric: Normal mood and affect  Laboratory Data:  Recent Labs    07/30/18 1413  WBC 6.3  HGB 15.5  HCT 44.8  PLT 175    Recent Labs    07/30/18 1413  NA 139  K 4.4  CL 103  GLUCOSE 235*  BUN 17  CALCIUM 9.2  CREATININE 1.54*     Results for orders placed or performed during the hospital encounter of 07/30/18 (from the past 24 hour(s))  CBC with Differential/Platelet     Status: None   Collection Time: 07/30/18  2:13 PM  Result Value Ref Range   WBC 6.3 4.0 - 10.5 K/uL   RBC 4.80 4.22 - 5.81 MIL/uL   Hemoglobin 15.5 13.0 - 17.0 g/dL   HCT 44.8 39.0 -  52.0 %   MCV 93.3 78.0 - 100.0 fL   MCH 32.3 26.0 - 34.0 pg   MCHC 34.6 30.0 - 36.0 g/dL   RDW 13.5 11.5 - 15.5 %   Platelets 175 150 - 400 K/uL   Neutrophils Relative % 62 %   Neutro Abs 3.9 1.7 - 7.7 K/uL   Lymphocytes Relative 29 %   Lymphs Abs 1.9 0.7 - 4.0 K/uL   Monocytes Relative 6 %   Monocytes Absolute 0.4 0.1 - 1.0 K/uL   Eosinophils Relative 3 %   Eosinophils Absolute 0.2 0.0 - 0.7 K/uL   Basophils Relative 0 %   Basophils Absolute 0.0 0.0 - 0.1 K/uL  Basic metabolic panel     Status: Abnormal   Collection Time: 07/30/18  2:13 PM  Result Value Ref Range   Sodium 139 135 - 145 mmol/L   Potassium 4.4 3.5 - 5.1 mmol/L   Chloride 103 98 - 111 mmol/L   CO2 26 22 - 32 mmol/L   Glucose, Bld 235 (H) 70 -  99 mg/dL   BUN 17 8 - 23 mg/dL   Creatinine, Ser 1.54 (H) 0.61 - 1.24 mg/dL   Calcium 9.2 8.9 - 10.3 mg/dL   GFR calc non Af Amer 44 (L) >60 mL/min   GFR calc Af Amer 51 (L) >60 mL/min   Anion gap 10 5 - 15  Hemoglobin A1c     Status: Abnormal   Collection Time: 07/30/18  2:13 PM  Result Value Ref Range   Hgb A1c MFr Bld 7.2 (H) 4.8 - 5.6 %   Mean Plasma Glucose 159.94 mg/dL  Glucose, capillary     Status: Abnormal   Collection Time: 07/30/18  2:20 PM  Result Value Ref Range   Glucose-Capillary 225 (H) 70 - 99 mg/dL   No results found for this or any previous visit (from the past 240 hour(s)).  Renal Function: Recent Labs    07/30/18 1413  CREATININE 1.54*   Estimated Creatinine Clearance: 49.6 mL/min (A) (by C-G formula based on SCr of 1.54 mg/dL (H)).  Radiologic Imaging: No results found.  Impression/Assessment:  H/O bladder cancer with recent atypical cytologies  Plan:  Cystoscopy,bilatera RGPs, bladder biopsy

## 2018-07-31 NOTE — Anesthesia Preprocedure Evaluation (Signed)
Anesthesia Evaluation    Airway Mallampati: II  TM Distance: >3 FB Neck ROM: Full    Dental no notable dental hx.    Pulmonary former smoker,    Pulmonary exam normal breath sounds clear to auscultation       Cardiovascular Exercise Tolerance: Good hypertension, Pt. on medications + CAD and + Past MI  Normal cardiovascular examI Rhythm:Regular Rate:Normal     Neuro/Psych    GI/Hepatic   Endo/Other  diabetes, Well Controlled, Type 2, Oral Hypoglycemic Agents  Renal/GU      Musculoskeletal  (+) Arthritis , Osteoarthritis,    Abdominal   Peds  Hematology   Anesthesia Other Findings   Reproductive/Obstetrics                             Anesthesia Physical Anesthesia Plan  ASA: III  Anesthesia Plan: General   Post-op Pain Management:    Induction: Intravenous  PONV Risk Score and Plan:   Airway Management Planned: LMA  Additional Equipment:   Intra-op Plan:   Post-operative Plan: Extubation in OR  Informed Consent: I have reviewed the patients History and Physical, chart, labs and discussed the procedure including the risks, benefits and alternatives for the proposed anesthesia with the patient or authorized representative who has indicated his/her understanding and acceptance.   Dental advisory given  Plan Discussed with: CRNA  Anesthesia Plan Comments: (LMA vs ETT)        Anesthesia Quick Evaluation

## 2018-07-31 NOTE — Transfer of Care (Signed)
Immediate Anesthesia Transfer of Care Note  Patient: Timothy [name]  Procedure(s) Performed: CYSTOSCOPY WITH BIOPSY (N/A ) CYSTOSCOPY WITH BILATERAL RETROGRADE PYELOGRAM (Bilateral )  Patient Location: PACU  Anesthesia Type:General  Level of Consciousness: awake, oriented and patient cooperative  Airway & Oxygen Therapy: Patient Spontanous Breathing  Post-op Assessment: Report given to RN and Post -op Vital signs reviewed and stable  Post vital signs: Reviewed and stable  Last Vitals:  Vitals Value Taken Time  BP 148/92 07/31/2018  8:37 AM  Temp    Pulse 83 07/31/2018  8:40 AM  Resp 12 07/31/2018  8:40 AM  SpO2 93 % 07/31/2018  8:40 AM  Vitals shown include unvalidated device data.  Last Pain:  Vitals:   07/31/18 0703  TempSrc: Oral  PainSc: 0-No pain      Patients Stated Pain Goal: 5 (18/55/01 5868)  Complications: No apparent anesthesia complications

## 2018-07-31 NOTE — Anesthesia Postprocedure Evaluation (Signed)
Anesthesia Post Note Late Entry for 901 am Patient: Timothy [name]  Procedure(s) Performed: CYSTOSCOPY WITH BIOPSY (N/A ) CYSTOSCOPY WITH BILATERAL RETROGRADE PYELOGRAM (Bilateral )  Patient location during evaluation: PACU Anesthesia Type: General Level of consciousness: awake and alert and oriented Pain management: pain level controlled Vital Signs Assessment: post-procedure vital signs reviewed and stable Respiratory status: spontaneous breathing Cardiovascular status: blood pressure returned to baseline Postop Assessment: no apparent nausea or vomiting Anesthetic complications: no     Last Vitals:  Vitals:   07/31/18 0909 07/31/18 0912  BP:  (!) 159/91  Pulse: 69   Resp: 16   Temp: 36.6 C   SpO2: 94%     Last Pain:  Vitals:   07/31/18 0909  TempSrc:   PainSc: 2                  Pammy Vesey A

## 2018-08-01 ENCOUNTER — Encounter (HOSPITAL_COMMUNITY): Payer: Self-pay | Admitting: Urology

## 2018-08-03 ENCOUNTER — Encounter: Payer: Self-pay | Admitting: Cardiology

## 2018-08-03 ENCOUNTER — Ambulatory Visit: Payer: Medicare HMO | Admitting: Cardiology

## 2018-08-03 VITALS — BP 142/82 | HR 76 | Ht 68.0 in | Wt 208.0 lb

## 2018-08-03 DIAGNOSIS — I255 Ischemic cardiomyopathy: Secondary | ICD-10-CM | POA: Diagnosis not present

## 2018-08-03 DIAGNOSIS — I10 Essential (primary) hypertension: Secondary | ICD-10-CM

## 2018-08-03 DIAGNOSIS — I739 Peripheral vascular disease, unspecified: Secondary | ICD-10-CM

## 2018-08-03 DIAGNOSIS — E782 Mixed hyperlipidemia: Secondary | ICD-10-CM

## 2018-08-03 DIAGNOSIS — I2510 Atherosclerotic heart disease of native coronary artery without angina pectoris: Secondary | ICD-10-CM

## 2018-08-03 MED ORDER — HYDRALAZINE HCL 25 MG PO TABS: 25.0000 mg | ORAL_TABLET | Freq: Three times a day (TID) | ORAL | 3 refills | Status: DC

## 2018-08-03 NOTE — Progress Notes (Signed)
Clinical Summary Mr. Domine is a 72 y.o.male seen today for follow up of the medical problems.   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 Echo LVEF 50-55%, inferior hypokinesis, grade I diastolic dysfunction. - admit 11/2016 with NSTEMI. Cath as reported below. Received balloon angioplasty of mid to distal LCX, too small to be stented.  - echo 11/2016 LVEF 40-45%.   - last visit he was doing well without symptoms but was noted to have new inferior and lateral precordial TWIs, and lateral Qwaves - referred for stress test. Jan 2019 nuclear stress small moderate intensity partially reversible mid to apical anterior defect consistent with mild ischemia. LVEF 25% by nuclear, however echo 02/2018 showed LVEF stabel 40-45%.  - no recent chest pain, SOB, or DOE - compliant with meds.   2. HTN - norvasc that we started last visit caused foot pain, stopped and resolved.   3. Hyperlipidemia - intolerant to statins, has been on zetia only. - labs followed by pcp  4. PAD - 07/2018 ABIs right 1.12, left 0.8 - recent left sided claudication, has appt coming up with vascular    AAA screen: neg [place] 2018 Past Medical History:  Diagnosis Date  . Arthritis   . Bladder cancer ([place])   . CAD (coronary artery disease)    a. 06/2014: NSTEMI s/p PTCA of LCx, 60-80% residual in distal LAD, 70% prox small D1.   b. 02/06/15 NSTEMI  s/p successful PCI/DES to mid RCA  . Diabetes mellitus, type 2 ([place])   . Elevated PSA   . HLD (hyperlipidemia)   . HOH (hard of hearing)   . Hypertension   . NSTEMI (non-ST elevated myocardial infarction) ([facility])      Allergies  Allergen Reactions  . Lipitor [Atorvastatin] Other (See Comments)    Myalgia  . Other Other (See Comments)    All Cholesterol Meds - causes muscle and joint pain  . Pravastatin Other (See Comments)    Myalgia     Current Outpatient Medications  Medication Sig Dispense Refill  . amLODipine  (NORVASC) 5 MG tablet Take 1 tablet (5 mg total) by mouth daily. (Patient not taking: Reported on 07/27/2018) 180 tablet 3  . aspirin 81 MG chewable tablet Chew 1 tablet (81 mg total) by mouth daily.    . cephALEXin (KEFLEX) 500 MG capsule Take 1 capsule (500 mg total) by mouth 2 (two) times daily. 6 capsule 0  . cholecalciferol (VITAMIN D) 400 units TABS tablet Take 400 Units by mouth.    . diclofenac (VOLTAREN) 75 MG EC tablet Take 75 mg by mouth 2 (two) times daily as needed (for foot pain).    Marland Kitchen lisinopril (PRINIVIL,ZESTRIL) 20 MG tablet Take 20 mg by mouth daily.    Marland Kitchen lisinopril (PRINIVIL,ZESTRIL) 40 MG tablet Take 1 tablet (40 mg total) by mouth daily. (Patient not taking: Reported on 07/27/2018) 30 tablet 6  . metFORMIN (GLUCOPHAGE) 500 MG tablet Take 1 tablet (500 mg total) by mouth 2 (two) times daily with a meal. (Patient taking differently: Take 500 mg by mouth daily. ) 60 tablet 11  . metoprolol succinate (TOPROL-XL) 25 MG 24 hr tablet TAKE 3 TABLES TOTAL DAILY (Patient taking differently: Take 25 mg by mouth every 8 (eight) hours. ) 270 tablet 3  . Omega-3 Fatty Acids (FISH OIL) 1000 MG CAPS Take 1,000 mg by mouth once a week.    Vladimir Faster Glycol-Propyl Glycol (SYSTANE OP) Place 1-2 drops into  both eyes daily as needed (dry eyes).      No current facility-administered medications for this visit.      Past Surgical History:  Procedure Laterality Date  . CARDIAC CATHETERIZATION  02/06/2015   Procedure: CORONARY STENT INTERVENTION;  Surgeon: [name], MD;  Location: [facility] CATH LAB;  Service: Cardiovascular;;  Mid RCA  . CARDIAC CATHETERIZATION N/A 11/11/2016   Procedure: Left Heart Cath and Coronary Angiography;  Surgeon: [name], MD;  Location: [place] CV LAB;  Service: Cardiovascular;  Laterality: N/A;  . CARDIAC CATHETERIZATION N/A 11/11/2016   Procedure: Coronary Balloon Angioplasty;  Surgeon: [name], MD;  Location: [place] CV LAB;  Service:  Cardiovascular;  Laterality: N/A;  . CORONARY ANGIOPLASTY  11/11/2016   Mid Cx to Dist Cx lesion, 100 %stenosed. This lesion was treated with balloon angioplasty with a 2.0 balloon  . CYSTOSCOPY W/ RETROGRADES N/A 05/05/2015   Procedure: CYSTOSCOPY WITH BILATERAL RETROGRADE PYELOGRAMS AND BLADDER BIOPSIES;  Surgeon: [name], MD;  Location: [facility];  Service: Urology;  Laterality: N/A;  . CYSTOSCOPY W/ RETROGRADES Bilateral 07/31/2018   Procedure: CYSTOSCOPY WITH BILATERAL RETROGRADE PYELOGRAM;  Surgeon: [name], MD;  Location: [facility];  Service: Urology;  Laterality: Bilateral;  . CYSTOSCOPY WITH BIOPSY N/A 07/31/2018   Procedure: CYSTOSCOPY WITH BIOPSY;  Surgeon: [name], MD;  Location: [facility];  Service: Urology;  Laterality: N/A;  . LEFT HEART CATHETERIZATION WITH CORONARY ANGIOGRAM N/A 06/17/2014   Procedure: LEFT HEART CATHETERIZATION WITH CORONARY ANGIOGRAM;  Surgeon: [name], MD;  [facility] C[facility] LAB;  Service: Cardiovascular;  Laterality: N/A;  . LEFT HEART CATHETERIZATION WITH CORONARY ANGIOGRAM N/A 02/06/2015   Procedure: LEFT HEART CATHETERIZATION WITH CORONARY ANGIOGRAM;  Surgeon: [name], MD;  Location: [facility] CATH LAB;  Service: Cardiovascular;  Laterality: N/A;  . PROSTATE BIOPSY N/A 03/31/2014   Procedure: BIOPSY TRANSRECTAL ULTRASONIC PROSTATE (TUBP);  Surgeon: [name], MD;  Location: [facility];  Service: Urology;  Laterality: N/A;  . TRANSURETHRAL RESECTION OF BLADDER TUMOR N/A 03/14/2013   Procedure: TRANSURETHRAL RESECTION OF BLADDER TUMOR (TURBT);  Surgeon: [name], MD;  Location: [facility];  Service: Urology;  Laterality: N/A;  1 HR WITH MITOMYCIN INSTILLATION   . TRANSURETHRAL RESECTION OF BLADDER TUMOR N/A 09/30/2013   Procedure: TRANSURETHRAL RESECTION OF BLADDER TUMOR (TURBT);  Surgeon: [name], MD;  Location: [facility];  Service: Urology;  Laterality: N/A;   . TRANSURETHRAL RESECTION OF BLADDER TUMOR N/A 03/31/2014   Procedure: TRANSURETHRAL RESECTION OF BLADDER TUMOR (TURBT);  Surgeon: [name], MD;  Location: [facility];  Service: Urology;  Laterality: N/A;  . TRANSURETHRAL RESECTION OF BLADDER TUMOR N/A 05/31/2015   Procedure: Cystoscopy with clott evacuation and fulgeration of bleeders, retrograde pylegram;  Surgeon: [name], MD;  Location: [facility];  Service: Urology;  Laterality: N/A;     Allergies  Allergen Reactions  . Lipitor [Atorvastatin] Other (See Comments)    Myalgia  . Other Other (See Comments)    All Cholesterol Meds - causes muscle and joint pain  . Pravastatin Other (See Comments)    Myalgia      Family History  Problem Relation Age of Onset  . Cancer Child   . Asthma Mother      Social History Mr. Moure reports that he quit smoking about 25 years ago. His smoking use included cigarettes. He has a 30.00 pack-year smoking history. He has never used smokeless tobacco.  Mr. Bogacz reports that he does not drink alcohol.   Review of Systems CONSTITUTIONAL: No weight loss, fever, chills, weakness or fatigue.  HEENT: Eyes: No visual loss, blurred vision, double vision or yellow sclerae.No hearing loss, sneezing, congestion, runny nose or sore throat.  SKIN: No rash or itching.  CARDIOVASCULAR: per hpi RESPIRATORY: No shortness of breath, cough or sputum.  GASTROINTESTINAL: No anorexia, nausea, vomiting or diarrhea. No abdominal pain or blood.  GENITOURINARY: No burning on urination, no polyuria NEUROLOGICAL: No headache, dizziness, syncope, paralysis, ataxia, numbness or tingling in the extremities. No change in bowel or bladder control.  MUSCULOSKELETAL: left leg pain LYMPHATICS: No enlarged nodes. No history of splenectomy.  PSYCHIATRIC: No history of depression or anxiety.  ENDOCRINOLOGIC: No reports of sweating, cold or heat intolerance. No polyuria or polydipsia.   Marland Kitchen   Physical Examination Vitals:   08/03/18 1036  BP: (!) 142/82  Pulse: 76  SpO2: 97%   Vitals:   08/03/18 1036  Weight: 208 lb (94.3 kg)  Height: '5\' 8"'  (1.727 m)    Gen: resting comfortably, no acute distress HEENT: no scleral icterus, pupils equal round and reactive, no palptable cervical adenopathy,  CV: RRR, no m/r/g, no jvd Resp: Clear to auscultation bilaterally GI: abdomen is soft, non-tender, non-distended, normal bowel sounds, no hepatosplenomegaly MSK: extremities are warm, no edema.  Skin: warm, no rash Neuro:  no focal deficits Psych: appropriate affect   Diagnostic Studies 06/2014 Cath FINDINGS: Hemodynamics:  Central Aortic Pressure / Mean: 108/62/83 mmHg  Left Ventricular Pressure / LVEDP: 107/7/17 mmHg  Left Ventriculography:  EF:~45% %  Wall Motion:global Hypokinesis  Coronary Anatomy:  Dominance: Right  Left Main: Large caliber, long trunk that trifurcates into the LAD, Circumflex & Ramus Intermedius. Angiographically normal. BOF:BPZWCH as a large caliber vessel that tapers into a relatively small caliber (~1.5-2.0 mm) vessel distally. It wraps the apex, perfusing the distal 1/3 of the infero-apex. The distal vessel has tandem 60&70-80% focal lesions prior to the apex.  D1:Very small caliber bifurcating vessel with proximal ~70% stenosis.  Left Circumflex:Begins as a large caliber vessel that also tapers to a small to moderate caliber vessel in the AV Groove where it gives off a small caliber OM1 branch and is 100% occluded just beyond that point.   Post-PTCA: beyond the occluded AV Groove Circumflex, there is a small to moderate caliber LPL branch.  Ramus intermedius:Large caliber vessel that bifurcates in the mid-vessel into to small to moderate caliber branches that both reach the apex.   RCA: Large caliber, dominant vessel with diffuse ~20-40% lesions, but no obstructive disease. It bifurcates distally in to a small  caliber, short PDA and Right Posterior AV Groove Branch (RPAV) branches. RPAV gives off several very small banches.  After reviewing the initial angiography, the culprit lesion was thought to be the occluded distal-AV Groove circumflesx. Preparation were made to proceed with PTCA on this lesion.  Percutaneous Coronary Intervention:  Guide: 6 Fr CLS 3.5 -- very difficult guide support, catheter moved &disengaged with patient movement Guidewire: BMW (advanced initially in to an atrial branch for support & balloon advanced into proximal Circumflex allowing for further advancmemt of the wire beyond the culprit lesion. Predilation Balloon #1: Emerge 1.5 mm x 12 mm; Used to assist with wire support to advance guidewire ? 6 Atm x 30 Sec, 8 Atm x 30 Sec ? Post inflation revealed a small to moderate follow-on circumflex with LPL branch.  ? Due to the relatively small sized distal  vessel, the decision was to perform PTCA only. Balloon #2: Euphora 2.0 mm x 12 mm;  ? 8 Atm x 90 Sec x 2 with IC NTG - no recoil ? Final Diameter: ~2.0 mm  Post deployment angiography in multiple views, with and without guidewire in place revealed excellent stent deployment and lesion coverage. There was no evidence of dissection or perforation.  POST-OPERATIVE DIAGNOSIS:   Severe 2 vessel disease in small caliber vessel - distal AV Groove Circumflex 100% and distal LAD ~60&80% lesions (1.5 mm vessel)  Successful PTCA of the occluded AV Groove Circumflex with Stent-like result.  Moderately reduced LVEF by LV Gram  Normal LVEDP   06/2014 Echo Study Conclusions  - Left ventricle: The cavity size was normal. Wall thickness was normal. Systolic function was normal. The estimated ejection fraction was in the range of 50% to 55%. Inferior hypokinesis. Doppler parameters are consistent with abnormal left ventricular relaxation (grade 1 diastolic dysfunction). The E/e&' ratio is 15, suggesting  borderline elevated LV filling pressure. - Aortic valve: Trileaflet. Sclerosis without stenosis. There was no regurgitation. - Left atrium: The atrium was normal in size. - Tricuspid valve: There was mild regurgitation. - Pulmonary arteries: PA peak pressure: 33 mm Hg (S).  Impressions:  - LVEF 50-55%, inferior hypokinesis, mild TR, top normal RVSP, diastolic dysfunction with borderline elevated LV filling pressure.   11/2016 echo Study Conclusions  - Left ventricle: The cavity size was normal. Wall thickness was normal. Systolic function was mildly to moderately reduced. The estimated ejection fraction was in the range of 40% to 45%. There is akinesis of the basal-midinferior and inferoseptal myocardium. Doppler parameters are consistent with abnormal left ventricular relaxation (grade 1 diastolic dysfunction). - Aortic valve: Trileaflet; mildly thickened, mildly calcified leaflets. There was trivial regurgitation.  Impressions:  - EF is mildly reduced when compared to prior.   11/2016 cath  There is mild left ventricular systolic dysfunction.  The left ventricular ejection fraction is 45-50% by visual estimate.  Mid RCA-2 lesion, 25 %stenosed.  Patent mid RCA stent.  Ost 2nd Diag to 2nd Diag lesion, 80 %stenosed. Unchanged from prior.  Dist LAD lesion, 60 %stenosed. Unchanged from prior.  Mid Cx to Dist Cx lesion, 100 %stenosed. This lesion was treated with balloon angioplasty with a 2.0 balloon. Several inflations were performed.  Post intervention, there is a 0% residual stenosis. The vessel was too small to be stented. There is also significant tortuosity in the proximal vessel.  LV end diastolic pressure is mildly elevated.  Initially, Brilinta was given for antiplatelet therapy in the Cath Lab. Since he did not receive a stent, we will transition him to clopidogrel. He needs aggressive secondary prevention including lipid-lowering  therapy. He has been intolerant to statins in the past. Would need to consider PCS K9 inhibitor along with other secondary prevention. I anticipate he'll be here 2 days in the hospital. Echocardiogram would be reasonable to check ejection fraction and mitral apparatus.  LAD and diagonal disease was not addressed since they appeared unchanged angiographically. If he has refractory angina, could address the LAD lesion at a later time.  Jan 2018 AAA [place] No AAA   Jan 2019 nuclear stress  No diagnostic ST segment changes. Occasional PVCs noted throughout the study. Hypertensive response. Technically low risk Duke treadmill score 4.5.  Blood pressure demonstrated a hypertensive response to exercise.  Small, moderate intensity, partially reversible mid to apical anterior defect consistent with ischemia.  This is a high risk study predominantly  based on calculated LVEF. Consider echocardiogram for further confirmation.  Nuclear stress EF: 25%.   02/2018 echo Study Conclusions  - Left ventricle: Mid and basal inferior wall hypokinesis. The   cavity size was mildly dilated. Wall thickness was normal.   Systolic function was mildly to moderately reduced. The estimated   ejection fraction was in the range of 40% to 45%. - Aortic valve: There was trivial regurgitation. Valve area (VTI):   1.51 cm^2. Valve area (Vmax): 1.75 cm^2. Valve area (Vmean): 1.64   cm^2. - Atrial septum: No defect or patent foramen ovale was identified. - Pulmonary arteries: PA peak pressure: 37 mm Hg (S).   Assessment and Plan   1. CAD/ICM - no symptoms, recent cardiac testing as reported above due to asymptomatic EKG changes - continue current medical therapy.   2. HTN - did not tolerate norvasc - bp remains elevated - limited options due to his decreased renal function. Start hydralazine 67m tid, nursing visit for bp check in 2 weeks.   3. Hyperlipidemia - intolerant to statins. COntinue zetia, request  labs from pcp  4. PAD - has appt coming up with vascular - continue medical therapy    F/u 6 months      JArnoldo Lenis M.D.

## 2018-08-03 NOTE — Patient Instructions (Addendum)
Your physician wants you to follow-up in:6 months  with Dr.Branch You will receive a reminder letter in the mail two months in advance. If you don't receive a letter, please call our office to schedule the follow-up appointment.    START Hydralazine 25 mg , three times a day    Get Nurse apt in 2 weeks for BP check     If you need a refill on your cardiac medications before your next appointment, please call your pharmacy.     No lab work or tests today     Thank you for choosing [facility] !

## 2018-08-08 NOTE — Op Note (Signed)
Preoperative diagnosis: History of bladder cancer with recent abnormal ladder cytology  Postoperative diagnosis: Same  Operative procedure: Cystoscopy, bilateral retrograde ureteropyelograms, fluoroscopic interpretation, bilateral renal washings for cytology, bladder biopsy 2  Surgeon: Tadhg Eskew  Anesthesia: General with LMA  Complications: None.  Drains: 40 French Foley catheter.  Specimens: Bladder washings for cytology, right renal washings for cytology, left renal washings for cytology, bladder biopsies  Complications: None  Estimated blood loss: Less than 10 mL  Indications: 72 year old male with history of high-grade non-muscle invasive bladder cancer.  He is status post BCG induction and maintenance.  Recent cystoscopy revealed persistent erythematous area on his bladder as well as cytology showing atypical findings.  He presents at this time for cystoscopy, biopsies and retrograde/washings.  Findings: Prostate moderately obstructive.  No prostatic urethral lesions.  Bladder revealed trabeculations, mild to moderate nature.  2 erythematous areas in the posterior bladder both about 1 cm in size.  No raised urothelium.  Ureteral orifices were normal.  Bilateral retrograde studies were performed with Omnipaque following bilateral renal washings with saline.  I will calyceal systems were normal bilaterally.  There were no filling defects.  Ureteral appearances were also normal.  Description of procedure: The patient was properly identified in the holding area.  He received preoperative IV antibiotics.  He was taken to the operating room where general anesthetic was administered.  He was placed in the dorsolithotomy position, genitalia and perineum were prepped and draped.  Timeout was performed.  73 French panendoscope was passed under direct visualization into the bladder.  The above-mentioned findings were noted.  Bladder washings were taken with saline and sent labeled "bladder  washings".  Bilateral renal washings were taken using 6 [place] open-ended catheter passed under fluoroscopic guidance into the pelves of the kidneys bilaterally.  These were sent separately for cytology.  They were labeled as right renal washings and left renal washings, respectively.  Following this, the same 6 [place] open-ended catheter was utilized for bilateral ureteropyelograms after the catheter was brought down to the ureterovesical junction.  Following normal findings, the open-ended catheter was removed.  Cold cup biopsy forceps were then used to remove the abnormal urothelium on the 2 erythematous areas.  These were sent labeled bladder biopsies.  Biopsy sites were then cauterized with the Bugbee electrode.  No significant bleeding was noted.  I then passed an 54 French Foley catheter following removal of the cystoscope.  Balloon filled with 10 cc of water, hooked to dependent drainage.  The procedure was then terminated.  The patient was awakened and taken to the PACU in stable condition.  He tolerated the procedure well.

## 2018-08-16 DIAGNOSIS — R69 Illness, unspecified: Secondary | ICD-10-CM | POA: Diagnosis not present

## 2018-08-17 ENCOUNTER — Ambulatory Visit (INDEPENDENT_AMBULATORY_CARE_PROVIDER_SITE_OTHER): Payer: Medicare HMO

## 2018-08-17 VITALS — BP 136/72 | HR 80 | Ht 68.0 in | Wt 204.0 lb

## 2018-08-17 DIAGNOSIS — Z0130 Encounter for examination of blood pressure without abnormal findings: Secondary | ICD-10-CM | POA: Diagnosis not present

## 2018-08-17 NOTE — Patient Instructions (Signed)
Stay on medications as planned    We will call you if anything changes     Thank you for choosing [facility] !

## 2018-08-20 ENCOUNTER — Encounter: Payer: Self-pay | Admitting: Vascular Surgery

## 2018-08-20 ENCOUNTER — Ambulatory Visit (INDEPENDENT_AMBULATORY_CARE_PROVIDER_SITE_OTHER): Payer: Medicare HMO | Admitting: Vascular Surgery

## 2018-08-20 ENCOUNTER — Encounter (HOSPITAL_COMMUNITY): Payer: Self-pay

## 2018-08-20 ENCOUNTER — Ambulatory Visit (HOSPITAL_COMMUNITY)
Admission: RE | Admit: 2018-08-20 | Discharge: 2018-08-20 | Disposition: A | Discharge status: Home / Self Care | Payer: Medicare HMO | Source: Ambulatory Visit | Attending: Vascular Surgery | Admitting: Vascular Surgery

## 2018-08-20 VITALS — BP 180/86 | HR 77 | Temp 96.8°F | Resp 20 | Wt 210.0 lb

## 2018-08-20 DIAGNOSIS — I70212 Atherosclerosis of native arteries of extremities with intermittent claudication, left leg: Secondary | ICD-10-CM

## 2018-08-20 DIAGNOSIS — M79604 Pain in right leg: Secondary | ICD-10-CM

## 2018-08-20 DIAGNOSIS — M79605 Pain in left leg: Principal | ICD-10-CM

## 2018-08-20 NOTE — H&P (View-Only) (Signed)
[facility] of [place]  Patient name: Timothy [name] MRN: [id number] DOB: 06-19-1946 Sex: male  REASON FOR CONSULT: Evaluation limiting claudication left leg  Seen today in our [place] office  HPI: Timothy [name] is a 72 y.o. male, who is today for evaluation of left leg total leg claudication.  He is here today with his wife.  He reports that for greater than 1 year he has had a sensation of tightness and aching when he walks.  He reports that this begins in his calf and then extends up into his thigh and can extend up into his buttocks.  He has no arterial rest pain and has no history of lower extremity tissue loss.  He reports that this is limiting to him.  He reports that this is worse if he is walking uphill or if he is walking in the sand.  Enjoys going to the beach with his family.  Also reports that if he is shopping he has to stop frequently because he is unable to continue walking.  He does have a history of coronary artery disease having undergone coronary angioplasty in December 2017.  Pain stable from this.  Past Medical History:  Diagnosis Date  . Arthritis   . Bladder cancer ([place])   . CAD (coronary artery disease)    a. 06/2014: NSTEMI s/p PTCA of LCx, 60-80% residual in distal LAD, 70% prox small D1.   b. 02/06/15 NSTEMI  s/p successful PCI/DES to mid RCA  . Diabetes mellitus, type 2 ([place])   . Elevated PSA   . HLD (hyperlipidemia)   . HOH (hard of hearing)   . Hypertension   . NSTEMI (non-ST elevated myocardial infarction) ([place])     Family History  Problem Relation Age of Onset  . Cancer Child   . Asthma Mother     SOCIAL HISTORY: Social History   Socioeconomic History  . Marital status: Married    Spouse name: Not on file  . Number of children: Not on file  . Years of education: Not on file  . Highest education level: Not on file  Occupational History  . Not on file  Social Needs  . Financial  resource strain: Not on file  . Food insecurity:    Worry: Not on file    Inability: Not on file  . Transportation needs:    Medical: Not on file    Non-medical: Not on file  Tobacco Use  . Smoking status: Former Smoker    Packs/day: 1.00    Years: 30.00    Pack years: 30.00    Types: Cigarettes    Last attempt to quit: 03/11/1993    Years since quitting: 25.4  . Smokeless tobacco: Never Used  Substance and Sexual Activity  . Alcohol use: No    Alcohol/week: 0.0 standard drinks  . Drug use: No  . Sexual activity: Not Currently  Lifestyle  . Physical activity:    Days per week: Not on file    Minutes per session: Not on file  . Stress: Not on file  Relationships  . Social connections:    Talks on phone: Not on file    Gets together: Not on file    Attends religious service: Not on file    Active member of club or organization: Not on file    Attends meetings of clubs or organizations: Not on file    Relationship status: Not on file  . Intimate partner violence:  Fear of current or ex partner: Not on file    Emotionally abused: Not on file    Physically abused: Not on file    Forced sexual activity: Not on file  Other Topics Concern  . Not on file  Social History Narrative  . Not on file    Allergies  Allergen Reactions  . Lipitor [Atorvastatin] Other (See Comments)    Myalgia  . Other Other (See Comments)    All Cholesterol Meds - causes muscle and joint pain  . Pravastatin Other (See Comments)    Myalgia    Current Outpatient Medications  Medication Sig Dispense Refill  . aspirin 81 MG chewable tablet Chew 1 tablet (81 mg total) by mouth daily.    . cholecalciferol (VITAMIN D) 400 units TABS tablet Take 400 Units by mouth.    . diclofenac (VOLTAREN) 75 MG EC tablet Take 75 mg by mouth 2 (two) times daily as needed (for foot pain).    . hydrALAZINE (APRESOLINE) 25 MG tablet Take 1 tablet (25 mg total) by mouth 3 (three) times daily. 270 tablet 3  .  lisinopril (PRINIVIL,ZESTRIL) 20 MG tablet Take 20 mg by mouth daily.    . metFORMIN (GLUCOPHAGE) 500 MG tablet Take 1 tablet (500 mg total) by mouth 2 (two) times daily with a meal. (Patient taking differently: Take 500 mg by mouth daily. ) 60 tablet 11  . metoprolol succinate (TOPROL-XL) 25 MG 24 hr tablet TAKE 3 TABLES TOTAL DAILY (Patient taking differently: Take 25 mg by mouth every 8 (eight) hours. ) 270 tablet 3  . Omega-3 Fatty Acids (FISH OIL) 1000 MG CAPS Take 1,000 mg by mouth once a week.    Vladimir Faster Glycol-Propyl Glycol (SYSTANE OP) Place 1-2 drops into both eyes daily as needed (dry eyes).      No current facility-administered medications for this visit.     REVIEW OF SYSTEMS:  [X]  denotes positive finding, [ ]  denotes negative finding Cardiac  Comments:  Chest pain or chest pressure:    Shortness of breath upon exertion:    Short of breath when lying flat:    Irregular heart rhythm:        Vascular    Pain in calf, thigh, or hip brought on by ambulation: x   Pain in feet at night that wakes you up from your sleep:     Blood clot in your veins:    Leg swelling:         Pulmonary    Oxygen at home:    Productive cough:     Wheezing:         Neurologic    Sudden weakness in arms or legs:     Sudden numbness in arms or legs:     Sudden onset of difficulty speaking or slurred speech:    Temporary loss of vision in one eye:     Problems with dizziness:         Gastrointestinal    Blood in stool:     Vomited blood:         Genitourinary    Burning when urinating:     Blood in urine:        Psychiatric    Major depression:         Hematologic    Bleeding problems:    Problems with blood clotting too easily:        Skin    Rashes or ulcers:  Constitutional    Fever or chills:      PHYSICAL EXAM: Vitals:   08/20/18 1257 08/20/18 1259  BP: (!) 157/85 (!) 180/86  Pulse: 80 77  Resp: 20   Temp: (!) 96.8 F (36 C)   TempSrc: Temporal     Weight: 210 lb (95.3 kg)     GENERAL: The patient is a well-nourished male, in no acute distress. The vital signs are documented above. CARDIOVASCULAR: Carotid arteries without bruits bilaterally.  2+ radial pulses bilaterally.  Does have 2+ right dorsalis pedis pulse and absent pedal pulse on the left PULMONARY: There is good air exchange  ABDOMEN: Soft and non-tender  MUSCULOSKELETAL: There are no major deformities or cyanosis. NEUROLOGIC: No focal weakness or paresthesias are detected. SKIN: There are no ulcers or rashes noted. PSYCHIATRIC: The patient has a normal affect.  DATA:  Invasive studies from [facility] 2018-08-07 were reviewed.  This shows an ankle arm index of 0.8 on the right and 1.1 on the left  MEDICAL ISSUES: Had a long discussion with the patient and his wife present.  Explained the significance of his left lower extremity arterial insufficiency and claudication.  I explained that this is not limb threatening.  I did discuss the option of arteriography for further evaluation.  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  I explained that if this is the case he would have a very high likelihood of having durable treatment with iliac intervention.  He reports that he is limited by his inability to walk and wishes to pursue further evaluation and treatment.  We will schedule this at his convenience at [facility].  He is on aspirin and Plavix and we will continue this for the procedure.   Rosetta Posner, MD FACS [facility] Tel [phone] Pager [phone]

## 2018-08-20 NOTE — Progress Notes (Signed)
BP is ok from nursing check, no changes at this time   Zandra Abts MD

## 2018-08-20 NOTE — Progress Notes (Signed)
V[facility] and Vein Specialist of [place]  Patient name: Timothy [name] MRN: [id number] DOB: 1946-09-16 Sex: male  REASON FOR CONSULT: Evaluation limiting claudication left leg  Seen today in our [place] office  HPI: Timothy [name] is a 72 y.o. male, who is today for evaluation of left leg total leg claudication.  He is here today with his wife.  He reports that for greater than 1 year he has had a sensation of tightness and aching when he walks.  He reports that this begins in his calf and then extends up into his thigh and can extend up into his buttocks.  He has no arterial rest pain and has no history of lower extremity tissue loss.  He reports that this is limiting to him.  He reports that this is worse if he is walking uphill or if he is walking in the sand.  Enjoys going to the beach with his family.  Also reports that if he is shopping he has to stop frequently because he is unable to continue walking.  He does have a history of coronary artery disease having undergone coronary angioplasty in December 2017.  Pain stable from this.  Past Medical History:  Diagnosis Date  . Arthritis   . Bladder cancer ([place])   . CAD (coronary artery disease)    a. 06/2014: NSTEMI s/p PTCA of LCx, 60-80% residual in distal LAD, 70% prox small D1.   b. 02/06/15 NSTEMI  s/p successful PCI/DES to mid RCA  . Diabetes mellitus, type 2 ([place])   . Elevated PSA   . HLD (hyperlipidemia)   . HOH (hard of hearing)   . Hypertension   . NSTEMI (non-ST elevated myocardial infarction) ([place])     Family History  Problem Relation Age of Onset  . Cancer Child   . Asthma Mother     SOCIAL HISTORY: Social History   Socioeconomic History  . Marital status: Married    Spouse name: Not on file  . Number of children: Not on file  . Years of education: Not on file  . Highest education level: Not on file  Occupational History  . Not on file  Social Needs  . Financial  resource strain: Not on file  . Food insecurity:    Worry: Not on file    Inability: Not on file  . Transportation needs:    Medical: Not on file    Non-medical: Not on file  Tobacco Use  . Smoking status: Former Smoker    Packs/day: 1.00    Years: 30.00    Pack years: 30.00    Types: Cigarettes    Last attempt to quit: 03/11/1993    Years since quitting: 25.4  . Smokeless tobacco: Never Used  Substance and Sexual Activity  . Alcohol use: No    Alcohol/week: 0.0 standard drinks  . Drug use: No  . Sexual activity: Not Currently  Lifestyle  . Physical activity:    Days per week: Not on file    Minutes per session: Not on file  . Stress: Not on file  Relationships  . Social connections:    Talks on phone: Not on file    Gets together: Not on file    Attends religious service: Not on file    Active member of club or organization: Not on file    Attends meetings of clubs or organizations: Not on file    Relationship status: Not on file  . Intimate partner violence:  Fear of current or ex partner: Not on file    Emotionally abused: Not on file    Physically abused: Not on file    Forced sexual activity: Not on file  Other Topics Concern  . Not on file  Social History Narrative  . Not on file    Allergies  Allergen Reactions  . Lipitor [Atorvastatin] Other (See Comments)    Myalgia  . Other Other (See Comments)    All Cholesterol Meds - causes muscle and joint pain  . Pravastatin Other (See Comments)    Myalgia    Current Outpatient Medications  Medication Sig Dispense Refill  . aspirin 81 MG chewable tablet Chew 1 tablet (81 mg total) by mouth daily.    . cholecalciferol (VITAMIN D) 400 units TABS tablet Take 400 Units by mouth.    . diclofenac (VOLTAREN) 75 MG EC tablet Take 75 mg by mouth 2 (two) times daily as needed (for foot pain).    . hydrALAZINE (APRESOLINE) 25 MG tablet Take 1 tablet (25 mg total) by mouth 3 (three) times daily. 270 tablet 3  .  lisinopril (PRINIVIL,ZESTRIL) 20 MG tablet Take 20 mg by mouth daily.    . metFORMIN (GLUCOPHAGE) 500 MG tablet Take 1 tablet (500 mg total) by mouth 2 (two) times daily with a meal. (Patient taking differently: Take 500 mg by mouth daily. ) 60 tablet 11  . metoprolol succinate (TOPROL-XL) 25 MG 24 hr tablet TAKE 3 TABLES TOTAL DAILY (Patient taking differently: Take 25 mg by mouth every 8 (eight) hours. ) 270 tablet 3  . Omega-3 Fatty Acids (FISH OIL) 1000 MG CAPS Take 1,000 mg by mouth once a week.    Vladimir Faster Glycol-Propyl Glycol (SYSTANE OP) Place 1-2 drops into both eyes daily as needed (dry eyes).      No current facility-administered medications for this visit.     REVIEW OF SYSTEMS:  [X]  denotes positive finding, [ ]  denotes negative finding Cardiac  Comments:  Chest pain or chest pressure:    Shortness of breath upon exertion:    Short of breath when lying flat:    Irregular heart rhythm:        Vascular    Pain in calf, thigh, or hip brought on by ambulation: x   Pain in feet at night that wakes you up from your sleep:     Blood clot in your veins:    Leg swelling:         Pulmonary    Oxygen at home:    Productive cough:     Wheezing:         Neurologic    Sudden weakness in arms or legs:     Sudden numbness in arms or legs:     Sudden onset of difficulty speaking or slurred speech:    Temporary loss of vision in one eye:     Problems with dizziness:         Gastrointestinal    Blood in stool:     Vomited blood:         Genitourinary    Burning when urinating:     Blood in urine:        Psychiatric    Major depression:         Hematologic    Bleeding problems:    Problems with blood clotting too easily:        Skin    Rashes or ulcers:  Constitutional    Fever or chills:      PHYSICAL EXAM: Vitals:   08/20/18 1257 08/20/18 1259  BP: (!) 157/85 (!) 180/86  Pulse: 80 77  Resp: 20   Temp: (!) 96.8 F (36 C)   TempSrc: Temporal     Weight: 210 lb (95.3 kg)     GENERAL: The patient is a well-nourished male, in no acute distress. The vital signs are documented above. CARDIOVASCULAR: Carotid arteries without bruits bilaterally.  2+ radial pulses bilaterally.  Does have 2+ right dorsalis pedis pulse and absent pedal pulse on the left PULMONARY: There is good air exchange  ABDOMEN: Soft and non-tender  MUSCULOSKELETAL: There are no major deformities or cyanosis. NEUROLOGIC: No focal weakness or paresthesias are detected. SKIN: There are no ulcers or rashes noted. PSYCHIATRIC: The patient has a normal affect.  DATA:  Invasive studies from [facility] Jul 24, 2018 were reviewed.  This shows an ankle arm index of 0.8 on the right and 1.1 on the left  MEDICAL ISSUES: Had a long discussion with the patient and his wife present.  Explained the significance of his left lower extremity arterial insufficiency and claudication.  I explained that this is not limb threatening.  I did discuss the option of arteriography for further evaluation.  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  I explained that if this is the case he would have a very high likelihood of having durable treatment with iliac intervention.  He reports that he is limited by his inability to walk and wishes to pursue further evaluation and treatment.  We will schedule this at his convenience at [facility].  He is on aspirin and Plavix and we will continue this for the procedure.   Rosetta Posner, MD FACS [facility] Tel [phone] Pager [phone]

## 2018-08-21 ENCOUNTER — Other Ambulatory Visit: Payer: Self-pay | Admitting: *Deleted

## 2018-08-21 NOTE — Progress Notes (Signed)
Reviewed multiple time with patient and wife over the phone. Instructed to be at [facility] admitting department at 8:30 am on 08/28/18 for procedure. Hold PM dose Metformin on 08/27/18. NPO past MN night prior and take the following medications with sips of water the morning of 08/28/18: Metoprolol, Lisinopril, Hydralazine. Hold all others until after this procedure. Must have a driver and caregiver for discharge to home.Verbalized understanding and had wife write everything down. To call if questions.

## 2018-08-22 ENCOUNTER — Encounter

## 2018-08-22 ENCOUNTER — Ambulatory Visit: Payer: Medicare HMO | Admitting: Cardiology

## 2018-08-24 DIAGNOSIS — E1151 Type 2 diabetes mellitus with diabetic peripheral angiopathy without gangrene: Secondary | ICD-10-CM | POA: Diagnosis not present

## 2018-08-28 ENCOUNTER — Other Ambulatory Visit: Payer: Self-pay

## 2018-08-28 ENCOUNTER — Ambulatory Visit (HOSPITAL_COMMUNITY)
Admission: RE | Admit: 2018-08-28 | Discharge: 2018-08-28 | Disposition: A | Discharge status: Home / Self Care | Payer: Medicare HMO | Source: Ambulatory Visit | Attending: Surgery | Admitting: Surgery

## 2018-08-28 ENCOUNTER — Ambulatory Visit (HOSPITAL_COMMUNITY)
Admission: RE | Disposition: A | Discharge status: Home / Self Care | Payer: Self-pay | Source: Ambulatory Visit | Attending: Surgery

## 2018-08-28 DIAGNOSIS — Z7984 Long term (current) use of oral hypoglycemic drugs: Secondary | ICD-10-CM | POA: Diagnosis not present

## 2018-08-28 DIAGNOSIS — E785 Hyperlipidemia, unspecified: Secondary | ICD-10-CM | POA: Insufficient documentation

## 2018-08-28 DIAGNOSIS — Z955 Presence of coronary angioplasty implant and graft: Secondary | ICD-10-CM | POA: Insufficient documentation

## 2018-08-28 DIAGNOSIS — I10 Essential (primary) hypertension: Secondary | ICD-10-CM | POA: Diagnosis not present

## 2018-08-28 DIAGNOSIS — Z87891 Personal history of nicotine dependence: Secondary | ICD-10-CM | POA: Insufficient documentation

## 2018-08-28 DIAGNOSIS — Z7902 Long term (current) use of antithrombotics/antiplatelets: Secondary | ICD-10-CM | POA: Insufficient documentation

## 2018-08-28 DIAGNOSIS — I252 Old myocardial infarction: Secondary | ICD-10-CM | POA: Diagnosis not present

## 2018-08-28 DIAGNOSIS — M1990 Unspecified osteoarthritis, unspecified site: Secondary | ICD-10-CM | POA: Insufficient documentation

## 2018-08-28 DIAGNOSIS — I70212 Atherosclerosis of native arteries of extremities with intermittent claudication, left leg: Secondary | ICD-10-CM | POA: Insufficient documentation

## 2018-08-28 DIAGNOSIS — E1151 Type 2 diabetes mellitus with diabetic peripheral angiopathy without gangrene: Secondary | ICD-10-CM | POA: Diagnosis present

## 2018-08-28 DIAGNOSIS — Z7982 Long term (current) use of aspirin: Secondary | ICD-10-CM | POA: Insufficient documentation

## 2018-08-28 DIAGNOSIS — I2510 Atherosclerotic heart disease of native coronary artery without angina pectoris: Secondary | ICD-10-CM | POA: Insufficient documentation

## 2018-08-28 HISTORY — PX: ABDOMINAL AORTOGRAM W/LOWER EXTREMITY: CATH118223

## 2018-08-28 HISTORY — PX: PERIPHERAL VASCULAR ATHERECTOMY: CATH118256

## 2018-08-28 LAB — POCT I-STAT, CHEM 8
BUN: 15 mg/dL (ref 8–23)
CHLORIDE: 107 mmol/L (ref 98–111)
CREATININE: 1.4 mg/dL — AB (ref 0.61–1.24)
Calcium, Ion: 1.13 mmol/L — ABNORMAL LOW (ref 1.15–1.40)
Glucose, Bld: 173 mg/dL — ABNORMAL HIGH (ref 70–99)
HEMATOCRIT: 43 % (ref 39.0–52.0)
Hemoglobin: 14.6 g/dL (ref 13.0–17.0)
POTASSIUM: 4 mmol/L (ref 3.5–5.1)
SODIUM: 141 mmol/L (ref 135–145)
TCO2: 23 mmol/L (ref 22–32)

## 2018-08-28 LAB — GLUCOSE, CAPILLARY
GLUCOSE-CAPILLARY: 134 mg/dL — AB (ref 70–99)
Glucose-Capillary: 163 mg/dL — ABNORMAL HIGH (ref 70–99)

## 2018-08-28 SURGERY — ABDOMINAL AORTOGRAM W/LOWER EXTREMITY
Anesthesia: LOCAL

## 2018-08-28 MED ORDER — MORPHINE SULFATE (PF) 2 MG/ML IV SOLN: 2.0000 mg | INTRAVENOUS | Status: DC | PRN

## 2018-08-28 MED ORDER — SODIUM CHLORIDE 0.9 % IV SOLN: 250.0000 mL | INTRAVENOUS | Status: DC | PRN

## 2018-08-28 MED ORDER — HEPARIN SODIUM (PORCINE) 1000 UNIT/ML IJ SOLN
INTRAMUSCULAR | Status: AC
  Filled 2018-08-28: qty 1

## 2018-08-28 MED ORDER — MIDAZOLAM HCL 2 MG/2ML IJ SOLN
INTRAMUSCULAR | Status: AC
  Filled 2018-08-28: qty 2

## 2018-08-28 MED ORDER — HYDRALAZINE HCL 20 MG/ML IJ SOLN
INTRAMUSCULAR | Status: AC
  Filled 2018-08-28: qty 1

## 2018-08-28 MED ORDER — SODIUM CHLORIDE 0.9% FLUSH: 3.0000 mL | Freq: Two times a day (BID) | INTRAVENOUS | Status: DC

## 2018-08-28 MED ORDER — HEPARIN SODIUM (PORCINE) 1000 UNIT/ML IJ SOLN
INTRAMUSCULAR | Status: DC | PRN
  Administered 2018-08-28: 9000 [IU] via INTRAVENOUS

## 2018-08-28 MED ORDER — HEPARIN (PORCINE) IN NACL 1000-0.9 UT/500ML-% IV SOLN
INTRAVENOUS | Status: DC | PRN
  Administered 2018-08-28: 500 mL

## 2018-08-28 MED ORDER — CLOPIDOGREL BISULFATE 75 MG PO TABS: 75.0000 mg | ORAL_TABLET | Freq: Every day | ORAL | Status: DC

## 2018-08-28 MED ORDER — ACETAMINOPHEN 325 MG PO TABS: 650.0000 mg | ORAL_TABLET | ORAL | Status: DC | PRN

## 2018-08-28 MED ORDER — FENTANYL CITRATE (PF) 100 MCG/2ML IJ SOLN
INTRAMUSCULAR | Status: AC
  Filled 2018-08-28: qty 2

## 2018-08-28 MED ORDER — OXYCODONE HCL 5 MG PO TABS: 5.0000 mg | ORAL_TABLET | ORAL | Status: DC | PRN

## 2018-08-28 MED ORDER — IOHEXOL 350 MG/ML SOLN: INTRAVENOUS | Status: DC | PRN

## 2018-08-28 MED ORDER — SODIUM CHLORIDE 0.9% FLUSH: 3.0000 mL | INTRAVENOUS | Status: DC | PRN

## 2018-08-28 MED ORDER — ASPIRIN EC 81 MG PO TBEC: 81.0000 mg | DELAYED_RELEASE_TABLET | Freq: Every day | ORAL | Status: DC

## 2018-08-28 MED ORDER — ONDANSETRON HCL 4 MG/2ML IJ SOLN: 4.0000 mg | Freq: Four times a day (QID) | INTRAMUSCULAR | Status: DC | PRN

## 2018-08-28 MED ORDER — SODIUM CHLORIDE 0.9 % WEIGHT BASED INFUSION: 1.0000 mL/kg/h | INTRAVENOUS | Status: DC

## 2018-08-28 MED ORDER — CLOPIDOGREL BISULFATE 75 MG PO TABS: 300.0000 mg | ORAL_TABLET | Freq: Once | ORAL | Status: DC

## 2018-08-28 MED ORDER — CLOPIDOGREL BISULFATE 300 MG PO TABS
ORAL_TABLET | ORAL | Status: AC
  Filled 2018-08-28: qty 1

## 2018-08-28 MED ORDER — HYDRALAZINE HCL 20 MG/ML IJ SOLN: 5.0000 mg | INTRAMUSCULAR | Status: DC | PRN

## 2018-08-28 MED ORDER — SODIUM CHLORIDE 0.9 % IV SOLN
INTRAVENOUS | Status: DC
  Administered 2018-08-28: 08:00:00 via INTRAVENOUS

## 2018-08-28 MED ORDER — LABETALOL HCL 5 MG/ML IV SOLN: 10.0000 mg | INTRAVENOUS | Status: DC | PRN

## 2018-08-28 MED ORDER — LIDOCAINE HCL (PF) 1 % IJ SOLN
INTRAMUSCULAR | Status: DC | PRN
  Administered 2018-08-28: 18 mL

## 2018-08-28 MED ORDER — FENTANYL CITRATE (PF) 100 MCG/2ML IJ SOLN
INTRAMUSCULAR | Status: DC | PRN
  Administered 2018-08-28: 25 ug via INTRAVENOUS
  Administered 2018-08-28: 50 ug via INTRAVENOUS

## 2018-08-28 MED ORDER — MIDAZOLAM HCL 2 MG/2ML IJ SOLN
INTRAMUSCULAR | Status: DC | PRN
  Administered 2018-08-28: 1 mg via INTRAVENOUS
  Administered 2018-08-28: 2 mg via INTRAVENOUS

## 2018-08-28 MED ORDER — HYDRALAZINE HCL 20 MG/ML IJ SOLN
INTRAMUSCULAR | Status: DC | PRN
  Administered 2018-08-28 (×2): 10 mg via INTRAVENOUS

## 2018-08-28 MED ORDER — IODIXANOL 320 MG/ML IV SOLN
INTRAVENOUS | Status: DC | PRN
  Administered 2018-08-28: 200 mL via INTRA_ARTERIAL

## 2018-08-28 MED ORDER — CLOPIDOGREL BISULFATE 75 MG PO TABS: 75.0000 mg | ORAL_TABLET | Freq: Every day | ORAL | 11 refills | Status: DC

## 2018-08-28 SURGICAL SUPPLY — 23 items
BALLN LUTONIX AV 7X40X75 (BALLOONS) ×4
BALLN LUTONIX DCB 6X40X130 (BALLOONS) ×4
BALLOON LUTONIX AV 7X40X75 (BALLOONS) ×1 IMPLANT
BALLOON LUTONIX DCB 6X40X130 (BALLOONS) ×1 IMPLANT
BUR JETSTREAM XC 2.4/3.4 (BURR) ×1 IMPLANT
BURR JETSTREAM XC 2.4/3.4 (BURR) ×4
CATH OMNI FLUSH 5F 65CM (CATHETERS) ×2 IMPLANT
DEVICE CLOSURE MYNXGRIP 6/7F (Vascular Products) ×2 IMPLANT
DRAPE ZERO GRAVITY STERILE (DRAPES) ×2 IMPLANT
KIT ENCORE 26 ADVANTAGE (KITS) ×2 IMPLANT
KIT MICROPUNCTURE NIT STIFF (SHEATH) ×2 IMPLANT
KIT PV (KITS) ×4 IMPLANT
SHEATH PINNACLE 5F 10CM (SHEATH) ×2 IMPLANT
SHEATH PINNACLE 7F 10CM (SHEATH) ×2 IMPLANT
SHEATH PINNACLE ST 7F 45CM (SHEATH) ×2 IMPLANT
SHEATH PROBE COVER 6X72 (BAG) ×2 IMPLANT
SYR MEDRAD MARK V 150ML (SYRINGE) ×4 IMPLANT
TAPE VIPERTRACK RADIOPAQ (MISCELLANEOUS) ×1 IMPLANT
TAPE VIPERTRACK RADIOPAQUE (MISCELLANEOUS) ×4
TRANSDUCER W/STOPCOCK (MISCELLANEOUS) ×4 IMPLANT
TRAY PV CATH (CUSTOM PROCEDURE TRAY) ×4 IMPLANT
WIRE BENTSON .035X145CM (WIRE) ×2 IMPLANT
WIRE SPARTACORE .014X300CM (WIRE) ×2 IMPLANT

## 2018-08-28 NOTE — Interval H&P Note (Signed)
History and Physical Interval Note:  08/28/2018 11:14 AM  Timothy [name]  has presented today for surgery, with the diagnosis of PVD with claudication  The various methods of treatment have been discussed with the patient and family. After consideration of risks, benefits and other options for treatment, the patient has consented to  Procedure(s): LOWER EXTREMITY ANGIOGRAPHY (Bilateral) as a surgical intervention .  The patient's history has been reviewed, patient examined, no change in status, stable for surgery.  I have reviewed the patient's chart and labs.  Questions were answered to the patient's satisfaction.     Annamarie Major

## 2018-08-28 NOTE — Discharge Instructions (Signed)
Femoral Site Care °Refer to this sheet in the next few weeks. These instructions provide you with information about caring for yourself after your procedure. Your health care provider may also give you more specific instructions. Your treatment has been planned according to current medical practices, but problems sometimes occur. Call your health care provider if you have any problems or questions after your procedure. °What can I expect after the procedure? °After your procedure, it is typical to have the following: °· Bruising at the site that usually fades within 1-2 weeks. °· Blood collecting in the tissue (hematoma) that may be painful to the touch. It should usually decrease in size and tenderness within 1-2 weeks. ° °Follow these instructions at home: °· Take medicines only as directed by your health care provider. °· You may shower 24-48 hours after the procedure or as directed by your health care provider. Remove the bandage (dressing) and gently wash the site with plain soap and water. Pat the area dry with a clean towel. Do not rub the site, because this may cause bleeding. °· Do not take baths, swim, or use a hot tub until your health care provider approves. °· Check your insertion site every day for redness, swelling, or drainage. °· Do not apply powder or lotion to the site. °· Limit use of stairs to twice a day for the first 2-3 days or as directed by your health care provider. °· Do not squat for the first 2-3 days or as directed by your health care provider. °· Do not lift over 10 lb (4.5 kg) for 5 days after your procedure or as directed by your health care provider. °· Ask your health care provider when it is okay to: °? Return to work or school. °? Resume usual physical activities or sports. °? Resume sexual activity. °· Do not drive home if you are discharged the same day as the procedure. Have someone else drive you. °· You may drive 24 hours after the procedure unless otherwise instructed by  your health care provider. °· Do not operate machinery or power tools for 24 hours after the procedure or as directed by your health care provider. °· If your procedure was done as an outpatient procedure, which means that you went home the same day as your procedure, a responsible adult should be with you for the first 24 hours after you arrive home. °· Keep all follow-up visits as directed by your health care provider. This is important. °Contact a health care provider if: °· You have a fever. °· You have chills. °· You have increased bleeding from the site. Hold pressure on the site. °Get help right away if: °· You have unusual pain at the site. °· You have redness, warmth, or swelling at the site. °· You have drainage (other than a small amount of blood on the dressing) from the site. °· The site is bleeding, and the bleeding does not stop after 30 minutes of holding steady pressure on the site. °· Your leg or foot becomes pale, cool, tingly, or numb. °This information is not intended to replace advice given to you by your health care provider. Make sure you discuss any questions you have with your health care provider. °Document Released: 07/25/2014 Document Revised: 04/28/2016 Document Reviewed: 06/10/2014 °Elsevier Interactive Patient Education © 2018 [employer] °Moderate Conscious Sedation, Adult, Care After °These instructions provide you with information about caring for yourself after your procedure. Your health care provider may also give you more   specific instructions. Your treatment has been planned according to current medical practices, but problems sometimes occur. Call your health care provider if you have any problems or questions after your procedure. °What can I expect after the procedure? °After your procedure, it is common: °· To feel sleepy for several hours. °· To feel clumsy and have poor balance for several hours. °· To have poor judgment for several hours. °· To vomit if you eat too  soon. ° °Follow these instructions at home: °For at least 24 hours after the procedure: ° °· Do not: °? Participate in activities where you could fall or become injured. °? Drive. °? Use heavy machinery. °? Drink alcohol. °? Take sleeping pills or medicines that cause drowsiness. °? Make important decisions or sign legal documents. °? Take care of children on your own. °· Rest. °Eating and drinking °· Follow the diet recommended by your health care provider. °· If you vomit: °? Drink water, juice, or soup when you can drink without vomiting. °? Make sure you have little or no nausea before eating solid foods. °General instructions °· Have a responsible adult stay with you until you are awake and alert. °· Take over-the-counter and prescription medicines only as told by your health care provider. °· If you smoke, do not smoke without supervision. °· Keep all follow-up visits as told by your health care provider. This is important. °Contact a health care provider if: °· You keep feeling nauseous or you keep vomiting. °· You feel light-headed. °· You develop a rash. °· You have a fever. °Get help right away if: °· You have trouble breathing. °This information is not intended to replace advice given to you by your health care provider. Make sure you discuss any questions you have with your health care provider. °Document Released: 09/11/2013 Document Revised: 04/25/2016 Document Reviewed: 03/12/2016 °Elsevier Interactive Patient Education © 2018 [employer] ° °

## 2018-08-28 NOTE — Op Note (Signed)
Patient name: Timothy [name] MRN: [id number] DOB: 1945/12/09 Sex: male  08/28/2018 Pre-operative Diagnosis: Left leg claudication Post-operative diagnosis:  Same Surgeon:  Annamarie Major Procedure Performed:  1.  Ultrasound-guided access, right femoral artery  2.  Abdominal aortogram  3.  Bilateral lower extremity runoff  4.  Atherectomy, left superficial femoral and popliteal artery  5.  Drug-coated balloon angioplasty, left superficial femoral-popliteal artery  6.  Closure device (Mynx)  7.  Conscious sedation (76 minutes)   Indications: Patient is suffering from lifestyle limiting claudication in his left leg.  He is here today for further evaluation and possible intervention  Procedure:  The patient was identified in the holding area and taken to room 8.  The patient was then placed supine on the table and prepped and draped in the usual sterile fashion.  A time out was called.  Conscious sedation was administered with use of IV fentanyl Versed under continuous physician and nurse monitoring.  Heart rate, blood pressure, and oxygen saturation will continue to monitor.  Ultrasound was used to evaluate the right common femoral artery.  It was patent .  A digital ultrasound image was acquired.  A micropuncture needle was used to access the right common femoral artery under ultrasound guidance.  An 018 wire was advanced without resistance and a micropuncture sheath was placed.  The 018 wire was removed and a benson wire was placed.  The micropuncture sheath was exchanged for a 5 french sheath.  An omniflush catheter was advanced over the wire to the level of L-1.  An abdominal angiogram was obtained.  Next, the catheter was pulled down to the aortic bifurcation and bilateral runoff was performed Findings:   Aortogram: No significant renal artery stenosis.  The infrarenal abdominal aorta is widely patent.  Bilateral common and external iliac arteries are widely patent.  There was luminal  irregularity at the origin of the left common iliac artery however there was no pressure gradient across this area.  Right Lower Extremity: The right common femoral profundofemoral and superficial femoral artery are widely patent.  The popliteal artery is widely patent.  The dominant runoff vessel is the posterior tibial artery.  The peroneal artery does appear to go down to the ankle.  Left Lower Extremity: The left common femoral and profundofemoral artery are widely patent.  The superficial femoral artery has a 90% focal stenosis at the adductor canal.  The popliteal artery has a 80% focal stenosis at the level of the patella.  The dominant runoff is the posterior tibial artery.  Intervention: After the above images were acquired the decision was made to proceed with intervention.  A 7 French 45 cm sheath was advanced over the bifurcation and placed in the left common femoral artery.  The patient was fully heparinized.  I used a 014 [place] core wire to cross both lesions.  My initial plan was to only treat the proximal lesion.  I selected a jetstream device and perform atherectomy making 2 passes with plates down and one class with blades up.  I then used a 7 x 40 drug-coated Lutonix balloon to perform drug-coated balloon angioplasty for 2 minutes.  Follow-up imaging revealed resolution of the stenosis at this area.  When I went to do runoff of the leg, the lesion behind the knee became more apparent, thinking this was about 70-80%.  I then reinserted the jetstream and perform atherectomy of this lesion making 4 passes with plates down and one pass with blades up.  I then used a 6 x 40 Lutonix balloon for drug-coated balloon angioplasty for 2 minutes.  Follow-up imaging revealed resolution of the stenosis.  Runoff was unchanged from preintervention.  At this point catheters and wires were removed.  The 7 French long sheath was exchanged out for a short 7 [place] sheath.  A Mynx device was used for closure.   There were no immediate complications.  Impression:  #1 tandem lesions within the left superficial femoral and popliteal artery.  The proximal was treated with jetstream atherectomy and drug-coated balloon angioplasty using a 6 x 40 balloon.  The more distal lesion at the level of the patella was treated with jetstream atherectomy and a 6 x 40 Lutonix drug-eluting balloon  #2 posterior tibial arteries with dominant runoff vessel bilaterally  #3 no significant aortoiliac stenosis.    Theotis Burrow, M.D. [facility] [place] Office: [phone] Pager:  [phone]

## 2018-08-29 ENCOUNTER — Encounter (HOSPITAL_COMMUNITY): Payer: Self-pay | Admitting: Surgery

## 2018-08-29 ENCOUNTER — Telehealth: Payer: Self-pay | Admitting: Surgery

## 2018-08-29 MED FILL — Clopidogrel Bisulfate Tab 300 MG (Base Equiv): ORAL | Qty: 2 | Status: AC

## 2018-08-29 NOTE — Telephone Encounter (Signed)
sch appt spk to pt mld ltr 09/17/18 12pm ABI 1pm LE Art 115pm f/u NP

## 2018-09-03 ENCOUNTER — Ambulatory Visit (HOSPITAL_COMMUNITY)
Admission: RE | Admit: 2018-09-03 | Discharge: 2018-09-03 | Disposition: A | Discharge status: Home / Self Care | Payer: Medicare HMO | Source: Ambulatory Visit | Attending: Physician Assistant | Admitting: Physician Assistant

## 2018-09-03 ENCOUNTER — Other Ambulatory Visit (HOSPITAL_COMMUNITY): Payer: Self-pay | Admitting: Physician Assistant

## 2018-09-03 ENCOUNTER — Other Ambulatory Visit: Payer: Self-pay

## 2018-09-03 DIAGNOSIS — M25561 Pain in right knee: Secondary | ICD-10-CM

## 2018-09-03 DIAGNOSIS — M79604 Pain in right leg: Secondary | ICD-10-CM

## 2018-09-03 DIAGNOSIS — I70212 Atherosclerosis of native arteries of extremities with intermittent claudication, left leg: Secondary | ICD-10-CM

## 2018-09-03 DIAGNOSIS — M79605 Pain in left leg: Secondary | ICD-10-CM

## 2018-09-03 DIAGNOSIS — Z1389 Encounter for screening for other disorder: Secondary | ICD-10-CM | POA: Diagnosis not present

## 2018-09-03 DIAGNOSIS — Z6831 Body mass index (BMI) 31.0-31.9, adult: Secondary | ICD-10-CM | POA: Diagnosis not present

## 2018-09-03 DIAGNOSIS — E6609 Other obesity due to excess calories: Secondary | ICD-10-CM | POA: Diagnosis not present

## 2018-09-11 ENCOUNTER — Ambulatory Visit (INDEPENDENT_AMBULATORY_CARE_PROVIDER_SITE_OTHER): Payer: Medicare HMO | Admitting: Urology

## 2018-09-11 DIAGNOSIS — C678 Malignant neoplasm of overlapping sites of bladder: Secondary | ICD-10-CM

## 2018-09-11 DIAGNOSIS — N401 Enlarged prostate with lower urinary tract symptoms: Secondary | ICD-10-CM | POA: Diagnosis not present

## 2018-09-11 DIAGNOSIS — C61 Malignant neoplasm of prostate: Secondary | ICD-10-CM | POA: Diagnosis not present

## 2018-09-11 DIAGNOSIS — R351 Nocturia: Secondary | ICD-10-CM | POA: Diagnosis not present

## 2018-09-17 ENCOUNTER — Ambulatory Visit (INDEPENDENT_AMBULATORY_CARE_PROVIDER_SITE_OTHER): Payer: Self-pay | Admitting: Family

## 2018-09-17 ENCOUNTER — Ambulatory Visit (HOSPITAL_COMMUNITY)
Admission: RE | Admit: 2018-09-17 | Discharge: 2018-09-17 | Disposition: A | Discharge status: Home / Self Care | Payer: Medicare HMO | Source: Ambulatory Visit | Attending: Family | Admitting: Family

## 2018-09-17 ENCOUNTER — Ambulatory Visit (INDEPENDENT_AMBULATORY_CARE_PROVIDER_SITE_OTHER)
Admission: RE | Admit: 2018-09-17 | Discharge: 2018-09-17 | Disposition: A | Discharge status: Home / Self Care | Payer: Medicare HMO | Source: Ambulatory Visit | Attending: Family | Admitting: Family

## 2018-09-17 ENCOUNTER — Encounter: Payer: Self-pay | Admitting: Family

## 2018-09-17 VITALS — BP 169/82 | HR 74 | Temp 97.5°F | Resp 16 | Ht 68.0 in | Wt 209.0 lb

## 2018-09-17 DIAGNOSIS — M79604 Pain in right leg: Secondary | ICD-10-CM | POA: Insufficient documentation

## 2018-09-17 DIAGNOSIS — M79605 Pain in left leg: Secondary | ICD-10-CM

## 2018-09-17 DIAGNOSIS — I779 Disorder of arteries and arterioles, unspecified: Secondary | ICD-10-CM

## 2018-09-17 DIAGNOSIS — I70212 Atherosclerosis of native arteries of extremities with intermittent claudication, left leg: Secondary | ICD-10-CM

## 2018-09-17 DIAGNOSIS — Z87891 Personal history of nicotine dependence: Secondary | ICD-10-CM

## 2018-09-17 DIAGNOSIS — Z9862 Peripheral vascular angioplasty status: Secondary | ICD-10-CM

## 2018-09-17 DIAGNOSIS — E1151 Type 2 diabetes mellitus with diabetic peripheral angiopathy without gangrene: Secondary | ICD-10-CM

## 2018-09-17 DIAGNOSIS — N183 Chronic kidney disease, stage 3 unspecified: Secondary | ICD-10-CM

## 2018-09-17 NOTE — Progress Notes (Signed)
Postoperative Visit   History of Present Illness  Timothy [name] is a 72 y.o. male who is s/p atherectomy of left superficial femoral and popliteal artery and drug-coated balloon angioplasty of left superficial femoral-popliteal artery on 08-28-18 by Dr. Trula Slade for lifestyle limiting claudication in his left leg.   He returns to day for ABI's, duplex of left leg arterial system, and evaluation.   He reports low back pain since about age 40, states he has discussed this with his PCP. He has occasisonal pain in his low back with occasional left radiculopathy type symptoms, but only sometimes with walking.   He has no non healing wounds.   The patient is able to complete his activities of daily living.    He states he sees a nephrologist in [place], last serum creatinine and GFR result on file was 1.54 and 51 on 07-30-18, stage 3a CKD.   DM: Yes, A1C was 7.2 on 07-30-18 Tobacco use: quit in 1994, smoked x 30 years   For VQI Use Only  PRE-ADM LIVING: Home  AMB STATUS: Ambulatory   Past Medical History:  Diagnosis Date  . Arthritis   . Bladder cancer ([place])   . CAD (coronary artery disease)    a. 06/2014: NSTEMI s/p PTCA of LCx, 60-80% residual in distal LAD, 70% prox small D1.   b. 02/06/15 NSTEMI  s/p successful PCI/DES to mid RCA  . Diabetes mellitus, type 2 ([place])   . Elevated PSA   . HLD (hyperlipidemia)   . HOH (hard of hearing)   . Hypertension   . NSTEMI (non-ST elevated myocardial infarction) [facility])     Past Surgical History:  Procedure Laterality Date  . ABDOMINAL AORTOGRAM W/LOWER EXTREMITY N/A 08/28/2018   Procedure: ABDOMINAL AORTOGRAM W/LOWER EXTREMITY;  Surgeon: Serafina Mitchell, MD;  Location: [facility] LAB;  Service: Cardiovascular;  Laterality: N/A;  bilateral  . CARDIAC CATHETERIZATION  02/06/2015   Procedure: CORONARY STENT INTERVENTION;  Surgeon: [name], MD;  Location: [facility] CATH LAB;  Service: Cardiovascular;;  Mid RCA  . CARDIAC  CATHETERIZATION N/A 11/11/2016   Procedure: Left Heart Cath and Coronary Angiography;  Surgeon: [name], MD;  Location: [place] [facility] LAB;  Service: Cardiovascular;  Laterality: N/A;  . CARDIAC CATHETERIZATION N/A 11/11/2016   Procedure: Coronary Balloon Angioplasty;  Surgeon: [name], MD;  Location: [place];  Service: Cardiovascular;  Laterality: N/A;  . CORONARY ANGIOPLASTY  11/11/2016   Mid Cx to Dist Cx lesion, 100 %stenosed. This lesion was treated with balloon angioplasty with a 2.0 balloon  . CYSTOSCOPY W/ RETROGRADES N/A 05/05/2015   Procedure: CYSTOSCOPY WITH BILATERAL RETROGRADE PYELOGRAMS AND BLADDER BIOPSIES;  Surgeon: [name], MD;  Location: [facility];  Service: Urology;  Laterality: N/A;  . CYSTOSCOPY W/ RETROGRADES Bilateral 07/31/2018   Procedure: CYSTOSCOPY WITH BILATERAL RETROGRADE PYELOGRAM;  Surgeon: [name], MD;  Location: [facility];  Service: Urology;  Laterality: Bilateral;  . CYSTOSCOPY WITH BIOPSY N/A 07/31/2018   Procedure: CYSTOSCOPY WITH BIOPSY;  Surgeon: [name], MD;  Location: [facility];  Service: Urology;  Laterality: N/A;  . LEFT HEART CATHETERIZATION WITH CORONARY ANGIOGRAM N/A 06/17/2014   Procedure: LEFT HEART CATHETERIZATION WITH CORONARY ANGIOGRAM;  Surgeon: [name], MD;  Location: [facility] CATH LAB;  Service: Cardiovascular;  Laterality: N/A;  . LEFT HEART CATHETERIZATION WITH CORONARY ANGIOGRAM N/A 02/06/2015   Procedure: LEFT HEART CATHETERIZATION WITH CORONARY ANGIOGRAM;  Surgeon: [name], MD;  Location: [facility] CATH  LAB;  Service: Cardiovascular;  Laterality: N/A;  . PERIPHERAL VASCULAR ATHERECTOMY  08/28/2018   Procedure: PERIPHERAL VASCULAR ATHERECTOMY;  Surgeon: Serafina Mitchell, MD;  Location: [facility] LAB;  Service: Cardiovascular;;  Prox lt.SFA, Distal SFA  . PROSTATE BIOPSY N/A 03/31/2014   Procedure: BIOPSY TRANSRECTAL ULTRASONIC PROSTATE (TUBP);  Surgeon: [name], MD;  Location:  [facility];  Service: Urology;  Laterality: N/A;  . TRANSURETHRAL RESECTION OF BLADDER TUMOR N/A 03/14/2013   Procedure: TRANSURETHRAL RESECTION OF BLADDER TUMOR (TURBT);  Surgeon: [name], MD;  Location: [facility];  Service: Urology;  Laterality: N/A;  1 HR WITH MITOMYCIN INSTILLATION   . TRANSURETHRAL RESECTION OF BLADDER TUMOR N/A 09/30/2013   Procedure: TRANSURETHRAL RESECTION OF BLADDER TUMOR (TURBT);  Surgeon: [name], MD;  Location: [facility];  Service: Urology;  Laterality: N/A;  . TRANSURETHRAL RESECTION OF BLADDER TUMOR N/A 03/31/2014   Procedure: TRANSURETHRAL RESECTION OF BLADDER TUMOR (TURBT);  Surgeon: [name], MD;  Location: [facility];  Service: Urology;  Laterality: N/A;  . TRANSURETHRAL RESECTION OF BLADDER TUMOR N/A 05/31/2015   Procedure: Cystoscopy with clott evacuation and fulgeration of bleeders, retrograde pylegram;  Surgeon: [name], MD;  Location: [facility] ORS;  Service: Urology;  Laterality: N/A;    Family History  Problem Relation Age of Onset  . Cancer Child   . Asthma Mother     Social History   Socioeconomic History  . Marital status: Married    Spouse name: Not on file  . Number of children: Not on file  . Years of education: Not on file  . Highest education level: Not on file  Occupational History  . Not on file  Social Needs  . Financial resource strain: Not on file  . Food insecurity:    Worry: Not on file    Inability: Not on file  . Transportation needs:    Medical: Not on file    Non-medical: Not on file  Tobacco Use  . Smoking status: Former Smoker    Packs/day: 1.00    Years: 30.00    Pack years: 30.00    Types: Cigarettes    Last attempt to quit: 03/11/1993    Years since quitting: 25.5  . Smokeless tobacco: Never Used  Substance and Sexual Activity  . Alcohol use: No    Alcohol/week: 0.0 standard drinks  . Drug use: No  . Sexual  activity: Not Currently  Lifestyle  . Physical activity:    Days per week: Not on file    Minutes per session: Not on file  . Stress: Not on file  Relationships  . Social connections:    Talks on phone: Not on file    Gets together: Not on file    Attends religious service: Not on file    Active member of club or organization: Not on file    Attends meetings of clubs or organizations: Not on file    Relationship status: Not on file  . Intimate partner violence:    Fear of current or ex partner: Not on file    Emotionally abused: Not on file    Physically abused: Not on file    Forced sexual activity: Not on file  Other Topics Concern  . Not on file  Social History Narrative  . Not on file    Allergies  Allergen Reactions  . Lipitor [Atorvastatin] Other (See Comments)    Myalgia  . Other Other (See  Comments)    All Cholesterol Meds - causes muscle and joint pain  . Pravastatin Other (See Comments)    Myalgia    Current Outpatient Medications on File Prior to Visit  Medication Sig Dispense Refill  . aspirin 81 MG chewable tablet Chew 1 tablet (81 mg total) by mouth daily.    . cholecalciferol (VITAMIN D) 400 units TABS tablet Take 400 Units by mouth.    . clopidogrel (PLAVIX) 75 MG tablet Take 1 tablet (75 mg total) by mouth daily. 30 tablet 11  . diclofenac (VOLTAREN) 75 MG EC tablet Take 75 mg by mouth 2 (two) times daily as needed (for foot pain).    . hydrALAZINE (APRESOLINE) 25 MG tablet Take 1 tablet (25 mg total) by mouth 3 (three) times daily. 270 tablet 3  . lisinopril (PRINIVIL,ZESTRIL) 20 MG tablet Take 20 mg by mouth daily.    . metFORMIN (GLUCOPHAGE) 500 MG tablet Take 1 tablet (500 mg total) by mouth 2 (two) times daily with a meal. (Patient taking differently: Take 500 mg by mouth daily. ) 60 tablet 11  . metoprolol succinate (TOPROL-XL) 25 MG 24 hr tablet TAKE 3 TABLES TOTAL DAILY (Patient taking differently: Take 25 mg by mouth every 8 (eight) hours. ) 270  tablet 3  . Omega-3 Fatty Acids (FISH OIL) 1000 MG CAPS Take 1,000 mg by mouth once a week.    Vladimir Faster Glycol-Propyl Glycol (SYSTANE OP) Place 1-2 drops into both eyes daily as needed (dry eyes).      No current facility-administered medications on file prior to visit.       Physical Examination  Vitals:   09/17/18 1224  BP: (!) 169/82  Pulse: 74  Resp: 16  Temp: (!) 97.5 F (36.4 C)  TempSrc: Oral  SpO2: 98%  Weight: 209 lb (94.8 kg)  Height: 5\' 8"  (1.727 m)   Body mass index is 31.78 kg/m.  PHYSICAL EXAMINATION: General: Obese male appears his stated age.   HEENT:  No gross abnormalities Pulmonary: Respirations are non-labored Abdomen: Soft and non-tender with normal pitched bowels. Musculoskeletal: There are no major deformities.   Neurologic: No focal weakness or paresthesias are detected, CN 2-12 intact except seems to have some hearing loss Skin: There are no ulcer or rashes noted. Psychiatric: The patient has normal affect. Cardiovascular: There is a regular rate and rhythm without significant murmur appreciated. Femoral puncture site appears normal with no swelling, no ecchymosis.   Vascular: Vessel Right Left  Radial 2+Palpable 2+Palpable  Carotid Palpable, without bruit Palpable, without bruit  Aorta Not palpable N/A  Femoral Not Palpable 3+Palpable  Popliteal Not palpable Not palpable  PT 2+ Palpable 2+ Palpable  DP 1+ Palpable Not  Palpable     DATA  Left LE Arterial Duplex (09-17-18): No stenosis, all tri and biphasic waveforms  ABI (Date: 09/17/2018):  R:   ABI: 1.13 (no previous for comparison),   PT: tri  DP: tri  TBI:  0.67, toe pressure 128  L:   ABI: 1.15 (no previous),   PT: tri  DP: tri  TBI: 0.81, toe pressure 153 Normal bilateral ABI with all triphasic waveforms.     Medical Decision Making  Eoin Willden is a 72 y.o. male who presents s/p atherectomy of left superficial femoral and popliteal artery and  drug-coated balloon angioplasty of left superficial femoral-popliteal artery on 08-28-18. He no longer has claudication with walking. There are no signs of ischemia in his feet or legs.  He does  have occasional low back pain with radiculopathy symptoms.   Based on today's non invasive duplex results, physical exam, and HPI, pt will return in 3 months with ABI's and left LE arterial duplex.  I advised him to walk at least 30 minutes/day total, in a safe environment.  I advised him to notify us if he develops concerns re the circulation in his feet or legs.   I discussed in depth with the patient the nature of atherosclerosis, and emphasized the importance of maximal medical management including strict control of blood pressure, blood glucose, and lipid levels, obtaining regular exercise, and cessation of smoking.  The patient is aware that without maximal medical management the underlying atherosclerotic disease process will progress, limiting the benefit of any interventions. The patient is currently not on a statin: is statin intolerant.  The patient is currently on dual anti-platelet therapy.  Thank you for allowing [place] to participate in this patient's care.  Clemon Chambers, RN, MSN, FNP-C [facility] [place] Office: [phone]  09/17/2018, 12:34 PM  Clinic MD: Oneida Alar on call

## 2018-09-17 NOTE — Patient Instructions (Signed)

## 2018-09-18 DIAGNOSIS — R69 Illness, unspecified: Secondary | ICD-10-CM | POA: Diagnosis not present

## 2018-09-22 DIAGNOSIS — R69 Illness, unspecified: Secondary | ICD-10-CM | POA: Diagnosis not present

## 2018-09-28 ENCOUNTER — Other Ambulatory Visit: Payer: Self-pay

## 2018-09-28 DIAGNOSIS — M79604 Pain in right leg: Secondary | ICD-10-CM

## 2018-09-28 DIAGNOSIS — M79605 Pain in left leg: Secondary | ICD-10-CM

## 2018-09-28 DIAGNOSIS — I70212 Atherosclerosis of native arteries of extremities with intermittent claudication, left leg: Secondary | ICD-10-CM

## 2018-09-28 DIAGNOSIS — I779 Disorder of arteries and arterioles, unspecified: Secondary | ICD-10-CM

## 2018-10-03 DIAGNOSIS — H903 Sensorineural hearing loss, bilateral: Secondary | ICD-10-CM | POA: Diagnosis not present

## 2018-10-15 DIAGNOSIS — M5416 Radiculopathy, lumbar region: Secondary | ICD-10-CM | POA: Diagnosis not present

## 2018-10-15 DIAGNOSIS — E6609 Other obesity due to excess calories: Secondary | ICD-10-CM | POA: Diagnosis not present

## 2018-10-15 DIAGNOSIS — Z6832 Body mass index (BMI) 32.0-32.9, adult: Secondary | ICD-10-CM | POA: Diagnosis not present

## 2018-10-15 DIAGNOSIS — M79604 Pain in right leg: Secondary | ICD-10-CM | POA: Diagnosis not present

## 2018-10-16 ENCOUNTER — Encounter (INDEPENDENT_AMBULATORY_CARE_PROVIDER_SITE_OTHER): Payer: Self-pay | Admitting: *Deleted

## 2018-11-10 IMAGING — DX DG CHEST 2V
2 series · 2 of 2 positions shown · non-contrast
Comparison: 02/06/2015

CLINICAL DATA: Central chest pain, pleuritic.

EXAM:
CHEST  2 VIEW

[chest pa]
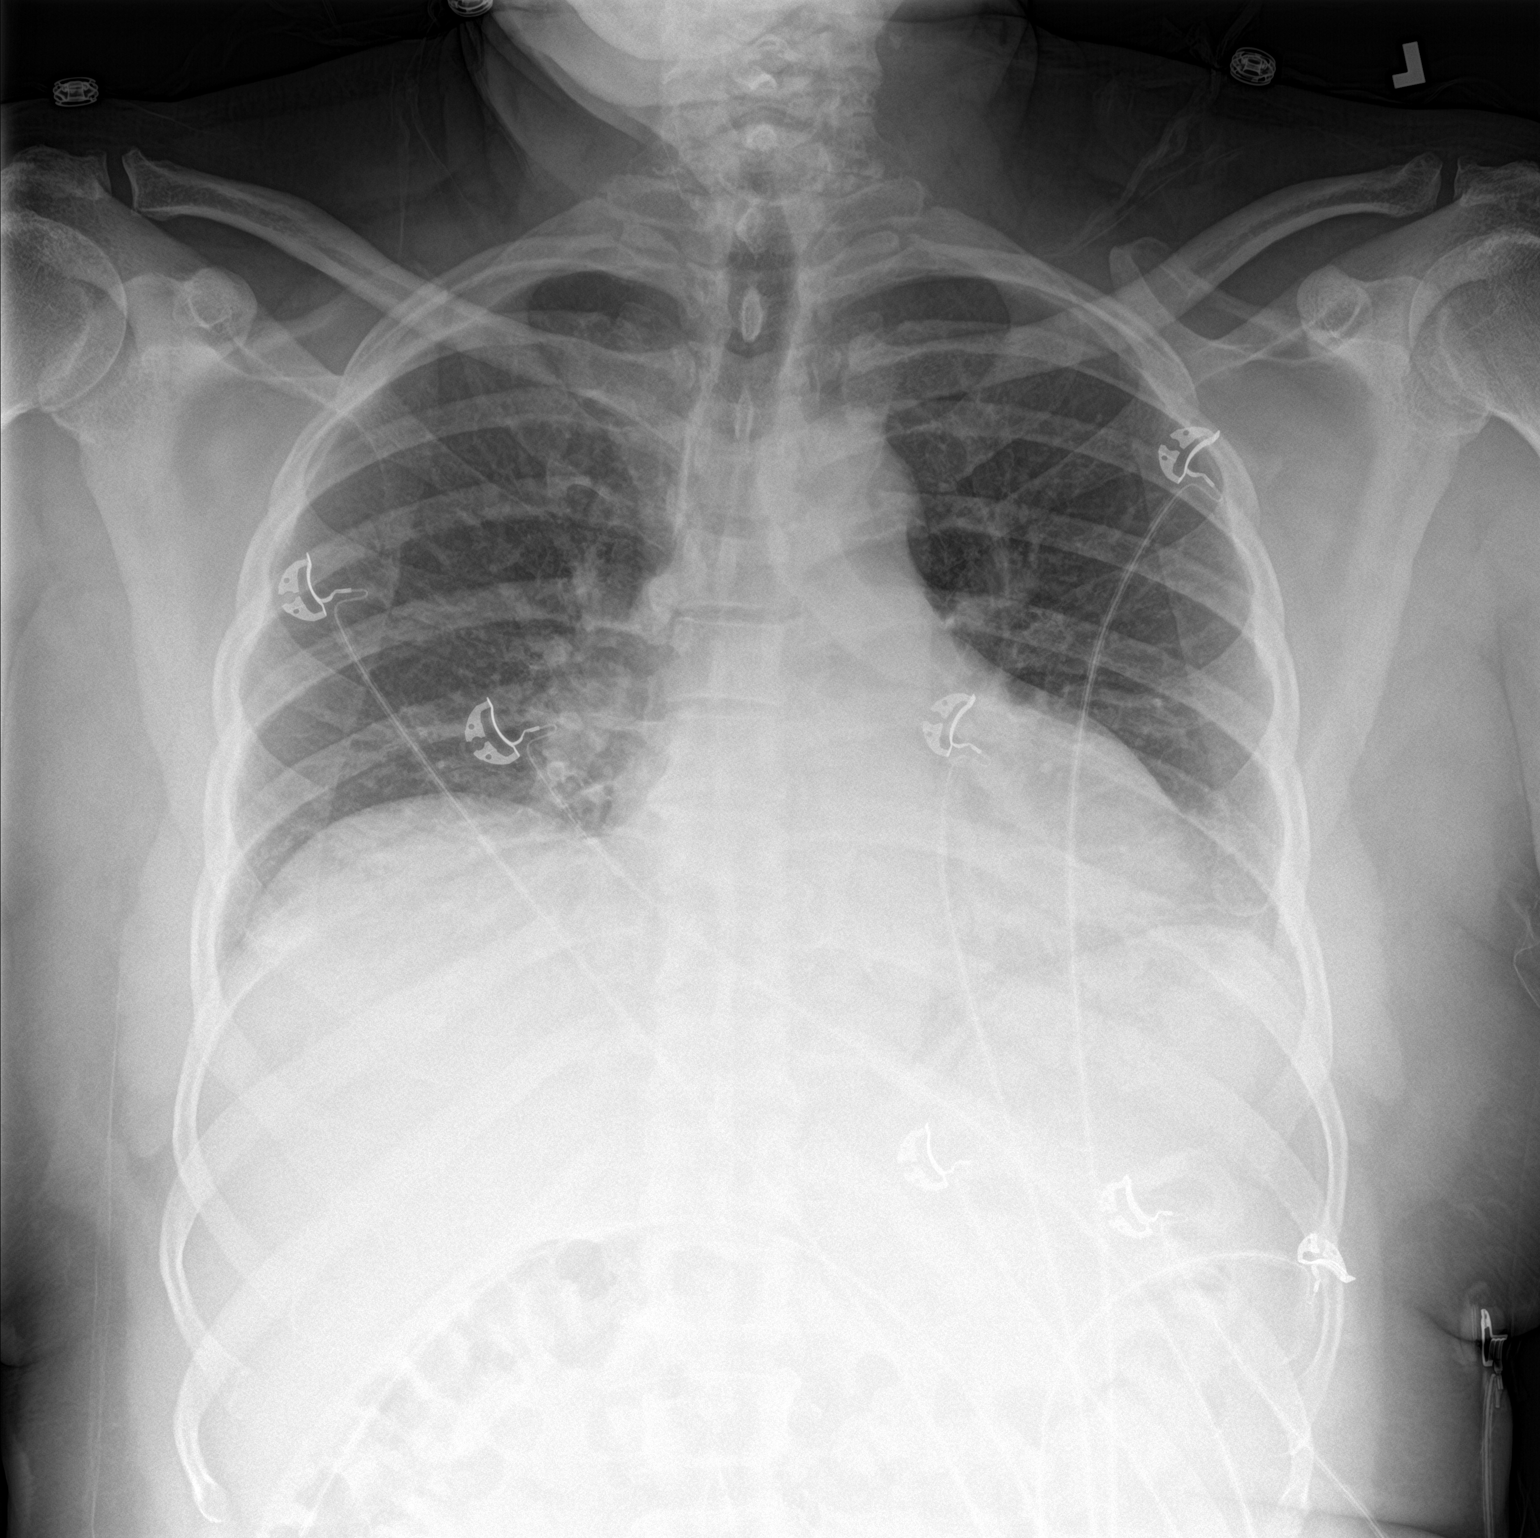

[chest lat]
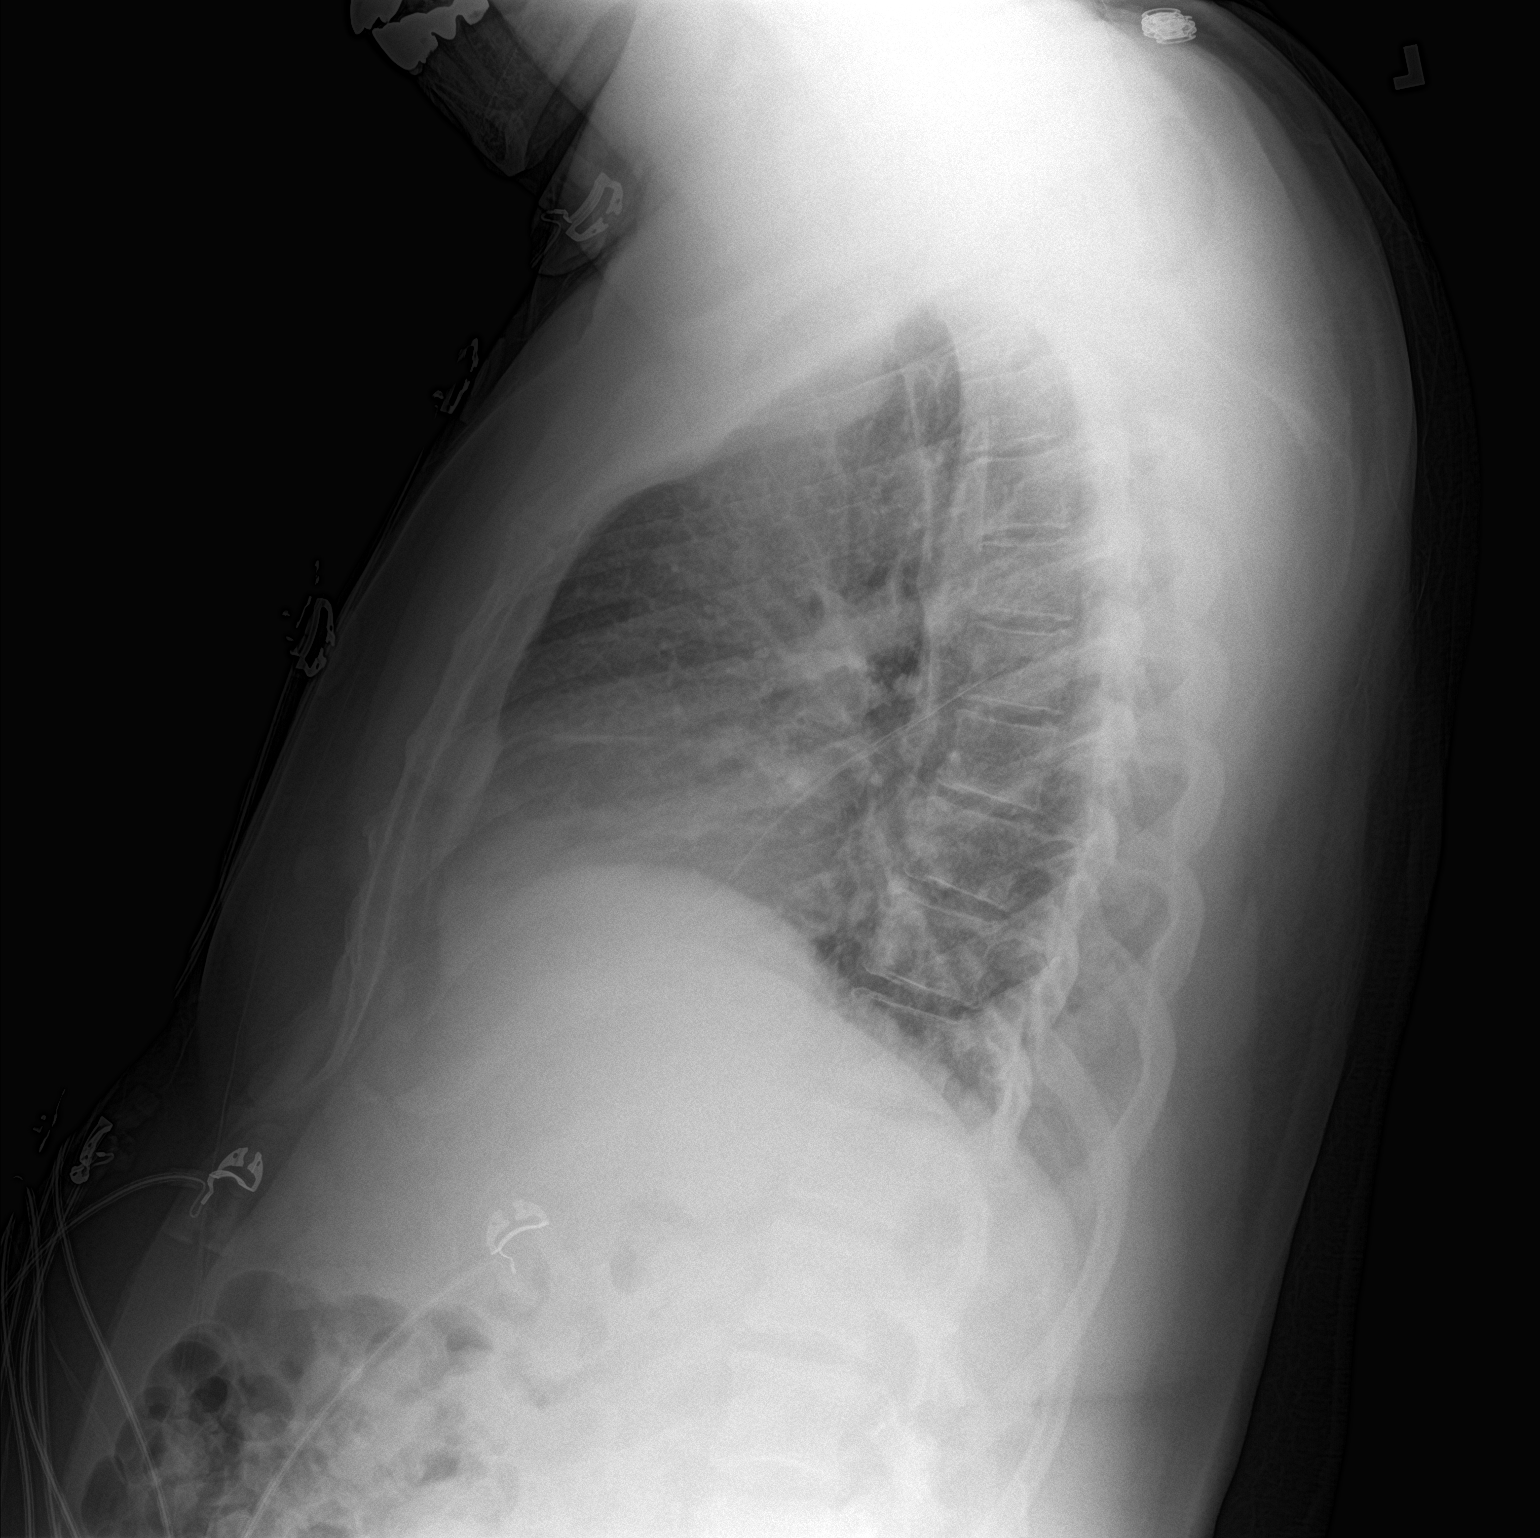

[2 of 2 positions shown; findings below may reference images not displayed]

FINDINGS: Mild linear basilar opacities are accentuated by a shallow degree of
inspiration. No confluent consolidation. No effusion. Normal
pulmonary vasculature. Hilar, mediastinal and cardiac contours are
unremarkable and unchanged.
IMPRESSION: Mild linear atelectatic appearing opacities in the lung bases,
accentuated by a shallow degree of inspiration. No confluent
consolidation. No effusion.

## 2018-11-12 DIAGNOSIS — E1129 Type 2 diabetes mellitus with other diabetic kidney complication: Secondary | ICD-10-CM | POA: Diagnosis not present

## 2018-11-12 DIAGNOSIS — Z6832 Body mass index (BMI) 32.0-32.9, adult: Secondary | ICD-10-CM | POA: Diagnosis not present

## 2018-11-12 DIAGNOSIS — Z1389 Encounter for screening for other disorder: Secondary | ICD-10-CM | POA: Diagnosis not present

## 2018-11-12 DIAGNOSIS — E6609 Other obesity due to excess calories: Secondary | ICD-10-CM | POA: Diagnosis not present

## 2018-11-12 DIAGNOSIS — Z0000 Encounter for general adult medical examination without abnormal findings: Secondary | ICD-10-CM | POA: Diagnosis not present

## 2018-12-12 ENCOUNTER — Encounter (INDEPENDENT_AMBULATORY_CARE_PROVIDER_SITE_OTHER): Payer: Self-pay | Admitting: *Deleted

## 2018-12-12 ENCOUNTER — Other Ambulatory Visit (INDEPENDENT_AMBULATORY_CARE_PROVIDER_SITE_OTHER): Payer: Self-pay | Admitting: *Deleted

## 2018-12-12 ENCOUNTER — Telehealth (INDEPENDENT_AMBULATORY_CARE_PROVIDER_SITE_OTHER): Payer: Self-pay | Admitting: *Deleted

## 2018-12-12 DIAGNOSIS — Z1211 Encounter for screening for malignant neoplasm of colon: Secondary | ICD-10-CM

## 2018-12-12 MED ORDER — PEG 3350-KCL-NA BICARB-NACL 420 G PO SOLR: 4000.0000 mL | Freq: Once | ORAL | 0 refills | Status: AC

## 2018-12-12 NOTE — Telephone Encounter (Signed)
Patient needs trilyte 

## 2018-12-14 ENCOUNTER — Telehealth (INDEPENDENT_AMBULATORY_CARE_PROVIDER_SITE_OTHER): Payer: Self-pay | Admitting: *Deleted

## 2018-12-14 NOTE — Telephone Encounter (Signed)
Referring MD/PCP: fusco   Procedure: tcs  Reason/Indication:  screening  Has patient had this procedure before?  Yes, 2007  If so, when, by whom and where?    Is there a family history of colon cancer?  no  Who?  What age when diagnosed?    Is patient diabetic?   yes      Does patient have prosthetic heart valve or mechanical valve?  no  Do you have a pacemaker?  no  Has patient ever had endocarditis? no  Has patient had joint replacement within last 12 months?  no  Is patient constipated or do they take laxatives? no  Does patient have a history of alcohol/drug use?  no  Is patient on blood thinner such as Coumadin, Plavix and/or Aspirin? yes  Medications: asa 81 mg daily, metformin 500 mg bid, hydralazine 25 mg tid, metoprolol 25 mg tid  Allergies: lipitor, statins, zocor  Medication Adjustment per Dr Lindi Adie, NP: asa 2 days, hold metformin evening before & morning of   Procedure date & time: 01/14/19 at 730

## 2018-12-14 NOTE — Telephone Encounter (Signed)
agree

## 2018-12-20 ENCOUNTER — Encounter: Payer: Self-pay | Admitting: Family

## 2018-12-20 ENCOUNTER — Ambulatory Visit: Payer: Medicare HMO | Admitting: Family

## 2018-12-20 ENCOUNTER — Other Ambulatory Visit: Payer: Self-pay

## 2018-12-20 ENCOUNTER — Ambulatory Visit (INDEPENDENT_AMBULATORY_CARE_PROVIDER_SITE_OTHER)
Admission: RE | Admit: 2018-12-20 | Discharge: 2018-12-20 | Disposition: A | Discharge status: Home / Self Care | Payer: Medicare HMO | Source: Ambulatory Visit | Attending: Family | Admitting: Family

## 2018-12-20 ENCOUNTER — Ambulatory Visit (HOSPITAL_COMMUNITY)
Admission: RE | Admit: 2018-12-20 | Discharge: 2018-12-20 | Disposition: A | Discharge status: Home / Self Care | Payer: Medicare HMO | Source: Ambulatory Visit | Attending: Family | Admitting: Family

## 2018-12-20 VITALS — BP 167/94 | HR 72 | Resp 18 | Ht 68.0 in | Wt 209.0 lb

## 2018-12-20 DIAGNOSIS — I779 Disorder of arteries and arterioles, unspecified: Secondary | ICD-10-CM

## 2018-12-20 DIAGNOSIS — I70212 Atherosclerosis of native arteries of extremities with intermittent claudication, left leg: Secondary | ICD-10-CM | POA: Diagnosis present

## 2018-12-20 DIAGNOSIS — Z87891 Personal history of nicotine dependence: Secondary | ICD-10-CM | POA: Diagnosis not present

## 2018-12-20 DIAGNOSIS — M79604 Pain in right leg: Secondary | ICD-10-CM | POA: Insufficient documentation

## 2018-12-20 DIAGNOSIS — E1151 Type 2 diabetes mellitus with diabetic peripheral angiopathy without gangrene: Secondary | ICD-10-CM

## 2018-12-20 DIAGNOSIS — M79605 Pain in left leg: Secondary | ICD-10-CM

## 2018-12-20 DIAGNOSIS — Z9862 Peripheral vascular angioplasty status: Secondary | ICD-10-CM | POA: Diagnosis not present

## 2018-12-20 NOTE — Progress Notes (Signed)
[facility] [place]   CC: Follow up peripheral artery occlusive disease  History of Present Illness Ata Pecha is a 73 y.o. male who is s/p atherectomy of left superficial femoral and popliteal artery and drug-coated balloon angioplasty of left superficial femoral-popliteal artery on 08-28-18 by Dr. Trula Slade for lifestyle limiting claudication in his left leg.   He returns today for ABI's, duplex of left leg arterial system, and evaluation.   He reports low back pain since about age 73, states he has discussed this with his PCP. He has occasisonal pain in his low back with occasional right radiculopathy type symptoms, but only sometimes with walking.   He has no non healing wounds.  After walking about 50 steps both calves hurt, relieved by rest. Pt states his PCP advised that he get imaging of his lumbar spine, pt states he did not get this done.    He denies any known history of stroke or TIA.  At today's visit he denies chest pain or dyspnea, denies abdominal pain.   He states he sees a nephrologist in [place], last serum creatinine and GFR result on file was 1.54 and 51 on 07-30-18, stage 3a CKD.   He has CAD, had a NSTEMI in 2015, sees a cardiologist.   Diabetic: Yes, last A1C result on file was 7.2 on 07-30-18 Tobacco use: former smoker, quit in 1994, smoked x 30 years  Pt meds include: Statin :No, statins caused myalgias  Betablocker: Yes ASA: Yes Other anticoagulants/antiplatelets: Plavix  Past Medical History:  Diagnosis Date  . Arthritis   . Bladder cancer ([place])   . CAD (coronary artery disease)    a. 06/2014: NSTEMI s/p PTCA of LCx, 60-80% residual in distal LAD, 70% prox small D1.   b. 02/06/15 NSTEMI  s/p successful PCI/DES to mid RCA  . Diabetes mellitus, type 2 ([place])   . Elevated PSA   . HLD (hyperlipidemia)   . HOH (hard of hearing)   . Hypertension   . NSTEMI (non-ST elevated myocardial infarction) [facility])     Social  History Social History   Tobacco Use  . Smoking status: Former Smoker    Packs/day: 1.00    Years: 30.00    Pack years: 30.00    Types: Cigarettes    Last attempt to quit: 03/11/1993    Years since quitting: 25.7  . Smokeless tobacco: Never Used  Substance Use Topics  . Alcohol use: No    Alcohol/week: 0.0 standard drinks  . Drug use: No    Family History Family History  Problem Relation Age of Onset  . Cancer Child   . Asthma Mother     Past Surgical History:  Procedure Laterality Date  . ABDOMINAL AORTOGRAM W/LOWER EXTREMITY N/A 08/28/2018   Procedure: ABDOMINAL AORTOGRAM W/LOWER EXTREMITY;  Surgeon: Serafina Mitchell, MD;  Location: [place] [facility];  Service: Cardiovascular;  Laterality: N/A;  bilateral  . CARDIAC CATHETERIZATION  02/06/2015   Procedure: CORONARY STENT INTERVENTION;  Surgeon: [name], MD;  Location: [facility] CATH LAB;  Service: Cardiovascular;;  Mid RCA  . CARDIAC CATHETERIZATION N/A 11/11/2016   Procedure: Left Heart Cath and Coronary Angiography;  Surgeon: [name], MD;  Location: [place] [facility];  Service: Cardiovascular;  Laterality: N/A;  . CARDIAC CATHETERIZATION N/A 11/11/2016   Procedure: Coronary Balloon Angioplasty;  Surgeon: [name], MD;  Location: [place] [facility];  Service: Cardiovascular;  Laterality: N/A;  . CORONARY ANGIOPLASTY  11/11/2016  Mid Cx to Dist Cx lesion, 100 %stenosed. This lesion was treated with balloon angioplasty with a 2.0 balloon  . CYSTOSCOPY W/ RETROGRADES N/A 05/05/2015   Procedure: CYSTOSCOPY WITH BILATERAL RETROGRADE PYELOGRAMS AND BLADDER BIOPSIES;  Surgeon: [name], MD;  Location: [facility];  Service: Urology;  Laterality: N/A;  . CYSTOSCOPY W/ RETROGRADES Bilateral 07/31/2018   Procedure: CYSTOSCOPY WITH BILATERAL RETROGRADE PYELOGRAM;  Surgeon: [name], MD;  Location: [facility];  Service: Urology;  Laterality: Bilateral;  . CYSTOSCOPY WITH BIOPSY N/A 07/31/2018   Procedure:  CYSTOSCOPY WITH BIOPSY;  Surgeon: [name], MD;  Location: [facility];  Service: Urology;  Laterality: N/A;  . LEFT HEART CATHETERIZATION WITH CORONARY ANGIOGRAM N/A 06/17/2014   Procedure: LEFT HEART CATHETERIZATION WITH CORONARY ANGIOGRAM;  Surgeon: [name], MD;  Location: [facility] CATH LAB;  Service: Cardiovascular;  Laterality: N/A;  . LEFT HEART CATHETERIZATION WITH CORONARY ANGIOGRAM N/A 02/06/2015   Procedure: LEFT HEART CATHETERIZATION WITH CORONARY ANGIOGRAM;  Surgeon: [name], MD;  Location: [facility] CATH LAB;  Service: Cardiovascular;  Laterality: N/A;  . PERIPHERAL VASCULAR ATHERECTOMY  08/28/2018   Procedure: PERIPHERAL VASCULAR ATHERECTOMY;  Surgeon: Serafina Mitchell, MD;  Location: [place] [facility];  Service: Cardiovascular;;  Prox lt.SFA, Distal SFA  . PROSTATE BIOPSY N/A 03/31/2014   Procedure: BIOPSY TRANSRECTAL ULTRASONIC PROSTATE (TUBP);  Surgeon: [name], MD;  Location: [facility];  Service: Urology;  Laterality: N/A;  . TRANSURETHRAL RESECTION OF BLADDER TUMOR N/A 03/14/2013   Procedure: TRANSURETHRAL RESECTION OF BLADDER TUMOR (TURBT);  Surgeon: [name], MD;  Location: [facility];  Service: Urology;  Laterality: N/A;  1 HR WITH MITOMYCIN INSTILLATION   . TRANSURETHRAL RESECTION OF BLADDER TUMOR N/A 09/30/2013   Procedure: TRANSURETHRAL RESECTION OF BLADDER TUMOR (TURBT);  Surgeon: [name], MD;  Location: [facility];  Service: Urology;  Laterality: N/A;  . TRANSURETHRAL RESECTION OF BLADDER TUMOR N/A 03/31/2014   Procedure: TRANSURETHRAL RESECTION OF BLADDER TUMOR (TURBT);  Surgeon: [name], MD;  Location: [facility];  Service: Urology;  Laterality: N/A;  . TRANSURETHRAL RESECTION OF BLADDER TUMOR N/A 05/31/2015   Procedure: Cystoscopy with clott evacuation and fulgeration of bleeders, retrograde pylegram;  Surgeon: [name], MD;  Location: [facility];  Service:  Urology;  Laterality: N/A;    Allergies  Allergen Reactions  . Lipitor [Atorvastatin] Other (See Comments)    Myalgia  . Other Other (See Comments)    All Cholesterol Meds - causes muscle and joint pain  . Pravastatin Other (See Comments)    Myalgia    Current Outpatient Medications  Medication Sig Dispense Refill  . aspirin 81 MG chewable tablet Chew 1 tablet (81 mg total) by mouth daily.    . cholecalciferol (VITAMIN D) 400 units TABS tablet Take 400 Units by mouth.    . clopidogrel (PLAVIX) 75 MG tablet Take 1 tablet (75 mg total) by mouth daily. 30 tablet 11  . diclofenac (VOLTAREN) 75 MG EC tablet Take 75 mg by mouth 2 (two) times daily as needed (for foot pain).    Marland Kitchen lisinopril (PRINIVIL,ZESTRIL) 20 MG tablet Take 20 mg by mouth daily.    . metFORMIN (GLUCOPHAGE) 500 MG tablet Take 1 tablet (500 mg total) by mouth 2 (two) times daily with a meal. (Patient taking differently: Take 500 mg by mouth daily. ) 60 tablet 11  . metoprolol succinate (TOPROL-XL) 25 MG 24 hr tablet TAKE 3 TABLES TOTAL DAILY (Patient taking differently: Take  25 mg by mouth every 8 (eight) hours. ) 270 tablet 3  . Omega-3 Fatty Acids (FISH OIL) 1000 MG CAPS Take 1,000 mg by mouth once a week.    Vladimir Faster Glycol-Propyl Glycol (SYSTANE OP) Place 1-2 drops into both eyes daily as needed (dry eyes).     . hydrALAZINE (APRESOLINE) 25 MG tablet Take 1 tablet (25 mg total) by mouth 3 (three) times daily. 270 tablet 3   No current facility-administered medications for this visit.     ROS: See HPI for pertinent positives and negatives.   Physical Examination  Vitals:   12/20/18 1521  BP: (!) 167/94  Pulse: 72  Resp: 18  SpO2: 96%  Weight: 209 lb (94.8 kg)  Height: 5\' 8"  (1.727 m)   Body mass index is 31.78 kg/m.  General: A&O x 3, WDWN, obese male. Gait: normal HENT: No gross abnormalities.  Eyes: PERRLA. Pulmonary: Respirations are non labored, CTAB, good air movement in all fields Cardiac:  regular rhythm, no detected murmur.         Carotid Bruits Right Left   Negative Negative   Radial pulses are 2+ palpable bilaterally   Adominal aortic pulse is not palpable                         VASCULAR EXAM: Extremities without ischemic changes, without Gangrene; without open wounds.                                                                                                          LE Pulses Right Left       FEMORAL  1+ palpable  2+ palpable        POPLITEAL  not palpable   not palpable       POSTERIOR TIBIAL  2+ palpable    palpable        DORSALIS PEDIS      ANTERIOR TIBIAL  palpable  not palpable    Abdomen: soft, NT, no palpable masses. Skin: no rashes, no cellulitis, no ulcers noted. Musculoskeletal: no muscle wasting or atrophy.  Neurologic: A&O X 3; appropriate affect, Sensation is normal; MOTOR FUNCTION:  moving all extremities equally, motor strength 5/5 throughout. Speech is fluent/normal. CN 2-12 intact except seems to have some hearing loss. Psychiatric: Thought content is normal, mood appropriate for clinical situation.    ASSESSMENT: Quantel Mcinturff is a 73 y.o. male who is s/p atherectomy of left superficial femoral and popliteal artery and drug-coated balloon angioplasty of left superficial femoral-popliteal artery on 08-28-18.  He has pain in both calves after walking about 50 steps; however, this is not from lack of arterial perfusion since his ABI's are normal. He alludes to some history of lumbar spine issues, states his PCP ordered some imaging of his lumbar spine, but he did not get this done. He indicated that he did not want back surgery. There are other modalities to address lumbar spine issues other than surgery. I encouraged him to speak with his PCP re this.  There  are no signs of ischemia in his feet or legs.   His atherosclerotic risk factors include almost in control DM, former smoker (x 30 years), CAD, obesity, statin intolerance,  and hypertension.  He takes a daily ASA and Plavix.    DATA  Left LE Arterial Duplex (12-20-18): Left Duplex Findings: +-----------+--------+-----+--------+---------+--------+            PSV cm/sRatioStenosisWaveform Comments +-----------+--------+-----+--------+---------+--------+ CFA Distal 118                  triphasic         +-----------+--------+-----+--------+---------+--------+ DFA        90                   triphasic         +-----------+--------+-----+--------+---------+--------+ SFA Prox   59                   biphasic          +-----------+--------+-----+--------+---------+--------+ SFA Mid    95                   biphasic          +-----------+--------+-----+--------+---------+--------+ SFA Distal 96                   biphasic          +-----------+--------+-----+--------+---------+--------+ POP Prox   73                   biphasic          +-----------+--------+-----+--------+---------+--------+ POP Distal 63                   biphasic          +-----------+--------+-----+--------+---------+--------+ ATA Distal 28                   biphasic          +-----------+--------+-----+--------+---------+--------+ PTA Distal 92                   biphasic          +-----------+--------+-----+--------+---------+--------+ PERO Distal24                   biphasic          +-----------+--------+-----+--------+---------+--------+ Right: No significant change compared to previous study. Patent left lower extremity arterial system. No evidence of stenosis.   ABI (Date: 12/20/2018):  R:   ABI: 1.14 (was 1.13 on 09-17-18),   PT: tri  DP: mono  TBI:  0.79, toe pressure 143(was 0.67)  L:   ABI: 1.1 (was 1.15),   PT: tri  DP: tri  TBI: 0.66, toe pressure 119(was 0.81) Bilateral ABI remain normal.    PLAN:  Based on the patient's vascular studies and examination, pt will return to clinic  in 6 months with left LE arterial duplex and ABI's.   I advised him to walk at least 30 minute daily in total, in a safe environment I also advised him to notify us if he develops concerns re the circulation in his feet or legs.   I discussed in depth with the patient the nature of atherosclerosis, and emphasized the importance of maximal medical management including strict control of blood pressure, blood glucose, and lipid levels, obtaining regular exercise, and continued cessation of smoking.  The patient is aware that without maximal medical management the underlying atherosclerotic disease process will progress, limiting the benefit of  any interventions.  The patient was given information about PAD including signs, symptoms, treatment, what symptoms should prompt the patient to seek immediate medical care, and risk reduction measures to take.  Clemon Chambers, RN, MSN, FNP-C [facility] Phone: [phone]  Clinic MD: Scot Dock in vein clinic, Donzetta Matters on call  12/20/18 3:47 PM

## 2018-12-20 NOTE — Patient Instructions (Signed)
Peripheral Vascular Disease  Peripheral vascular disease (PVD) is a disease of the blood vessels that are not part of your heart and brain. A simple term for PVD is poor circulation. In most cases, PVD narrows the blood vessels that carry blood from your heart to the rest of your body. This can reduce the supply of blood to your arms, legs, and internal organs, like your stomach or kidneys. However, PVD most often affects a person's lower legs and feet. Without treatment, PVD tends to get worse. PVD can also lead to acute ischemic limb. This is when an arm or leg suddenly cannot get enough blood. This is a medical emergency. Follow these instructions at home: Lifestyle  Do not use any products that contain nicotine or tobacco, such as cigarettes and e-cigarettes. If you need help quitting, ask your doctor.  Lose weight if you are overweight. Or, stay at a healthy weight as told by your doctor.  Eat a diet that is low in fat and cholesterol. If you need help, ask your doctor.  Exercise regularly. Ask your doctor for activities that are right for you. General instructions  Take over-the-counter and prescription medicines only as told by your doctor.  Take good care of your feet: ? Wear comfortable shoes that fit well. ? Check your feet often for any cuts or sores.  Keep all follow-up visits as told by your doctor This is important. Contact a doctor if:  You have cramps in your legs when you walk.  You have leg pain when you are at rest.  You have coldness in a leg or foot.  Your skin changes.  You are unable to get or have an erection (erectile dysfunction).  You have cuts or sores on your feet that do not heal. Get help right away if:  Your arm or leg turns cold, numb, and blue.  Your arms or legs become red, warm, swollen, painful, or numb.  You have chest pain.  You have trouble breathing.  You suddenly have weakness in your face, arm, or leg.  You become very  confused or you cannot speak.  You suddenly have a very bad headache.  You suddenly cannot see. Summary  Peripheral vascular disease (PVD) is a disease of the blood vessels.  A simple term for PVD is poor circulation. Without treatment, PVD tends to get worse.  Treatment may include exercise, low fat and low cholesterol diet, and quitting smoking. This information is not intended to replace advice given to you by your health care provider. Make sure you discuss any questions you have with your health care provider. Document Released: 02/15/2010 Document Revised: 12/29/2016 Document Reviewed: 12/29/2016 Elsevier Interactive Patient Education  2019 Elsevier Inc.  

## 2018-12-23 DIAGNOSIS — R69 Illness, unspecified: Secondary | ICD-10-CM | POA: Diagnosis not present

## 2019-01-01 DIAGNOSIS — C61 Malignant neoplasm of prostate: Secondary | ICD-10-CM | POA: Diagnosis not present

## 2019-01-14 ENCOUNTER — Encounter (HOSPITAL_COMMUNITY): Payer: Self-pay | Admitting: *Deleted

## 2019-01-14 ENCOUNTER — Encounter (HOSPITAL_COMMUNITY)
Admission: RE | Disposition: A | Discharge status: Home / Self Care | Payer: Self-pay | Source: Home / Self Care | Attending: Internal Medicine

## 2019-01-14 ENCOUNTER — Other Ambulatory Visit: Payer: Self-pay

## 2019-01-14 ENCOUNTER — Ambulatory Visit (HOSPITAL_COMMUNITY)
Admission: RE | Admit: 2019-01-14 | Discharge: 2019-01-14 | Disposition: A | Discharge status: Home / Self Care | Payer: Medicare HMO | Attending: Internal Medicine | Admitting: Internal Medicine

## 2019-01-14 DIAGNOSIS — D120 Benign neoplasm of cecum: Secondary | ICD-10-CM

## 2019-01-14 DIAGNOSIS — Z87891 Personal history of nicotine dependence: Secondary | ICD-10-CM | POA: Insufficient documentation

## 2019-01-14 DIAGNOSIS — Z8551 Personal history of malignant neoplasm of bladder: Secondary | ICD-10-CM | POA: Diagnosis not present

## 2019-01-14 DIAGNOSIS — Z1211 Encounter for screening for malignant neoplasm of colon: Secondary | ICD-10-CM

## 2019-01-14 DIAGNOSIS — E785 Hyperlipidemia, unspecified: Secondary | ICD-10-CM | POA: Diagnosis not present

## 2019-01-14 DIAGNOSIS — K644 Residual hemorrhoidal skin tags: Secondary | ICD-10-CM

## 2019-01-14 DIAGNOSIS — Z825 Family history of asthma and other chronic lower respiratory diseases: Secondary | ICD-10-CM | POA: Insufficient documentation

## 2019-01-14 DIAGNOSIS — K5730 Diverticulosis of large intestine without perforation or abscess without bleeding: Secondary | ICD-10-CM

## 2019-01-14 DIAGNOSIS — Z79899 Other long term (current) drug therapy: Secondary | ICD-10-CM | POA: Diagnosis not present

## 2019-01-14 DIAGNOSIS — Z955 Presence of coronary angioplasty implant and graft: Secondary | ICD-10-CM | POA: Insufficient documentation

## 2019-01-14 DIAGNOSIS — H9190 Unspecified hearing loss, unspecified ear: Secondary | ICD-10-CM | POA: Insufficient documentation

## 2019-01-14 DIAGNOSIS — Z809 Family history of malignant neoplasm, unspecified: Secondary | ICD-10-CM | POA: Insufficient documentation

## 2019-01-14 DIAGNOSIS — Z7982 Long term (current) use of aspirin: Secondary | ICD-10-CM | POA: Diagnosis not present

## 2019-01-14 DIAGNOSIS — K6289 Other specified diseases of anus and rectum: Secondary | ICD-10-CM

## 2019-01-14 DIAGNOSIS — Z7984 Long term (current) use of oral hypoglycemic drugs: Secondary | ICD-10-CM | POA: Diagnosis not present

## 2019-01-14 DIAGNOSIS — M1990 Unspecified osteoarthritis, unspecified site: Secondary | ICD-10-CM | POA: Insufficient documentation

## 2019-01-14 DIAGNOSIS — D122 Benign neoplasm of ascending colon: Secondary | ICD-10-CM

## 2019-01-14 DIAGNOSIS — I10 Essential (primary) hypertension: Secondary | ICD-10-CM | POA: Insufficient documentation

## 2019-01-14 DIAGNOSIS — I2510 Atherosclerotic heart disease of native coronary artery without angina pectoris: Secondary | ICD-10-CM | POA: Insufficient documentation

## 2019-01-14 DIAGNOSIS — I252 Old myocardial infarction: Secondary | ICD-10-CM | POA: Diagnosis not present

## 2019-01-14 DIAGNOSIS — E119 Type 2 diabetes mellitus without complications: Secondary | ICD-10-CM | POA: Diagnosis not present

## 2019-01-14 HISTORY — PX: COLONOSCOPY: SHX5424

## 2019-01-14 HISTORY — PX: POLYPECTOMY: SHX5525

## 2019-01-14 LAB — GLUCOSE, CAPILLARY: Glucose-Capillary: 168 mg/dL — ABNORMAL HIGH (ref 70–99)

## 2019-01-14 SURGERY — COLONOSCOPY
Anesthesia: Moderate Sedation

## 2019-01-14 MED ORDER — MIDAZOLAM HCL 5 MG/5ML IJ SOLN
INTRAMUSCULAR | Status: AC
  Filled 2019-01-14: qty 10

## 2019-01-14 MED ORDER — MIDAZOLAM HCL 5 MG/5ML IJ SOLN
INTRAMUSCULAR | Status: DC | PRN
  Administered 2019-01-14: 2 mg via INTRAVENOUS
  Administered 2019-01-14: 1 mg via INTRAVENOUS

## 2019-01-14 MED ORDER — MEPERIDINE HCL 50 MG/ML IJ SOLN
INTRAMUSCULAR | Status: AC
  Filled 2019-01-14: qty 1

## 2019-01-14 MED ORDER — SODIUM CHLORIDE 0.9 % IV SOLN
INTRAVENOUS | Status: DC
  Administered 2019-01-14: 07:00:00 via INTRAVENOUS

## 2019-01-14 MED ORDER — EPINEPHRINE PF 1 MG/10ML IJ SOSY
PREFILLED_SYRINGE | INTRAMUSCULAR | Status: AC
  Filled 2019-01-14: qty 10

## 2019-01-14 MED ORDER — MEPERIDINE HCL 50 MG/ML IJ SOLN
INTRAMUSCULAR | Status: DC | PRN
  Administered 2019-01-14: 25 mg

## 2019-01-14 MED ORDER — ASPIRIN 81 MG PO CHEW: 81.0000 mg | CHEWABLE_TABLET | Freq: Every day | ORAL | Status: DC

## 2019-01-14 MED ORDER — FLUMAZENIL 0.5 MG/5ML IV SOLN
INTRAVENOUS | Status: AC
  Filled 2019-01-14: qty 5

## 2019-01-14 MED ORDER — NALOXONE HCL 0.4 MG/ML IJ SOLN
INTRAMUSCULAR | Status: AC
  Filled 2019-01-14: qty 1

## 2019-01-14 MED ORDER — STERILE WATER FOR IRRIGATION IR SOLN
Status: DC | PRN
  Administered 2019-01-14: 07:00:00

## 2019-01-14 MED ORDER — ATROPINE SULFATE 1 MG/ML IJ SOLN
INTRAMUSCULAR | Status: AC
  Filled 2019-01-14: qty 1

## 2019-01-14 NOTE — Op Note (Addendum)
[facility] Patient Name: Timothy [name] Procedure Date: 01/14/2019 7:10 AM MRN: [id number] Date of Birth: 1946/06/23 Attending MD: Hildred Laser , MD CSN: [id number] Age: 73 Admit Type: Outpatient Procedure:                Colonoscopy Indications:              Screening for colorectal malignant neoplasm Providers:                Hildred Laser, MD, Gerome Sam, RN, Charlsie Quest.                            Theda Sers RN, RN Referring MD:             Redmond School, MD Medicines:                Meperidine 25 mg IV, Midazolam 3 mg IV Complications:            No immediate complications. Estimated Blood Loss:     Estimated blood loss was minimal. Procedure:                Pre-Anesthesia Assessment:                           - Prior to the procedure, a History and Physical                            was performed, and patient medications and                            allergies were reviewed. The patient's tolerance of                            previous anesthesia was also reviewed. The risks                            and benefits of the procedure and the sedation                            options and risks were discussed with the patient.                            All questions were answered, and informed consent                            was obtained. Prior Anticoagulants: The patient                            last took aspirin 2 days prior to the procedure.                            ASA Grade Assessment: III - A patient with severe                            systemic disease. After reviewing the risks and  benefits, the patient was deemed in satisfactory                            condition to undergo the procedure.                           After obtaining informed consent, the colonoscope                            was passed under direct vision. Throughout the                            procedure, the patient's blood pressure, pulse, and                     oxygen saturations were monitored continuously. The                            [id number]-[id number] ([id number]) scope was introduced through                            the anus and advanced to the the cecum, identified                            by appendiceal orifice and ileocecal valve. The                            colonoscopy was performed without difficulty. The                            patient tolerated the procedure well. The quality                            of the bowel preparation was excellent. The                            ileocecal valve, appendiceal orifice, and rectum                            were photographed. Scope In: 7:44:11 AM Scope Out: 8:05:19 AM Scope Withdrawal Time: 0 hours 15 minutes 36 seconds  Total Procedure Duration: 0 hours 21 minutes 8 seconds  Findings:      The perianal and digital rectal examinations were normal.      Two sessile polyps were found in the ascending colon and cecum. The       polyps were 5 mm in size. These polyps were removed with a cold snare.       Resection and retrieval were complete. The pathology specimen was placed       into Bottle Number 1.      Two sessile polyps were found in the cecum. The polyps were small in       size. These were biopsied with a cold forceps for histology. The       pathology specimen was placed into Bottle Number 1.      A single small-mouthed diverticulum was found in the hepatic flexure.  Multiple small and large-mouthed diverticula were found in the sigmoid       colon.      External hemorrhoids were found during retroflexion. The hemorrhoids       were small.      Anal papilla was hypertrophied. Impression:               - Two 5 mm polyps in the ascending colon and in the                            cecum, removed with a cold snare. Resected and                            retrieved.                           - Two small polyps in the cecum. Biopsied.                           -  Diverticulosis at the hepatic flexure.                           - Diverticulosis in the sigmoid colon.                           - External hemorrhoids.                           - Single small anal papilla. Moderate Sedation:      Moderate (conscious) sedation was administered by the endoscopy nurse       and supervised by the endoscopist. The following parameters were       monitored: oxygen saturation, heart rate, blood pressure, CO2       capnography and response to care. Total physician intraservice time was       25 minutes. Recommendation:           - Patient has a contact number available for                            emergencies. The signs and symptoms of potential                            delayed complications were discussed with the                            patient. Return to normal activities tomorrow.                            Written discharge instructions were provided to the                            patient.                           - High fiber diet and diabetic (ADA) diet today.                           -  Continue present medications.                           - Resume aspirin tomorrow at prior doses.                           - Await pathology results.                           - Repeat colonoscopy date to be determined after                            pending pathology results are reviewed. Procedure Code(s):        --- Professional ---                           [phone], Colonoscopy, flexible; with removal of                            tumor(s), polyp(s), or other lesion(s) by snare                            technique                           45380, 59, Colonoscopy, flexible; with biopsy,                            single or multiple                           99153, Moderate sedation; each additional 15                            minutes intraservice time                           G0500, Moderate sedation services provided by the                             same physician or other qualified health care                            professional performing a gastrointestinal                            endoscopic service that sedation supports,                            requiring the presence of an independent trained                            observer to assist in the monitoring of the                            patient's level of consciousness and physiological  status; initial 15 minutes of intra-service time;                            patient age 4 years or older (additional time may                            be reported with [phone], as appropriate) Diagnosis Code(s):        --- Professional ---                           Z12.11, Encounter for screening for malignant                            neoplasm of colon                           D12.2, Benign neoplasm of ascending colon                           D12.0, Benign neoplasm of cecum                           K64.4, Residual hemorrhoidal skin tags                           K62.89, Other specified diseases of anus and rectum                           K57.30, Diverticulosis of large intestine without                            perforation or abscess without bleeding CPT copyright 2018 American Medical Association. All rights reserved. The codes documented in this report are preliminary and upon coder review may  be revised to meet current compliance requirements. Hildred Laser, MD Hildred Laser, MD 01/14/2019 8:17:18 AM This report has been signed electronically. Number of Addenda: 0

## 2019-01-14 NOTE — Discharge Instructions (Signed)
Colonoscopy, Adult, Care After This sheet gives you information about how to care for yourself after your procedure. Your health care provider may also give you more specific instructions. If you have problems or questions, contact your health care provider. What can I expect after the procedure? After the procedure, it is common to have:  A small amount of blood in your stool for 24 hours after the procedure.  Some gas.  Mild abdominal cramping or bloating. Follow these instructions at home: General instructions  For the first 24 hours after the procedure: ? Do not drive or use machinery. ? Do not sign important documents. ? Do not drink alcohol. ? Do your regular daily activities at a slower pace than normal. ? Eat soft, easy-to-digest foods.  Take over-the-counter or prescription medicines only as told by your health care provider. Relieving cramping and bloating   Try walking around when you have cramps or feel bloated.  Apply heat to your abdomen as told by your health care provider. Use a heat source that your health care provider recommends, such as a moist heat pack or a heating pad. ? Place a towel between your skin and the heat source. ? Leave the heat on for 20-30 minutes. ? Remove the heat if your skin turns bright red. This is especially important if you are unable to feel pain, heat, or cold. You may have a greater risk of getting burned. Eating and drinking   Drink enough fluid to keep your urine pale yellow.  Resume your normal diet as instructed by your health care provider. Avoid heavy or fried foods that are hard to digest.  Avoid drinking alcohol for as long as instructed by your health care provider. Contact a health care provider if:  You have blood in your stool 2-3 days after the procedure. Get help right away if:  You have more than a small spotting of blood in your stool.  You pass large blood clots in your stool.  Your abdomen is  swollen.  You have nausea or vomiting.  You have a fever.  You have increasing abdominal pain that is not relieved with medicine. Summary  After the procedure, it is common to have a small amount of blood in your stool. You may also have mild abdominal cramping and bloating.  For the first 24 hours after the procedure, do not drive or use machinery, sign important documents, or drink alcohol.  Contact your health care provider if you have a lot of blood in your stool, nausea or vomiting, a fever, or increased abdominal pain. This information is not intended to replace advice given to you by your health care provider. Make sure you discuss any questions you have with your health care provider. Document Released: 07/05/2004 Document Revised: 09/13/2017 Document Reviewed: 02/02/2016 Elsevier Interactive Patient Education  2019 [employer]   Colon Polyps  Polyps are tissue growths inside the body. Polyps can grow in many places, including the large intestine (colon). A polyp may be a round bump or a mushroom-shaped growth. You could have one polyp or several. Most colon polyps are noncancerous (benign). However, some colon polyps can become cancerous over time. Finding and removing the polyps early can help prevent this. What are the causes? The exact cause of colon polyps is not known. What increases the risk? You are more likely to develop this condition if you:  Have a family history of colon cancer or colon polyps.  Are older than 50 or older  than 45 if you are African American.  Have inflammatory bowel disease, such as ulcerative colitis or Crohn's disease.  Have certain hereditary conditions, such as: ? Familial adenomatous polyposis. ? Lynch syndrome. ? Turcot syndrome. ? Peutz-Jeghers syndrome.  Are overweight.  Smoke cigarettes.  Do not get enough exercise.  Drink too much alcohol.  Eat a diet that is high in fat and red meat and low in fiber.  Had childhood  cancer that was treated with abdominal radiation. What are the signs or symptoms? Most polyps do not cause symptoms. If you have symptoms, they may include:  Blood coming from your rectum when having a bowel movement.  Blood in your stool. The stool may look dark red or black.  Abdominal pain.  A change in bowel habits, such as constipation or diarrhea. How is this diagnosed? This condition is diagnosed with a colonoscopy. This is a procedure in which a lighted, flexible scope is inserted into the anus and then passed into the colon to examine the area. Polyps are sometimes found when a colonoscopy is done as part of routine cancer screening tests. How is this treated? Treatment for this condition involves removing any polyps that are found. Most polyps can be removed during a colonoscopy. Those polyps will then be tested for cancer. Additional treatment may be needed depending on the results of testing. Follow these instructions at home: Lifestyle  Maintain a healthy weight, or lose weight if recommended by your health care provider.  Exercise every day or as told by your health care provider.  Do not use any products that contain nicotine or tobacco, such as cigarettes and e-cigarettes. If you need help quitting, ask your health care provider.  If you drink alcohol, limit how much you have: ? 0-1 drink a day for women. ? 0-2 drinks a day for men.  Be aware of how much alcohol is in your drink. In the [place] one drink equals one 12 oz bottle of beer (355 mL), one 5 oz glass of wine (148 mL), or one 1 oz shot of hard liquor (44 mL). Eating and drinking   Eat foods that are high in fiber, such as fruits, vegetables, and whole grains.  Eat foods that are high in calcium and vitamin D, such as milk, cheese, yogurt, eggs, liver, fish, and broccoli.  Limit foods that are high in fat, such as fried foods and desserts.  Limit the amount of red meat and processed meat you eat, such as  hot dogs, sausage, bacon, and lunch meats. General instructions  Keep all follow-up visits as told by your health care provider. This is important. ? This includes having regularly scheduled colonoscopies. ? Talk to your health care provider about when you need a colonoscopy. Contact a health care provider if:  You have new or worsening bleeding during a bowel movement.  You have new or increased blood in your stool.  You have a change in bowel habits.  You lose weight for no known reason. Summary  Polyps are tissue growths inside the body. Polyps can grow in many places, including the colon.  Most colon polyps are noncancerous (benign), but some can become cancerous over time.  This condition is diagnosed with a colonoscopy.  Treatment for this condition involves removing any polyps that are found. Most polyps can be removed during a colonoscopy. This information is not intended to replace advice given to you by your health care provider. Make sure you discuss any questions you have  with your health care provider. Document Released: 08/17/2004 Document Revised: 03/08/2018 Document Reviewed: 03/08/2018 Elsevier Interactive Patient Education  2019 [employer].  Diverticulosis  Diverticulosis is a condition that develops when small pouches (diverticula) form in the wall of the large intestine (colon). The colon is where water is absorbed and stool is formed. The pouches form when the inside layer of the colon pushes through weak spots in the outer layers of the colon. You may have a few pouches or many of them. What are the causes? The cause of this condition is not known. What increases the risk? The following factors may make you more likely to develop this condition:  Being older than age 52. Your risk for this condition increases with age. Diverticulosis is rare among people younger than age 30. By age 92, many people have it.  Eating a low-fiber diet.  Having frequent  constipation.  Being overweight.  Not getting enough exercise.  Smoking.  Taking over-the-counter pain medicines, like aspirin and ibuprofen.  Having a family history of diverticulosis. What are the signs or symptoms? In most people, there are no symptoms of this condition. If you do have symptoms, they may include:  Bloating.  Cramps in the abdomen.  Constipation or diarrhea.  Pain in the lower left side of the abdomen. How is this diagnosed? This condition is most often diagnosed during an exam for other colon problems. Because diverticulosis usually has no symptoms, it often cannot be diagnosed independently. This condition may be diagnosed by:  Using a flexible scope to examine the colon (colonoscopy).  Taking an X-ray of the colon after dye has been put into the colon (barium enema).  Doing a CT scan. How is this treated? You may not need treatment for this condition if you have never developed an infection related to diverticulosis. If you have had an infection before, treatment may include:  Eating a high-fiber diet. This may include eating more fruits, vegetables, and grains.  Taking a fiber supplement.  Taking a live bacteria supplement (probiotic).  Taking medicine to relax your colon.  Taking antibiotic medicines. Follow these instructions at home:  Drink 6-8 glasses of water or more each day to prevent constipation.  Try not to strain when you have a bowel movement.  If you have had an infection before: ? Eat more fiber as directed by your health care provider or your diet and nutrition specialist (dietitian). ? Take a fiber supplement or probiotic, if your health care provider approves.  Take over-the-counter and prescription medicines only as told by your health care provider.  If you were prescribed an antibiotic, take it as told by your health care provider. Do not stop taking the antibiotic even if you start to feel better.  Keep all follow-up  visits as told by your health care provider. This is important. Contact a health care provider if:  You have pain in your abdomen.  You have bloating.  You have cramps.  You have not had a bowel movement in 3 days. Get help right away if:  Your pain gets worse.  Your bloating becomes very bad.  You have a fever or chills, and your symptoms suddenly get worse.  You vomit.  You have bowel movements that are bloody or black.  You have bleeding from your rectum. Summary  Diverticulosis is a condition that develops when small pouches (diverticula) form in the wall of the large intestine (colon).  You may have a few pouches or many of  them.  This condition is most often diagnosed during an exam for other colon problems.  If you have had an infection related to diverticulosis, treatment may include increasing the fiber in your diet, taking supplements, or taking medicines. This information is not intended to replace advice given to you by your health care provider. Make sure you discuss any questions you have with your health care provider. Document Released: 08/18/2004 Document Revised: 10/10/2016 Document Reviewed: 10/10/2016 Elsevier Interactive Patient Education  2019 [place]. Resume aspirin and clopidogrel on 01/15/2019. Resume other medications as before. Modified carb high-fiber diet. No driving for 24 hours.   Physician will call with biopsy results.

## 2019-01-14 NOTE — H&P (Addendum)
Timothy [name] is an 73 y.o. male.   Chief Complaint: Patient is here for colonoscopy. HPI: Patient is 73 year old Afro-American male who is here for screening colonoscopy.  Last exam was in July 2007 revealing sigmoid colon diverticulosis.  He denies change in bowel habits rectal bleeding or melena.  He has intermittent lower abdominal pain.  He wonders if he is having side effects during 1 of his medications. Last aspirin dose was 2 days ago. Family history is negative for CRC.  Past Medical History:  Diagnosis Date  . Arthritis   . Bladder cancer ([place])   . CAD (coronary artery disease)    a. 06/2014: NSTEMI s/p PTCA of LCx, 60-80% residual in distal LAD, 70% prox small D1.   b. 02/06/15 NSTEMI  s/p successful PCI/DES to mid RCA  . Diabetes mellitus, type 2 ([place])   . Elevated PSA   . HLD (hyperlipidemia)   . HOH (hard of hearing)   . Hypertension   .      Past Surgical History:  Procedure Laterality Date  . ABDOMINAL AORTOGRAM W/LOWER EXTREMITY N/A 08/28/2018   Procedure: ABDOMINAL AORTOGRAM W/LOWER EXTREMITY;  Surgeon: Serafina Mitchell, MD;  Location: [facility];  Service: Cardiovascular;  Laterality: N/A;  bilateral  . CARDIAC CATHETERIZATION  02/06/2015   Procedure: CORONARY STENT INTERVENTION;  Surgeon: [name], MD;  Location: [facility];  Service: Cardiovascular;;  Mid RCA  . CARDIAC CATHETERIZATION N/A 11/11/2016   Procedure: Left Heart Cath and Coronary Angiography;  Surgeon: [name], MD;  Location: [facility];  Service: Cardiovascular;  Laterality: N/A;  . CARDIAC CATHETERIZATION N/A 11/11/2016   Procedure: Coronary Balloon Angioplasty;  Surgeon: [name], MD;  Location: [facility];  Service: Cardiovascular;  Laterality: N/A;  . CORONARY ANGIOPLASTY  11/11/2016   Mid Cx to Dist Cx lesion, 100 %stenosed. This lesion was treated with balloon angioplasty with a 2.0 balloon  . CYSTOSCOPY W/ RETROGRADES N/A 05/05/2015   Procedure: CYSTOSCOPY WITH BILATERAL RETROGRADE PYELOGRAMS AND BLADDER BIOPSIES;  Surgeon: [name], MD;  Location: [facility];  Service: Urology;  Laterality: N/A;  . CYSTOSCOPY W/ RETROGRADES Bilateral 07/31/2018   Procedure: CYSTOSCOPY WITH BILATERAL RETROGRADE PYELOGRAM;  Surgeon: [name], MD;  Location: [facility];  Service: Urology;  Laterality: Bilateral;  . CYSTOSCOPY WITH BIOPSY N/A 07/31/2018   Procedure: CYSTOSCOPY WITH BIOPSY;  Surgeon: [name], MD;  Location: [facility];  Service: Urology;  Laterality: N/A;  . LEFT HEART CATHETERIZATION WITH CORONARY ANGIOGRAM N/A 06/17/2014   Procedure: LEFT HEART CATHETERIZATION WITH CORONARY ANGIOGRAM;  Surgeon: [name], MD;  Location: [facility];  Service: Cardiovascular;  Laterality: N/A;  . LEFT HEART CATHETERIZATION WITH CORONARY ANGIOGRAM N/A 02/06/2015   Procedure: LEFT HEART CATHETERIZATION WITH CORONARY ANGIOGRAM;  Surgeon: [name], MD;  Location: [facility];  Service: Cardiovascular;  Laterality: N/A;  . PERIPHERAL VASCULAR ATHERECTOMY  08/28/2018   Procedure: PERIPHERAL VASCULAR ATHERECTOMY;  Surgeon: Serafina Mitchell, MD;  Location: [place];  Service: Cardiovascular;;  Prox lt.SFA, Distal SFA  . PROSTATE BIOPSY N/A 03/31/2014   Procedure: BIOPSY TRANSRECTAL ULTRASONIC PROSTATE (TUBP);  Surgeon: [name], MD;  Location: [facility];  Service: Urology;  Laterality: N/A;  . TRANSURETHRAL RESECTION OF BLADDER TUMOR N/A 03/14/2013   Procedure: TRANSURETHRAL RESECTION OF BLADDER TUMOR (TURBT);  Surgeon: [name], MD;  Location: [facility];  Service: Urology;  Laterality: N/A;  1 HR  WITH MITOMYCIN INSTILLATION   . TRANSURETHRAL RESECTION OF BLADDER TUMOR N/A 09/30/2013   Procedure: TRANSURETHRAL RESECTION OF BLADDER TUMOR (TURBT);  Surgeon: [name], MD;  Location: [facility];  Service: Urology;  Laterality: N/A;  . TRANSURETHRAL  RESECTION OF BLADDER TUMOR N/A 03/31/2014   Procedure: TRANSURETHRAL RESECTION OF BLADDER TUMOR (TURBT);  Surgeon: [name], MD;  Location: [facility];  Service: Urology;  Laterality: N/A;  . TRANSURETHRAL RESECTION OF BLADDER TUMOR N/A 05/31/2015   Procedure: Cystoscopy with clott evacuation and fulgeration of bleeders, retrograde pylegram;  Surgeon: [name], MD;  Location: [facility];  Service: Urology;  Laterality: N/A;    Family History  Problem Relation Age of Onset  . Cancer Child   . Asthma Mother    Social History:  reports that he quit smoking about 25 years ago. His smoking use included cigarettes. He has a 30.00 pack-year smoking history. He has never used smokeless tobacco. He reports that he does not drink alcohol or use drugs.  Allergies:  Allergies  Allergen Reactions  . Lipitor [Atorvastatin] Other (See Comments)    Myalgia  . Other Other (See Comments)    All Cholesterol Meds - causes muscle and joint pain  . Pravastatin Other (See Comments)    Myalgia    Medications Prior to Admission  Medication Sig Dispense Refill  . aspirin 81 MG chewable tablet Chew 1 tablet (81 mg total) by mouth daily.    . Cholecalciferol 25 MCG (1000 UT) tablet Take 1,000 Units by mouth every other day.     . hydrALAZINE (APRESOLINE) 25 MG tablet Take 25 mg by mouth 3 (three) times daily.    . metFORMIN (GLUCOPHAGE) 500 MG tablet Take 1 tablet (500 mg total) by mouth 2 (two) times daily with a meal. 60 tablet 11  . metoprolol succinate (TOPROL-XL) 25 MG 24 hr tablet TAKE 3 TABLES TOTAL DAILY (Patient taking differently: Take 25 mg by mouth every 8 (eight) hours. ) 270 tablet 3  . Polyethyl Glycol-Propyl Glycol (SYSTANE OP) Place 1-2 drops into both eyes daily as needed (dry eyes).     . clopidogrel (PLAVIX) 75 MG tablet Take 1 tablet (75 mg total) by mouth daily. (Patient not taking: Reported on 01/04/2019) 30 tablet 11    Results for orders placed or performed  during the hospital encounter of 01/14/19 (from the past 48 hour(s))  Glucose, capillary     Status: Abnormal   Collection Time: 01/14/19  7:01 AM  Result Value Ref Range   Glucose-Capillary 168 (H) 70 - 99 mg/dL   No results found.  ROS  Blood pressure (!) 173/95, pulse 73, temperature (!) 97.5 F (36.4 C), temperature source Oral, resp. rate 16, height 5\' 8"  (1.727 m), weight 95.3 kg, SpO2 96 %. Physical Exam  Constitutional: He appears well-developed and well-nourished.  HENT:  Mouth/Throat: Oropharynx is clear and moist.  Eyes: Conjunctivae are normal. No scleral icterus.  Neck: No thyromegaly present.  Cardiovascular: Regular rhythm and normal heart sounds.  No murmur heard. Respiratory: Effort normal and breath sounds normal.  GI:  Abdomen is full.  Small umbilical hernia noted.  It is completely reducible.  Abdomen is soft and nontender with organomegaly or masses.  Musculoskeletal:        General: No edema.  Neurological: He is alert.  Skin: Skin is warm and dry.     Assessment/Plan Average risk screening colonoscopy.  Hildred Laser, MD 01/14/2019, 7:31 AM

## 2019-01-15 ENCOUNTER — Ambulatory Visit: Payer: Medicare HMO | Admitting: Urology

## 2019-01-15 ENCOUNTER — Other Ambulatory Visit (HOSPITAL_COMMUNITY)
Admission: RE | Admit: 2019-01-15 | Discharge: 2019-01-15 | Disposition: A | Discharge status: Home / Self Care | Payer: Medicare HMO | Source: Ambulatory Visit | Attending: Urology | Admitting: Urology

## 2019-01-15 ENCOUNTER — Other Ambulatory Visit (HOSPITAL_COMMUNITY)
Admission: RE | Admit: 2019-01-15 | Discharge: 2019-01-15 | Disposition: A | Discharge status: Home / Self Care | Payer: Medicare HMO | Source: Ambulatory Visit | Attending: *Deleted | Admitting: *Deleted

## 2019-01-15 DIAGNOSIS — C678 Malignant neoplasm of overlapping sites of bladder: Secondary | ICD-10-CM | POA: Diagnosis not present

## 2019-01-15 DIAGNOSIS — C61 Malignant neoplasm of prostate: Secondary | ICD-10-CM | POA: Diagnosis not present

## 2019-01-15 DIAGNOSIS — N401 Enlarged prostate with lower urinary tract symptoms: Secondary | ICD-10-CM | POA: Diagnosis not present

## 2019-01-15 DIAGNOSIS — R82998 Other abnormal findings in urine: Secondary | ICD-10-CM | POA: Diagnosis not present

## 2019-01-21 ENCOUNTER — Encounter (HOSPITAL_COMMUNITY): Payer: Self-pay | Admitting: Internal Medicine

## 2019-02-06 DIAGNOSIS — R109 Unspecified abdominal pain: Secondary | ICD-10-CM | POA: Diagnosis not present

## 2019-02-06 DIAGNOSIS — E6609 Other obesity due to excess calories: Secondary | ICD-10-CM | POA: Diagnosis not present

## 2019-02-06 DIAGNOSIS — M79606 Pain in leg, unspecified: Secondary | ICD-10-CM | POA: Diagnosis not present

## 2019-02-06 DIAGNOSIS — M545 Low back pain: Secondary | ICD-10-CM | POA: Diagnosis not present

## 2019-02-06 DIAGNOSIS — E1165 Type 2 diabetes mellitus with hyperglycemia: Secondary | ICD-10-CM | POA: Diagnosis not present

## 2019-02-06 DIAGNOSIS — R195 Other fecal abnormalities: Secondary | ICD-10-CM | POA: Diagnosis not present

## 2019-02-06 DIAGNOSIS — Z6833 Body mass index (BMI) 33.0-33.9, adult: Secondary | ICD-10-CM | POA: Diagnosis not present

## 2019-03-04 ENCOUNTER — Ambulatory Visit: Payer: Medicare HMO | Admitting: Cardiology

## 2019-03-22 DIAGNOSIS — R69 Illness, unspecified: Secondary | ICD-10-CM | POA: Diagnosis not present

## 2019-04-27 ENCOUNTER — Other Ambulatory Visit: Payer: Self-pay | Admitting: Cardiology

## 2019-05-10 ENCOUNTER — Ambulatory Visit: Payer: Medicare HMO | Admitting: Cardiology

## 2019-06-13 DIAGNOSIS — Z6834 Body mass index (BMI) 34.0-34.9, adult: Secondary | ICD-10-CM | POA: Diagnosis not present

## 2019-06-13 DIAGNOSIS — I10 Essential (primary) hypertension: Secondary | ICD-10-CM | POA: Diagnosis not present

## 2019-06-13 DIAGNOSIS — E7849 Other hyperlipidemia: Secondary | ICD-10-CM | POA: Diagnosis not present

## 2019-06-13 DIAGNOSIS — Z1389 Encounter for screening for other disorder: Secondary | ICD-10-CM | POA: Diagnosis not present

## 2019-06-13 DIAGNOSIS — E6609 Other obesity due to excess calories: Secondary | ICD-10-CM | POA: Diagnosis not present

## 2019-06-13 DIAGNOSIS — E1129 Type 2 diabetes mellitus with other diabetic kidney complication: Secondary | ICD-10-CM | POA: Diagnosis not present

## 2019-06-14 ENCOUNTER — Other Ambulatory Visit: Payer: Self-pay

## 2019-06-14 DIAGNOSIS — Z6834 Body mass index (BMI) 34.0-34.9, adult: Secondary | ICD-10-CM | POA: Diagnosis not present

## 2019-06-14 DIAGNOSIS — E7849 Other hyperlipidemia: Secondary | ICD-10-CM | POA: Diagnosis not present

## 2019-06-14 DIAGNOSIS — E1129 Type 2 diabetes mellitus with other diabetic kidney complication: Secondary | ICD-10-CM | POA: Diagnosis not present

## 2019-06-14 DIAGNOSIS — I779 Disorder of arteries and arterioles, unspecified: Secondary | ICD-10-CM

## 2019-06-14 DIAGNOSIS — E6609 Other obesity due to excess calories: Secondary | ICD-10-CM | POA: Diagnosis not present

## 2019-06-14 DIAGNOSIS — Z1389 Encounter for screening for other disorder: Secondary | ICD-10-CM | POA: Diagnosis not present

## 2019-06-18 DIAGNOSIS — R69 Illness, unspecified: Secondary | ICD-10-CM | POA: Diagnosis not present

## 2019-06-19 ENCOUNTER — Telehealth (HOSPITAL_COMMUNITY): Payer: Self-pay | Admitting: Rehabilitation

## 2019-06-19 NOTE — Telephone Encounter (Signed)

## 2019-06-20 ENCOUNTER — Ambulatory Visit (INDEPENDENT_AMBULATORY_CARE_PROVIDER_SITE_OTHER)
Admission: RE | Admit: 2019-06-20 | Discharge: 2019-06-20 | Disposition: A | Discharge status: Home / Self Care | Payer: Medicare HMO | Source: Ambulatory Visit | Attending: Family | Admitting: Family

## 2019-06-20 ENCOUNTER — Ambulatory Visit (INDEPENDENT_AMBULATORY_CARE_PROVIDER_SITE_OTHER): Payer: Medicare HMO | Admitting: Family

## 2019-06-20 ENCOUNTER — Other Ambulatory Visit: Payer: Self-pay

## 2019-06-20 ENCOUNTER — Ambulatory Visit (HOSPITAL_COMMUNITY)
Admission: RE | Admit: 2019-06-20 | Discharge: 2019-06-20 | Disposition: A | Discharge status: Home / Self Care | Payer: Medicare HMO | Source: Ambulatory Visit | Attending: Family | Admitting: Family

## 2019-06-20 ENCOUNTER — Encounter: Payer: Self-pay | Admitting: Family

## 2019-06-20 ENCOUNTER — Encounter: Payer: Self-pay | Admitting: *Deleted

## 2019-06-20 VITALS — BP 134/84 | Temp 97.7°F | Resp 12 | Ht 68.0 in | Wt 216.0 lb

## 2019-06-20 DIAGNOSIS — Z9862 Peripheral vascular angioplasty status: Secondary | ICD-10-CM | POA: Diagnosis not present

## 2019-06-20 DIAGNOSIS — E1151 Type 2 diabetes mellitus with diabetic peripheral angiopathy without gangrene: Secondary | ICD-10-CM

## 2019-06-20 DIAGNOSIS — Z87891 Personal history of nicotine dependence: Secondary | ICD-10-CM

## 2019-06-20 DIAGNOSIS — I779 Disorder of arteries and arterioles, unspecified: Secondary | ICD-10-CM | POA: Insufficient documentation

## 2019-06-20 NOTE — H&P (View-Only) (Signed)
[facility] [place]   CC: Follow up peripheral artery occlusive disease  History of Present Illness Timothy [name] is a 73 y.o. male who is s/p atherectomyofleft superficial femoral and popliteal arteryand drug-coated balloon angioplastyofleft superficial femoral-popliteal artery on 08-28-18 by Dr. Trula Slade forlifestyle limiting claudication in his left leg.   He returns today for ABI's,duplex of left leg arterial system, and evaluation.   He reports low back pain since about age 24, states he has discussed this with his PCP. He has occasisonal pain in his low back with occasional right radiculopathy type symptoms, but only sometimes with walking. He has no non healing wounds. After walking about 50 steps both calves hurt, relieved by rest. Pt states his PCP advised that he get imaging of his lumbar spine, pt states he did not get this done.    He denies any known history of stroke or TIA.  At today's visit he denies chest pain or dyspnea, denies abdominal pain.   He states he sees a nephrologist in [place], last serum creatinine and GFR result on file was 1.54 and 51 on 07-30-18, stage 3a CKD.   He has CAD, had a NSTEMI in 2015, sees a cardiologist.   Diabetic: Yes, last A1C result on file was 7.2 on 07-30-18, his DM meds were changed recently, he states his last A1C was 6.? Tobacco use: former smoker, quit in 1994, smoked x 30 years  Pt meds include: Statin :No, statins caused myalgias  Betablocker: Yes ASA: Yes Other anticoagulants/antiplatelets: Plavix   Past Medical History:  Diagnosis Date  . Arthritis   . Bladder cancer ([place])   . CAD (coronary artery disease)    a. 06/2014: NSTEMI s/p PTCA of LCx, 60-80% residual in distal LAD, 70% prox small D1.   b. 02/06/15 NSTEMI  s/p successful PCI/DES to mid RCA  . Diabetes mellitus, type 2 ([place])   . Elevated PSA   . HLD (hyperlipidemia)   . HOH (hard of hearing)   . Hypertension   .  NSTEMI (non-ST elevated myocardial infarction) [facility])     Social History Social History   Tobacco Use  . Smoking status: Former Smoker    Packs/day: 1.00    Years: 30.00    Pack years: 30.00    Types: Cigarettes    Quit date: 03/11/1993    Years since quitting: 26.2  . Smokeless tobacco: Never Used  Substance Use Topics  . Alcohol use: No    Alcohol/week: 0.0 standard drinks  . Drug use: No    Family History Family History  Problem Relation Age of Onset  . Cancer Child   . Asthma Mother     Past Surgical History:  Procedure Laterality Date  . ABDOMINAL AORTOGRAM W/LOWER EXTREMITY N/A 08/28/2018   Procedure: ABDOMINAL AORTOGRAM W/LOWER EXTREMITY;  Surgeon: Serafina Mitchell, MD;  Location: [place] [facility];  Service: Cardiovascular;  Laterality: N/A;  bilateral  . CARDIAC CATHETERIZATION  02/06/2015   Procedure: CORONARY STENT INTERVENTION;  Surgeon: [name], MD;  Location: [facility] CATH LAB;  Service: Cardiovascular;;  Mid RCA  . CARDIAC CATHETERIZATION N/A 11/11/2016   Procedure: Left Heart Cath and Coronary Angiography;  Surgeon: [name], MD;  Location: [place] [facility];  Service: Cardiovascular;  Laterality: N/A;  . CARDIAC CATHETERIZATION N/A 11/11/2016   Procedure: Coronary Balloon Angioplasty;  Surgeon: [name], MD;  Location: [place] [facility];  Service: Cardiovascular;  Laterality: N/A;  . COLONOSCOPY N/A 01/14/2019  Procedure: COLONOSCOPY;  Surgeon: Rogene Houston, MD;  Location: [facility];  Service: Endoscopy;  Laterality: N/A;  730  . CORONARY ANGIOPLASTY  11/11/2016   Mid Cx to Dist Cx lesion, 100 %stenosed. This lesion was treated with balloon angioplasty with a 2.0 balloon  . CYSTOSCOPY W/ RETROGRADES N/A 05/05/2015   Procedure: CYSTOSCOPY WITH BILATERAL RETROGRADE PYELOGRAMS AND BLADDER BIOPSIES;  Surgeon: [name], MD;  Location: [facility];  Service: Urology;  Laterality: N/A;  . CYSTOSCOPY W/ RETROGRADES Bilateral 07/31/2018    Procedure: CYSTOSCOPY WITH BILATERAL RETROGRADE PYELOGRAM;  Surgeon: [name], MD;  Location: [facility];  Service: Urology;  Laterality: Bilateral;  . CYSTOSCOPY WITH BIOPSY N/A 07/31/2018   Procedure: CYSTOSCOPY WITH BIOPSY;  Surgeon: [name], MD;  Location: [facility];  Service: Urology;  Laterality: N/A;  . LEFT HEART CATHETERIZATION WITH CORONARY ANGIOGRAM N/A 06/17/2014   Procedure: LEFT HEART CATHETERIZATION WITH CORONARY ANGIOGRAM;  Surgeon: [name], MD;  Location: [facility] CATH LAB;  Service: Cardiovascular;  Laterality: N/A;  . LEFT HEART CATHETERIZATION WITH CORONARY ANGIOGRAM N/A 02/06/2015   Procedure: LEFT HEART CATHETERIZATION WITH CORONARY ANGIOGRAM;  Surgeon: [name], MD;  Location: [facility] CATH LAB;  Service: Cardiovascular;  Laterality: N/A;  . PERIPHERAL VASCULAR ATHERECTOMY  08/28/2018   Procedure: PERIPHERAL VASCULAR ATHERECTOMY;  Surgeon: Serafina Mitchell, MD;  Location: [place] [facility];  Service: Cardiovascular;;  Prox lt.SFA, Distal SFA  . POLYPECTOMY  01/14/2019   Procedure: POLYPECTOMY;  Surgeon: Rogene Houston, MD;  [facility];  Service: Endoscopy;;  colon  . PROSTATE BIOPSY N/A 03/31/2014   Procedure: BIOPSY TRANSRECTAL ULTRASONIC PROSTATE (TUBP);  Surgeon: [name], MD;  Location[facility];  Service: Urology;  Laterality: N/A;  . TRANSURETHRAL RESECTION OF BLADDER TUMOR N/A 03/14/2013   Procedure: TRANSURETHRAL RESECTION OF BLADDER TUMOR (TURBT);  Surgeon: [name], MD;  Location: [facility];  Service: Urology;  Laterality: N/A;  1 HR WITH MITOMYCIN INSTILLATION   . TRANSURETHRAL RESECTION OF BLADDER TUMOR N/A 09/30/2013   Procedure: TRANSURETHRAL RESECTION OF BLADDER TUMOR (TURBT);  Surgeon: [name], MD;  Location: [facility];  Service: Urology;  Laterality: N/A;  . TRANSURETHRAL RESECTION OF BLADDER TUMOR N/A 03/31/2014   Procedure: TRANSURETHRAL RESECTION OF  BLADDER TUMOR (TURBT);  Surgeon: [name], MD;  Location: [facility];  Service: Urology;  Laterality: N/A;  . TRANSURETHRAL RESECTION OF BLADDER TUMOR N/A 05/31/2015   Procedure: Cystoscopy with clott evacuation and fulgeration of bleeders, retrograde pylegram;  Surgeon: [name], MD;  Location: [facility];  Service: Urology;  Laterality: N/A;    Allergies  Allergen Reactions  . Lipitor [Atorvastatin] Other (See Comments)    Myalgia  . Other Other (See Comments)    All Cholesterol Meds - causes muscle and joint pain  . Pravastatin Other (See Comments)    Myalgia    Current Outpatient Medications  Medication Sig Dispense Refill  . aspirin 81 MG chewable tablet Chew 1 tablet (81 mg total) by mouth daily.    . Cholecalciferol 25 MCG (1000 UT) tablet Take 1,000 Units by mouth every other day.     . hydrALAZINE (APRESOLINE) 25 MG tablet TAKE 1 TABLET BY MOUTH THREE TIMES A DAY 270 tablet 3  . metFORMIN (GLUCOPHAGE) 500 MG tablet Take 1 tablet (500 mg total) by mouth 2 (two) times daily with a meal. 60 tablet 11  . metoprolol succinate (TOPROL-XL) 25 MG 24 hr tablet TAKE  3 TABLES TOTAL DAILY (Patient taking differently: Take 25 mg by mouth every 8 (eight) hours. ) 270 tablet 3  . Polyethyl Glycol-Propyl Glycol (SYSTANE OP) Place 1-2 drops into both eyes daily as needed (dry eyes).      No current facility-administered medications for this visit.     ROS: See HPI for pertinent positives and negatives.   Physical Examination  Vitals:   06/20/19 1502  BP: 134/84  Resp: 12  Temp: 97.7 F (36.5 C)  TempSrc: Temporal  SpO2: 100%  Weight: 216 lb (98 kg)  Height: 5\' 8"  (1.727 m)   Body mass index is 32.84 kg/m.  General: A&O x 3, WDWN, obese male in NAD. Gait: normal HEENT: No gross abnormalities.  Pulmonary: Respirations are non labored, CTAB, good air movement in all fields Cardiac: regular rhythm, no detected murmur.         Carotid Bruits Right  Left   Negative Negative   Radial pulses are 2+ palpable bilaterally   Adominal aortic pulse is not palpable                         VASCULAR EXAM: Extremities without ischemic changes, without Gangrene; without open wounds.                                                                                                          LE Pulses Right Left       FEMORAL  2+ palpable  2+ palpable        POPLITEAL  not palpable   not palpable       POSTERIOR TIBIAL  3+ palpable   not palpable        DORSALIS PEDIS      ANTERIOR TIBIAL not palpable  not palpable    Abdomen: softly obese, NT, no palpable masses. Skin: no rashes, no cellulitis, no ulcers noted. Musculoskeletal: no muscle wasting or atrophy.  Neurologic: A&O X 3; appropriate affect, Sensation is normal; MOTOR FUNCTION:  moving all extremities equally, motor strength 5/5 throughout. Speech is fluent/normal. CN 2-12 intact except has significant hearing loss. Psychiatric: Thought content is normal, mood appropriate for clinical situation.    ASSESSMENT: Timothy [name] is a 73 y.o. male who is s/p atherectomyofleft superficial femoral and popliteal arteryand drug-coated balloon angioplastyofleft superficial femoral-popliteal artery on 08-28-18.  He has pain in both calves and low back after walking about 50 steps; this has remained stable since his last visit with me in January 2020. Left leg arterial duplex shows a 448 PSV at the mid to distal SFA, tri and biphasic waveforms noted to the level of the stenosis, bi and monophasic waveforms distal to the stenosis. No stenosis was noted on left leg arterial duplex exam in January 2020. Right ABI remains normal with triphasic waveforms, left ABI has declined slightly from normal to mild disease at 0.93, biphasic waveforms.   Bilateral femoral pulses are 2+ palpable.  Left pedal pulses are not palpable.   He alludes to some history of lumbar spine  issues, states his PCP  ordered some imaging of his lumbar spine, but he has not had this done yet. He indicated that he did not want back surgery. There are other modalities to address lumbar spine issues other than surgery. He spoke with his PCP re this.  There are no signs of ischemia in his feet or legs.   His atherosclerotic risk factors include currently in control DM, former smoker (x 30 years), CAD, obesity, statin intolerance, and hypertension.  He takes a daily ASA and Plavix.    DATA  Left LE Arterial Duplex (06-20-19): +-----------+-----------+-----+---------------+----------+----------------+ LEFT       PSV cm/s   RatioStenosis       Waveform  Comments         +-----------+-----------+-----+---------------+----------+----------------+ CFA Prox   107                            triphasic                  +-----------+-----------+-----+---------------+----------+----------------+ DFA        90                             triphasic                  +-----------+-----------+-----+---------------+----------+----------------+ SFA Prox   100                            biphasic                   +-----------+-----------+-----+---------------+----------+----------------+ SFA Mid    68                             biphasic                   +-----------+-----------+-----+---------------+----------+----------------+ SFA Distal 59 / 448/67     75-99% stenosisstenotic  mid to dstal SFA +-----------+-----------+-----+---------------+----------+----------------+ POP Mid    58                             biphasic                   +-----------+-----------+-----+---------------+----------+----------------+ POP Distal 81                             triphasic                  +-----------+-----------+-----+---------------+----------+----------------+ ATA Distal 10                             monophasic                  +-----------+-----------+-----+---------------+----------+----------------+ PTA Distal 74                             biphasic                   +-----------+-----------+-----+---------------+----------+----------------+ PERO Distal13                             monophasic                 +-----------+-----------+-----+---------------+----------+----------------+  Summary: Left: 75 - 99% stenosis in the mid to distal femoral artery.   ABI (Date: 06/20/2019): ABI Findings: +---------+------------------+-----+---------+--------+ Right    Rt Pressure (mmHg)IndexWaveform Comment  +---------+------------------+-----+---------+--------+ Brachial 169                                      +---------+------------------+-----+---------+--------+ PTA      185               1.09 triphasic         +---------+------------------+-----+---------+--------+ DP       187               1.11 triphasic         +---------+------------------+-----+---------+--------+ Great Toe118               0.70 Normal            +---------+------------------+-----+---------+--------+  +---------+------------------+-----+--------+-------+ Left     Lt Pressure (mmHg)IndexWaveformComment +---------+------------------+-----+--------+-------+ Brachial 164                                    +---------+------------------+-----+--------+-------+ PTA      157               0.93 biphasic        +---------+------------------+-----+--------+-------+ DP       151               0.89 biphasic        +---------+------------------+-----+--------+-------+ Great Toe84                0.50 Abnormal        +---------+------------------+-----+--------+-------+  +-------+-----------+-----------+------------+------------+ ABI/TBIToday's ABIToday's TBIPrevious ABIPrevious TBI +-------+-----------+-----------+------------+------------+ Right  1.11       0.70        1.14        0.79         +-------+-----------+-----------+------------+------------+ Left   0.93       0.50       1.1         0.66         +-------+-----------+-----------+------------+------------+ Summary: Right: Resting right ankle-brachial index is within normal range. No evidence of significant right lower extremity arterial disease. The right toe-brachial index is normal.  Left: Resting left ankle-brachial index indicates mild left lower extremity arterial disease. The left toe-brachial index is abnormal.   PLAN:  Based on the patient's vascular studies and examination, and after discussing with Dr. Oneida Alar, pt will be scheduled for an arteriogram soon, possible left LE intervention, by Dr. Trula Slade.  I discussed in depth with the patient the nature of atherosclerosis, and emphasized the importance of maximal medical management including strict control of blood pressure, blood glucose, and lipid levels, obtaining regular exercise, and continued cessation of smoking.  The patient is aware that without maximal medical management the underlying atherosclerotic disease process will progress, limiting the benefit of any interventions.  The patient was given information about PAD including signs, symptoms, treatment, what symptoms should prompt the patient to seek immediate medical care, and risk reduction measures to take.  Clemon Chambers, RN, MSN, FNP-C [facility] Phone: [phone]  Clinic MD: Oneida Alar  06/20/19 3:06 PM

## 2019-06-20 NOTE — Patient Instructions (Signed)
Peripheral Vascular Disease  Peripheral vascular disease (PVD) is a disease of the blood vessels that are not part of your heart and brain. A simple term for PVD is poor circulation. In most cases, PVD narrows the blood vessels that carry blood from your heart to the rest of your body. This can reduce the supply of blood to your arms, legs, and internal organs, like your stomach or kidneys. However, PVD most often affects a person's lower legs and feet. Without treatment, PVD tends to get worse. PVD can also lead to acute ischemic limb. This is when an arm or leg suddenly cannot get enough blood. This is a medical emergency. Follow these instructions at home: Lifestyle  Do not use any products that contain nicotine or tobacco, such as cigarettes and e-cigarettes. If you need help quitting, ask your doctor.  Lose weight if you are overweight. Or, stay at a healthy weight as told by your doctor.  Eat a diet that is low in fat and cholesterol. If you need help, ask your doctor.  Exercise regularly. Ask your doctor for activities that are right for you. General instructions  Take over-the-counter and prescription medicines only as told by your doctor.  Take good care of your feet: ? Wear comfortable shoes that fit well. ? Check your feet often for any cuts or sores.  Keep all follow-up visits as told by your doctor This is important. Contact a doctor if:  You have cramps in your legs when you walk.  You have leg pain when you are at rest.  You have coldness in a leg or foot.  Your skin changes.  You are unable to get or have an erection (erectile dysfunction).  You have cuts or sores on your feet that do not heal. Get help right away if:  Your arm or leg turns cold, numb, and blue.  Your arms or legs become red, warm, swollen, painful, or numb.  You have chest pain.  You have trouble breathing.  You suddenly have weakness in your face, arm, or leg.  You become very  confused or you cannot speak.  You suddenly have a very bad headache.  You suddenly cannot see. Summary  Peripheral vascular disease (PVD) is a disease of the blood vessels.  A simple term for PVD is poor circulation. Without treatment, PVD tends to get worse.  Treatment may include exercise, low fat and low cholesterol diet, and quitting smoking. This information is not intended to replace advice given to you by your health care provider. Make sure you discuss any questions you have with your health care provider. Document Released: 02/15/2010 Document Revised: 11/03/2017 Document Reviewed: 12/29/2016 Elsevier Patient Education  2020 [employer]  

## 2019-06-20 NOTE — Progress Notes (Signed)
[facility] [place]   CC: Follow up peripheral artery occlusive disease  History of Present Illness Timothy [name] is a 73 y.o. male who is s/p atherectomyofleft superficial femoral and popliteal arteryand drug-coated balloon angioplastyofleft superficial femoral-popliteal artery on 08-28-18 by Dr. Trula Slade forlifestyle limiting claudication in his left leg.   He returns today for ABI's,duplex of left leg arterial system, and evaluation.   He reports low back pain since about age 36, states he has discussed this with his PCP. He has occasisonal pain in his low back with occasional right radiculopathy type symptoms, but only sometimes with walking. He has no non healing wounds. After walking about 50 steps both calves hurt, relieved by rest. Pt states his PCP advised that he get imaging of his lumbar spine, pt states he did not get this done.    He denies any known history of stroke or TIA.  At today's visit he denies chest pain or dyspnea, denies abdominal pain.   He states he sees a nephrologist in [place], last serum creatinine and GFR result on file was 1.54 and 51 on 07-30-18, stage 3a CKD.   He has CAD, had a NSTEMI in 2015, sees a cardiologist.   Diabetic: Yes, last A1C result on file was 7.2 on 07-30-18, his DM meds were changed recently, he states his last A1C was 6.? Tobacco use: former smoker, quit in 1994, smoked x 30 years  Pt meds include: Statin :No, statins caused myalgias  Betablocker: Yes ASA: Yes Other anticoagulants/antiplatelets: Plavix   Past Medical History:  Diagnosis Date  . Arthritis   . Bladder cancer ([place])   . CAD (coronary artery disease)    a. 06/2014: NSTEMI s/p PTCA of LCx, 60-80% residual in distal LAD, 70% prox small D1.   b. 02/06/15 NSTEMI  s/p successful PCI/DES to mid RCA  . Diabetes mellitus, type 2 ([place])   . Elevated PSA   . HLD (hyperlipidemia)   . HOH (hard of hearing)   . Hypertension   .  NSTEMI (non-ST elevated myocardial infarction) [facility])     Social History Social History   Tobacco Use  . Smoking status: Former Smoker    Packs/day: 1.00    Years: 30.00    Pack years: 30.00    Types: Cigarettes    Quit date: 03/11/1993    Years since quitting: 26.2  . Smokeless tobacco: Never Used  Substance Use Topics  . Alcohol use: No    Alcohol/week: 0.0 standard drinks  . Drug use: No    Family History Family History  Problem Relation Age of Onset  . Cancer Child   . Asthma Mother     Past Surgical History:  Procedure Laterality Date  . ABDOMINAL AORTOGRAM W/LOWER EXTREMITY N/A 08/28/2018   Procedure: ABDOMINAL AORTOGRAM W/LOWER EXTREMITY;  Surgeon: Serafina Mitchell, MD;  Location: [facility];  Service: Cardiovascular;  Laterality: N/A;  bilateral  . CARDIAC CATHETERIZATION  02/06/2015   Procedure: CORONARY STENT INTERVENTION;  Surgeon: [name], MD;  Location: [facility] CATH LAB;  Service: Cardiovascular;;  Mid RCA  . CARDIAC CATHETERIZATION N/A 11/11/2016   Procedure: Left Heart Cath and Coronary Angiography;  Surgeon: [name], MD;  Location: [facility];  Service: Cardiovascular;  Laterality: N/A;  . CARDIAC CATHETERIZATION N/A 11/11/2016   Procedure: Coronary Balloon Angioplasty;  Surgeon: [name], MD;  Location: [facility];  Service: Cardiovascular;  Laterality: N/A;  . COLONOSCOPY N/A 01/14/2019  Procedure: COLONOSCOPY;  Surgeon: Rogene Houston, MD;  [facility];  Service: Endoscopy;  Laterality: N/A;  730  . CORONARY ANGIOPLASTY  11/11/2016   Mid Cx to Dist Cx lesion, 100 %stenosed. This lesion was treated with balloon angioplasty with a 2.0 balloon  . CYSTOSCOPY W/ RETROGRADES N/A 05/05/2015   Procedure: CYSTOSCOPY WITH BILATERAL RETROGRADE PYELOGRAMS AND BLADDER BIOPSIES;  Surgeon: [name], MD;  Location: [facility];  Service: Urology;  Laterality: N/A;  . CYSTOSCOPY W/ RETROGRADES Bilateral 07/31/2018    Procedure: CYSTOSCOPY WITH BILATERAL RETROGRADE PYELOGRAM;  Surgeon: [name], MD;  Location: [facility];  Service: Urology;  Laterality: Bilateral;  . CYSTOSCOPY WITH BIOPSY N/A 07/31/2018   Procedure: CYSTOSCOPY WITH BIOPSY;  Surgeon: [name], MD;  Location: [facility];  Service: Urology;  Laterality: N/A;  . LEFT HEART CATHETERIZATION WITH CORONARY ANGIOGRAM N/A 06/17/2014   Procedure: LEFT HEART CATHETERIZATION WITH CORONARY ANGIOGRAM;  Surgeon: [name], MD;  Location: [facility] CATH LAB;  Service: Cardiovascular;  Laterality: N/A;  . LEFT HEART CATHETERIZATION WITH CORONARY ANGIOGRAM N/A 02/06/2015   Procedure: LEFT HEART CATHETERIZATION WITH CORONARY ANGIOGRAM;  Surgeon: [name], MD;  Location: [facility] CATH LAB;  Service: Cardiovascular;  Laterality: N/A;  . PERIPHERAL VASCULAR ATHERECTOMY  08/28/2018   Procedure: PERIPHERAL VASCULAR ATHERECTOMY;  Surgeon: Serafina Mitchell, MD;  Location: [facility];  Service: Cardiovascular;;  Prox lt.SFA, Distal SFA  . POLYPECTOMY  01/14/2019   Procedure: POLYPECTOMY;  Surgeon: Rogene Houston, MD;  [facility];  Service: Endoscopy;;  colon  . PROSTATE BIOPSY N/A 03/31/2014   Procedure: BIOPSY TRANSRECTAL ULTRASONIC PROSTATE (TUBP);  Surgeon: [name], MD;  Location: [facility];  Service: Urology;  Laterality: N/A;  . TRANSURETHRAL RESECTION OF BLADDER TUMOR N/A 03/14/2013   Procedure: TRANSURETHRAL RESECTION OF BLADDER TUMOR (TURBT);  Surgeon: [name], MD;  Location: [facility];  Service: Urology;  Laterality: N/A;  1 HR WITH MITOMYCIN INSTILLATION   . TRANSURETHRAL RESECTION OF BLADDER TUMOR N/A 09/30/2013   Procedure: TRANSURETHRAL RESECTION OF BLADDER TUMOR (TURBT);  Surgeon: [name], MD;  Location: [facility];  Service: Urology;  Laterality: N/A;  . TRANSURETHRAL RESECTION OF BLADDER TUMOR N/A 03/31/2014   Procedure: TRANSURETHRAL RESECTION OF  BLADDER TUMOR (TURBT);  Surgeon: [name], MD;  Location: [facility];  Service: Urology;  Laterality: N/A;  . TRANSURETHRAL RESECTION OF BLADDER TUMOR N/A 05/31/2015   Procedure: Cystoscopy with clott evacuation and fulgeration of bleeders, retrograde pylegram;  Surgeon: [name], MD;  Location: [facility];  Service: Urology;  Laterality: N/A;    Allergies  Allergen Reactions  . Lipitor [Atorvastatin] Other (See Comments)    Myalgia  . Other Other (See Comments)    All Cholesterol Meds - causes muscle and joint pain  . Pravastatin Other (See Comments)    Myalgia    Current Outpatient Medications  Medication Sig Dispense Refill  . aspirin 81 MG chewable tablet Chew 1 tablet (81 mg total) by mouth daily.    . Cholecalciferol 25 MCG (1000 UT) tablet Take 1,000 Units by mouth every other day.     . hydrALAZINE (APRESOLINE) 25 MG tablet TAKE 1 TABLET BY MOUTH THREE TIMES A DAY 270 tablet 3  . metFORMIN (GLUCOPHAGE) 500 MG tablet Take 1 tablet (500 mg total) by mouth 2 (two) times daily with a meal. 60 tablet 11  . metoprolol succinate (TOPROL-XL) 25 MG 24 hr tablet TAKE  3 TABLES TOTAL DAILY (Patient taking differently: Take 25 mg by mouth every 8 (eight) hours. ) 270 tablet 3  . Polyethyl Glycol-Propyl Glycol (SYSTANE OP) Place 1-2 drops into both eyes daily as needed (dry eyes).      No current facility-administered medications for this visit.     ROS: See HPI for pertinent positives and negatives.   Physical Examination  Vitals:   06/20/19 1502  BP: 134/84  Resp: 12  Temp: 97.7 F (36.5 C)  TempSrc: Temporal  SpO2: 100%  Weight: 216 lb (98 kg)  Height: 5\' 8"  (1.727 m)   Body mass index is 32.84 kg/m.  General: A&O x 3, WDWN, obese male in NAD. Gait: normal HEENT: No gross abnormalities.  Pulmonary: Respirations are non labored, CTAB, good air movement in all fields Cardiac: regular rhythm, no detected murmur.         Carotid Bruits Right  Left   Negative Negative   Radial pulses are 2+ palpable bilaterally   Adominal aortic pulse is not palpable                         VASCULAR EXAM: Extremities without ischemic changes, without Gangrene; without open wounds.                                                                                                          LE Pulses Right Left       FEMORAL  2+ palpable  2+ palpable        POPLITEAL  not palpable   not palpable       POSTERIOR TIBIAL  3+ palpable   not palpable        DORSALIS PEDIS      ANTERIOR TIBIAL not palpable  not palpable    Abdomen: softly obese, NT, no palpable masses. Skin: no rashes, no cellulitis, no ulcers noted. Musculoskeletal: no muscle wasting or atrophy.  Neurologic: A&O X 3; appropriate affect, Sensation is normal; MOTOR FUNCTION:  moving all extremities equally, motor strength 5/5 throughout. Speech is fluent/normal. CN 2-12 intact except has significant hearing loss. Psychiatric: Thought content is normal, mood appropriate for clinical situation.    ASSESSMENT: Timothy [name] is a 73 y.o. male who is s/p atherectomyofleft superficial femoral and popliteal arteryand drug-coated balloon angioplastyofleft superficial femoral-popliteal artery on 08-28-18.  He has pain in both calves and low back after walking about 50 steps; this has remained stable since his last visit with me in January 2020. Left leg arterial duplex shows a 448 PSV at the mid to distal SFA, tri and biphasic waveforms noted to the level of the stenosis, bi and monophasic waveforms distal to the stenosis. No stenosis was noted on left leg arterial duplex exam in January 2020. Right ABI remains normal with triphasic waveforms, left ABI has declined slightly from normal to mild disease at 0.93, biphasic waveforms.   Bilateral femoral pulses are 2+ palpable.  Left pedal pulses are not palpable.   He alludes to some history of lumbar spine  issues, states his PCP  ordered some imaging of his lumbar spine, but he has not had this done yet. He indicated that he did not want back surgery. There are other modalities to address lumbar spine issues other than surgery. He spoke with his PCP re this.  There are no signs of ischemia in his feet or legs.   His atherosclerotic risk factors include currently in control DM, former smoker (x 30 years), CAD, obesity, statin intolerance, and hypertension.  He takes a daily ASA and Plavix.    DATA  Left LE Arterial Duplex (06-20-19): +-----------+-----------+-----+---------------+----------+----------------+ LEFT       PSV cm/s   RatioStenosis       Waveform  Comments         +-----------+-----------+-----+---------------+----------+----------------+ CFA Prox   107                            triphasic                  +-----------+-----------+-----+---------------+----------+----------------+ DFA        90                             triphasic                  +-----------+-----------+-----+---------------+----------+----------------+ SFA Prox   100                            biphasic                   +-----------+-----------+-----+---------------+----------+----------------+ SFA Mid    68                             biphasic                   +-----------+-----------+-----+---------------+----------+----------------+ SFA Distal 59 / 448/67     75-99% stenosisstenotic  mid to dstal SFA +-----------+-----------+-----+---------------+----------+----------------+ POP Mid    58                             biphasic                   +-----------+-----------+-----+---------------+----------+----------------+ POP Distal 81                             triphasic                  +-----------+-----------+-----+---------------+----------+----------------+ ATA Distal 10                             monophasic                  +-----------+-----------+-----+---------------+----------+----------------+ PTA Distal 74                             biphasic                   +-----------+-----------+-----+---------------+----------+----------------+ PERO Distal13                             monophasic                 +-----------+-----------+-----+---------------+----------+----------------+  Summary: Left: 75 - 99% stenosis in the mid to distal femoral artery.   ABI (Date: 06/20/2019): ABI Findings: +---------+------------------+-----+---------+--------+ Right    Rt Pressure (mmHg)IndexWaveform Comment  +---------+------------------+-----+---------+--------+ Brachial 169                                      +---------+------------------+-----+---------+--------+ PTA      185               1.09 triphasic         +---------+------------------+-----+---------+--------+ DP       187               1.11 triphasic         +---------+------------------+-----+---------+--------+ Great Toe118               0.70 Normal            +---------+------------------+-----+---------+--------+  +---------+------------------+-----+--------+-------+ Left     Lt Pressure (mmHg)IndexWaveformComment +---------+------------------+-----+--------+-------+ Brachial 164                                    +---------+------------------+-----+--------+-------+ PTA      157               0.93 biphasic        +---------+------------------+-----+--------+-------+ DP       151               0.89 biphasic        +---------+------------------+-----+--------+-------+ Great Toe84                0.50 Abnormal        +---------+------------------+-----+--------+-------+  +-------+-----------+-----------+------------+------------+ ABI/TBIToday's ABIToday's TBIPrevious ABIPrevious TBI +-------+-----------+-----------+------------+------------+ Right  1.11       0.70        1.14        0.79         +-------+-----------+-----------+------------+------------+ Left   0.93       0.50       1.1         0.66         +-------+-----------+-----------+------------+------------+ Summary: Right: Resting right ankle-brachial index is within normal range. No evidence of significant right lower extremity arterial disease. The right toe-brachial index is normal.  Left: Resting left ankle-brachial index indicates mild left lower extremity arterial disease. The left toe-brachial index is abnormal.   PLAN:  Based on the patient's vascular studies and examination, and after discussing with Dr. Oneida Alar, pt will be scheduled for an arteriogram soon, possible left LE intervention, by Dr. Trula Slade.  I discussed in depth with the patient the nature of atherosclerosis, and emphasized the importance of maximal medical management including strict control of blood pressure, blood glucose, and lipid levels, obtaining regular exercise, and continued cessation of smoking.  The patient is aware that without maximal medical management the underlying atherosclerotic disease process will progress, limiting the benefit of any interventions.  The patient was given information about PAD including signs, symptoms, treatment, what symptoms should prompt the patient to seek immediate medical care, and risk reduction measures to take.  Clemon Chambers, RN, MSN, FNP-C [facility] Phone: [phone]  Clinic MD: Oneida Alar  06/20/19 3:06 PM

## 2019-06-21 ENCOUNTER — Other Ambulatory Visit: Payer: Self-pay | Admitting: *Deleted

## 2019-07-01 DIAGNOSIS — R69 Illness, unspecified: Secondary | ICD-10-CM | POA: Diagnosis not present

## 2019-07-04 ENCOUNTER — Telehealth: Payer: Self-pay | Admitting: Cardiology

## 2019-07-04 NOTE — Telephone Encounter (Signed)
pATIENT HAS SCALE BUT DOES NOT HAVE BP CUFF      Virtual Visit Pre-Appointment Phone Call  "(Name), I am calling you today to discuss your upcoming appointment. We are currently trying to limit exposure to the virus that causes COVID-19 by seeing patients at home rather than in the office."  1. "What is the BEST phone number to call the day of the visit?" - include this in appointment notes  2. Do you have or have access to (through a family member/friend) a smartphone with video capability that we can use for your visit?" a. If yes - list this number in appt notes as cell (if different from BEST phone #) and list the appointment type as a VIDEO visit in appointment notes b. If no - list the appointment type as a PHONE visit in appointment notes  3. Confirm consent - "In the setting of the current Covid19 crisis, you are scheduled for a (phone or video) visit with your provider on (date) at (time).  Just as we do with many in-office visits, in order for you to participate in this visit, we must obtain consent.  If you'd like, I can send this to your mychart (if signed up) or email for you to review.  Otherwise, I can obtain your verbal consent now.  All virtual visits are billed to your insurance company just like a normal visit would be.  By agreeing to a virtual visit, we'd like you to understand that the technology does not allow for your provider to perform an examination, and thus may limit your provider's ability to fully assess your condition. If your provider identifies any concerns that need to be evaluated in person, we will make arrangements to do so.  Finally, though the technology is pretty good, we cannot assure that it will always work on either your or our end, and in the setting of a video visit, we may have to convert it to a phone-only visit.  In either situation, we cannot ensure that we have a secure connection.  Are you willing to proceed?" STAFF: Did the patient verbally  acknowledge consent to telehealth visit? Document YES/NO here: YES  4. Advise patient to be prepared - "Two hours prior to your appointment, go ahead and check your blood pressure, pulse, oxygen saturation, and your weight (if you have the equipment to check those) and write them all down. When your visit starts, your provider will ask you for this information. If you have an Apple Watch or Kardia device, please plan to have heart rate information ready on the day of your appointment. Please have a pen and paper handy nearby the day of the visit as well."  5. Give patient instructions for MyChart download to smartphone OR [email or web address] as below if video visit (depending on what platform provider is using)  6. Inform patient they will receive a phone call 15 minutes prior to their appointment time (may be from unknown caller ID) so they should be prepared to answer    TELEPHONE CALL NOTE  Timothy [name] has been deemed a candidate for a follow-up tele-health visit to limit community exposure during the Covid-19 pandemic. I spoke with the patient via phone to ensure availability of phone/video source, confirm preferred email & phone number, and discuss instructions and expectations.  I reminded Timothy [name] to be prepared with any vital sign and/or heart rhythm information that could potentially be obtained via home monitoring, at the time of his visit. I reminded  Timothy [name] to expect a phone call prior to his visit.  Timothy [name] 07/04/2019 3:51 PM   INSTRUCTIONS FOR DOWNLOADING THE MYCHART APP TO SMARTPHONE  - The patient must first make sure to have activated MyChart and know their login information - If Apple, go to [employer] and type in MyChart in the search bar and download the app. If Android, ask patient to go to [employer] and type in [place] in the search bar and download the app. The app is free but as with any other app downloads, their phone may require them  to verify saved payment information or Apple/Android password.  - The patient will need to then log into the app with their MyChart username and password, and select [facility] as their healthcare provider to link the account. When it is time for your visit, go to the MyChart app, find appointments, and click Begin Video Visit. Be sure to Select Allow for your device to access the Microphone and Camera for your visit. You will then be connected, and your provider will be with you shortly.  **If they have any issues connecting, or need assistance please contact MyChart service desk [phone])**  **If using a computer, in order to ensure the best quality for their visit they will need to use either of the following Internet Browsers: [employer], or Google Chrome**  IF USING DOXIMITY or DOXY.ME - The patient will receive a link just prior to their visit by text.     FULL LENGTH CONSENT FOR TELE-HEALTH VISIT   I hereby voluntarily request, consent and authorize [facility] and its employed or contracted physicians, physician assistants, nurse practitioners or other licensed health care professionals (the Practitioner), to provide me with telemedicine health care services (the Services") as deemed necessary by the treating Practitioner. I acknowledge and consent to receive the Services by the Practitioner via telemedicine. I understand that the telemedicine visit will involve communicating with the Practitioner through live audiovisual communication technology and the disclosure of certain medical information by electronic transmission. I acknowledge that I have been given the opportunity to request an in-person assessment or other available alternative prior to the telemedicine visit and am voluntarily participating in the telemedicine visit.  I understand that I have the right to withhold or withdraw my consent to the use of telemedicine in the course of my care at any time,  without affecting my right to future care or treatment, and that the Practitioner or I may terminate the telemedicine visit at any time. I understand that I have the right to inspect all information obtained and/or recorded in the course of the telemedicine visit and may receive copies of available information for a reasonable fee.  I understand that some of the potential risks of receiving the Services via telemedicine include:   Delay or interruption in medical evaluation due to technological equipment failure or disruption;  Information transmitted may not be sufficient (e.g. poor resolution of images) to allow for appropriate medical decision making by the Practitioner; and/or   In rare instances, security protocols could fail, causing a breach of personal health information.  Furthermore, I acknowledge that it is my responsibility to provide information about my medical history, conditions and care that is complete and accurate to the best of my ability. I acknowledge that Practitioner's advice, recommendations, and/or decision may be based on factors not within their control, such as incomplete or inaccurate data provided by me or distortions of diagnostic images  or specimens that may result from electronic transmissions. I understand that the practice of medicine is not an exact science and that Practitioner makes no warranties or guarantees regarding treatment outcomes. I acknowledge that I will receive a copy of this consent concurrently upon execution via email to the email address I last provided but may also request a printed copy by calling the office of [facility].    I understand that my insurance will be billed for this visit.   I have read or had this consent read to me.  I understand the contents of this consent, which adequately explains the benefits and risks of the Services being provided via telemedicine.   I have been provided ample opportunity to ask questions regarding  this consent and the Services and have had my questions answered to my satisfaction.  I give my informed consent for the services to be provided through the use of telemedicine in my medical care  By participating in this telemedicine visit I agree to the above.

## 2019-07-05 ENCOUNTER — Other Ambulatory Visit: Payer: Self-pay

## 2019-07-05 ENCOUNTER — Telehealth: Payer: Medicare HMO | Admitting: Cardiology

## 2019-07-05 ENCOUNTER — Other Ambulatory Visit (HOSPITAL_COMMUNITY)
Admission: RE | Admit: 2019-07-05 | Discharge: 2019-07-05 | Disposition: A | Discharge status: Home / Self Care | Payer: Medicare HMO | Source: Ambulatory Visit | Attending: Surgery | Admitting: Surgery

## 2019-07-05 DIAGNOSIS — Z20828 Contact with and (suspected) exposure to other viral communicable diseases: Secondary | ICD-10-CM | POA: Insufficient documentation

## 2019-07-05 LAB — SARS CORONAVIRUS 2 (TAT 6-24 HRS): SARS Coronavirus 2: NEGATIVE

## 2019-07-05 NOTE — Progress Notes (Deleted)
Clinical Summary Mr. Ahlgren is a 73 y.o.male  seen today for follow up of the medical problems.   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 Echo LVEF 50-55%, inferior hypokinesis, grade I diastolic dysfunction. -admit 11/2016 with NSTEMI. Cath as reported below. Received balloon angioplasty of mid to distal LCX, too small to be stented.  - echo 11/2016 LVEF 40-45%.   - last visit he was doing well without symptoms but was noted to have new inferior and lateral precordial TWIs, and lateral Qwaves - referred for stress test. Jan 2019 nuclear stress small moderate intensity partially reversible mid to apical anterior defect consistent with mild ischemia. LVEF 25% by nuclear, however echo 02/2018 showed LVEF stabel 40-45%.  - no recent chest pain, SOB, or DOE - compliant with meds.   2. HTN - norvasc that we started last visit caused foot pain, stopped and resolved.  - last visit started hydralazine    3. Hyperlipidemia - intolerant to statins, has been on zetia only. - labs followed by pcp  4. PAD - 07/2018 ABIs right 1.12, left 0.8 - recent left sided claudication, has appt coming up with vascular  - s/p left SFA atherectomy and left popliteal PTCA 08/2018  06/2018 LE [place]: 75-99% left SFA disease. ABI right 1.11, left 0.93 - plans for lower extremity angio next week per vascular     AAA screen: neg [place] 2018 Past Medical History:  Diagnosis Date  . Arthritis   . Bladder cancer ([place])   . CAD (coronary artery disease)    a. 06/2014: NSTEMI s/p PTCA of LCx, 60-80% residual in distal LAD, 70% prox small D1.   b. 02/06/15 NSTEMI  s/p successful PCI/DES to mid RCA  . Diabetes mellitus, type 2 ([place])   . Elevated PSA   . HLD (hyperlipidemia)   . HOH (hard of hearing)   . Hypertension   . NSTEMI (non-ST elevated myocardial infarction) ([facility])      Allergies  Allergen Reactions  . Lipitor [Atorvastatin] Other (See Comments)     Myalgia  . Other Other (See Comments)    All Cholesterol Meds - causes muscle and joint pain  . Pravastatin Other (See Comments)    Myalgia     Current Outpatient Medications  Medication Sig Dispense Refill  . acetaminophen (TYLENOL) 500 MG tablet Take 500 mg by mouth every 6 (six) hours as needed (pain).    Marland Kitchen aspirin 81 MG chewable tablet Chew 1 tablet (81 mg total) by mouth daily. (Patient taking differently: Chew 81 mg by mouth daily after breakfast. )    . Cholecalciferol 25 MCG (1000 UT) tablet Take 1,000 Units by mouth 2 (two) times a week.     . hydrALAZINE (APRESOLINE) 25 MG tablet TAKE 1 TABLET BY MOUTH THREE TIMES A DAY (Patient taking differently: Take 25 mg by mouth 3 (three) times daily. ) 270 tablet 3  . metoprolol succinate (TOPROL-XL) 25 MG 24 hr tablet TAKE 3 TABLES TOTAL DAILY (Patient taking differently: Take 25 mg by mouth every 8 (eight) hours. ) 270 tablet 3  . Omega-3 Fatty Acids (FISH OIL PO) Take 1 capsule by mouth daily.    . sitaGLIPtin (JANUVIA) 100 MG tablet Take 100 mg by mouth every morning. (1100)     No current facility-administered medications for this visit.      Past Surgical History:  Procedure Laterality Date  . ABDOMINAL AORTOGRAM W/LOWER EXTREMITY N/A 08/28/2018   Procedure:  ABDOMINAL AORTOGRAM W/LOWER EXTREMITY;  Surgeon: Serafina Mitchell, MD;  Location: [place] [facility];  Service: Cardiovascular;  Laterality: N/A;  bilateral  . CARDIAC CATHETERIZATION  02/06/2015   Procedure: CORONARY STENT INTERVENTION;  Surgeon: [name], MD;  Location: [facility];  Service: Cardiovascular;;  Mid RCA  . CARDIAC CATHETERIZATION N/A 11/11/2016   Procedure: Left Heart Cath and Coronary Angiography;  Surgeon: [name], MD;  Location: [place] [facility];  Service: Cardiovascular;  Laterality: N/A;  . CARDIAC CATHETERIZATION N/A 11/11/2016   Procedure: Coronary Balloon Angioplasty;  Surgeon: [name], MD;  Location: [place] [facility];   Service: Cardiovascular;  Laterality: N/A;  . COLONOSCOPY N/A 01/14/2019   Procedure: COLONOSCOPY;  Surgeon: Rogene Houston, MD;  [facility];  Service: Endoscopy;  Laterality: N/A;  730  . CORONARY ANGIOPLASTY  11/11/2016   Mid Cx to Dist Cx lesion, 100 %stenosed. This lesion was treated with balloon angioplasty with a 2.0 balloon  . CYSTOSCOPY W/ RETROGRADES N/A 05/05/2015   Procedure: CYSTOSCOPY WITH BILATERAL RETROGRADE PYELOGRAMS AND BLADDER BIOPSIES;  Surgeon: [name], MD;  Location: [facility];  Service: Urology;  Laterality: N/A;  . CYSTOSCOPY W/ RETROGRADES Bilateral 07/31/2018   Procedure: CYSTOSCOPY WITH BILATERAL RETROGRADE PYELOGRAM;  Surgeon: [name], MD;  Location: [facility];  Service: Urology;  Laterality: Bilateral;  . CYSTOSCOPY WITH BIOPSY N/A 07/31/2018   Procedure: CYSTOSCOPY WITH BIOPSY;  Surgeon: [name], MD;  Location: [facility];  Service: Urology;  Laterality: N/A;  . LEFT HEART CATHETERIZATION WITH CORONARY ANGIOGRAM N/A 06/17/2014   Procedure: LEFT HEART CATHETERIZATION WITH CORONARY ANGIOGRAM;  Surgeon: [name], MD;  Location: [facility] CATH LAB;  Service: Cardiovascular;  Laterality: N/A;  . LEFT HEART CATHETERIZATION WITH CORONARY ANGIOGRAM N/A 02/06/2015   Procedure: LEFT HEART CATHETERIZATION WITH CORONARY ANGIOGRAM;  Surgeon: [name], MD;  Location: [facility] CATH LAB;  Service: Cardiovascular;  Laterality: N/A;  . PERIPHERAL VASCULAR ATHERECTOMY  08/28/2018   Procedure: PERIPHERAL VASCULAR ATHERECTOMY;  Surgeon: Serafina Mitchell, MD;  Location: [place];  Service: Cardiovascular;;  Prox lt.SFA, Distal SFA  . POLYPECTOMY  01/14/2019   Procedure: POLYPECTOMY;  Surgeon: Rogene Houston, MD;  [facility];  Service: Endoscopy;;  colon  . PROSTATE BIOPSY N/A 03/31/2014   Procedure: BIOPSY TRANSRECTAL ULTRASONIC PROSTATE (TUBP);  Surgeon: [name], MD;  Location: [facility];  Service: Urology;   Laterality: N/A;  . TRANSURETHRAL RESECTION OF BLADDER TUMOR N/A 03/14/2013   Procedure: TRANSURETHRAL RESECTION OF BLADDER TUMOR (TURBT);  Surgeon: [name], MD;  Location: [facility];  Service: Urology;  Laterality: N/A;  1 HR WITH MITOMYCIN INSTILLATION   . TRANSURETHRAL RESECTION OF BLADDER TUMOR N/A 09/30/2013   Procedure: TRANSURETHRAL RESECTION OF BLADDER TUMOR (TURBT);  Surgeon: [name], MD;  Location: [facility];  Service: Urology;  Laterality: N/A;  . TRANSURETHRAL RESECTION OF BLADDER TUMOR N/A 03/31/2014   Procedure: TRANSURETHRAL RESECTION OF BLADDER TUMOR (TURBT);  Surgeon: [name], MD;  Location: [facility];  Service: Urology;  Laterality: N/A;  . TRANSURETHRAL RESECTION OF BLADDER TUMOR N/A 05/31/2015   Procedure: Cystoscopy with clott evacuation and fulgeration of bleeders, retrograde pylegram;  Surgeon: [name], MD;  Location: [facility];  Service: Urology;  Laterality: N/A;     Allergies  Allergen Reactions  . Lipitor [Atorvastatin] Other (See Comments)    Myalgia  . Other Other (See Comments)  All Cholesterol Meds - causes muscle and joint pain  . Pravastatin Other (See Comments)    Myalgia      Family History  Problem Relation Age of Onset  . Cancer Child   . Asthma Mother      Social History Mr. Woodbury reports that he quit smoking about 26 years ago. His smoking use included cigarettes. He has a 30.00 pack-year smoking history. He has never used smokeless tobacco. Mr. Rathert reports no history of alcohol use.   Review of Systems CONSTITUTIONAL: No weight loss, fever, chills, weakness or fatigue.  HEENT: Eyes: No visual loss, blurred vision, double vision or yellow sclerae.No hearing loss, sneezing, congestion, runny nose or sore throat.  SKIN: No rash or itching.  CARDIOVASCULAR:  RESPIRATORY: No shortness of breath, cough or sputum.  GASTROINTESTINAL: No anorexia,  nausea, vomiting or diarrhea. No abdominal pain or blood.  GENITOURINARY: No burning on urination, no polyuria NEUROLOGICAL: No headache, dizziness, syncope, paralysis, ataxia, numbness or tingling in the extremities. No change in bowel or bladder control.  MUSCULOSKELETAL: No muscle, back pain, joint pain or stiffness.  LYMPHATICS: No enlarged nodes. No history of splenectomy.  PSYCHIATRIC: No history of depression or anxiety.  ENDOCRINOLOGIC: No reports of sweating, cold or heat intolerance. No polyuria or polydipsia.  Marland Kitchen   Physical Examination There were no vitals filed for this visit. There were no vitals filed for this visit.  Gen: resting comfortably, no acute distress HEENT: no scleral icterus, pupils equal round and reactive, no palptable cervical adenopathy,  CV Resp: Clear to auscultation bilaterally GI: abdomen is soft, non-tender, non-distended, normal bowel sounds, no hepatosplenomegaly MSK: extremities are warm, no edema.  Skin: warm, no rash Neuro:  no focal deficits Psych: appropriate affect   Diagnostic Studies 06/2014 Cath FINDINGS: Hemodynamics:  Central Aortic Pressure / Mean: 108/62/83 mmHg  Left Ventricular Pressure / LVEDP: 107/7/17 mmHg  Left Ventriculography:  EF:~45% %  Wall Motion:global Hypokinesis  Coronary Anatomy:  Dominance: Right  Left Main: Large caliber, long trunk that trifurcates into the LAD, Circumflex & Ramus Intermedius. Angiographically normal. ITG:PQDIYM as a large caliber vessel that tapers into a relatively small caliber (~1.5-2.0 mm) vessel distally. It wraps the apex, perfusing the distal 1/3 of the infero-apex. The distal vessel has tandem 60&70-80% focal lesions prior to the apex.  D1:Very small caliber bifurcating vessel with proximal ~70% stenosis.  Left Circumflex:Begins as a large caliber vessel that also tapers to a small to moderate caliber vessel in the AV Groove where it gives off a small caliber OM1  Skylar Priest and is 100% occluded just beyond that point.   Post-PTCA: beyond the occluded AV Groove Circumflex, there is a small to moderate caliber LPL Adelie Croswell.  Ramus intermedius:Large caliber vessel that bifurcates in the mid-vessel into to small to moderate caliber branches that both reach the apex.   RCA: Large caliber, dominant vessel with diffuse ~20-40% lesions, but no obstructive disease. It bifurcates distally in to a small caliber, short PDA and Right Posterior AV Groove [place] (RPAV) branches. RPAV gives off several very small banches.  After reviewing the initial angiography, the culprit lesion was thought to be the occluded distal-AV Groove circumflesx. Preparation were made to proceed with PTCA on this lesion.  Percutaneous Coronary Intervention:  Guide: 6 Fr CLS 3.5 -- very difficult guide support, catheter moved &disengaged with patient movement Guidewire: BMW (advanced initially in to an atrial Orest Dygert for support & balloon advanced into proximal Circumflex allowing for further advancmemt of the  wire beyond the culprit lesion. Predilation Balloon #1: Emerge 1.5 mm x 12 mm; Used to assist with wire support to advance guidewire ? 6 Atm x 30 Sec, 8 Atm x 30 Sec ? Post inflation revealed a small to moderate follow-on circumflex with LPL Jillian Warth.  ? Due to the relatively small sized distal vessel, the decision was to perform PTCA only. Balloon #2: Euphora 2.0 mm x 12 mm;  ? 8 Atm x 90 Sec x 2 with IC NTG - no recoil ? Final Diameter: ~2.0 mm  Post deployment angiography in multiple views, with and without guidewire in place revealed excellent stent deployment and lesion coverage. There was no evidence of dissection or perforation.  POST-OPERATIVE DIAGNOSIS:   Severe 2 vessel disease in small caliber vessel - distal AV Groove Circumflex 100% and distal LAD ~60&80% lesions (1.5 mm vessel)  Successful PTCA of the occluded AV Groove Circumflex with Stent-like  result.  Moderately reduced LVEF by LV Gram  Normal LVEDP   06/2014 Echo Study Conclusions  - Left ventricle: The cavity size was normal. Wall thickness was normal. Systolic function was normal. The estimated ejection fraction was in the range of 50% to 55%. Inferior hypokinesis. Doppler parameters are consistent with abnormal left ventricular relaxation (grade 1 diastolic dysfunction). The E/e&' ratio is 15, suggesting borderline elevated LV filling pressure. - Aortic valve: Trileaflet. Sclerosis without stenosis. There was no regurgitation. - Left atrium: The atrium was normal in size. - Tricuspid valve: There was mild regurgitation. - Pulmonary arteries: PA peak pressure: 33 mm Hg (S).  Impressions:  - LVEF 50-55%, inferior hypokinesis, mild TR, top normal RVSP, diastolic dysfunction with borderline elevated LV filling pressure.   11/2016 echo Study Conclusions  - Left ventricle: The cavity size was normal. Wall thickness was normal. Systolic function was mildly to moderately reduced. The estimated ejection fraction was in the range of 40% to 45%. There is akinesis of the basal-midinferior and inferoseptal myocardium. Doppler parameters are consistent with abnormal left ventricular relaxation (grade 1 diastolic dysfunction). - Aortic valve: Trileaflet; mildly thickened, mildly calcified leaflets. There was trivial regurgitation.  Impressions:  - EF is mildly reduced when compared to prior.   11/2016 cath  There is mild left ventricular systolic dysfunction.  The left ventricular ejection fraction is 45-50% by visual estimate.  Mid RCA-2 lesion, 25 %stenosed.  Patent mid RCA stent.  Ost 2nd Diag to 2nd Diag lesion, 80 %stenosed. Unchanged from prior.  Dist LAD lesion, 60 %stenosed. Unchanged from prior.  Mid Cx to Dist Cx lesion, 100 %stenosed. This lesion was treated with balloon angioplasty with a 2.0 balloon. Several  inflations were performed.  Post intervention, there is a 0% residual stenosis. The vessel was too small to be stented. There is also significant tortuosity in the proximal vessel.  LV end diastolic pressure is mildly elevated.  Initially, Brilinta was given for antiplatelet therapy in the Cath Lab. Since he did not receive a stent, we will transition him to clopidogrel. He needs aggressive secondary prevention including lipid-lowering therapy. He has been intolerant to statins in the past. Would need to consider PCS K9 inhibitor along with other secondary prevention. I anticipate he'll be here 2 days in the hospital. Echocardiogram would be reasonable to check ejection fraction and mitral apparatus.  LAD and diagonal disease was not addressed since they appeared unchanged angiographically. If he has refractory angina, could address the LAD lesion at a later time.  Jan 2018 AAA [place] No AAA  Jan 2019 nuclear stress  No diagnostic ST segment changes. Occasional PVCs noted throughout the study. Hypertensive response. Technically low risk Duke treadmill score 4.5.  Blood pressure demonstrated a hypertensive response to exercise.  Small, moderate intensity, partially reversible mid to apical anterior defect consistent with ischemia.  This is a high risk study predominantly based on calculated LVEF. Consider echocardiogram for further confirmation.  Nuclear stress EF: 25%.   02/2018 echo Study Conclusions  - Left ventricle: Mid and basal inferior wall hypokinesis. The cavity size was mildly dilated. Wall thickness was normal. Systolic function was mildly to moderately reduced. The estimated ejection fraction was in the range of 40% to 45%. - Aortic valve: There was trivial regurgitation. Valve area (VTI): 1.51 cm^2. Valve area (Vmax): 1.75 cm^2. Valve area (Vmean): 1.64 cm^2. - Atrial septum: No defect or patent foramen ovale was identified. - Pulmonary arteries: PA  peak pressure: 37 mm Hg (S).    Assessment and Plan  1. CAD/ICM -no symptoms, recent cardiac testing as reported above due to asymptomatic EKG changes - continue current medical therapy.   2. HTN -did not tolerate norvasc - bp remains elevated - limited options due to his decreased renal function. Start hydralazine 65m tid, nursing visit for bp check in 2 weeks.   3. Hyperlipidemia -intolerant to statins. COntinue zetia, request labs from pcp  4. PAD - has appt coming up with vascular - continue medical therapy      JArnoldo Lenis M.D., F.A.C.C.

## 2019-07-09 ENCOUNTER — Ambulatory Visit (HOSPITAL_COMMUNITY)
Admission: RE | Admit: 2019-07-09 | Discharge: 2019-07-09 | Disposition: A | Discharge status: Home / Self Care | Payer: Medicare HMO | Attending: Surgery | Admitting: Surgery

## 2019-07-09 ENCOUNTER — Other Ambulatory Visit: Payer: Self-pay

## 2019-07-09 ENCOUNTER — Encounter (HOSPITAL_COMMUNITY)
Admission: RE | Disposition: A | Discharge status: Home / Self Care | Payer: Self-pay | Source: Home / Self Care | Attending: Surgery

## 2019-07-09 ENCOUNTER — Encounter (HOSPITAL_COMMUNITY): Payer: Self-pay | Admitting: Surgery

## 2019-07-09 DIAGNOSIS — E785 Hyperlipidemia, unspecified: Secondary | ICD-10-CM | POA: Insufficient documentation

## 2019-07-09 DIAGNOSIS — I779 Disorder of arteries and arterioles, unspecified: Secondary | ICD-10-CM | POA: Diagnosis not present

## 2019-07-09 DIAGNOSIS — I70212 Atherosclerosis of native arteries of extremities with intermittent claudication, left leg: Secondary | ICD-10-CM | POA: Diagnosis not present

## 2019-07-09 DIAGNOSIS — H9190 Unspecified hearing loss, unspecified ear: Secondary | ICD-10-CM | POA: Insufficient documentation

## 2019-07-09 DIAGNOSIS — Z888 Allergy status to other drugs, medicaments and biological substances status: Secondary | ICD-10-CM | POA: Insufficient documentation

## 2019-07-09 DIAGNOSIS — E1151 Type 2 diabetes mellitus with diabetic peripheral angiopathy without gangrene: Secondary | ICD-10-CM | POA: Diagnosis not present

## 2019-07-09 DIAGNOSIS — I129 Hypertensive chronic kidney disease with stage 1 through stage 4 chronic kidney disease, or unspecified chronic kidney disease: Secondary | ICD-10-CM | POA: Insufficient documentation

## 2019-07-09 DIAGNOSIS — E669 Obesity, unspecified: Secondary | ICD-10-CM | POA: Insufficient documentation

## 2019-07-09 DIAGNOSIS — Z6832 Body mass index (BMI) 32.0-32.9, adult: Secondary | ICD-10-CM | POA: Insufficient documentation

## 2019-07-09 DIAGNOSIS — E1122 Type 2 diabetes mellitus with diabetic chronic kidney disease: Secondary | ICD-10-CM | POA: Diagnosis not present

## 2019-07-09 DIAGNOSIS — Z7982 Long term (current) use of aspirin: Secondary | ICD-10-CM | POA: Diagnosis not present

## 2019-07-09 DIAGNOSIS — Z7984 Long term (current) use of oral hypoglycemic drugs: Secondary | ICD-10-CM | POA: Diagnosis not present

## 2019-07-09 DIAGNOSIS — I2510 Atherosclerotic heart disease of native coronary artery without angina pectoris: Secondary | ICD-10-CM | POA: Diagnosis not present

## 2019-07-09 DIAGNOSIS — Z79899 Other long term (current) drug therapy: Secondary | ICD-10-CM | POA: Diagnosis not present

## 2019-07-09 DIAGNOSIS — Z87891 Personal history of nicotine dependence: Secondary | ICD-10-CM | POA: Diagnosis not present

## 2019-07-09 DIAGNOSIS — Z955 Presence of coronary angioplasty implant and graft: Secondary | ICD-10-CM | POA: Diagnosis not present

## 2019-07-09 DIAGNOSIS — M1990 Unspecified osteoarthritis, unspecified site: Secondary | ICD-10-CM | POA: Insufficient documentation

## 2019-07-09 DIAGNOSIS — I252 Old myocardial infarction: Secondary | ICD-10-CM | POA: Insufficient documentation

## 2019-07-09 DIAGNOSIS — N183 Chronic kidney disease, stage 3 (moderate): Secondary | ICD-10-CM | POA: Diagnosis not present

## 2019-07-09 HISTORY — PX: ABDOMINAL AORTOGRAM W/LOWER EXTREMITY: CATH118223

## 2019-07-09 HISTORY — PX: PERIPHERAL VASCULAR INTERVENTION: CATH118257

## 2019-07-09 LAB — POCT I-STAT, CHEM 8
BUN: 22 mg/dL (ref 8–23)
Calcium, Ion: 1.07 mmol/L — ABNORMAL LOW (ref 1.15–1.40)
Chloride: 105 mmol/L (ref 98–111)
Creatinine, Ser: 1.4 mg/dL — ABNORMAL HIGH (ref 0.61–1.24)
Glucose, Bld: 229 mg/dL — ABNORMAL HIGH (ref 70–99)
HCT: 46 % (ref 39.0–52.0)
Hemoglobin: 15.6 g/dL (ref 13.0–17.0)
Potassium: 6 mmol/L — ABNORMAL HIGH (ref 3.5–5.1)
Sodium: 138 mmol/L (ref 135–145)
TCO2: 25 mmol/L (ref 22–32)

## 2019-07-09 LAB — BASIC METABOLIC PANEL
Anion gap: 10 (ref 5–15)
BUN: 15 mg/dL (ref 8–23)
CO2: 26 mmol/L (ref 22–32)
Calcium: 9 mg/dL (ref 8.9–10.3)
Chloride: 103 mmol/L (ref 98–111)
Creatinine, Ser: 1.67 mg/dL — ABNORMAL HIGH (ref 0.61–1.24)
GFR calc Af Amer: 47 mL/min — ABNORMAL LOW (ref >60)
GFR calc non Af Amer: 40 mL/min — ABNORMAL LOW (ref >60)
Glucose, Bld: 225 mg/dL — ABNORMAL HIGH (ref 70–99)
Potassium: 5.8 mmol/L — ABNORMAL HIGH (ref 3.5–5.1)
Sodium: 139 mmol/L (ref 135–145)

## 2019-07-09 LAB — GLUCOSE, CAPILLARY: Glucose-Capillary: 209 mg/dL — ABNORMAL HIGH (ref 70–99)

## 2019-07-09 SURGERY — PERIPHERAL VASCULAR INTERVENTION
Anesthesia: LOCAL

## 2019-07-09 MED ORDER — LIDOCAINE HCL (PF) 1 % IJ SOLN
INTRAMUSCULAR | Status: AC
  Filled 2019-07-09: qty 30

## 2019-07-09 MED ORDER — HEPARIN (PORCINE) IN NACL 1000-0.9 UT/500ML-% IV SOLN
INTRAVENOUS | Status: DC | PRN
  Administered 2019-07-09: 500 mL

## 2019-07-09 MED ORDER — MIDAZOLAM HCL 2 MG/2ML IJ SOLN
INTRAMUSCULAR | Status: DC | PRN
  Administered 2019-07-09: 2 mg via INTRAVENOUS

## 2019-07-09 MED ORDER — IODIXANOL 320 MG/ML IV SOLN
INTRAVENOUS | Status: DC | PRN
  Administered 2019-07-09: 50 mL via INTRA_ARTERIAL

## 2019-07-09 MED ORDER — CLOPIDOGREL BISULFATE 75 MG PO TABS: 75.0000 mg | ORAL_TABLET | Freq: Every day | ORAL | Status: DC

## 2019-07-09 MED ORDER — ASPIRIN EC 81 MG PO TBEC: 81.0000 mg | DELAYED_RELEASE_TABLET | Freq: Every day | ORAL | Status: DC

## 2019-07-09 MED ORDER — CLOPIDOGREL BISULFATE 75 MG PO TABS: 75.0000 mg | ORAL_TABLET | Freq: Every day | ORAL | 11 refills | Status: DC

## 2019-07-09 MED ORDER — CLOPIDOGREL BISULFATE 300 MG PO TABS
ORAL_TABLET | ORAL | Status: DC | PRN
  Administered 2019-07-09: 300 mg via ORAL

## 2019-07-09 MED ORDER — FENTANYL CITRATE (PF) 100 MCG/2ML IJ SOLN
INTRAMUSCULAR | Status: DC | PRN
  Administered 2019-07-09: 50 ug via INTRAVENOUS

## 2019-07-09 MED ORDER — LIDOCAINE HCL (PF) 1 % IJ SOLN
INTRAMUSCULAR | Status: DC | PRN
  Administered 2019-07-09: 15 mL

## 2019-07-09 MED ORDER — CLOPIDOGREL BISULFATE 300 MG PO TABS: 300.0000 mg | ORAL_TABLET | Freq: Once | ORAL | Status: DC

## 2019-07-09 MED ORDER — HYDRALAZINE HCL 20 MG/ML IJ SOLN: 5.0000 mg | INTRAMUSCULAR | Status: DC | PRN

## 2019-07-09 MED ORDER — ACETAMINOPHEN 325 MG PO TABS: 650.0000 mg | ORAL_TABLET | ORAL | Status: DC | PRN

## 2019-07-09 MED ORDER — FENTANYL CITRATE (PF) 100 MCG/2ML IJ SOLN
INTRAMUSCULAR | Status: AC
  Filled 2019-07-09: qty 2

## 2019-07-09 MED ORDER — HEPARIN SODIUM (PORCINE) 1000 UNIT/ML IJ SOLN
INTRAMUSCULAR | Status: AC
  Filled 2019-07-09: qty 1

## 2019-07-09 MED ORDER — SODIUM CHLORIDE 0.9 % IV SOLN: 250.0000 mL | INTRAVENOUS | Status: DC | PRN

## 2019-07-09 MED ORDER — LABETALOL HCL 5 MG/ML IV SOLN: 10.0000 mg | INTRAVENOUS | Status: DC | PRN

## 2019-07-09 MED ORDER — OXYCODONE HCL 5 MG PO TABS: 5.0000 mg | ORAL_TABLET | ORAL | Status: DC | PRN

## 2019-07-09 MED ORDER — HEPARIN (PORCINE) IN NACL 1000-0.9 UT/500ML-% IV SOLN
INTRAVENOUS | Status: AC
  Filled 2019-07-09: qty 1000

## 2019-07-09 MED ORDER — SODIUM CHLORIDE 0.9 % WEIGHT BASED INFUSION: 1.0000 mL/kg/h | INTRAVENOUS | Status: DC

## 2019-07-09 MED ORDER — HEPARIN SODIUM (PORCINE) 1000 UNIT/ML IJ SOLN
INTRAMUSCULAR | Status: DC | PRN
  Administered 2019-07-09: 9000 [IU] via INTRAVENOUS

## 2019-07-09 MED ORDER — MIDAZOLAM HCL 2 MG/2ML IJ SOLN
INTRAMUSCULAR | Status: AC
  Filled 2019-07-09: qty 2

## 2019-07-09 MED ORDER — ONDANSETRON HCL 4 MG/2ML IJ SOLN: 4.0000 mg | Freq: Four times a day (QID) | INTRAMUSCULAR | Status: DC | PRN

## 2019-07-09 MED ORDER — SODIUM CHLORIDE 0.9% FLUSH: 3.0000 mL | INTRAVENOUS | Status: DC | PRN

## 2019-07-09 MED ORDER — SODIUM CHLORIDE 0.9 % IV SOLN
INTRAVENOUS | Status: DC
  Administered 2019-07-09: 08:00:00 via INTRAVENOUS

## 2019-07-09 MED ORDER — MORPHINE SULFATE (PF) 10 MG/ML IV SOLN: 2.0000 mg | INTRAVENOUS | Status: DC | PRN

## 2019-07-09 MED ORDER — SODIUM CHLORIDE 0.9% FLUSH: 3.0000 mL | Freq: Two times a day (BID) | INTRAVENOUS | Status: DC

## 2019-07-09 MED ORDER — CLOPIDOGREL BISULFATE 300 MG PO TABS
ORAL_TABLET | ORAL | Status: AC
  Filled 2019-07-09: qty 1

## 2019-07-09 SURGICAL SUPPLY — 26 items
BALLN MUSTANG 5X60X135 (BALLOONS) ×4
BALLN MUSTANG 6.0X40 135 (BALLOONS) ×4
BALLOON MUSTANG 5X60X135 (BALLOONS) ×1 IMPLANT
BALLOON MUSTANG 6.0X40 135 (BALLOONS) ×1 IMPLANT
CATH OMNI FLUSH 5F 65CM (CATHETERS) ×2 IMPLANT
CLOSURE MYNX CONTROL 6F/7F (Vascular Products) ×2 IMPLANT
DRAPE ZERO GRAVITY STERILE (DRAPES) ×2 IMPLANT
FILTER CO2 0.2 MICRON (VASCULAR PRODUCTS) ×4 IMPLANT
KIT ENCORE 26 ADVANTAGE (KITS) ×2 IMPLANT
KIT MICROPUNCTURE NIT STIFF (SHEATH) ×2 IMPLANT
KIT PV (KITS) ×4 IMPLANT
RESERVOIR CO2 (VASCULAR PRODUCTS) ×2 IMPLANT
SET FLUSH CO2 (MISCELLANEOUS) ×2 IMPLANT
SHEATH PINNACLE 5F 10CM (SHEATH) ×2 IMPLANT
SHEATH PINNACLE 6F 10CM (SHEATH) ×2 IMPLANT
SHEATH PINNACLE ST 6F 45CM (SHEATH) ×2 IMPLANT
SHEATH PROBE COVER 6X72 (BAG) ×2 IMPLANT
STENT ELUVIA 6X60X130 (Permanent Stent) ×2 IMPLANT
SYR MEDRAD MARK V 150ML (SYRINGE) ×2 IMPLANT
TAPE VIPERTRACK RADIOPAQ (MISCELLANEOUS) ×1 IMPLANT
TAPE VIPERTRACK RADIOPAQUE (MISCELLANEOUS) ×4
TRANSDUCER W/STOPCOCK (MISCELLANEOUS) ×4 IMPLANT
TRAY PV CATH (CUSTOM PROCEDURE TRAY) ×4 IMPLANT
WIRE BENTSON .035X145CM (WIRE) ×2 IMPLANT
WIRE HI TORQ VERSACORE 300 (WIRE) ×2 IMPLANT
WIRE TORQFLEX AUST .018X40CM (WIRE) ×2 IMPLANT

## 2019-07-09 NOTE — Interval H&P Note (Signed)
History and Physical Interval Note:  07/09/2019 9:32 AM  Timothy [name]  has presented today for surgery, with the diagnosis of PAD.  The various methods of treatment have been discussed with the patient and family. After consideration of risks, benefits and other options for treatment, the patient has consented to  Procedure(s): LOWER EXTREMITY ANGIOGRAPHY (Left) as a surgical intervention.  The patient's history has been reviewed, patient examined, no change in status, stable for surgery.  I have reviewed the patient's chart and labs.  Questions were answered to the patient's satisfaction.     Annamarie Major

## 2019-07-09 NOTE — Discharge Instructions (Signed)

## 2019-07-09 NOTE — Op Note (Signed)
    Patient name: Timothy [name] MRN: [id number] DOB: 09/26/1946 Sex: male  07/09/2019 Pre-operative Diagnosis: Recurrent left leg stenosis Post-operative diagnosis:  Same Surgeon:  Annamarie Major Procedure Performed:  1.  Ultrasound-guided access, right femoral artery  2.  Abdominal aortogram with CO2  3.  Left leg runoff  4.  Stent, left superficial femoral artery  5.  Conscious sedation (53 minutes)  6.  Closure device (Mynx)     Indications: Patient underwent atherectomy and drug-coated balloon angioplasty in 2019 for claudication.  His symptoms have returned.  Ultrasound identified recurrent stenosis.  He is here for further evaluation.  Procedure:  The patient was identified in the holding area and taken to room 8.  The patient was then placed supine on the table and prepped and draped in the usual sterile fashion.  A time out was called.  Conscious sedation was administered with the use of IV fentanyl and Versed under continuous physician and nurse monitoring.  Heart rate, blood pressure, and oxygen saturation were continuously monitored.  Total sedation time was 53 minutes.  Ultrasound was used to evaluate the right common femoral artery.  It was patent .  A digital ultrasound image was acquired.  A micropuncture needle was used to access the right common femoral artery under ultrasound guidance.  An 018 wire was advanced without resistance and a micropuncture sheath was placed.  The 018 wire was removed and a benson wire was placed.  The micropuncture sheath was exchanged for a 5 french sheath.  An omniflush catheter was advanced over the wire to the level of L-1.  An abdominal angiogram was obtained.  Next, using the omniflush catheter and a benson wire, the aortic bifurcation was crossed and the catheter was placed into theleft external iliac artery and left runoff was obtained.    Findings:   Aortogram: No significant renal artery stenosis was identified.  The infrarenal abdominal  aorta is widely patent.  Bilateral common and external iliac arteries are widely patent.  Right Lower Extremity: Not evaluated today as his ultrasound was normal and his creatinine was elevated  Left Lower Extremity: The left common femoral profundofemoral artery are widely patent.  The superficial femoral artery is patent.  The area just proximal to the adductor canal that was previously intervened on has a recurrent stenosis of approximately 70-80%.  The dominant runoff vessel is the posterior tibial.  Intervention: After the above images were acquired the decision was made to proceed with intervention.  Over a 035 versa core wire, a 6 French 45 cm sheath was advanced into the superficial femoral artery.  The patient was fully heparinized.  The wire was easily advanced across the lesion.  I elected to primarily stent the lesion.  A 6 x 60 Elluvia stent was deployed across the lesion.  I postdilated with a 5 and a 6 mm balloon.  Completion imaging revealed resolution of the stenosis and no change in runoff.  Minx device was used to close the groin.  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  #2  Single-vessel runoff via the posterior tibial artery     V. Annamarie Major, M.D., [facility] [place] Office: [phone] Pager:  [phone]

## 2019-07-11 ENCOUNTER — Telehealth: Payer: Self-pay | Admitting: Licensed Clinical Social Worker

## 2019-07-11 NOTE — Telephone Encounter (Signed)
CSW referred to assist patient with obtaining a BP cuff. CSW contacted patient to inform cuff will be delivered to home. Message left. CSW available as needed. Jackie Thelbert Gartin, LCSW, CCSW-MCS [phone]  

## 2019-07-19 ENCOUNTER — Other Ambulatory Visit: Payer: Self-pay | Admitting: Cardiology

## 2019-07-23 ENCOUNTER — Ambulatory Visit (INDEPENDENT_AMBULATORY_CARE_PROVIDER_SITE_OTHER): Payer: Medicare HMO | Admitting: Urology

## 2019-07-23 ENCOUNTER — Other Ambulatory Visit (HOSPITAL_COMMUNITY)
Admission: RE | Admit: 2019-07-23 | Discharge: 2019-07-23 | Disposition: A | Discharge status: Home / Self Care | Payer: Medicare HMO | Source: Ambulatory Visit | Attending: Urology | Admitting: Urology

## 2019-07-23 DIAGNOSIS — C678 Malignant neoplasm of overlapping sites of bladder: Secondary | ICD-10-CM | POA: Insufficient documentation

## 2019-07-23 DIAGNOSIS — C61 Malignant neoplasm of prostate: Secondary | ICD-10-CM | POA: Diagnosis not present

## 2019-07-23 DIAGNOSIS — R82998 Other abnormal findings in urine: Secondary | ICD-10-CM | POA: Diagnosis not present

## 2019-08-14 DIAGNOSIS — M5136 Other intervertebral disc degeneration, lumbar region: Secondary | ICD-10-CM | POA: Diagnosis not present

## 2019-08-14 DIAGNOSIS — Z6834 Body mass index (BMI) 34.0-34.9, adult: Secondary | ICD-10-CM | POA: Diagnosis not present

## 2019-08-14 DIAGNOSIS — M5410 Radiculopathy, site unspecified: Secondary | ICD-10-CM | POA: Diagnosis not present

## 2019-08-14 DIAGNOSIS — Z23 Encounter for immunization: Secondary | ICD-10-CM | POA: Diagnosis not present

## 2019-08-14 DIAGNOSIS — E6609 Other obesity due to excess calories: Secondary | ICD-10-CM | POA: Diagnosis not present

## 2019-08-16 ENCOUNTER — Other Ambulatory Visit: Payer: Self-pay

## 2019-08-16 ENCOUNTER — Telehealth (HOSPITAL_COMMUNITY): Payer: Self-pay | Admitting: Rehabilitation

## 2019-08-16 DIAGNOSIS — I779 Disorder of arteries and arterioles, unspecified: Secondary | ICD-10-CM

## 2019-08-16 NOTE — Telephone Encounter (Signed)

## 2019-08-19 ENCOUNTER — Ambulatory Visit (INDEPENDENT_AMBULATORY_CARE_PROVIDER_SITE_OTHER)
Admit: 2019-08-19 | Discharge: 2019-08-19 | Disposition: A | Discharge status: Home / Self Care | Payer: Medicare HMO | Attending: Family | Admitting: Family

## 2019-08-19 ENCOUNTER — Ambulatory Visit (INDEPENDENT_AMBULATORY_CARE_PROVIDER_SITE_OTHER): Payer: Self-pay | Admitting: Family

## 2019-08-19 ENCOUNTER — Other Ambulatory Visit: Payer: Self-pay

## 2019-08-19 ENCOUNTER — Encounter: Payer: Self-pay | Admitting: Family

## 2019-08-19 ENCOUNTER — Ambulatory Visit (HOSPITAL_COMMUNITY)
Admission: RE | Admit: 2019-08-19 | Discharge: 2019-08-19 | Disposition: A | Discharge status: Home / Self Care | Payer: Medicare HMO | Source: Ambulatory Visit | Attending: Family | Admitting: Family

## 2019-08-19 VITALS — BP 158/77 | HR 78 | Temp 98.1°F | Resp 14 | Ht 68.0 in | Wt 219.2 lb

## 2019-08-19 DIAGNOSIS — I779 Disorder of arteries and arterioles, unspecified: Secondary | ICD-10-CM | POA: Insufficient documentation

## 2019-08-19 DIAGNOSIS — Z9862 Peripheral vascular angioplasty status: Secondary | ICD-10-CM

## 2019-08-19 DIAGNOSIS — Z87891 Personal history of nicotine dependence: Secondary | ICD-10-CM

## 2019-08-19 DIAGNOSIS — E1151 Type 2 diabetes mellitus with diabetic peripheral angiopathy without gangrene: Secondary | ICD-10-CM

## 2019-08-19 NOTE — Progress Notes (Signed)
[facility] [place]   CC: Follow up peripheral artery occlusive disease  History of Present Illness Timothy [name] is a 73 y.o. male who is s/p stent placement in left superficial femoral artery on 07-09-19 by Dr. Trula Slade for recurrent left leg arterial stenosis.  He returns today for follow up.   He is also s/p atherectomyofleft superficial femoral and popliteal arteryand drug-coated balloon angioplastyofleft superficial femoral-popliteal artery on 08-28-18 by Dr. Trula Slade forlifestyle limiting claudication in his left leg.   He reports low back pain since about age 34, states he has discussed this with his PCP, and has seen a spine specialist, states injection in his back have helped. He has occasisonal pain in his low back with occasionalrightradiculopathy type symptoms, but only sometimes with walking. He has no non healing wounds.  He states that he no longer has any claudication type symptoms, he can walks as far as he wants with no difficulty. He walks around his yard every morning.   He denies any known history of stroke or TIA.  At today's visit he denies chest pain or dyspnea, denies abdominal pain.  He states he sees a nephrologist in [place], last serum creatinine and GFR result on file was 1.67 and 47 on 07-09-19.   He has CAD, had a NSTEMI in 2015, sees a cardiologist.  Diabetic:Yes, last A1C result on file was 7.2 on 07-30-18, he states his last A1C was 6.? Tobacco [id number] smoker, quitin 1994, smoked x 30years  Pt meds include: Statin :No, statins caused myalgias Betablocker:Yes ASA:Yes Other anticoagulants/antiplatelets:Plavix   Past Medical History:  Diagnosis Date  . Arthritis   . Bladder cancer ([place])   . CAD (coronary artery disease)    a. 06/2014: NSTEMI s/p PTCA of LCx, 60-80% residual in distal LAD, 70% prox small D1.   b. 02/06/15 NSTEMI  s/p successful PCI/DES to mid RCA  . Diabetes mellitus, type 2  ([place])   . Elevated PSA   . HLD (hyperlipidemia)   . HOH (hard of hearing)   . Hypertension   . NSTEMI (non-ST elevated myocardial infarction) [facility])     Social History Social History   Tobacco Use  . Smoking status: Former Smoker    Packs/day: 1.00    Years: 30.00    Pack years: 30.00    Types: Cigarettes    Quit date: 03/11/1993    Years since quitting: 26.4  . Smokeless tobacco: Never Used  Substance Use Topics  . Alcohol use: No    Alcohol/week: 0.0 standard drinks  . Drug use: No    Family History Family History  Problem Relation Age of Onset  . Cancer Child   . Asthma Mother     Past Surgical History:  Procedure Laterality Date  . ABDOMINAL AORTOGRAM W/LOWER EXTREMITY N/A 08/28/2018   Procedure: ABDOMINAL AORTOGRAM W/LOWER EXTREMITY;  Surgeon: Serafina Mitchell, MD;  Location: [place] [facility];  Service: Cardiovascular;  Laterality: N/A;  bilateral  . ABDOMINAL AORTOGRAM W/LOWER EXTREMITY N/A 07/09/2019   Procedure: ABDOMINAL AORTOGRAM W/LOWER EXTREMITY;  Surgeon: Serafina Mitchell, MD;  Location: [place] [facility];  Service: Cardiovascular;  Laterality: N/A;  unilateral Lt  . CARDIAC CATHETERIZATION  02/06/2015   Procedure: CORONARY STENT INTERVENTION;  Surgeon: [name], MD;  Location: [facility] CATH LAB;  Service: Cardiovascular;;  Mid RCA  . CARDIAC CATHETERIZATION N/A 11/11/2016   Procedure: Left Heart Cath and Coronary Angiography;  Surgeon: [name], MD;  Location: [place] [facility]  [facility];  Service: Cardiovascular;  Laterality: N/A;  . CARDIAC CATHETERIZATION N/A 11/11/2016   Procedure: Coronary Balloon Angioplasty;  Surgeon: [name], MD;  Location: [place] [facility];  Service: Cardiovascular;  Laterality: N/A;  . COLONOSCOPY N/A 01/14/2019   Procedure: COLONOSCOPY;  Surgeon: Rogene Houston, MD;  [facility];  Service: Endoscopy;  Laterality: N/A;  730  . CORONARY ANGIOPLASTY  11/11/2016   Mid Cx to Dist Cx lesion, 100 %stenosed. This  lesion was treated with balloon angioplasty with a 2.0 balloon  . CYSTOSCOPY W/ RETROGRADES N/A 05/05/2015   Procedure: CYSTOSCOPY WITH BILATERAL RETROGRADE PYELOGRAMS AND BLADDER BIOPSIES;  Surgeon: [name], MD;  Location: [facility];  Service: Urology;  Laterality: N/A;  . CYSTOSCOPY W/ RETROGRADES Bilateral 07/31/2018   Procedure: CYSTOSCOPY WITH BILATERAL RETROGRADE PYELOGRAM;  Surgeon: [name], MD;  Location: [facility];  Service: Urology;  Laterality: Bilateral;  . CYSTOSCOPY WITH BIOPSY N/A 07/31/2018   Procedure: CYSTOSCOPY WITH BIOPSY;  Surgeon: [name], MD;  Location: [facility];  Service: Urology;  Laterality: N/A;  . LEFT HEART CATHETERIZATION WITH CORONARY ANGIOGRAM N/A 06/17/2014   Procedure: LEFT HEART CATHETERIZATION WITH CORONARY ANGIOGRAM;  Surgeon: [name], MD;  Location: [facility] CATH LAB;  Service: Cardiovascular;  Laterality: N/A;  . LEFT HEART CATHETERIZATION WITH CORONARY ANGIOGRAM N/A 02/06/2015   Procedure: LEFT HEART CATHETERIZATION WITH CORONARY ANGIOGRAM;  Surgeon: [name], MD;  Location: [facility] C[facility] LAB;  Service: Cardiovascular;  Laterality: N/A;  . PERIPHERAL VASCULAR ATHERECTOMY  08/28/2018   Procedure: PERIPHERAL VASCULAR ATHERECTOMY;  Surgeon: Serafina Mitchell, MD;  Location: [place] [facility];  Service: Cardiovascular;;  Prox lt.SFA, Distal SFA  . PERIPHERAL VASCULAR INTERVENTION  07/09/2019   Procedure: PERIPHERAL VASCULAR INTERVENTION;  Surgeon: Serafina Mitchell, MD;  Location: [place] [facility];  Service: Cardiovascular;;  Lt. SFA  . POLYPECTOMY  01/14/2019   Procedure: POLYPECTOMY;  Surgeon: Rogene Houston, MD;  [facility];  Service: Endoscopy;;  colon  . PROSTATE BIOPSY N/A 03/31/2014   Procedure: BIOPSY TRANSRECTAL ULTRASONIC PROSTATE (TUBP);  Surgeon: [name], MD;  Location: [facility];  Service: Urology;  Laterality: N/A;  . TRANSURETHRAL RESECTION OF BLADDER TUMOR N/A 03/14/2013   Procedure:  TRANSURETHRAL RESECTION OF BLADDER TUMOR (TURBT);  Surgeon: [name], MD;  Location: [facility];  Service: Urology;  Laterality: N/A;  1 HR WITH MITOMYCIN INSTILLATION   . TRANSURETHRAL RESECTION OF BLADDER TUMOR N/A 09/30/2013   Procedure: TRANSURETHRAL RESECTION OF BLADDER TUMOR (TURBT);  Surgeon: [name], MD;  Location: [facility];  Service: Urology;  Laterality: N/A;  . TRANSURETHRAL RESECTION OF BLADDER TUMOR N/A 03/31/2014   Procedure: TRANSURETHRAL RESECTION OF BLADDER TUMOR (TURBT);  Surgeon: [name], MD;  Location: [facility];  Service: Urology;  Laterality: N/A;  . TRANSURETHRAL RESECTION OF BLADDER TUMOR N/A 05/31/2015   Procedure: Cystoscopy with clott evacuation and fulgeration of bleeders, retrograde pylegram;  Surgeon: [name], MD;  Location: [facility];  Service: Urology;  Laterality: N/A;    Allergies  Allergen Reactions  . Lipitor [Atorvastatin] Other (See Comments)    Myalgia  . Other Other (See Comments)    All Cholesterol Meds - causes muscle and joint pain  . Pravastatin Other (See Comments)    Myalgia    Current Outpatient Medications  Medication Sig Dispense Refill  . acetaminophen (TYLENOL) 500 MG tablet Take 500 mg by mouth every 6 (six) hours as  needed (pain).    Marland Kitchen aspirin 81 MG chewable tablet Chew 1 tablet (81 mg total) by mouth daily. (Patient taking differently: Chew 81 mg by mouth daily after breakfast. )    . Cholecalciferol 25 MCG (1000 UT) tablet Take 1,000 Units by mouth 2 (two) times a week.     . clopidogrel (PLAVIX) 75 MG tablet Take 1 tablet (75 mg total) by mouth daily. 30 tablet 11  . hydrALAZINE (APRESOLINE) 25 MG tablet TAKE 1 TABLET BY MOUTH THREE TIMES A DAY (Patient taking differently: Take 25 mg by mouth 3 (three) times daily. ) 270 tablet 3  . metoprolol succinate (TOPROL-XL) 25 MG 24 hr tablet TAKE 3 TABLES TOTAL DAILY 270 tablet 0  . Omega-3 Fatty Acids (FISH  OIL PO) Take 1 capsule by mouth daily.    . sitaGLIPtin (JANUVIA) 100 MG tablet Take 100 mg by mouth every morning. (1100)     No current facility-administered medications for this visit.     ROS: See HPI for pertinent positives and negatives.   Physical Examination  Vitals:   08/19/19 1400  BP: (!) 158/77  Pulse: 78  Resp: 14  Temp: 98.1 F (36.7 C)  TempSrc: Temporal  SpO2: 98%  Weight: 219 lb 3.2 oz (99.4 kg)  Height: 5\' 8"  (1.727 m)   Body mass index is 33.33 kg/m.  General: A&O x 3, WDWN, obese male. Gait: normal HEENT: No gross abnormalities.  Pulmonary: Respirations are non labored, CTAB, good air movement in all fields Cardiac: regular rhythm, no detected murmur.      Radial pulses are 2+ palpable bilaterally   Adominal aortic pulse is not palpable                         VASCULAR EXAM: Extremities without ischemic changes, without Gangrene; without open wounds.                                                                                                          LE Pulses Right Left       FEMORAL   palpable   palpable        POPLITEAL  not palpable   not palpable       POSTERIOR TIBIAL   palpable    palpable        DORSALIS PEDIS      ANTERIOR TIBIAL  palpable   palpable    Abdomen: soft, NT, no palpable masses. Skin: no rashes, no cellulitis, no ulcers noted. Musculoskeletal: no muscle wasting or atrophy.  Neurologic: A&O X 3; appropriate affect, Sensation is normal; MOTOR FUNCTION:  moving all extremities equally, motor strength 5/5 throughout. Speech is fluent/normal. CN 2-12 intact. Psychiatric: Thought content is normal, mood appropriate for clinical situation.     ASSESSMENT: Timothy [name] is a 73 y.o. male who is s/p stent placement in left superficial femoral artery on 07-09-19 by Dr. Trula Slade for recurrent left leg arterial stenosis.  He is also s/p atherectomyofleft superficial femoral and popliteal arteryand drug-coated balloon  angioplastyofleft superficial femoral-popliteal artery on 08-28-18 by Dr. Trula Slade forlifestyle limiting claudication in his left leg.   He no longer has claudication in either leg, can walks as far as he wants, walks in his yard every morning. There are no signs of ischemia in his feet or legs.   ABI's today are normal bilaterally with all triphasic waveforms. Left LE arterial duplex shows a patent stent with no evidence of stenosis in the mid left superficial femoral artery.      DATA  Left LE Arterial Duplex (08-19-19): LEFT      PSV cm/sRatioStenosisWaveform Comments +----------+--------+-----+--------+---------+--------+ CFA Distal154                  triphasic         +----------+--------+-----+--------+---------+--------+ DFA       55                   biphasic          +----------+--------+-----+--------+---------+--------+ SFA Prox  138                  biphasic          +----------+--------+-----+--------+---------+--------+ SFA Mid   50                   biphasic          +----------+--------+-----+--------+---------+--------+ SFA Distal103                  biphasic          +----------+--------+-----+--------+---------+--------+ POP Prox  70                   biphasic          +----------+--------+-----+--------+---------+--------+ POP Distal88                   biphasic          +----------+--------+-----+--------+---------+--------+ ATA Distal19                   biphasic          +----------+--------+-----+--------+---------+--------+ PTA Distal66                   biphasic          +----------+--------+-----+--------+---------+--------+ Left Stent(s): +-----------------------+--------+--------+--------+--------+ mid superficial femoralPSV cm/sStenosisWaveformComments +-----------------------+--------+--------+--------+--------+ Prox to Stent          86              biphasic          +-----------------------+--------+--------+--------+--------+ Proximal Stent         67              biphasic         +-----------------------+--------+--------+--------+--------+ Mid Stent              55              biphasic         +-----------------------+--------+--------+--------+--------+ Distal Stent           57              biphasic         +-----------------------+--------+--------+--------+--------+ Distal to Stent        75              biphasic         +-----------------------+--------+--------+--------+--------+ Patent stent with no evidence of stenosis. Summary: Left: Near normal examination. Patent stent with no evidence of stenosis in the  mid left superficial femoral artery.   ABI (Date: 08/19/2019): ABI Findings: +---------+------------------+-----+---------+--------+ Right    Rt Pressure (mmHg)IndexWaveform Comment  +---------+------------------+-----+---------+--------+ Brachial 171                                      +---------+------------------+-----+---------+--------+ ATA      192               1.12 triphasic         +---------+------------------+-----+---------+--------+ PTA      204               1.19 triphasic         +---------+------------------+-----+---------+--------+ Great Toe114               0.67                   +---------+------------------+-----+---------+--------+  +---------+------------------+-----+---------+-------+ Left     Lt Pressure (mmHg)IndexWaveform Comment +---------+------------------+-----+---------+-------+ Brachial 164                                     +---------+------------------+-----+---------+-------+ ATA      177               1.04 triphasic        +---------+------------------+-----+---------+-------+ PTA      191               1.12 triphasic        +---------+------------------+-----+---------+-------+ Great Toe126                0.74                  +---------+------------------+-----+---------+-------+  +-------+-----------+-----------+------------+------------+ ABI/TBIToday's ABIToday's TBIPrevious ABIPrevious TBI +-------+-----------+-----------+------------+------------+ Right  1.19       0.67       1.11        0.70         +-------+-----------+-----------+------------+------------+ Left   1.12       0.74       0.93        0.50         +-------+-----------+-----------+------------+------------+ Right ABIs appear essentially unchanged compared to prior study on 06/20/2019. Left ABIs appear increased compared to prior study on 06/20/2019. Summary: Right: Resting right ankle-brachial index is within normal range. No evidence of significant right lower extremity arterial disease. RT great toe pressure = 114 mmHg. Left: Resting left ankle-brachial index is within normal range. No evidence of significant left lower extremity arterial disease. The left toe-brachial index is normal. LT Great toe pressure = 126 mmHg.    PLAN:  Based on the patient's vascular studies and examination, pt will return to clinic in 3 months with left LE arterial duplex and ABI's I advised him to notify us if he develops concerns re the circulation in his feet or legs.   Continue walking at least 30 minutes daily.  I discussed in depth with the patient the nature of atherosclerosis, and emphasized the importance of maximal medical management including strict control of blood pressure, blood glucose, and lipid levels, obtaining regular exercise, and continued cessation of smoking.  The patient is aware that without maximal medical management the underlying atherosclerotic disease process will progress, limiting the benefit of any interventions.  The patient was given information about PAD including signs, symptoms, treatment, what symptoms should prompt  the patient to seek immediate medical care, and risk reduction  measures to take.  Clemon Chambers, RN, MSN, FNP-C [facility] Phone: [phone]  Clinic MD: Trula Slade  08/19/19 2:36 PM

## 2019-08-19 NOTE — Patient Instructions (Signed)
Peripheral Vascular Disease  Peripheral vascular disease (PVD) is a disease of the blood vessels that are not part of your heart and brain. A simple term for PVD is poor circulation. In most cases, PVD narrows the blood vessels that carry blood from your heart to the rest of your body. This can reduce the supply of blood to your arms, legs, and internal organs, like your stomach or kidneys. However, PVD most often affects a person's lower legs and feet. Without treatment, PVD tends to get worse. PVD can also lead to acute ischemic limb. This is when an arm or leg suddenly cannot get enough blood. This is a medical emergency. Follow these instructions at home: Lifestyle  Do not use any products that contain nicotine or tobacco, such as cigarettes and e-cigarettes. If you need help quitting, ask your doctor.  Lose weight if you are overweight. Or, stay at a healthy weight as told by your doctor.  Eat a diet that is low in fat and cholesterol. If you need help, ask your doctor.  Exercise regularly. Ask your doctor for activities that are right for you. General instructions  Take over-the-counter and prescription medicines only as told by your doctor.  Take good care of your feet: ? Wear comfortable shoes that fit well. ? Check your feet often for any cuts or sores.  Keep all follow-up visits as told by your doctor This is important. Contact a doctor if:  You have cramps in your legs when you walk.  You have leg pain when you are at rest.  You have coldness in a leg or foot.  Your skin changes.  You are unable to get or have an erection (erectile dysfunction).  You have cuts or sores on your feet that do not heal. Get help right away if:  Your arm or leg turns cold, numb, and blue.  Your arms or legs become red, warm, swollen, painful, or numb.  You have chest pain.  You have trouble breathing.  You suddenly have weakness in your face, arm, or leg.  You become very  confused or you cannot speak.  You suddenly have a very bad headache.  You suddenly cannot see. Summary  Peripheral vascular disease (PVD) is a disease of the blood vessels.  A simple term for PVD is poor circulation. Without treatment, PVD tends to get worse.  Treatment may include exercise, low fat and low cholesterol diet, and quitting smoking. This information is not intended to replace advice given to you by your health care provider. Make sure you discuss any questions you have with your health care provider. Document Released: 02/15/2010 Document Revised: 11/03/2017 Document Reviewed: 12/29/2016 Elsevier Patient Education  2020 [employer]  

## 2019-08-27 DIAGNOSIS — E119 Type 2 diabetes mellitus without complications: Secondary | ICD-10-CM | POA: Diagnosis not present

## 2019-09-17 DIAGNOSIS — R69 Illness, unspecified: Secondary | ICD-10-CM | POA: Diagnosis not present

## 2019-09-27 DIAGNOSIS — E7849 Other hyperlipidemia: Secondary | ICD-10-CM | POA: Diagnosis not present

## 2019-09-27 DIAGNOSIS — Z6834 Body mass index (BMI) 34.0-34.9, adult: Secondary | ICD-10-CM | POA: Diagnosis not present

## 2019-09-27 DIAGNOSIS — I2510 Atherosclerotic heart disease of native coronary artery without angina pectoris: Secondary | ICD-10-CM | POA: Diagnosis not present

## 2019-09-27 DIAGNOSIS — E1129 Type 2 diabetes mellitus with other diabetic kidney complication: Secondary | ICD-10-CM | POA: Diagnosis not present

## 2019-09-27 DIAGNOSIS — I10 Essential (primary) hypertension: Secondary | ICD-10-CM | POA: Diagnosis not present

## 2019-09-27 DIAGNOSIS — M1991 Primary osteoarthritis, unspecified site: Secondary | ICD-10-CM | POA: Diagnosis not present

## 2019-10-24 ENCOUNTER — Other Ambulatory Visit: Payer: Self-pay | Admitting: Cardiology

## 2019-11-15 ENCOUNTER — Other Ambulatory Visit: Payer: Self-pay

## 2019-11-15 DIAGNOSIS — I779 Disorder of arteries and arterioles, unspecified: Secondary | ICD-10-CM

## 2019-11-18 ENCOUNTER — Ambulatory Visit: Payer: Medicare HMO | Admitting: Family

## 2019-11-18 ENCOUNTER — Encounter (HOSPITAL_COMMUNITY): Payer: Medicare HMO

## 2019-11-20 ENCOUNTER — Encounter: Payer: Self-pay | Admitting: Family

## 2019-11-21 DIAGNOSIS — R69 Illness, unspecified: Secondary | ICD-10-CM | POA: Diagnosis not present

## 2019-11-22 ENCOUNTER — Other Ambulatory Visit: Payer: Self-pay

## 2019-11-22 DIAGNOSIS — C61 Malignant neoplasm of prostate: Secondary | ICD-10-CM

## 2019-11-22 DIAGNOSIS — C679 Malignant neoplasm of bladder, unspecified: Secondary | ICD-10-CM

## 2019-12-23 ENCOUNTER — Encounter: Payer: Self-pay | Admitting: Urology

## 2019-12-31 ENCOUNTER — Telehealth (HOSPITAL_COMMUNITY): Payer: Self-pay

## 2019-12-31 NOTE — Telephone Encounter (Signed)

## 2020-01-01 ENCOUNTER — Ambulatory Visit (INDEPENDENT_AMBULATORY_CARE_PROVIDER_SITE_OTHER): Payer: Medicare HMO | Admitting: Physician Assistant

## 2020-01-01 ENCOUNTER — Other Ambulatory Visit: Payer: Self-pay

## 2020-01-01 ENCOUNTER — Ambulatory Visit (HOSPITAL_COMMUNITY)
Admission: RE | Admit: 2020-01-01 | Discharge: 2020-01-01 | Disposition: A | Discharge status: Home / Self Care | Payer: Medicare HMO | Source: Ambulatory Visit | Attending: Vascular Surgery | Admitting: Vascular Surgery

## 2020-01-01 ENCOUNTER — Ambulatory Visit (INDEPENDENT_AMBULATORY_CARE_PROVIDER_SITE_OTHER)
Admission: RE | Admit: 2020-01-01 | Discharge: 2020-01-01 | Disposition: A | Discharge status: Home / Self Care | Payer: Medicare HMO | Source: Ambulatory Visit | Attending: Vascular Surgery | Admitting: Vascular Surgery

## 2020-01-01 DIAGNOSIS — I739 Peripheral vascular disease, unspecified: Secondary | ICD-10-CM | POA: Diagnosis not present

## 2020-01-01 DIAGNOSIS — I779 Disorder of arteries and arterioles, unspecified: Secondary | ICD-10-CM

## 2020-01-01 NOTE — Progress Notes (Signed)
Established Intermittent Claudication   History of Present Illness   Timothy [name] is a 74 y.o. (06/27/1946) male who presents to go over vascular studies related to peripheral arterial disease.  He underwent atherectomy and drug-coated balloon angioplasty of left SFA in 2019 for claudication.  He then developed recurrent stenosis with return of lifestyle limiting claudication and underwent stenting of left SFA in August 2020 by Dr. Trula Slade.  Patient denies any symptoms of claudication, rest pain, or nonhealing wounds of bilateral lower extremities.  He is taking his aspirin and Plavix daily.  He has a statin allergy.  He is a former smoker.  He is following regularly with his PCP for management of type 2 diabetes mellitus, hypertension, and hyperlipidemia.  The patient's PMH, PSH, SH, and FamHx were reviewed and are unchanged from prior visit.  Current Outpatient Medications  Medication Sig Dispense Refill  . acetaminophen (TYLENOL) 500 MG tablet Take 500 mg by mouth every 6 (six) hours as needed (pain).    Marland Kitchen aspirin 81 MG chewable tablet Chew 1 tablet (81 mg total) by mouth daily. (Patient taking differently: Chew 81 mg by mouth daily after breakfast. )    . Cholecalciferol 25 MCG (1000 UT) tablet Take 1,000 Units by mouth 2 (two) times a week.     . clopidogrel (PLAVIX) 75 MG tablet Take 1 tablet (75 mg total) by mouth daily. 30 tablet 11  . glimepiride (AMARYL) 2 MG tablet     . hydrALAZINE (APRESOLINE) 25 MG tablet TAKE 1 TABLET BY MOUTH THREE TIMES A DAY (Patient taking differently: Take 25 mg by mouth 3 (three) times daily. ) 270 tablet 3  . metoprolol succinate (TOPROL-XL) 25 MG 24 hr tablet TAKE 3 TABLES TOTAL DAILY 270 tablet 3  . Omega-3 Fatty Acids (FISH OIL PO) Take 1 capsule by mouth daily.    . sitaGLIPtin (JANUVIA) 100 MG tablet Take 100 mg by mouth every morning. (1100)    . traMADol-acetaminophen (ULTRACET) 37.5-325 MG tablet Take 1-2 tablets by mouth every 6 (six) hours  as needed.     No current facility-administered medications for this visit.    On ROS today: Low back pain, 10 system ROS otherwise negative   Physical Examination   Vitals:   01/01/20 1419  BP: (!) 171/81  Pulse: 66  Resp: 16  Temp: 97.8 F (36.6 C)  TempSrc: Temporal  SpO2: 97%  Weight: 224 lb (101.6 kg)  Height: 5\' 8"  (1.727 m)   Body mass index is 34.06 kg/m.  General Alert, O x 3, WD, NAD  Pulmonary Sym exp, good B air movt,  Cardiac RRR, Nl S1, S2  Vascular Vessel Right Left  Radial Palpable Palpable  PT Palpable Palpable  DP Not palpable Faintly palpable    Gastro- intestinal soft, non-distended, non-tender to palpation,   Musculo- skeletal M/S 5/5 throughout  , Extremities without ischemic changes  , No edema present, No visible varicosities , No Lipodermatosclerosis present  Neurologic Pain and light touch intact in extremities , Motor exam as listed above    Non-Invasive Vascular imaging   ABI   R:   ABI: 1.09,   PT: tri  DP: bi  TBI:  0.83  L:   ABI: 1.08,   PT: tri  DP: bi  TBI: 0.79  Left lower extremity arterial duplex demonstrates a patent SFA stents without any hemodynamically significant stenosis  Medical Decision Making   Timothy [name] is a 74 y.o. male who presents for vascular  studies related to PAD with history of left SFA stent   Bilateral lower extremities well-perfused based on ABIs and physical exam  Left SFA stent patent without any hemodynamically significant stenosis by duplex  Recheck left lower extremity arterial duplex and ABIs in 6 months; if unchanged at that time we can follow annually  Continue aspirin and Plavix daily  Continue to follow regularly with PCP for management of chronic medical conditions including type 2 diabetes mellitus, hypertension, and hyperlipidemia   Dagoberto Ligas PA-C [facility] [place] Office: [phone]  Clinic MD: Scot Dock

## 2020-01-02 ENCOUNTER — Other Ambulatory Visit: Payer: Self-pay | Admitting: *Deleted

## 2020-01-02 DIAGNOSIS — I70212 Atherosclerosis of native arteries of extremities with intermittent claudication, left leg: Secondary | ICD-10-CM

## 2020-01-02 DIAGNOSIS — I739 Peripheral vascular disease, unspecified: Secondary | ICD-10-CM

## 2020-01-07 DIAGNOSIS — Z0001 Encounter for general adult medical examination with abnormal findings: Secondary | ICD-10-CM | POA: Diagnosis not present

## 2020-01-07 DIAGNOSIS — I2510 Atherosclerotic heart disease of native coronary artery without angina pectoris: Secondary | ICD-10-CM | POA: Diagnosis not present

## 2020-01-07 DIAGNOSIS — E7849 Other hyperlipidemia: Secondary | ICD-10-CM | POA: Diagnosis not present

## 2020-01-07 DIAGNOSIS — I10 Essential (primary) hypertension: Secondary | ICD-10-CM | POA: Diagnosis not present

## 2020-01-07 DIAGNOSIS — I739 Peripheral vascular disease, unspecified: Secondary | ICD-10-CM | POA: Diagnosis not present

## 2020-01-07 DIAGNOSIS — E1129 Type 2 diabetes mellitus with other diabetic kidney complication: Secondary | ICD-10-CM | POA: Diagnosis not present

## 2020-01-07 DIAGNOSIS — Z1389 Encounter for screening for other disorder: Secondary | ICD-10-CM | POA: Diagnosis not present

## 2020-01-07 DIAGNOSIS — Z6835 Body mass index (BMI) 35.0-35.9, adult: Secondary | ICD-10-CM | POA: Diagnosis not present

## 2020-01-08 ENCOUNTER — Ambulatory Visit: Payer: Medicare HMO | Admitting: Cardiology

## 2020-01-08 ENCOUNTER — Encounter: Payer: Self-pay | Admitting: Surgery

## 2020-01-30 ENCOUNTER — Encounter: Payer: Self-pay | Admitting: Cardiology

## 2020-01-30 ENCOUNTER — Other Ambulatory Visit: Payer: Self-pay

## 2020-01-30 ENCOUNTER — Ambulatory Visit (INDEPENDENT_AMBULATORY_CARE_PROVIDER_SITE_OTHER): Payer: Medicare HMO | Admitting: Cardiology

## 2020-01-30 VITALS — BP 157/83 | HR 69 | Temp 98.9°F | Ht 68.0 in | Wt 226.0 lb

## 2020-01-30 DIAGNOSIS — I10 Essential (primary) hypertension: Secondary | ICD-10-CM

## 2020-01-30 DIAGNOSIS — I2510 Atherosclerotic heart disease of native coronary artery without angina pectoris: Secondary | ICD-10-CM

## 2020-01-30 DIAGNOSIS — E782 Mixed hyperlipidemia: Secondary | ICD-10-CM

## 2020-01-30 DIAGNOSIS — I255 Ischemic cardiomyopathy: Secondary | ICD-10-CM | POA: Diagnosis not present

## 2020-01-30 MED ORDER — HYDRALAZINE HCL 50 MG PO TABS: 50.0000 mg | ORAL_TABLET | Freq: Three times a day (TID) | ORAL | 3 refills | Status: DC

## 2020-01-30 NOTE — Progress Notes (Signed)
Clinical Summary [name] is a 74 y.o.male seen today for follow up of the medical problems.   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 Echo LVEF 50-55%, inferior hypokinesis, grade I diastolic dysfunction. -admit 11/2016 with NSTEMI. Cath as reported below. Received balloon angioplasty of mid to distal LCX, too small to be stented.  - echo 11/2016 LVEF 40-45%.   - last visit he was doing well without symptoms but was noted to have new inferior and lateral precordial TWIs, and lateral Qwaves - referred for stress test. Jan 2019 nuclear stress small moderate intensity partially reversible mid to apical anterior defect consistent with mild ischemia. LVEF 25% by nuclear, however echo 02/2018 showed LVEF stabel 40-45%.    - no recent chest pain - no SOB or DOE. - compliant with meds. Therapy limited due to renal dysfunction    2. HTN - norvasc that we started last visit caused foot pain, stopped and resolved. - compliant withmeds  3. Hyperlipidemia - intolerant to statins, has been on zetia only. He reports muscle aches on zetia, now off.  - labs followed by pcp  4. PAD - 07/2018 ABIs right 1.12, left 0.8 - followed by vascular. Has had left SFA stent.  - Jan 2021 normal ABIs, normal left SFA stent.    AAA screen: neg [place] 2018   Had first covid vaccine in [place]    Past Medical History:  Diagnosis Date  . Arthritis   . Bladder cancer ([place])   . CAD (coronary artery disease)    a. 06/2014: NSTEMI s/p PTCA of LCx, 60-80% residual in distal LAD, 70% prox small D1.   b. 02/06/15 NSTEMI  s/p successful PCI/DES to mid RCA  . Diabetes mellitus, type 2 ([place])   . Elevated PSA   . HLD (hyperlipidemia)   . HOH (hard of hearing)   . Hypertension   . NSTEMI (non-ST elevated myocardial infarction) ([facility])      Allergies  Allergen Reactions  . Lipitor [Atorvastatin] Other (See Comments)    Myalgia  . Other Other (See  Comments)    All Cholesterol Meds - causes muscle and joint pain  . Pravastatin Other (See Comments)    Myalgia     Current Outpatient Medications  Medication Sig Dispense Refill  . acetaminophen (TYLENOL) 500 MG tablet Take 500 mg by mouth every 6 (six) hours as needed (pain).    Marland Kitchen aspirin 81 MG chewable tablet Chew 1 tablet (81 mg total) by mouth daily. (Patient taking differently: Chew 81 mg by mouth daily after breakfast. )    . Cholecalciferol 25 MCG (1000 UT) tablet Take 1,000 Units by mouth 2 (two) times a week.     . clopidogrel (PLAVIX) 75 MG tablet Take 1 tablet (75 mg total) by mouth daily. 30 tablet 11  . glimepiride (AMARYL) 2 MG tablet     . hydrALAZINE (APRESOLINE) 25 MG tablet TAKE 1 TABLET BY MOUTH THREE TIMES A DAY (Patient taking differently: Take 25 mg by mouth 3 (three) times daily. ) 270 tablet 3  . metoprolol succinate (TOPROL-XL) 25 MG 24 hr tablet TAKE 3 TABLES TOTAL DAILY 270 tablet 3  . Omega-3 Fatty Acids (FISH OIL PO) Take 1 capsule by mouth daily.    . sitaGLIPtin (JANUVIA) 100 MG tablet Take 100 mg by mouth every morning. (1100)    . traMADol-acetaminophen (ULTRACET) 37.5-325 MG tablet Take 1-2 tablets by mouth every 6 (six) hours as needed.  No current facility-administered medications for this visit.     Past Surgical History:  Procedure Laterality Date  . ABDOMINAL AORTOGRAM W/LOWER EXTREMITY N/A 08/28/2018   Procedure: ABDOMINAL AORTOGRAM W/LOWER EXTREMITY;  Surgeon: Serafina Mitchell, MD;  Location: [facility];  Service: Cardiovascular;  Laterality: N/A;  bilateral  . ABDOMINAL AORTOGRAM W/LOWER EXTREMITY N/A 07/09/2019   Procedure: ABDOMINAL AORTOGRAM W/LOWER EXTREMITY;  Surgeon: Serafina Mitchell, MD;  Location: [facility];  Service: Cardiovascular;  Laterality: N/A;  unilateral Lt  . CARDIAC CATHETERIZATION  02/06/2015   Procedure: CORONARY STENT INTERVENTION;  Surgeon: [name], MD;  Location: [facility] CATH LAB;  Service:  Cardiovascular;;  Mid RCA  . CARDIAC CATHETERIZATION N/A 11/11/2016   Procedure: Left Heart Cath and Coronary Angiography;  Surgeon: [name], MD;  Location: [facility];  Service: Cardiovascular;  Laterality: N/A;  . CARDIAC CATHETERIZATION N/A 11/11/2016   Procedure: Coronary Balloon Angioplasty;  Surgeon: [name], MD;  Location: [facility];  Service: Cardiovascular;  Laterality: N/A;  . COLONOSCOPY N/A 01/14/2019   Procedure: COLONOSCOPY;  Surgeon: Rogene Houston, MD;  [facility];  Service: Endoscopy;  Laterality: N/A;  730  . CORONARY ANGIOPLASTY  11/11/2016   Mid Cx to Dist Cx lesion, 100 %stenosed. This lesion was treated with balloon angioplasty with a 2.0 balloon  . CYSTOSCOPY W/ RETROGRADES N/A 05/05/2015   Procedure: CYSTOSCOPY WITH BILATERAL RETROGRADE PYELOGRAMS AND BLADDER BIOPSIES;  Surgeon: [name], MD;  Location: [facility];  Service: Urology;  Laterality: N/A;  . CYSTOSCOPY W/ RETROGRADES Bilateral 07/31/2018   Procedure: CYSTOSCOPY WITH BILATERAL RETROGRADE PYELOGRAM;  Surgeon: [name], MD;  Location: [facility];  Service: Urology;  Laterality: Bilateral;  . CYSTOSCOPY WITH BIOPSY N/A 07/31/2018   Procedure: CYSTOSCOPY WITH BIOPSY;  Surgeon: [name], MD;  [facility]: [facility];  Service: Urology;  Laterality: N/A;  . LEFT HEART CATHETERIZATION WITH CORONARY ANGIOGRAM N/A 06/17/2014   Procedure: LEFT HEART CATHETERIZATION WITH CORONARY ANGIOGRAM;  Surgeon: [name], MD;  Location: [facility] CATH LAB;  Service: Cardiovascular;  Laterality: N/A;  . LEFT HEART CATHETERIZATION WITH CORONARY ANGIOGRAM N/A 02/06/2015   Procedure: LEFT HEART CATHETERIZATION WITH CORONARY ANGIOGRAM;  Surgeon: [name], MD;  Location: [facility] CATH LAB;  Service: Cardiovascular;  Laterality: N/A;  . PERIPHERAL VASCULAR ATHERECTOMY  08/28/2018   Procedure: PERIPHERAL VASCULAR ATHERECTOMY;  Surgeon: Serafina Mitchell, MD;  Location: [place]  [facility];  Service: Cardiovascular;;  Prox lt.SFA, Distal SFA  . PERIPHERAL VASCULAR INTERVENTION  07/09/2019   Procedure: PERIPHERAL VASCULAR INTERVENTION;  Surgeon: Serafina Mitchell, MD;  Location: [facility];  Service: Cardiovascular;;  Lt. SFA  . POLYPECTOMY  01/14/2019   Procedure: POLYPECTOMY;  Surgeon: Rogene Houston, MD;  [facility];  Service: Endoscopy;;  colon  . PROSTATE BIOPSY N/A 03/31/2014   Procedure: BIOPSY TRANSRECTAL ULTRASONIC PROSTATE (TUBP);  Surgeon: [name], MD;  Location: [facility];  Service: Urology;  Laterality: N/A;  . TRANSURETHRAL RESECTION OF BLADDER TUMOR N/A 03/14/2013   Procedure: TRANSURETHRAL RESECTION OF BLADDER TUMOR (TURBT);  Surgeon: [name], MD;  Location: [facility];  Service: Urology;  Laterality: N/A;  1 HR WITH MITOMYCIN INSTILLATION   . TRANSURETHRAL RESECTION OF BLADDER TUMOR N/A 09/30/2013   Procedure: TRANSURETHRAL RESECTION OF BLADDER TUMOR (TURBT);  Surgeon: [name], MD;  Location: [facility];  Service: Urology;  Laterality: N/A;  .  TRANSURETHRAL RESECTION OF BLADDER TUMOR N/A 03/31/2014   Procedure: TRANSURETHRAL RESECTION OF BLADDER TUMOR (TURBT);  Surgeon: [name], MD;  Location: [facility];  Service: Urology;  Laterality: N/A;  . TRANSURETHRAL RESECTION OF BLADDER TUMOR N/A 05/31/2015   Procedure: Cystoscopy with clott evacuation and fulgeration of bleeders, retrograde pylegram;  Surgeon: [name], MD;  Location: [facility];  Service: Urology;  Laterality: N/A;     Allergies  Allergen Reactions  . Lipitor [Atorvastatin] Other (See Comments)    Myalgia  . Other Other (See Comments)    All Cholesterol Meds - causes muscle and joint pain  . Pravastatin Other (See Comments)    Myalgia      Family History  Problem Relation Age of Onset  . Cancer Child   . Asthma Mother      Social History Mr. Hackenberg reports  that he quit smoking about 26 years ago. His smoking use included cigarettes. He has a 30.00 pack-year smoking history. He has never used smokeless tobacco. Mr. Genet reports no history of alcohol use.   Review of Systems CONSTITUTIONAL: No weight loss, fever, chills, weakness or fatigue.  HEENT: Eyes: No visual loss, blurred vision, double vision or yellow sclerae.No hearing loss, sneezing, congestion, runny nose or sore throat.  SKIN: No rash or itching.  CARDIOVASCULAR: per hpi RESPIRATORY: No shortness of breath, cough or sputum.  GASTROINTESTINAL: No anorexia, nausea, vomiting or diarrhea. No abdominal pain or blood.  GENITOURINARY: No burning on urination, no polyuria NEUROLOGICAL: No headache, dizziness, syncope, paralysis, ataxia, numbness or tingling in the extremities. No change in bowel or bladder control.  MUSCULOSKELETAL: No muscle, back pain, joint pain or stiffness.  LYMPHATICS: No enlarged nodes. No history of splenectomy.  PSYCHIATRIC: No history of depression or anxiety.  ENDOCRINOLOGIC: No reports of sweating, cold or heat intolerance. No polyuria or polydipsia.  Marland Kitchen   Physical Examination Vitals:   01/30/20 1005  BP: (!) 157/83  Pulse: 69  Temp: 98.9 F (37.2 C)  SpO2: 97%   Filed Weights   01/30/20 1005  Weight: 226 lb (102.5 kg)    Gen: resting comfortably, no acute distress HEENT: no scleral icterus, pupils equal round and reactive, no palptable cervical adenopathy,  CV: RRR, no m/r/g, no jvd Resp: Clear to auscultation bilaterally GI: abdomen is soft, non-tender, non-distended, normal bowel sounds, no hepatosplenomegaly MSK: extremities are warm, no edema.  Skin: warm, no rash Neuro:  no focal deficits Psych: appropriate affect   Diagnostic Studies  06/2014 Cath FINDINGS: Hemodynamics:  Central Aortic Pressure / Mean: 108/62/83 mmHg  Left Ventricular Pressure / LVEDP: 107/7/17 mmHg  Left Ventriculography:  EF:~45% %  Wall  Motion:global Hypokinesis  Coronary Anatomy:  Dominance: Right  Left Main: Large caliber, long trunk that trifurcates into the LAD, Circumflex & Ramus Intermedius. Angiographically normal. RFF:MBWGYK as a large caliber vessel that tapers into a relatively small caliber (~1.5-2.0 mm) vessel distally. It wraps the apex, perfusing the distal 1/3 of the infero-apex. The distal vessel has tandem 60&70-80% focal lesions prior to the apex.  D1:Very small caliber bifurcating vessel with proximal ~70% stenosis.  Left Circumflex:Begins as a large caliber vessel that also tapers to a small to moderate caliber vessel in the AV Groove where it gives off a small caliber OM1 Jvion Turgeon and is 100% occluded just beyond that point.   Post-PTCA: beyond the occluded AV Groove Circumflex, there is a small to moderate caliber LPL Ladarius Seubert.  Ramus intermedius:Large caliber vessel that bifurcates in  the mid-vessel into to small to moderate caliber branches that both reach the apex.   RCA: Large caliber, dominant vessel with diffuse ~20-40% lesions, but no obstructive disease. It bifurcates distally in to a small caliber, short PDA and Right Posterior AV Groove Yarelie Hams (RPAV) branches. RPAV gives off several very small banches.  After reviewing the initial angiography, the culprit lesion was thought to be the occluded distal-AV Groove circumflesx. Preparation were made to proceed with PTCA on this lesion.  Percutaneous Coronary Intervention:  Guide: 6 Fr CLS 3.5 -- very difficult guide support, catheter moved &disengaged with patient movement Guidewire: BMW (advanced initially in to an atrial Ian Cavey for support & balloon advanced into proximal Circumflex allowing for further advancmemt of the wire beyond the culprit lesion. Predilation Balloon #1: Emerge 1.5 mm x 12 mm; Used to assist with wire support to advance guidewire ? 6 Atm x 30 Sec, 8 Atm x 30 Sec ? Post inflation revealed a small to  moderate follow-on circumflex with LPL Ciji Boston.  ? Due to the relatively small sized distal vessel, the decision was to perform PTCA only. Balloon #2: Euphora 2.0 mm x 12 mm;  ? 8 Atm x 90 Sec x 2 with IC NTG - no recoil ? Final Diameter: ~2.0 mm  Post deployment angiography in multiple views, with and without guidewire in place revealed excellent stent deployment and lesion coverage. There was no evidence of dissection or perforation.  POST-OPERATIVE DIAGNOSIS:   Severe 2 vessel disease in small caliber vessel - distal AV Groove Circumflex 100% and distal LAD ~60&80% lesions (1.5 mm vessel)  Successful PTCA of the occluded AV Groove Circumflex with Stent-like result.  Moderately reduced LVEF by LV Gram  Normal LVEDP   06/2014 Echo Study Conclusions  - Left ventricle: The cavity size was normal. Wall thickness was normal. Systolic function was normal. The estimated ejection fraction was in the range of 50% to 55%. Inferior hypokinesis. Doppler parameters are consistent with abnormal left ventricular relaxation (grade 1 diastolic dysfunction). The E/e&' ratio is 15, suggesting borderline elevated LV filling pressure. - Aortic valve: Trileaflet. Sclerosis without stenosis. There was no regurgitation. - Left atrium: The atrium was normal in size. - Tricuspid valve: There was mild regurgitation. - Pulmonary arteries: PA peak pressure: 33 mm Hg (S).  Impressions:  - LVEF 50-55%, inferior hypokinesis, mild TR, top normal RVSP, diastolic dysfunction with borderline elevated LV filling pressure.   11/2016 echo Study Conclusions  - Left ventricle: The cavity size was normal. Wall thickness was normal. Systolic function was mildly to moderately reduced. The estimated ejection fraction was in the range of 40% to 45%. There is akinesis of the basal-midinferior and inferoseptal myocardium. Doppler parameters are consistent with abnormal left  ventricular relaxation (grade 1 diastolic dysfunction). - Aortic valve: Trileaflet; mildly thickened, mildly calcified leaflets. There was trivial regurgitation.  Impressions:  - EF is mildly reduced when compared to prior.   11/2016 cath  There is mild left ventricular systolic dysfunction.  The left ventricular ejection fraction is 45-50% by visual estimate.  Mid RCA-2 lesion, 25 %stenosed.  Patent mid RCA stent.  Ost 2nd Diag to 2nd Diag lesion, 80 %stenosed. Unchanged from prior.  Dist LAD lesion, 60 %stenosed. Unchanged from prior.  Mid Cx to Dist Cx lesion, 100 %stenosed. This lesion was treated with balloon angioplasty with a 2.0 balloon. Several inflations were performed.  Post intervention, there is a 0% residual stenosis. The vessel was too small to be stented. There is  also significant tortuosity in the proximal vessel.  LV end diastolic pressure is mildly elevated.  Initially, Brilinta was given for antiplatelet therapy in the Cath Lab. Since he did not receive a stent, we will transition him to clopidogrel. He needs aggressive secondary prevention including lipid-lowering therapy. He has been intolerant to statins in the past. Would need to consider PCS K9 inhibitor along with other secondary prevention. I anticipate he'll be here 2 days in the hospital. Echocardiogram would be reasonable to check ejection fraction and mitral apparatus.  LAD and diagonal disease was not addressed since they appeared unchanged angiographically. If he has refractory angina, could address the LAD lesion at a later time.  Jan 2018 AAA [place] No AAA   Jan 2019 nuclear stress  No diagnostic ST segment changes. Occasional PVCs noted throughout the study. Hypertensive response. Technically low risk Duke treadmill score 4.5.  Blood pressure demonstrated a hypertensive response to exercise.  Small, moderate intensity, partially reversible mid to apical anterior defect  consistent with ischemia.  This is a high risk study predominantly based on calculated LVEF. Consider echocardiogram for further confirmation.  Nuclear stress EF: 25%.   02/2018 echo Study Conclusions  - Left ventricle: Mid and basal inferior wall hypokinesis. The cavity size was mildly dilated. Wall thickness was normal. Systolic function was mildly to moderately reduced. The estimated ejection fraction was in the range of 40% to 45%. - Aortic valve: There was trivial regurgitation. Valve area (VTI): 1.51 cm^2. Valve area (Vmax): 1.75 cm^2. Valve area (Vmean): 1.64 cm^2. - Atrial septum: No defect or patent foramen ovale was identified. - Pulmonary arteries: PA peak pressure: 37 mm Hg (S).   Assessment and Plan   1. CAD/ICM -no symptoms, continue current meds  2. HTN -bp's above goal, increase hydral tin 72m tid  3. Hyperlipidemia -intolerant to statins. Since our last visit appears he stopped zetia due to muscle aches -obtain pcp labs, pending results may refer to lipid clinic to consider pcsk9 inhibitor  4. PAD - followed by vascular - has been on DAPT for prior SFA stent   F/u 1 month, recheck bp. Decide on plans for lipid management after reviewing pcp labs and notes.      JArnoldo Lenis M.D.

## 2020-01-30 NOTE — Patient Instructions (Signed)
Medication Instructions:  INCREASE HYDRALAZINE TO 50 MG THREE TIMES DAILY   Labwork: I WILL REQUEST LABS   Testing/Procedures: NONE  Follow-Up: Your physician recommends that you schedule a follow-up appointment in: 1 MONTH   Any Other Special Instructions Will Be Listed Below (If Applicable).     If you need a refill on your cardiac medications before your next appointment, please call your pharmacy.

## 2020-02-25 ENCOUNTER — Encounter: Payer: Self-pay | Admitting: Urology

## 2020-02-25 ENCOUNTER — Other Ambulatory Visit: Payer: Self-pay

## 2020-02-25 ENCOUNTER — Ambulatory Visit (INDEPENDENT_AMBULATORY_CARE_PROVIDER_SITE_OTHER): Payer: Medicare HMO | Admitting: Urology

## 2020-02-25 ENCOUNTER — Other Ambulatory Visit (HOSPITAL_COMMUNITY)
Admission: RE | Admit: 2020-02-25 | Discharge: 2020-02-25 | Disposition: A | Discharge status: Home / Self Care | Payer: Medicare HMO | Source: Ambulatory Visit | Attending: Urology | Admitting: Urology

## 2020-02-25 VITALS — BP 146/81 | HR 74 | Temp 97.3°F | Ht 68.0 in | Wt 224.0 lb

## 2020-02-25 DIAGNOSIS — C61 Malignant neoplasm of prostate: Secondary | ICD-10-CM | POA: Diagnosis not present

## 2020-02-25 DIAGNOSIS — C679 Malignant neoplasm of bladder, unspecified: Secondary | ICD-10-CM | POA: Insufficient documentation

## 2020-02-25 LAB — POCT URINALYSIS DIPSTICK
Bilirubin, UA: NEGATIVE
Blood, UA: NEGATIVE
Glucose, UA: NEGATIVE
Ketones, UA: NEGATIVE
Leukocytes, UA: NEGATIVE
Nitrite, UA: NEGATIVE
Protein, UA: NEGATIVE
Spec Grav, UA: 1.02 (ref 1.010–1.025)
Urobilinogen, UA: 0.2 E.U./dL
pH, UA: 5 (ref 5.0–8.0)

## 2020-02-25 MED ORDER — CIPROFLOXACIN HCL 500 MG PO TABS
500.0000 mg | ORAL_TABLET | Freq: Once | ORAL | Status: AC
  Administered 2020-02-25: 500 mg via ORAL

## 2020-02-25 NOTE — Addendum Note (Signed)
Addended by: Valentina Lucks on: 02/25/2020 04:02 PM   Modules accepted: Orders

## 2020-02-25 NOTE — Progress Notes (Signed)
Urological Symptom Review  Patient is experiencing the following symptoms: none   Review of Systems  Gastrointestinal (upper)  : Negative for upper GI symptoms  Gastrointestinal (lower) : Negative for lower GI symptoms  Constitutional : Negative for symptoms  Skin: Negative for skin symptoms  Eyes: Negative for eye symptoms  Ear/Nose/Throat : Negative for Ear/Nose/Throat symptoms  Hematologic/Lymphatic: Negative for Hematologic/Lymphatic symptoms  Cardiovascular : Negative for cardiovascular symptoms  Respiratory : Negative for respiratory symptoms  Endocrine: Negative for endocrine symptoms  Musculoskeletal: Back Pain  Neurological: Negative for neurological symptoms  Psychologic: Negative for psychiatric symptoms

## 2020-02-25 NOTE — Progress Notes (Signed)
H&P  Chief Complaint: BPH + Bladder Cancer  History of Present Illness:   3.23.2021: Here today for follow-up w/ cysto. He denies any voiding complaints or recent blood per urine.   IPSS Questionnaire (AUA-7): Over the past month.   1)  How often have you had a sensation of not emptying your bladder completely after you finish urinating?  3 - About half the time  2)  How often have you had to urinate again less than two hours after you finished urinating? 2 - Less than half the time  3)  How often have you found you stopped and started again several times when you urinated?  0 - Not at all  4) How difficult have you found it to postpone urination?  0 - Not at all  5) How often have you had a weak urinary stream?  0 - Not at all  6) How often have you had to push or strain to begin urination?  1 - Less than 1 time in 5  7) How many times did you most typically get up to urinate from the time you went to bed until the time you got up in the morning?  1 - 1 time  Total score:  0-7 mildly symptomatic   8-19 moderately symptomatic   20-35 severely symptomatic   Total: 7 QoL: 0  (below copied from [facility] records):  BPH:   Timothy [name] is a 74 year-old male established patient who is here for follow up regarding further evaluation of BPH and lower urinary tract symptoms.  Patient is currently treated with Alfuzosin for his symptoms. His symptoms have been stable over the last year.   He is on an alpha-blocker for BPH.   8.18.2020: He denies any changes in urinary pattern since last visit.   Bladder Cancer:  Initial TURBT on 4.10.2014. He had papillary, low-grade, NMIBC. He eventually was found to have recurrence within 6 months, and underwent repeat TURBT on 10.27.2014. Again, this was low-grade, NMIBC. Because of his recurrence in a short period of time, despite using mitomycin after his first and second treatments, he underwent BCG induction, which was completed in January, 2014.  He did have a significant inflammatory response to the mitomycin.   In April, 2015 at routine follow-up, he was found to have 2 recurrences, one approximately 1.5 and 1 approximately 1.0 cm. He underwent TURBT on 4.27.2015. This revealed high-grade NMIBC. He completed a second BCG induction course in early August, 2015. He subsequently underwent cystoscopy, bilateral retrogrades, and bladder biopsy on 5.3.2016. Biopsies of the bladder were negative. Retrograde studies were normal. He has had significant dysuria during his treatments. He has completed 3 maintenance BCG treatments, the last of which was 11.29.2016.   Because of the patient's significant side effects from his BCG i.e. dysuria, bladder pain, we stopped maintenance therapy after his 11.29.2016 dose.   8.26.2019: Because of positive cytologies, he underwent cystoscopy, bilateral retrogrades/renal washings, bladder barbotage and bladder biopsy. Bladder washings were negative, barbotage specimen negative, biopsies revealed only benign/inflammatory change.   10.8.2019: No gross hematuria. Dysuria from procedure has essentially resolved.   2.11.2020: Cysto--stable scars. Cytology negative.   8.18.2020: Cysto today. No recent gross hematuria or urinary complaints.   Prostate Cancer:  He is participating in active surveillance.   Because of a rising PSA, he underwent a concurrent transrectal ultrasound and biopsy of his prostate on 4.27.2015. Prostatic volume was 53.55 cm. PSA was 9.13. PSA density 0.17. 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 That biopsy was left base lateral, and less than 5% of the core was involved. There was no granulomatous reaction noted. He underwent repeat/surveillance biopsy on 1.8.2015. Prostatic volume was measured at 53.5 mL. All 12 cores returned negative for adenocarcinoma, with 1 core in the right apex revealing chronic granulomatous inflammation.   2.11.2020: PSA 8.7   8.18.2020: Will check PSA today.     Past Medical History:  Diagnosis Date  . Arthritis   . Bladder cancer ([place])   . CAD (coronary artery disease)    a. 06/2014: NSTEMI s/p PTCA of LCx, 60-80% residual in distal LAD, 70% prox small D1.   b. 02/06/15 NSTEMI  s/p successful PCI/DES to mid RCA  . Diabetes mellitus, type 2 ([place])   . Elevated PSA   . HLD (hyperlipidemia)   . HOH (hard of hearing)   . Hypertension   . NSTEMI (non-ST elevated myocardial infarction) [facility])     Past Surgical History:  Procedure Laterality Date  . ABDOMINAL AORTOGRAM W/LOWER EXTREMITY N/A 08/28/2018   Procedure: ABDOMINAL AORTOGRAM W/LOWER EXTREMITY;  Surgeon: Serafina Mitchell, MD;  Location: [place] [facility];  Service: Cardiovascular;  Laterality: N/A;  bilateral  . ABDOMINAL AORTOGRAM W/LOWER EXTREMITY N/A 07/09/2019   Procedure: ABDOMINAL AORTOGRAM W/LOWER EXTREMITY;  Surgeon: Serafina Mitchell, MD;  Location: [place] [facility];  Service: Cardiovascular;  Laterality: N/A;  unilateral Lt  . CARDIAC CATHETERIZATION  02/06/2015   Procedure: CORONARY STENT INTERVENTION;  Surgeon: [name], MD;  Location: [facility] CATH LAB;  Service: Cardiovascular;;  Mid RCA  . CARDIAC CATHETERIZATION N/A 11/11/2016   Procedure: Left Heart Cath and Coronary Angiography;  Surgeon: [name], MD;  Location: [place] [facility] [place] [facility];  Service: Cardiovascular;  Laterality: N/A;  . CARDIAC CATHETERIZATION N/A 11/11/2016   Procedure: Coronary Balloon Angioplasty;  Surgeon: [name], MD;  Location: [place] [facility];  Service: Cardiovascular;  Laterality: N/A;  . COLONOSCOPY N/A 01/14/2019   Procedure: COLONOSCOPY;  Surgeon: Rogene Houston, MD;  [facility];  Service: Endoscopy;  Laterality: N/A;  730  . CORONARY ANGIOPLASTY  11/11/2016   Mid Cx to Dist Cx lesion, 100 %stenosed. This lesion was treated with balloon angioplasty with a 2.0 balloon  . CYSTOSCOPY W/ RETROGRADES N/A 05/05/2015   Procedure: CYSTOSCOPY WITH BILATERAL RETROGRADE  PYELOGRAMS AND BLADDER BIOPSIES;  Surgeon: [name], MD;  Location: [facility];  Service: Urology;  Laterality: N/A;  . CYSTOSCOPY W/ RETROGRADES Bilateral 07/31/2018   Procedure: CYSTOSCOPY WITH BILATERAL RETROGRADE PYELOGRAM;  Surgeon: [name], MD;  Location: [facility];  Service: Urology;  Laterality: Bilateral;  . CYSTOSCOPY WITH BIOPSY N/A 07/31/2018   Procedure: CYSTOSCOPY WITH BIOPSY;  Surgeon: [name], MD;  Location: [facility];  Service: Urology;  Laterality: N/A;  . LEFT HEART CATHETERIZATION WITH CORONARY ANGIOGRAM N/A 06/17/2014   Procedure: LEFT HEART CATHETERIZATION WITH CORONARY ANGIOGRAM;  Surgeon: [name], MD;  Location: [facility] C[facility] LAB;  Service: Cardiovascular;  Laterality: N/A;  . LEFT HEART CATHETERIZATION WITH CORONARY ANGIOGRAM N/A 02/06/2015   Procedure: LEFT HEART CATHETERIZATION WITH CORONARY ANGIOGRAM;  Surgeon: [name], MD;  Location: [facility] CATH LAB;  Service: Cardiovascular;  Laterality: N/A;  . PERIPHERAL VASCULAR ATHERECTOMY  08/28/2018   Procedure: PERIPHERAL VASCULAR ATHERECTOMY;  Surgeon: Serafina Mitchell, MD;  Location: [place] [facility];  Service: Cardiovascular;;  Prox lt.SFA, Distal SFA  . PERIPHERAL VASCULAR INTERVENTION  07/09/2019   Procedure: PERIPHERAL VASCULAR INTERVENTION;  Surgeon: Serafina Mitchell, MD;  Location: [place] [facility];  Service: Cardiovascular;;  Lt. SFA  . POLYPECTOMY  01/14/2019   Procedure: POLYPECTOMY;  Surgeon: Rogene Houston, MD;  [facility];  Service: Endoscopy;;  colon  . PROSTATE BIOPSY N/A 03/31/2014   Procedure: BIOPSY TRANSRECTAL ULTRASONIC PROSTATE (TUBP);  Surgeon: [name], MD;  Location: [facility];  Service: Urology;  Laterality: N/A;  . TRANSURETHRAL RESECTION OF BLADDER TUMOR N/A 03/14/2013   Procedure: TRANSURETHRAL RESECTION OF BLADDER TUMOR (TURBT);  Surgeon: [name], MD;  Location: [facility];  Service: Urology;  Laterality: N/A;  1  HR WITH MITOMYCIN INSTILLATION   . TRANSURETHRAL RESECTION OF BLADDER TUMOR N/A 09/30/2013   Procedure: TRANSURETHRAL RESECTION OF BLADDER TUMOR (TURBT);  Surgeon: [name], MD;  Location: [facility];  Service: Urology;  Laterality: N/A;  . TRANSURETHRAL RESECTION OF BLADDER TUMOR N/A 03/31/2014   Procedure: TRANSURETHRAL RESECTION OF BLADDER TUMOR (TURBT);  Surgeon: [name], MD;  Location: [facility];  Service: Urology;  Laterality: N/A;  . TRANSURETHRAL RESECTION OF BLADDER TUMOR N/A 05/31/2015   Procedure: Cystoscopy with clott evacuation and fulgeration of bleeders, retrograde pylegram;  Surgeon: [name], MD;  Location: [facility];  Service: Urology;  Laterality: N/A;    Home Medications:  Allergies as of 02/25/2020      Reactions   Lipitor [atorvastatin] Other (See Comments)   Myalgia   Other Other (See Comments)   All Cholesterol Meds - causes muscle and joint pain   Pravastatin Other (See Comments)   Myalgia      Medication List       Accurate as of February 25, 2020 10:50 AM. If you have any questions, ask your nurse or doctor.        acetaminophen 500 MG tablet Commonly known as: TYLENOL Take 500 mg by mouth every 6 (six) hours as needed (pain).   aspirin 81 MG chewable tablet Chew 1 tablet (81 mg total) by mouth daily. What changed: when to take this   Cholecalciferol 25 MCG (1000 UT) tablet Take 1,000 Units by mouth 2 (two) times a week.   clopidogrel 75 MG tablet Commonly known as: Plavix Take 1 tablet (75 mg total) by mouth daily.   FISH OIL PO Take 1 capsule by mouth daily.   glimepiride 2 MG tablet Commonly known as: AMARYL   hydrALAZINE 50 MG tablet Commonly known as: APRESOLINE Take 1 tablet (50 mg total) by mouth 3 (three) times daily.   metoprolol succinate 25 MG 24 hr tablet Commonly known as: TOPROL-XL TAKE 3 TABLES TOTAL DAILY   sitaGLIPtin 100 MG tablet Commonly known as: JANUVIA Take  100 mg by mouth every morning. (1100)   traMADol-acetaminophen 37.5-325 MG tablet Commonly known as: ULTRACET Take 1-2 tablets by mouth every 6 (six) hours as needed.       Allergies:  Allergies  Allergen Reactions  . Lipitor [Atorvastatin] Other (See Comments)    Myalgia  . Other Other (See Comments)    All Cholesterol Meds - causes muscle and joint pain  . Pravastatin Other (See Comments)    Myalgia    Family History  Problem Relation Age of Onset  . Cancer Child   . Asthma Mother     Social History:  reports that he quit smoking about 26 years ago. His smoking use included cigarettes. He has a 30.00 pack-year smoking history. He has never used smokeless tobacco. He reports  that he does not drink alcohol or use drugs.  ROS: A complete review of systems was performed.  All systems are negative except for pertinent findings as noted.  Physical Exam:  Vital signs in last 24 hours: BP (!) 146/81   Pulse 74   Temp (!) 97.3 F (36.3 C)   Ht 5\' 8"  (1.727 m)   Wt 224 lb (101.6 kg)   BMI 34.06 kg/m  Constitutional:  Alert and oriented, No acute distress Cardiovascular: Regular rate  Respiratory: Normal respiratory effort GI: Abdomen is soft, nontender, nondistended, no abdominal masses. No CVAT.  Genitourinary: Normal male phallus, testes are descended bilaterally and non-tender and without masses, scrotum is normal in appearance without lesions or masses, perineum is normal on inspection. Lymphatic: No lymphadenopathy Neurologic: Grossly intact, no focal deficits Psychiatric: Normal mood and affect  Laboratory Data:  No results for input(s): WBC, HGB, HCT, PLT in the last 72 hours.  No results for input(s): NA, K, CL, GLUCOSE, BUN, CALCIUM, CREATININE in the last 72 hours.  Invalid input(s): CO3   Results for orders placed or performed in visit on 02/25/20 (from the past 24 hour(s))  POCT urinalysis dipstick     Status: None   Collection Time: 02/25/20 10:15 AM   Result Value Ref Range   Color, UA yellow    Clarity, UA     Glucose, UA Negative Negative   Bilirubin, UA neg    Ketones, UA neg    Spec Grav, UA 1.020 1.010 - 1.025   Blood, UA neg    pH, UA 5.0 5.0 - 8.0   Protein, UA Negative Negative   Urobilinogen, UA 0.2 0.2 or 1.0 E.U./dL   Nitrite, UA neg    Leukocytes, UA Negative Negative   Appearance clear    Odor     Cystoscopy Procedure Note:  Indication: Follow-up of bladder cancer  After informed consent and discussion of the procedure and its risks, Timothy [name] was positioned and prepped in the standard fashion. Cystoscopy was performed with a flexible cystoscope. The urethra, bladder neck and entire bladder was visualized in a standard fashion. The prostate was obstructive with bilobar hypertrophy. The ureteral orifices were visualized in their normal location and orientation.  Urothelium was normal except for persistent erythematous lesion on the left bladder wall.  Consistent with prior biopsy site/BCG.  No raised urothelium.  Bladder washings were taken and sent for cytology.      Impression/Assessment:  He is due a repeat PSA today. I do think it would be appropriate to follow-up with a prostate MRI before next OV. Unless there are areas of suspicion on this scan, I do not think we need to repeat a bx at this point.     Plan:  1. PSA today, additionally basic metabolic panel will be drawn-- will forward results  2. Will call to schedule prostate MRI  3. Return for OV in 6 mo w/ cysto and labs

## 2020-02-26 LAB — BASIC METABOLIC PANEL
BUN/Creatinine Ratio: 11 (calc) (ref 6–22)
BUN: 16 mg/dL (ref 7–25)
CO2: 30 mmol/L (ref 20–32)
Calcium: 9.5 mg/dL (ref 8.6–10.3)
Chloride: 103 mmol/L (ref 98–110)
Creat: 1.43 mg/dL — ABNORMAL HIGH (ref 0.70–1.18)
Glucose, Bld: 183 mg/dL — ABNORMAL HIGH (ref 65–139)
Potassium: 4.8 mmol/L (ref 3.5–5.3)
Sodium: 140 mmol/L (ref 135–146)

## 2020-02-26 LAB — PSA: PSA: 6.2 ng/mL — ABNORMAL HIGH (ref <=4.0)

## 2020-02-27 LAB — CYTOLOGY - NON PAP

## 2020-03-02 DIAGNOSIS — R69 Illness, unspecified: Secondary | ICD-10-CM | POA: Diagnosis not present

## 2020-03-11 DIAGNOSIS — R69 Illness, unspecified: Secondary | ICD-10-CM | POA: Diagnosis not present

## 2020-03-24 ENCOUNTER — Ambulatory Visit (INDEPENDENT_AMBULATORY_CARE_PROVIDER_SITE_OTHER): Payer: Medicare HMO | Admitting: Cardiology

## 2020-03-24 ENCOUNTER — Encounter: Payer: Self-pay | Admitting: Cardiology

## 2020-03-24 VITALS — BP 128/66 | HR 58 | Temp 97.3°F | Ht 68.0 in | Wt 223.0 lb

## 2020-03-24 DIAGNOSIS — E782 Mixed hyperlipidemia: Secondary | ICD-10-CM

## 2020-03-24 DIAGNOSIS — I10 Essential (primary) hypertension: Secondary | ICD-10-CM

## 2020-03-24 DIAGNOSIS — G720 Drug-induced myopathy: Secondary | ICD-10-CM

## 2020-03-24 DIAGNOSIS — T466X5A Adverse effect of antihyperlipidemic and antiarteriosclerotic drugs, initial encounter: Secondary | ICD-10-CM

## 2020-03-24 NOTE — Patient Instructions (Signed)
Medication Instructions:  Your physician recommends that you continue on your current medications as directed. Please refer to the Current Medication list given to you today.   Labwork: none  Testing/Procedures: none  Follow-Up: Your physician recommends that you schedule a follow-up appointment in: nurse visit to go over medications  Your physician wants you to follow-up in: 6 months with DR. BRANCH.  You will receive a reminder letter in the mail two months in advance. If you don't receive a letter, please call our office to schedule the follow-up appointment.    Any Other Special Instructions Will Be Listed Below (If Applicable).   PLEASE BRING ALL MEDICATIONS TO NURSE VISIT   You have been referred to [facility], THEY WILL CONTACT YOU      If you need a refill on your cardiac medications before your next appointment, please call your pharmacy.

## 2020-03-24 NOTE — Progress Notes (Signed)
Clinical Summary Mr. Batten is a 74 y.o.male seen today for follow up of the medical problems. This is a focused visit for management of his HTN and hyperlipidemia. For more detailed history please refer to prior clinic note.     1. HTN - norvascthat we started last visitcaused foot pain, stopped and resolved. - compliant withmeds  - last visit we increased hydralazine to 60m tid due to high bp's - he is unsure his home regimen and what he is taking at home.   2. Hyperlipidemia - intolerant to statins, has been on zetia only. He reports muscle aches on zetia, now off.  -labs followed by pcp  - 01/2020 TC 206 TG 364 HDL 28 LDL 114        Past Medical History:  Diagnosis Date  . Arthritis   . Bladder cancer ([place]   . CAD (coronary artery disease)    a. 06/2014: NSTEMI s/p PTCA of LCx, 60-80% residual in distal LAD, 70% prox small D1.   b. 02/06/15 NSTEMI  s/p successful PCI/DES to mid RCA  . Diabetes mellitus, type 2 ([place]   . Elevated PSA   . HLD (hyperlipidemia)   . HOH (hard of hearing)   . Hypertension   . NSTEMI (non-ST elevated myocardial infarction) (HCC)      Allergies  Allergen Reactions  . Lipitor [Atorvastatin] Other (See Comments)    Myalgia  . Other Other (See Comments)    All Cholesterol Meds - causes muscle and joint pain  . Pravastatin Other (See Comments)    Myalgia     Current Outpatient Medications  Medication Sig Dispense Refill  . acetaminophen (TYLENOL) 500 MG tablet Take 500 mg by mouth every 6 (six) hours as needed (pain).    .Marland Kitchenaspirin 81 MG chewable tablet Chew 1 tablet (81 mg total) by mouth daily. (Patient taking differently: Chew 81 mg by mouth daily after breakfast. )    . Cholecalciferol 25 MCG (1000 UT) tablet Take 1,000 Units by mouth 2 (two) times a week.     . clopidogrel (PLAVIX) 75 MG tablet Take 1 tablet (75 mg total) by mouth daily. 30 tablet 11  . glimepiride (AMARYL) 2 MG tablet     . hydrALAZINE  (APRESOLINE) 50 MG tablet Take 1 tablet (50 mg total) by mouth 3 (three) times daily. 270 tablet 3  . metoprolol succinate (TOPROL-XL) 25 MG 24 hr tablet TAKE 3 TABLES TOTAL DAILY 270 tablet 3  . Omega-3 Fatty Acids (FISH OIL PO) Take 1 capsule by mouth daily.    . sitaGLIPtin (JANUVIA) 100 MG tablet Take 100 mg by mouth every morning. (1100)    . traMADol-acetaminophen (ULTRACET) 37.5-325 MG tablet Take 1-2 tablets by mouth every 6 (six) hours as needed.     No current facility-administered medications for this visit.     Past Surgical History:  Procedure Laterality Date  . ABDOMINAL AORTOGRAM W/LOWER EXTREMITY N/A 08/28/2018   Procedure: ABDOMINAL AORTOGRAM W/LOWER EXTREMITY;  Surgeon: BSerafina Mitchell MD;  [facility];  Service: Cardiovascular;  Laterality: N/A;  bilateral  . ABDOMINAL AORTOGRAM W/LOWER EXTREMITY N/A 07/09/2019   Procedure: ABDOMINAL AORTOGRAM W/LOWER EXTREMITY;  Surgeon: BSerafina Mitchell MD;  [facility];  Service: Cardiovascular;  Laterality: N/A;  unilateral Lt  . CARDIAC CATHETERIZATION  02/06/2015   Procedure: CORONARY STENT INTERVENTION;  Surgeon: DLeonie Man MD;  [facility];  Service: Cardiovascular;;  Mid RCA  .  CARDIAC CATHETERIZATION N/A 11/11/2016   Procedure: Left Heart Cath and Coronary Angiography;  Surgeon: [name], MD;  Location: [place] [facility];  Service: Cardiovascular;  Laterality: N/A;  . CARDIAC CATHETERIZATION N/A 11/11/2016   Procedure: Coronary Balloon Angioplasty;  Surgeon: [name], MD;  Location: [place] [facility];  Service: Cardiovascular;  Laterality: N/A;  . COLONOSCOPY N/A 01/14/2019   Procedure: COLONOSCOPY;  Surgeon: Rogene Houston, MD;  [facility];  Service: Endoscopy;  Laterality: N/A;  730  . CORONARY ANGIOPLASTY  11/11/2016   Mid Cx to Dist Cx lesion, 100 %stenosed. This lesion was treated with balloon angioplasty with a 2.0 balloon  . CYSTOSCOPY W/  RETROGRADES N/A 05/05/2015   Procedure: CYSTOSCOPY WITH BILATERAL RETROGRADE PYELOGRAMS AND BLADDER BIOPSIES;  Surgeon: [name], MD;  [facility]: [facility];  Service: Urology;  Laterality: N/A;  . CYSTOSCOPY W/ RETROGRADES Bilateral 07/31/2018   Procedure: CYSTOSCOPY WITH BILATERAL RETROGRADE PYELOGRAM;  Surgeon: [name], MD;  [facility]: [facility];  Service: Urology;  Laterality: Bilateral;  . CYSTOSCOPY WITH BIOPSY N/A 07/31/2018   Procedure: CYSTOSCOPY WITH BIOPSY;  Surgeon: [name], MD;  [facility]: [facility];  Service: Urology;  Laterality: N/A;  . LEFT HEART CATHETERIZATION WITH CORONARY ANGIOGRAM N/A 06/17/2014   Procedure: LEFT HEART CATHETERIZATION WITH CORONARY ANGIOGRAM;  Surgeon: [name], MD;  Location: [facility] C[facility] LAB;  Service: Cardiovascular;  Laterality: N/A;  . LEFT HEART CATHETERIZATION WITH CORONARY ANGIOGRAM N/A 02/06/2015   Procedure: LEFT HEART CATHETERIZATION WITH CORONARY ANGIOGRAM;  Surgeon: [name], MD;  Location: [facility] CATH LAB;  Service: Cardiovascular;  Laterality: N/A;  . PERIPHERAL VASCULAR ATHERECTOMY  08/28/2018   Procedure: PERIPHERAL VASCULAR ATHERECTOMY;  Surgeon: Serafina Mitchell, MD;  Location: [place] [facility];  Service: Cardiovascular;;  Prox lt.SFA, Distal SFA  . PERIPHERAL VASCULAR INTERVENTION  07/09/2019   Procedure: PERIPHERAL VASCULAR INTERVENTION;  Surgeon: Serafina Mitchell, MD;  Location: [place] [facility];  Service: Cardiovascular;;  Lt. SFA  . POLYPECTOMY  01/14/2019   Procedure: POLYPECTOMY;  Surgeon: Rogene Houston, MD;  [facility];  Service: Endoscopy;;  colon  . PROSTATE BIOPSY N/A 03/31/2014   Procedure: BIOPSY TRANSRECTAL ULTRASONIC PROSTATE (TUBP);  Surgeon: [name], MD;  Location: [facility];  Service: Urology;  Laterality: N/A;  . TRANSURETHRAL RESECTION OF BLADDER TUMOR N/A 03/14/2013   Procedure: TRANSURETHRAL RESECTION OF BLADDER TUMOR (TURBT);  Surgeon: [name], MD;   Location: [facility];  Service: Urology;  Laterality: N/A;  1 HR WITH MITOMYCIN INSTILLATION   . TRANSURETHRAL RESECTION OF BLADDER TUMOR N/A 09/30/2013   Procedure: TRANSURETHRAL RESECTION OF BLADDER TUMOR (TURBT);  Surgeon: [name], MD;  Location: [facility];  Service: Urology;  Laterality: N/A;  . TRANSURETHRAL RESECTION OF BLADDER TUMOR N/A 03/31/2014   Procedure: TRANSURETHRAL RESECTION OF BLADDER TUMOR (TURBT);  Surgeon: [name], MD;  Location: [facility];  Service: Urology;  Laterality: N/A;  . TRANSURETHRAL RESECTION OF BLADDER TUMOR N/A 05/31/2015   Procedure: Cystoscopy with clott evacuation and fulgeration of bleeders, retrograde pylegram;  Surgeon: [name], MD;  Location: [facility];  Service: Urology;  Laterality: N/A;     Allergies  Allergen Reactions  . Lipitor [Atorvastatin] Other (See Comments)    Myalgia  . Other Other (See Comments)    All Cholesterol Meds - causes muscle and joint pain  . Pravastatin Other (See Comments)    Myalgia  Family History  Problem Relation Age of Onset  . Cancer Child   . Asthma Mother      Social History Mr. Cortese reports that he quit smoking about 27 years ago. His smoking use included cigarettes. He has a 30.00 pack-year smoking history. He has never used smokeless tobacco. Mr. Rieth reports no history of alcohol use.   Review of Systems CONSTITUTIONAL: No weight loss, fever, chills, weakness or fatigue.  HEENT: Eyes: No visual loss, blurred vision, double vision or yellow sclerae.No hearing loss, sneezing, congestion, runny nose or sore throat.  SKIN: No rash or itching.  CARDIOVASCULAR: per hpi RESPIRATORY: No shortness of breath, cough or sputum.  GASTROINTESTINAL: No anorexia, nausea, vomiting or diarrhea. No abdominal pain or blood.  GENITOURINARY: No burning on urination, no polyuria NEUROLOGICAL: No headache, dizziness, syncope,  paralysis, ataxia, numbness or tingling in the extremities. No change in bowel or bladder control.  MUSCULOSKELETAL: No muscle, back pain, joint pain or stiffness.  LYMPHATICS: No enlarged nodes. No history of splenectomy.  PSYCHIATRIC: No history of depression or anxiety.  ENDOCRINOLOGIC: No reports of sweating, cold or heat intolerance. No polyuria or polydipsia.  Marland Kitchen   Physical Examination Today's Vitals   03/24/20 1004  BP: 128/66  Pulse: (!) 58  Temp: (!) 97.3 F (36.3 C)  SpO2: 96%  Weight: 223 lb (101.2 kg)  Height: '5\' 8"'  (1.727 m)   Body mass index is 33.91 kg/m.  Gen: resting comfortably, no acute distress HEENT: no scleral icterus, pupils equal round and reactive, no palptable cervical adenopathy,  CV: RRR, no m/r/g, no jvd Resp: Clear to auscultation bilaterally GI: abdomen is soft, non-tender, non-distended, normal bowel sounds, no hepatosplenomegaly MSK: extremities are warm, no edema.  Skin: warm, no rash Neuro:  no focal deficits Psych: appropriate affect   Diagnostic Studies  06/2014 Cath FINDINGS: Hemodynamics:  Central Aortic Pressure / Mean: 108/62/83 mmHg  Left Ventricular Pressure / LVEDP: 107/7/17 mmHg  Left Ventriculography:  EF:~45% %  Wall Motion:global Hypokinesis  Coronary Anatomy:  Dominance: Right  Left Main: Large caliber, long trunk that trifurcates into the LAD, Circumflex & Ramus Intermedius. Angiographically normal. XBD:ZHGDJM as a large caliber vessel that tapers into a relatively small caliber (~1.5-2.0 mm) vessel distally. It wraps the apex, perfusing the distal 1/3 of the infero-apex. The distal vessel has tandem 60&70-80% focal lesions prior to the apex.  D1:Very small caliber bifurcating vessel with proximal ~70% stenosis.  Left Circumflex:Begins as a large caliber vessel that also tapers to a small to moderate caliber vessel in the AV Groove where it gives off a small caliber OM1 Taeden Geller and is 100% occluded  just beyond that point.   Post-PTCA: beyond the occluded AV Groove Circumflex, there is a small to moderate caliber LPL Shadae Reino.  Ramus intermedius:Large caliber vessel that bifurcates in the mid-vessel into to small to moderate caliber branches that both reach the apex.   RCA: Large caliber, dominant vessel with diffuse ~20-40% lesions, but no obstructive disease. It bifurcates distally in to a small caliber, short PDA and Right Posterior AV Groove Amberle Lyter (RPAV) branches. RPAV gives off several very small banches.  After reviewing the initial angiography, the culprit lesion was thought to be the occluded distal-AV Groove circumflesx. Preparation were made to proceed with PTCA on this lesion.  Percutaneous Coronary Intervention:  Guide: 6 Fr CLS 3.5 -- very difficult guide support, catheter moved &disengaged with patient movement Guidewire: BMW (advanced initially in to an atrial Colbi Schiltz for support & balloon  advanced into proximal Circumflex allowing for further advancmemt of the wire beyond the culprit lesion. Predilation Balloon #1: Emerge 1.5 mm x 12 mm; Used to assist with wire support to advance guidewire ? 6 Atm x 30 Sec, 8 Atm x 30 Sec ? Post inflation revealed a small to moderate follow-on circumflex with LPL Benelli Winther.  ? Due to the relatively small sized distal vessel, the decision was to perform PTCA only. Balloon #2: Euphora 2.0 mm x 12 mm;  ? 8 Atm x 90 Sec x 2 with IC NTG - no recoil ? Final Diameter: ~2.0 mm  Post deployment angiography in multiple views, with and without guidewire in place revealed excellent stent deployment and lesion coverage. There was no evidence of dissection or perforation.  POST-OPERATIVE DIAGNOSIS:   Severe 2 vessel disease in small caliber vessel - distal AV Groove Circumflex 100% and distal LAD ~60&80% lesions (1.5 mm vessel)  Successful PTCA of the occluded AV Groove Circumflex with Stent-like result.  Moderately reduced  LVEF by LV Gram  Normal LVEDP   06/2014 Echo Study Conclusions  - Left ventricle: The cavity size was normal. Wall thickness was normal. Systolic function was normal. The estimated ejection fraction was in the range of 50% to 55%. Inferior hypokinesis. Doppler parameters are consistent with abnormal left ventricular relaxation (grade 1 diastolic dysfunction). The E/e&' ratio is 15, suggesting borderline elevated LV filling pressure. - Aortic valve: Trileaflet. Sclerosis without stenosis. There was no regurgitation. - Left atrium: The atrium was normal in size. - Tricuspid valve: There was mild regurgitation. - Pulmonary arteries: PA peak pressure: 33 mm Hg (S).  Impressions:  - LVEF 50-55%, inferior hypokinesis, mild TR, top normal RVSP, diastolic dysfunction with borderline elevated LV filling pressure.   11/2016 echo Study Conclusions  - Left ventricle: The cavity size was normal. Wall thickness was normal. Systolic function was mildly to moderately reduced. The estimated ejection fraction was in the range of 40% to 45%. There is akinesis of the basal-midinferior and inferoseptal myocardium. Doppler parameters are consistent with abnormal left ventricular relaxation (grade 1 diastolic dysfunction). - Aortic valve: Trileaflet; mildly thickened, mildly calcified leaflets. There was trivial regurgitation.  Impressions:  - EF is mildly reduced when compared to prior.   11/2016 cath  There is mild left ventricular systolic dysfunction.  The left ventricular ejection fraction is 45-50% by visual estimate.  Mid RCA-2 lesion, 25 %stenosed.  Patent mid RCA stent.  Ost 2nd Diag to 2nd Diag lesion, 80 %stenosed. Unchanged from prior.  Dist LAD lesion, 60 %stenosed. Unchanged from prior.  Mid Cx to Dist Cx lesion, 100 %stenosed. This lesion was treated with balloon angioplasty with a 2.0 balloon. Several inflations were  performed.  Post intervention, there is a 0% residual stenosis. The vessel was too small to be stented. There is also significant tortuosity in the proximal vessel.  LV end diastolic pressure is mildly elevated.  Initially, Brilinta was given for antiplatelet therapy in the Cath Lab. Since he did not receive a stent, we will transition him to clopidogrel. He needs aggressive secondary prevention including lipid-lowering therapy. He has been intolerant to statins in the past. Would need to consider PCS K9 inhibitor along with other secondary prevention. I anticipate he'll be here 2 days in the hospital. Echocardiogram would be reasonable to check ejection fraction and mitral apparatus.  LAD and diagonal disease was not addressed since they appeared unchanged angiographically. If he has refractory angina, could address the LAD lesion at a later  time.  Jan 2018 AAA [place] No AAA   Jan 2019 nuclear stress  No diagnostic ST segment changes. Occasional PVCs noted throughout the study. Hypertensive response. Technically low risk Duke treadmill score 4.5.  Blood pressure demonstrated a hypertensive response to exercise.  Small, moderate intensity, partially reversible mid to apical anterior defect consistent with ischemia.  This is a high risk study predominantly based on calculated LVEF. Consider echocardiogram for further confirmation.  Nuclear stress EF: 25%.   02/2018 echo Study Conclusions  - Left ventricle: Mid and basal inferior wall hypokinesis. The cavity size was mildly dilated. Wall thickness was normal. Systolic function was mildly to moderately reduced. The estimated ejection fraction was in the range of 40% to 45%. - Aortic valve: There was trivial regurgitation. Valve area (VTI): 1.51 cm^2. Valve area (Vmax): 1.75 cm^2. Valve area (Vmean): 1.64 cm^2. - Atrial septum: No defect or patent foramen ovale was identified. - Pulmonary arteries: PA peak pressure: 37  mm Hg (S).     Assessment and Plan  1. HTN - he is not sure of his home medications and what he is taking - he will come back for a nursing visit with all his pill bottles - dynamap bp at goal however manual 158/90 which is what he has been getting at home, I think dynamap was inaccurate - med changes once he comes back for nursing visit, likely increase hydral furter  2. Hyperlipidemia - history of CAD and PAD - intolerant to statins and zetia. Last LDL 114 - refer to lipid clinic to consider pcsk9 inhibitor    Nursing visit soon to bring all his home pills to verify his regimen F/u with me 6 months in clinic      Arnoldo Lenis, M.D.

## 2020-03-25 ENCOUNTER — Other Ambulatory Visit: Payer: Self-pay

## 2020-03-25 ENCOUNTER — Telehealth: Payer: Self-pay

## 2020-03-25 ENCOUNTER — Ambulatory Visit: Payer: Medicare HMO

## 2020-03-25 NOTE — Telephone Encounter (Signed)
Pt came in for nurse visit. He is taking: Hydralazine 50 mg- TID, Metoprolol 25 mg- TID, ASA 81, JANUVIA 100 MG DAILY, Glomepiride 2 mg BID.  This is all he states he takes.

## 2020-03-26 DIAGNOSIS — R69 Illness, unspecified: Secondary | ICD-10-CM | POA: Diagnosis not present

## 2020-03-26 NOTE — Telephone Encounter (Signed)
Ok thx, can he increase hydralazien to 75mg  tid. Needs referal to lipid clinic for hyperlipidemia   Zandra Abts MD

## 2020-03-27 MED ORDER — HYDRALAZINE HCL 50 MG PO TABS: 75.0000 mg | ORAL_TABLET | Freq: Three times a day (TID) | ORAL | 3 refills | Status: DC

## 2020-03-27 NOTE — Telephone Encounter (Signed)
Called pt. No answer. Left message for pt to return call. Sent in new RX for increased dose.

## 2020-03-27 NOTE — Addendum Note (Signed)
Addended byDebbora Lacrosse R on: 03/27/2020 11:24 AM   Modules accepted: Orders

## 2020-04-03 ENCOUNTER — Encounter: Payer: Self-pay | Admitting: General Practice

## 2020-04-03 DIAGNOSIS — I2510 Atherosclerotic heart disease of native coronary artery without angina pectoris: Secondary | ICD-10-CM | POA: Diagnosis not present

## 2020-04-03 DIAGNOSIS — I214 Non-ST elevation (NSTEMI) myocardial infarction: Secondary | ICD-10-CM | POA: Diagnosis not present

## 2020-04-03 DIAGNOSIS — Z720 Tobacco use: Secondary | ICD-10-CM | POA: Diagnosis not present

## 2020-04-03 DIAGNOSIS — E1151 Type 2 diabetes mellitus with diabetic peripheral angiopathy without gangrene: Secondary | ICD-10-CM | POA: Diagnosis not present

## 2020-04-08 ENCOUNTER — Encounter: Payer: Self-pay | Admitting: General Practice

## 2020-04-10 DIAGNOSIS — E119 Type 2 diabetes mellitus without complications: Secondary | ICD-10-CM | POA: Diagnosis not present

## 2020-04-10 DIAGNOSIS — I10 Essential (primary) hypertension: Secondary | ICD-10-CM | POA: Diagnosis not present

## 2020-04-10 DIAGNOSIS — E6609 Other obesity due to excess calories: Secondary | ICD-10-CM | POA: Diagnosis not present

## 2020-04-10 DIAGNOSIS — T466X5A Adverse effect of antihyperlipidemic and antiarteriosclerotic drugs, initial encounter: Secondary | ICD-10-CM | POA: Diagnosis not present

## 2020-04-10 DIAGNOSIS — R0989 Other specified symptoms and signs involving the circulatory and respiratory systems: Secondary | ICD-10-CM | POA: Diagnosis not present

## 2020-04-10 DIAGNOSIS — E7849 Other hyperlipidemia: Secondary | ICD-10-CM | POA: Diagnosis not present

## 2020-04-10 DIAGNOSIS — Z6834 Body mass index (BMI) 34.0-34.9, adult: Secondary | ICD-10-CM | POA: Diagnosis not present

## 2020-04-10 DIAGNOSIS — I214 Non-ST elevation (NSTEMI) myocardial infarction: Secondary | ICD-10-CM | POA: Diagnosis not present

## 2020-04-10 DIAGNOSIS — M79673 Pain in unspecified foot: Secondary | ICD-10-CM | POA: Diagnosis not present

## 2020-04-15 DIAGNOSIS — M79671 Pain in right foot: Secondary | ICD-10-CM | POA: Diagnosis not present

## 2020-04-15 DIAGNOSIS — M79672 Pain in left foot: Secondary | ICD-10-CM | POA: Diagnosis not present

## 2020-04-15 DIAGNOSIS — M25579 Pain in unspecified ankle and joints of unspecified foot: Secondary | ICD-10-CM | POA: Diagnosis not present

## 2020-04-23 DIAGNOSIS — R69 Illness, unspecified: Secondary | ICD-10-CM | POA: Diagnosis not present

## 2020-04-27 ENCOUNTER — Ambulatory Visit (HOSPITAL_COMMUNITY)
Admission: RE | Admit: 2020-04-27 | Discharge: 2020-04-27 | Disposition: A | Discharge status: Home / Self Care | Payer: Medicare HMO | Source: Ambulatory Visit | Attending: Surgery | Admitting: Surgery

## 2020-04-27 ENCOUNTER — Ambulatory Visit (INDEPENDENT_AMBULATORY_CARE_PROVIDER_SITE_OTHER): Payer: Medicare HMO | Admitting: Physician Assistant

## 2020-04-27 ENCOUNTER — Ambulatory Visit (INDEPENDENT_AMBULATORY_CARE_PROVIDER_SITE_OTHER)
Admission: RE | Admit: 2020-04-27 | Discharge: 2020-04-27 | Disposition: A | Discharge status: Home / Self Care | Payer: Medicare HMO | Source: Ambulatory Visit | Attending: Surgery | Admitting: Surgery

## 2020-04-27 ENCOUNTER — Other Ambulatory Visit: Payer: Self-pay

## 2020-04-27 VITALS — BP 162/88 | HR 60 | Temp 97.7°F | Resp 16 | Ht 68.0 in | Wt 220.0 lb

## 2020-04-27 DIAGNOSIS — I739 Peripheral vascular disease, unspecified: Secondary | ICD-10-CM

## 2020-04-27 DIAGNOSIS — I70212 Atherosclerosis of native arteries of extremities with intermittent claudication, left leg: Secondary | ICD-10-CM | POA: Diagnosis not present

## 2020-04-27 DIAGNOSIS — M549 Dorsalgia, unspecified: Secondary | ICD-10-CM | POA: Diagnosis not present

## 2020-04-27 NOTE — Progress Notes (Signed)
Established Intermittent Claudication   History of Present Illness   Timothy [name] is a 74 y.o. ([birth year]) male who presents for surveillance of PAD.  He underwent atherectomy and drug-coated balloon angioplasty of left SFA in 2019 for claudication.  He then developed recurrent stenosis and return of lifestyle limiting claudication and underwent stenting of left SFA in August 2020 by Dr. Trula Slade.  He denies any claudication symptoms, rest pain, or nonhealing wounds of bilateral lower extremities.  He does however report some low back pain with radiation to left thigh and sometimes into the left foot.  This is a new symptom since last office visit.  He is a former smoker.  He is taking his aspirin and Plavix daily.  He is following regularly with his PCP for management of type 2 diabetes mellitus and hypertension.    The patient's PMH, PSH, SH, and FamHx were reviewed and are unchanged from prior visit.  Current Outpatient Medications  Medication Sig Dispense Refill  . acetaminophen (TYLENOL) 500 MG tablet Take 500 mg by mouth every 6 (six) hours as needed (pain).    Marland Kitchen aspirin 81 MG chewable tablet Chew 1 tablet (81 mg total) by mouth daily. (Patient taking differently: Chew 81 mg by mouth daily after breakfast. )    . glimepiride (AMARYL) 2 MG tablet     . hydrALAZINE (APRESOLINE) 50 MG tablet Take 1.5 tablets (75 mg total) by mouth 3 (three) times daily. 135 tablet 3  . metoprolol succinate (TOPROL-XL) 25 MG 24 hr tablet TAKE 3 TABLES TOTAL DAILY 270 tablet 3  . sitaGLIPtin (JANUVIA) 100 MG tablet Take 100 mg by mouth every morning. (1100)     No current facility-administered medications for this visit.    REVIEW OF SYSTEMS (negative unless checked):   Cardiac:  []  Chest pain or chest pressure? []  Shortness of breath upon activity? []  Shortness of breath when lying flat? []  Irregular heart rhythm?  Vascular:  []  Pain in calf, thigh, or hip brought on by walking? []  Pain in  feet at night that wakes you up from your sleep? []  Blood clot in your veins? []  Leg swelling?  Pulmonary:  []  Oxygen at home? []  Productive cough? []  Wheezing?  Neurologic:  []  Sudden weakness in arms or legs? []  Sudden numbness in arms or legs? []  Sudden onset of difficult speaking or slurred speech? []  Temporary loss of vision in one eye? []  Problems with dizziness?  Gastrointestinal:  []  Blood in stool? []  Vomited blood?  Genitourinary:  []  Burning when urinating? []  Blood in urine?  Psychiatric:  []  Major depression  Hematologic:  []  Bleeding problems? []  Problems with blood clotting?  Dermatologic:  []  Rashes or ulcers?  Constitutional:  []  Fever or chills?  Ear/Nose/Throat:  []  Change in hearing? []  Nose bleeds? []  Sore throat?  Musculoskeletal:  [x]  Back pain? []  Joint pain? []  Muscle pain?   Physical Examination   Vitals:   04/27/20 0846  BP: (!) 162/88  Pulse: 60  Resp: 16  Temp: 97.7 F (36.5 C)  TempSrc: Temporal  SpO2: 98%  Weight: 220 lb (99.8 kg)  Height: 5\' 8"  (1.727 m)   Body mass index is 33.45 kg/m.  General:  WDWN in NAD; vital signs documented above Gait: Not observed HENT: WNL, normocephalic Pulmonary: normal non-labored breathing , without Rales, rhonchi,  wheezing Cardiac: regular HR Abdomen: soft, NT, no masses Skin: without rashes Vascular Exam/Pulses:  Right Left  Radial 2+ (normal) 2+ (normal)  Femoral  2+ (normal) 2+ (normal)  Popliteal trace trace  DP 1+ (weak) 1+ (weak)  PT 1+ (weak) 2+ (normal)   Extremities: without ischemic changes, without Gangrene , without cellulitis; without open wounds;  Musculoskeletal: no muscle wasting or atrophy  Neurologic: A&O X 3;  No focal weakness or paresthesias are detected Psychiatric:  The pt has Normal affect.  Non-Invasive Vascular imaging    ABI ABI/TBIToday's ABIToday's TBIPrevious ABIPrevious TBI    +-------+-----------+-----------+------------+------------+  Right 1.04    0.73    1.09    0.83      +-------+-----------+-----------+------------+------------+  Left  0.94    0.70    1.08    0.79   LLE Arterial Duplex  Patent left SFA stent without hemodynamically significant stenosis   Medical Decision Making   [name] is a 74 y.o. male who presents for routine PAD surveillance; history of left SFA stent in 07/2019   Left SFA stent is patent based on arterial duplex and does not have any hemodynamically significant stenosis  ABIs unchanged over the past 6 months  Continue aspirin and Plavix if tolerable  At the request of the patient he will be referred to a spine specialist due to lumbar pain with radiation down left lower extremity  We will recheck left lower extremity arterial duplex and ABIs yearly for surveillance   Dagoberto Ligas PA-C [facility] Office: [facility] MD: Trula Slade

## 2020-05-01 DIAGNOSIS — M48062 Spinal stenosis, lumbar region with neurogenic claudication: Secondary | ICD-10-CM | POA: Diagnosis not present

## 2020-05-04 DIAGNOSIS — I214 Non-ST elevation (NSTEMI) myocardial infarction: Secondary | ICD-10-CM | POA: Diagnosis not present

## 2020-05-04 DIAGNOSIS — Z720 Tobacco use: Secondary | ICD-10-CM | POA: Diagnosis not present

## 2020-05-04 DIAGNOSIS — E1151 Type 2 diabetes mellitus with diabetic peripheral angiopathy without gangrene: Secondary | ICD-10-CM | POA: Diagnosis not present

## 2020-05-04 DIAGNOSIS — I2510 Atherosclerotic heart disease of native coronary artery without angina pectoris: Secondary | ICD-10-CM | POA: Diagnosis not present

## 2020-05-11 ENCOUNTER — Other Ambulatory Visit: Payer: Self-pay | Admitting: Orthopedic Surgery

## 2020-05-14 ENCOUNTER — Other Ambulatory Visit (HOSPITAL_COMMUNITY): Payer: Self-pay | Admitting: Orthopedic Surgery

## 2020-05-14 ENCOUNTER — Other Ambulatory Visit: Payer: Self-pay | Admitting: Orthopedic Surgery

## 2020-05-14 DIAGNOSIS — M48062 Spinal stenosis, lumbar region with neurogenic claudication: Secondary | ICD-10-CM

## 2020-05-20 ENCOUNTER — Ambulatory Visit (HOSPITAL_COMMUNITY)
Admission: RE | Admit: 2020-05-20 | Discharge: 2020-05-20 | Disposition: A | Discharge status: Home / Self Care | Payer: Medicare HMO | Source: Ambulatory Visit | Attending: Orthopedic Surgery | Admitting: Orthopedic Surgery

## 2020-05-20 ENCOUNTER — Other Ambulatory Visit: Payer: Self-pay

## 2020-05-20 DIAGNOSIS — M545 Low back pain: Secondary | ICD-10-CM | POA: Diagnosis not present

## 2020-05-20 DIAGNOSIS — M48062 Spinal stenosis, lumbar region with neurogenic claudication: Secondary | ICD-10-CM | POA: Diagnosis not present

## 2020-05-25 DIAGNOSIS — M48062 Spinal stenosis, lumbar region with neurogenic claudication: Secondary | ICD-10-CM | POA: Diagnosis not present

## 2020-06-01 DIAGNOSIS — R69 Illness, unspecified: Secondary | ICD-10-CM | POA: Diagnosis not present

## 2020-06-03 DIAGNOSIS — I2510 Atherosclerotic heart disease of native coronary artery without angina pectoris: Secondary | ICD-10-CM | POA: Diagnosis not present

## 2020-06-03 DIAGNOSIS — Z720 Tobacco use: Secondary | ICD-10-CM | POA: Diagnosis not present

## 2020-06-03 DIAGNOSIS — I214 Non-ST elevation (NSTEMI) myocardial infarction: Secondary | ICD-10-CM | POA: Diagnosis not present

## 2020-06-03 DIAGNOSIS — E1151 Type 2 diabetes mellitus with diabetic peripheral angiopathy without gangrene: Secondary | ICD-10-CM | POA: Diagnosis not present

## 2020-06-17 ENCOUNTER — Encounter (HOSPITAL_COMMUNITY): Payer: Self-pay | Admitting: Physical Therapy

## 2020-06-17 ENCOUNTER — Ambulatory Visit (HOSPITAL_COMMUNITY): Payer: Medicare HMO | Attending: Orthopedic Surgery | Admitting: Physical Therapy

## 2020-06-17 ENCOUNTER — Other Ambulatory Visit: Payer: Self-pay

## 2020-06-17 DIAGNOSIS — R2689 Other abnormalities of gait and mobility: Secondary | ICD-10-CM | POA: Insufficient documentation

## 2020-06-17 DIAGNOSIS — R29898 Other symptoms and signs involving the musculoskeletal system: Secondary | ICD-10-CM

## 2020-06-17 NOTE — Therapy (Addendum)
[place], [place], [address] Phone: [phone]   Fax:  [phone]  Physical Therapy Evaluation  Patient Details  Name: Timothy [name] MRN: [id number] Date of Birth: Jan 08, 1946 Referring Provider (PT): Phylliss Bob, MD   Encounter Date: 06/17/2020   PT End of Session - 06/17/20 1104    Visit Number 1    Number of Visits 12    Date for PT Re-Evaluation 07/29/20    Authorization Type [employer] HMO    Progress Note Due on Visit 10    PT Start Time 1020    PT Stop Time 1058    PT Time Calculation (min) 38 min    Equipment Utilized During Treatment Gait belt    Activity Tolerance Patient tolerated treatment well    Behavior During Therapy WFL for tasks assessed/performed           Past Medical History:  Diagnosis Date  . Arthritis   . Bladder cancer ([place])   . CAD (coronary artery disease)    a. 06/2014: NSTEMI s/p PTCA of LCx, 60-80% residual in distal LAD, 70% prox small D1.   b. 02/06/15 NSTEMI  s/p successful PCI/DES to mid RCA  . Diabetes mellitus, type 2 ([place])   . Elevated PSA   . HLD (hyperlipidemia)   . HOH (hard of hearing)   . Hypertension   . NSTEMI (non-ST elevated myocardial infarction) [facility])     Past Surgical History:  Procedure Laterality Date  . ABDOMINAL AORTOGRAM W/LOWER EXTREMITY N/A 08/28/2018   Procedure: ABDOMINAL AORTOGRAM W/LOWER EXTREMITY;  Surgeon: Serafina Mitchell, MD;  Location: [place] [facility];  Service: Cardiovascular;  Laterality: N/A;  bilateral  . ABDOMINAL AORTOGRAM W/LOWER EXTREMITY N/A 07/09/2019   Procedure: ABDOMINAL AORTOGRAM W/LOWER EXTREMITY;  Surgeon: Serafina Mitchell, MD;  Location: [place] [facility];  Service: Cardiovascular;  Laterality: N/A;  unilateral Lt  . CARDIAC CATHETERIZATION  02/06/2015   Procedure: CORONARY STENT INTERVENTION;  Surgeon: [name], MD;  Location: [facility] CATH LAB;  Service: Cardiovascular;;  Mid RCA  . CARDIAC CATHETERIZATION  N/A 11/11/2016   Procedure: Left Heart Cath and Coronary Angiography;  Surgeon: [name], MD;  Location: [place] [facility];  Service: Cardiovascular;  Laterality: N/A;  . CARDIAC CATHETERIZATION N/A 11/11/2016   Procedure: Coronary Balloon Angioplasty;  Surgeon: [name], MD;  Location: [place] [facility];  Service: Cardiovascular;  Laterality: N/A;  . COLONOSCOPY N/A 01/14/2019   Procedure: COLONOSCOPY;  Surgeon: Rogene Houston, MD;  [facility];  Service: Endoscopy;  Laterality: N/A;  730  . CORONARY ANGIOPLASTY  11/11/2016   Mid Cx to Dist Cx lesion, 100 %stenosed. This lesion was treated with balloon angioplasty with a 2.0 balloon  . CYSTOSCOPY W/ RETROGRADES N/A 05/05/2015   Procedure: CYSTOSCOPY WITH BILATERAL RETROGRADE PYELOGRAMS AND BLADDER BIOPSIES;  Surgeon: [name], MD;  Location: [facility];  Service: Urology;  Laterality: N/A;  . CYSTOSCOPY W/ RETROGRADES Bilateral 07/31/2018   Procedure: CYSTOSCOPY WITH BILATERAL RETROGRADE PYELOGRAM;  Surgeon: [name], MD;  Location: [facility];  Service: Urology;  Laterality: Bilateral;  . CYSTOSCOPY WITH BIOPSY N/A 07/31/2018   Procedure: CYSTOSCOPY WITH BIOPSY;  Surgeon: [name], MD;  Location: [facility];  Service: Urology;  Laterality: N/A;  . LEFT HEART CATHETERIZATION WITH CORONARY ANGIOGRAM N/A 06/17/2014   Procedure: LEFT HEART CATHETERIZATION WITH CORONARY ANGIOGRAM;  Surgeon: [name], MD;  Location: [facility] CATH LAB;  Service:  Cardiovascular;  Laterality: N/A;  . LEFT HEART CATHETERIZATION WITH CORONARY ANGIOGRAM N/A 02/06/2015   Procedure: LEFT HEART CATHETERIZATION WITH CORONARY ANGIOGRAM;  Surgeon: [name], MD;  Location: [facility] LAB;  Service: Cardiovascular;  Laterality: N/A;  . PERIPHERAL VASCULAR ATHERECTOMY  08/28/2018   Procedure: PERIPHERAL VASCULAR ATHERECTOMY;  Surgeon: Serafina Mitchell, MD;  Location: [place] [facility];  Service: Cardiovascular;;  Prox lt.SFA, Distal  SFA  . PERIPHERAL VASCULAR INTERVENTION  07/09/2019   Procedure: PERIPHERAL VASCULAR INTERVENTION;  Surgeon: Serafina Mitchell, MD;  Location: [place] [facility];  Service: Cardiovascular;;  Lt. SFA  . POLYPECTOMY  01/14/2019   Procedure: POLYPECTOMY;  Surgeon: Rogene Houston, MD;  [facility];  Service: Endoscopy;;  colon  . PROSTATE BIOPSY N/A 03/31/2014   Procedure: BIOPSY TRANSRECTAL ULTRASONIC PROSTATE (TUBP);  Surgeon: [name], MD;  Location: [facility];  Service: Urology;  Laterality: N/A;  . TRANSURETHRAL RESECTION OF BLADDER TUMOR N/A 03/14/2013   Procedure: TRANSURETHRAL RESECTION OF BLADDER TUMOR (TURBT);  Surgeon: [name], MD;  Location: [facility];  Service: Urology;  Laterality: N/A;  1 HR WITH MITOMYCIN INSTILLATION   . TRANSURETHRAL RESECTION OF BLADDER TUMOR N/A 09/30/2013   Procedure: TRANSURETHRAL RESECTION OF BLADDER TUMOR (TURBT);  Surgeon: [name], MD;  Location: [facility];  Service: Urology;  Laterality: N/A;  . TRANSURETHRAL RESECTION OF BLADDER TUMOR N/A 03/31/2014   Procedure: TRANSURETHRAL RESECTION OF BLADDER TUMOR (TURBT);  Surgeon: [name], MD;  Location: [facility];  Service: Urology;  Laterality: N/A;  . TRANSURETHRAL RESECTION OF BLADDER TUMOR N/A 05/31/2015   Procedure: Cystoscopy with clott evacuation and fulgeration of bleeders, retrograde pylegram;  Surgeon: [name], MD;  Location: [facility];  Service: Urology;  Laterality: N/A;    There were no vitals filed for this visit.    Subjective Assessment - 06/17/20 1022    Subjective Patient reported that the muscles in his lower back have been bothering him. Patient reported that his legs get tired when he walks. He also reported his legs get tired when he rides his bike at home. Patient reported that if he walks about 50 steps his legs get tired. Patient denied any changes in his bowel or bladder  function. Patient denied any tingling or numbness. Patient reported that this issue has been ongoing for a couple years and that it has not progressed or gotten worse.    Pertinent History CAD, HTN, PAD, HOH, history of bladder cancer, NSTEMI    Limitations Walking    How long can you sit comfortably? No issues    How long can you stand comfortably? No issues    How long can you walk comfortably? 50 steps    Diagnostic tests MRI lumbar spine    Patient Stated Goals To be able to walk 5 miles without his legs burning    Currently in Pain? No/denies              [facility] PT Assessment - 06/17/20 0001      Assessment   Medical Diagnosis Lumbar Spinal Stenosis    Referring Provider (PT) Phylliss Bob, MD    Onset Date/Surgical Date --   A couple years ago   Next MD Visit Unknown    Prior Therapy Yes, for his arm      Precautions   Precautions None      Restrictions   Weight Bearing Restrictions No  Balance Screen   Has the patient fallen in the past 6 months No      [place] residence    Type of [place] Access Level entry    Home Layout One level    [place] None      Prior Function   Level of [place] Retired      Associate Professor   Overall Cognitive Status Within Functional Limits for tasks assessed      Observation/Other Assessments   Focus on Therapeutic Outcomes (FOTO)  Perform next session      Sensation   Light Touch Appears Intact      Deep Tendon Reflexes   DTR Assessment Site Patella    Patella DTR 2+      ROM / Strength   AROM / PROM / Strength AROM;Strength      AROM   AROM Assessment Site Lumbar    Lumbar Flexion WFL, Tightness in the back of his legs    Lumbar Extension WFL, no pain    Lumbar - Right Side Bend WFL, tightness    Lumbar - Left Side Bend WFL    Lumbar - Right Rotation WFL    Lumbar - Left Rotation [facility]      Strength   Strength Assessment Site  Hip;Knee;Ankle    Right/Left Hip Right;Left    Right Hip Flexion 4+/5   A little pain   Right Hip Extension 4-/5    Right Hip ABduction 5/5    Left Hip Flexion 4+/5   A little pain   Left Hip Extension 4-/5    Left Hip ABduction 4+/5    Right/Left Knee Right;Left    Right Knee Flexion 4+/5    Right Knee Extension 4+/5    Left Knee Flexion 4+/5    Left Knee Extension 4+/5    Right/Left Ankle Right;Left    Right Ankle Dorsiflexion 5/5    Left Ankle Dorsiflexion 5/5      Palpation   Spinal mobility CPAs of thoracic and lumbar spine hypomobility without pain. CPAs felt good    Palpation comment No tenderness through lumbar paraspinals- felt good.       Ambulation/Gait   Ambulation/Gait Yes    Ambulation Distance (Feet) 376 Feet   2MWT   Assistive device None    Gait Comments Patient reported leg tiredness at the end of the 2 minutes                      Objective measurements completed on examination: See above findings.               PT Education - 06/17/20 1103    Education Details Discussed examination findings, POC, and initial HEP.    Person(s) Educated Patient    Methods Explanation;Handout    Comprehension Verbalized understanding            PT Short Term Goals - 06/17/20 1109      PT SHORT TERM GOAL #1   Title Patient will report understanding and regular compliance of HEP in order to improve strength and improve functional mobility.    Time 3    Period Weeks    Status New    Target Date 07/08/20      PT SHORT TERM GOAL #2   Title Patient will report an improvement in overall subjective complaint of at least 25% for improved QoL.  Time 3    Period Weeks    Status New    Target Date 07/08/20             PT Long Term Goals - 06/17/20 1109      PT LONG TERM GOAL #1   Title Patient will report ability to ambulate for at least 10 minutes with minimal to no leg weakness to improve ease of grocery shopping.    Time 6    Period  Weeks    Status New    Target Date 07/29/20      PT LONG TERM GOAL #2   Title Patient will demonstrate at least 4+/5 strength in all tested lower extremity musculature in order to improve functional mobility and ambulation endurance.    Time 6    Period Weeks    Status New    Target Date 07/29/20      PT LONG TERM GOAL #3   Title Patient will report an improvement in overall subjective complaint of at least 50% for improved QoL.    Time 6    Period Weeks    Status New    Target Date 07/29/20                  Plan - 06/17/20 1344    Clinical Impression Statement Patient is a 74 year old male who presents to outpatient physical therapy with primary complaint of lower extremity weakness with ambulation and back stiffness. Upon examination noted decreased LE strength. Patient with increased leg weakness following 2MWT. With spinal assessment noted hypomobility throughout. Educated patient on examination findings as well as on initial HEP. Patient would benefit from continued skilled physical therapy in order to address the abovementioned deficits and help patient return to PLOF.    Personal Factors and Comorbidities Age;Time since onset of injury/illness/exacerbation;Comorbidity 3+    Comorbidities CAD, HTN, PAD, HOH, history of bladder cancer, NSTEMI    Examination-Activity Limitations Locomotion Level;Transfers;Stairs    Examination-Participation Restrictions Meal Prep;Community Activity    Stability/Clinical Decision Making Evolving/Moderate complexity    Clinical Decision Making Moderate    Rehab Potential Fair    PT Frequency 2x / week    PT Duration 6 weeks    PT Treatment/Interventions ADLs/Self Care Home Management;Aquatic Therapy;Cryotherapy;Electrical Stimulation;Iontophoresis 4mg /ml Dexamethasone;Moist Heat;Traction;DME Instruction;Gait training;Stair training;Functional mobility training;Therapeutic activities;Therapeutic exercise;Balance training;Neuromuscular  re-education;Patient/family education;Orthotic Fit/Training;Manual techniques;Passive range of motion;Dry needling;Energy conservation;Taping    PT Next Visit Plan Perform FOTO, Review HEP, flexion based low back stretching, mobility exercises. Increasing endurance. Talk to [facility] about plan.    PT Home Exercise Plan 06/17/20: Double knee to chest    Consulted and Agree with Plan of Care Patient           Patient will benefit from skilled therapeutic intervention in order to improve the following deficits and impairments:  Abnormal gait, Pain, Improper body mechanics, Decreased mobility, Decreased activity tolerance, Decreased endurance, Decreased strength, Hypomobility, Difficulty walking  Visit Diagnosis: Other abnormalities of gait and mobility - Plan: PT plan of care cert/re-cert  Other symptoms and signs involving the musculoskeletal system - Plan: PT plan of care cert/re-cert     Problem List Patient Active Problem List   Diagnosis Date Noted  . Back pain with radiation 04/27/2020  . PAD (peripheral artery disease) ([place]) 01/01/2020  . Special screening for malignant neoplasms, colon 12/12/2018  . Hypertensive heart disease without heart failure   . Hematuria 05/31/2015  . Gross hematuria 05/28/2015  . HOH (hard of  hearing)   . HLD (hyperlipidemia)   . CAD (coronary artery disease)   . Hypertension   . Diabetes mellitus, type 2 ([place])   . Bladder cancer ([place])   . NSTEMI (non-ST elevated myocardial infarction) ([place]) 06/17/2014   Clarene Critchley PT, DPT 3:20 PM, 06/17/20 [place] [address] [place], [place], [address] Phone: [phone]   Fax:  [phone]  Name: Carston Riedl MRN: [id number] Date of Birth: Jan 02, 1946

## 2020-06-17 NOTE — Patient Instructions (Signed)
Double Knee to Chest (Flexion)    Gently pull both knees toward chest. Feel stretch in lower back or buttock area. Breathing deeply, Hold __30__ seconds. Repeat _3___ times. Do __1-2__ sessions per day.  [email or web address].exer.us/227   Copyright  VHI. All rights reserved.

## 2020-06-22 ENCOUNTER — Ambulatory Visit (HOSPITAL_COMMUNITY): Payer: Medicare HMO | Admitting: Physical Therapy

## 2020-06-22 ENCOUNTER — Encounter (HOSPITAL_COMMUNITY): Payer: Self-pay | Admitting: Physical Therapy

## 2020-06-22 ENCOUNTER — Other Ambulatory Visit: Payer: Self-pay

## 2020-06-22 DIAGNOSIS — R29898 Other symptoms and signs involving the musculoskeletal system: Secondary | ICD-10-CM

## 2020-06-22 DIAGNOSIS — R2689 Other abnormalities of gait and mobility: Secondary | ICD-10-CM | POA: Diagnosis not present

## 2020-06-22 NOTE — Therapy (Signed)
[facility] [address] [place], [place], [address] Phone: [phone]   Fax:  [phone]  Physical Therapy Treatment  Patient Details  Name: Timothy [name] MRN: [id number] Date of Birth: 01-10-1946 Referring Provider (PT): Phylliss Bob, MD   Encounter Date: 06/22/2020   PT End of Session - 06/22/20 1117    Visit Number 2    Number of Visits 12    Date for PT Re-Evaluation 07/29/20    Authorization Type [employer] Medicare HMO    Progress Note Due on Visit 10    PT Start Time 1045    PT Stop Time 1125    PT Time Calculation (min) 40 min    Equipment Utilized During Treatment Gait belt    Activity Tolerance Patient tolerated treatment well    Behavior During Therapy [facility] for tasks assessed/performed           Past Medical History:  Diagnosis Date  . Arthritis   . Bladder cancer ([place])   . CAD (coronary artery disease)    a. 06/2014: NSTEMI s/p PTCA of LCx, 60-80% residual in distal LAD, 70% prox small D1.   b. 02/06/15 NSTEMI  s/p successful PCI/DES to mid RCA  . Diabetes mellitus, type 2 ([place])   . Elevated PSA   . HLD (hyperlipidemia)   . HOH (hard of hearing)   . Hypertension   . NSTEMI (non-ST elevated myocardial infarction) [facility])     Past Surgical History:  Procedure Laterality Date  . ABDOMINAL AORTOGRAM W/LOWER EXTREMITY N/A 08/28/2018   Procedure: ABDOMINAL AORTOGRAM W/LOWER EXTREMITY;  Surgeon: Serafina Mitchell, MD;  Location: [place] CV LAB;  Service: Cardiovascular;  Laterality: N/A;  bilateral  . ABDOMINAL AORTOGRAM W/LOWER EXTREMITY N/A 07/09/2019   Procedure: ABDOMINAL AORTOGRAM W/LOWER EXTREMITY;  Surgeon: Serafina Mitchell, MD;  Location: [place] CV LAB;  Service: Cardiovascular;  Laterality: N/A;  unilateral Lt  . CARDIAC CATHETERIZATION  02/06/2015   Procedure: CORONARY STENT INTERVENTION;  Surgeon: [name], MD;  Location: [facility] CATH LAB;  Service: Cardiovascular;;  Mid RCA  . CARDIAC CATHETERIZATION N/A  11/11/2016   Procedure: Left Heart Cath and Coronary Angiography;  Surgeon: [name], MD;  Location: [place] CV LAB;  Service: Cardiovascular;  Laterality: N/A;  . CARDIAC CATHETERIZATION N/A 11/11/2016   Procedure: Coronary Balloon Angioplasty;  Surgeon: [name], MD;  Location: [place] CV [facility];  Service: Cardiovascular;  Laterality: N/A;  . COLONOSCOPY N/A 01/14/2019   Procedure: COLONOSCOPY;  Surgeon: Rogene Houston, MD;  [facility];  Service: Endoscopy;  Laterality: N/A;  730  . CORONARY ANGIOPLASTY  11/11/2016   Mid Cx to Dist Cx lesion, 100 %stenosed. This lesion was treated with balloon angioplasty with a 2.0 balloon  . CYSTOSCOPY W/ RETROGRADES N/A 05/05/2015   Procedure: CYSTOSCOPY WITH BILATERAL RETROGRADE PYELOGRAMS AND BLADDER BIOPSIES;  Surgeon: [name], MD;  [facility];  Service: Urology;  Laterality: N/A;  . CYSTOSCOPY W/ RETROGRADES Bilateral 07/31/2018   Procedure: CYSTOSCOPY WITH BILATERAL RETROGRADE PYELOGRAM;  Surgeon: [name], MD;  [facility];  Service: Urology;  Laterality: Bilateral;  . CYSTOSCOPY WITH BIOPSY N/A 07/31/2018   Procedure: CYSTOSCOPY WITH BIOPSY;  Surgeon: [name], MD;  [facility];  Service: Urology;  Laterality: N/A;  . LEFT HEART CATHETERIZATION WITH CORONARY ANGIOGRAM N/A 06/17/2014   Procedure: LEFT HEART CATHETERIZATION WITH CORONARY ANGIOGRAM;  Surgeon: [name], MD;  Location: [facility] CATH LAB;  Service:  Cardiovascular;  Laterality: N/A;  . LEFT HEART CATHETERIZATION WITH CORONARY ANGIOGRAM N/A 02/06/2015   Procedure: LEFT HEART CATHETERIZATION WITH CORONARY ANGIOGRAM;  Surgeon: [name], MD;  Location: [facility] CATH LAB;  Service: Cardiovascular;  Laterality: N/A;  . PERIPHERAL VASCULAR ATHERECTOMY  08/28/2018   Procedure: PERIPHERAL VASCULAR ATHERECTOMY;  Surgeon: Serafina Mitchell, MD;  Location: [place] CV LAB;  Service: Cardiovascular;;  Prox lt.SFA, Distal SFA   . PERIPHERAL VASCULAR INTERVENTION  07/09/2019   Procedure: PERIPHERAL VASCULAR INTERVENTION;  Surgeon: Serafina Mitchell, MD;  Location: [place] CV LAB;  Service: Cardiovascular;;  Lt. SFA  . POLYPECTOMY  01/14/2019   Procedure: POLYPECTOMY;  Surgeon: Rogene Houston, MD;  [facility];  Service: Endoscopy;;  colon  . PROSTATE BIOPSY N/A 03/31/2014   Procedure: BIOPSY TRANSRECTAL ULTRASONIC PROSTATE (TUBP);  Surgeon: [name], MD;  Location: [facility];  Service: Urology;  Laterality: N/A;  . TRANSURETHRAL RESECTION OF BLADDER TUMOR N/A 03/14/2013   Procedure: TRANSURETHRAL RESECTION OF BLADDER TUMOR (TURBT);  Surgeon: [name], MD;  Location: [facility];  Service: Urology;  Laterality: N/A;  1 HR WITH MITOMYCIN INSTILLATION   . TRANSURETHRAL RESECTION OF BLADDER TUMOR N/A 09/30/2013   Procedure: TRANSURETHRAL RESECTION OF BLADDER TUMOR (TURBT);  Surgeon: [name], MD;  Location: [facility];  Service: Urology;  Laterality: N/A;  . TRANSURETHRAL RESECTION OF BLADDER TUMOR N/A 03/31/2014   Procedure: TRANSURETHRAL RESECTION OF BLADDER TUMOR (TURBT);  Surgeon: [name], MD;  [facility];  Service: Urology;  Laterality: N/A;  . TRANSURETHRAL RESECTION OF BLADDER TUMOR N/A 05/31/2015   Procedure: Cystoscopy with clott evacuation and fulgeration of bleeders, retrograde pylegram;  Surgeon: [name], MD;  [facility];  Service: Urology;  Laterality: N/A;    There were no vitals filed for this visit.   Subjective Assessment - 06/22/20 1059    Subjective PT states he did the exercises, he is not sure if it helped or not.  PT feels that he needs a foot MD feels it is not coming from his back it is more his legs.    Pertinent History CAD, HTN, PAD, HOH, history of bladder cancer, NSTEMI    Limitations Walking    How long can you sit comfortably? No issues    How long can you  stand comfortably? No issues    How long can you walk comfortably? 50 steps    Diagnostic tests MRI lumbar spine    Patient Stated Goals To be able to walk 5 miles without his legs burning    Currently in Pain? No/denies              [facility] PT Assessment - 06/22/20 0001      Observation/Other Assessments   Focus on Therapeutic Outcomes (FOTO)  51; 49% affected                          [facility] Adult PT Treatment/Exercise - 06/22/20 0001      Exercises   Exercises Lumbar      Lumbar Exercises: Stretches   Active Hamstring Stretch 3 reps;30 seconds    Single Knee to Chest Stretch Right;Left;3 reps;30 seconds    Double Knee to Chest Stretch 3 reps;30 seconds      Lumbar Exercises: Standing   Heel Raises 10 reps      Lumbar Exercises: Seated   Sit to Stand 10 reps  Lumbar Exercises: Supine   Ab Set 10 reps    Bridge 10 reps                  PT Education - 06/22/20 1116    Education Details HEP    Person(s) Educated Patient    Methods Explanation;Handout;Tactile cues;Verbal cues    Comprehension Verbalized understanding;Returned demonstration            PT Short Term Goals - 06/22/20 1120      PT SHORT TERM GOAL #1   Title Patient will report understanding and regular compliance of HEP in order to improve strength and improve functional mobility.    Time 3    Period Weeks    Status On-going    Target Date 07/08/20      PT SHORT TERM GOAL #2   Title Patient will report an improvement in overall subjective complaint of at least 25% for improved QoL.    Time 3    Period Weeks    Status On-going    Target Date 07/08/20             PT Long Term Goals - 06/22/20 1120      PT LONG TERM GOAL #1   Title Patient will report ability to ambulate for at least 10 minutes with minimal to no leg weakness to improve ease of grocery shopping.    Time 6    Period Weeks    Status On-going      PT LONG TERM GOAL #2   Title Patient will  demonstrate at least 4+/5 strength in all tested lower extremity musculature in order to improve functional mobility and ambulation endurance.    Time 6    Period Weeks    Status On-going      PT LONG TERM GOAL #3   Title Patient will report an improvement in overall subjective complaint of at least 50% for improved QoL.    Time 6    Period Weeks    Status On-going                 Plan - 06/22/20 1117    Clinical Impression Statement Goals reviewed with pt.  Foto completed.  PT is covinced  that his weakness is not from his back. Therapist encouraged pt to participate with PT for a few weeks and see if he can tell any improvement.  Therapist extended pt hep and began education in proper body mechanics for bed mobility.    Personal Factors and Comorbidities Age;Time since onset of injury/illness/exacerbation;Comorbidity 3+    Comorbidities CAD, HTN, PAD, HOH, history of bladder cancer, NSTEMI    Examination-Activity Limitations Locomotion Level;Transfers;Stairs    Examination-Participation Restrictions Meal Prep;Community Activity    Stability/Clinical Decision Making Evolving/Moderate complexity    Rehab Potential Fair    PT Frequency 2x / week    PT Duration 6 weeks    PT Treatment/Interventions ADLs/Self Care Home Management;Aquatic Therapy;Cryotherapy;Electrical Stimulation;Iontophoresis 4mg /ml Dexamethasone;Moist Heat;Traction;DME Instruction;Gait training;Stair training;Functional mobility training;Therapeutic activities;Therapeutic exercise;Balance training;Neuromuscular re-education;Patient/family education;Orthotic Fit/Training;Manual techniques;Passive range of motion;Dry needling;Energy conservation;Taping    PT Next Visit Plan , Review HEP, flexion based low back stretching, mobility exercises. Increasing endurance. Talk to [facility] about plan.    PT Home Exercise Plan 06/17/20: Double knee to chest; 7/18; single knee to chest, hamstirng stretch, ab set, bridge, heel raise, sit  to stand    Consulted and Agree with Plan of Care Patient  Patient will benefit from skilled therapeutic intervention in order to improve the following deficits and impairments:  Abnormal gait, Pain, Improper body mechanics, Decreased mobility, Decreased activity tolerance, Decreased endurance, Decreased strength, Hypomobility, Difficulty walking  Visit Diagnosis: Other abnormalities of gait and mobility  Other symptoms and signs involving the musculoskeletal system     Problem List Patient Active Problem List   Diagnosis Date Noted  . Back pain with radiation 04/27/2020  . PAD (peripheral artery disease) ([place]) 01/01/2020  . Special screening for malignant neoplasms, colon 12/12/2018  . Hypertensive heart disease without heart failure   . Hematuria 05/31/2015  . Gross hematuria 05/28/2015  . HOH (hard of hearing)   . HLD (hyperlipidemia)   . CAD (coronary artery disease)   . Hypertension   . Diabetes mellitus, type 2 ([place])   . Bladder cancer ([place])   . NSTEMI (non-ST elevated myocardial infarction) [facility]) 06/17/2014   Rayetta Humphrey, PT CLT [phone] 06/22/2020, 11:33 AM  [facility] [address] [place], [place], [address] Phone: [phone]   Fax:  [phone]  Name: Kemal Amores MRN: [id number] Date of Birth: May 23, 1946

## 2020-06-24 ENCOUNTER — Ambulatory Visit (HOSPITAL_COMMUNITY): Payer: Medicare HMO | Admitting: Physical Therapy

## 2020-06-24 ENCOUNTER — Encounter (HOSPITAL_COMMUNITY): Payer: Self-pay | Admitting: Physical Therapy

## 2020-06-24 ENCOUNTER — Other Ambulatory Visit: Payer: Self-pay

## 2020-06-24 DIAGNOSIS — R2689 Other abnormalities of gait and mobility: Secondary | ICD-10-CM | POA: Diagnosis not present

## 2020-06-24 DIAGNOSIS — R29898 Other symptoms and signs involving the musculoskeletal system: Secondary | ICD-10-CM | POA: Diagnosis not present

## 2020-06-24 NOTE — Therapy (Signed)
[facility] [place], [place], [address] Phone: [phone]   Fax:  [phone]  Physical Therapy Treatment  Patient Details  Name: Timothy [name] MRN: [id number] Date of Birth: July 15, 1946 Referring Provider (PT): Phylliss Bob, MD   Encounter Date: 06/24/2020   PT End of Session - 06/24/20 1044    Visit Number 3    Number of Visits 12    Date for PT Re-Evaluation 07/29/20    Authorization Type [employer] Medicare HMO    Progress Note Due on Visit 10    PT Start Time 1045    PT Stop Time 1125    PT Time Calculation (min) 40 min    Equipment Utilized During Treatment Gait belt    Activity Tolerance Patient tolerated treatment well    Behavior During Therapy [facility] for tasks assessed/performed           Past Medical History:  Diagnosis Date  . Arthritis   . Bladder cancer ([place])   . CAD (coronary artery disease)    a. 06/2014: NSTEMI s/p PTCA of LCx, 60-80% residual in distal LAD, 70% prox small D1.   b. 02/06/15 NSTEMI  s/p successful PCI/DES to mid RCA  . Diabetes mellitus, type 2 ([place])   . Elevated PSA   . HLD (hyperlipidemia)   . HOH (hard of hearing)   . Hypertension   . NSTEMI (non-ST elevated myocardial infarction) [facility])     Past Surgical History:  Procedure Laterality Date  . ABDOMINAL AORTOGRAM W/LOWER EXTREMITY N/A 08/28/2018   Procedure: ABDOMINAL AORTOGRAM W/LOWER EXTREMITY;  Surgeon: Serafina Mitchell, MD;  Location: [place] CV LAB;  Service: Cardiovascular;  Laterality: N/A;  bilateral  . ABDOMINAL AORTOGRAM W/LOWER EXTREMITY N/A 07/09/2019   Procedure: ABDOMINAL AORTOGRAM W/LOWER EXTREMITY;  Surgeon: Serafina Mitchell, MD;  Location: [place] CV LAB;  Service: Cardiovascular;  Laterality: N/A;  unilateral Lt  . CARDIAC CATHETERIZATION  02/06/2015   Procedure: CORONARY STENT INTERVENTION;  Surgeon: [name], MD;  Location: [facility] CATH LAB;  Service: Cardiovascular;;  Mid RCA  . CARDIAC CATHETERIZATION N/A  11/11/2016   Procedure: Left Heart Cath and Coronary Angiography;  Surgeon: [name], MD;  Location: [place] CV LAB;  Service: Cardiovascular;  Laterality: N/A;  . CARDIAC CATHETERIZATION N/A 11/11/2016   Procedure: Coronary Balloon Angioplasty;  Surgeon: [name], MD;  Location: [place] CV LAB;  Service: Cardiovascular;  Laterality: N/A;  . COLONOSCOPY N/A 01/14/2019   Procedure: COLONOSCOPY;  Surgeon: Rogene Houston, MD;  [facility];  Service: Endoscopy;  Laterality: N/A;  730  . CORONARY ANGIOPLASTY  11/11/2016   Mid Cx to Dist Cx lesion, 100 %stenosed. This lesion was treated with balloon angioplasty with a 2.0 balloon  . CYSTOSCOPY W/ RETROGRADES N/A 05/05/2015   Procedure: CYSTOSCOPY WITH BILATERAL RETROGRADE PYELOGRAMS AND BLADDER BIOPSIES;  Surgeon: [name], MD;  [facility];  Service: Urology;  Laterality: N/A;  . CYSTOSCOPY W/ RETROGRADES Bilateral 07/31/2018   Procedure: CYSTOSCOPY WITH BILATERAL RETROGRADE PYELOGRAM;  Surgeon: [name], MD;  [facility];  Service: Urology;  Laterality: Bilateral;  . CYSTOSCOPY WITH BIOPSY N/A 07/31/2018   Procedure: CYSTOSCOPY WITH BIOPSY;  Surgeon: [name], MD;  [facility];  Service: Urology;  Laterality: N/A;  . LEFT HEART CATHETERIZATION WITH CORONARY ANGIOGRAM N/A 06/17/2014   Procedure: LEFT HEART CATHETERIZATION WITH CORONARY ANGIOGRAM;  Surgeon: [name], MD;  Location: [facility] CATH LAB;  Service:  Cardiovascular;  Laterality: N/A;  . LEFT HEART CATHETERIZATION WITH CORONARY ANGIOGRAM N/A 02/06/2015   Procedure: LEFT HEART CATHETERIZATION WITH CORONARY ANGIOGRAM;  Surgeon: [name], MD;  Location: [facility] CATH LAB;  Service: Cardiovascular;  Laterality: N/A;  . PERIPHERAL VASCULAR ATHERECTOMY  08/28/2018   Procedure: PERIPHERAL VASCULAR ATHERECTOMY;  Surgeon: Serafina Mitchell, MD;  Location: [place] CV LAB;  Service: Cardiovascular;;  Prox lt.SFA, Distal SFA   . PERIPHERAL VASCULAR INTERVENTION  07/09/2019   Procedure: PERIPHERAL VASCULAR INTERVENTION;  Surgeon: Serafina Mitchell, MD;  Location: [place] CV LAB;  Service: Cardiovascular;;  Lt. SFA  . POLYPECTOMY  01/14/2019   Procedure: POLYPECTOMY;  Surgeon: Rogene Houston, MD;  [facility];  Service: Endoscopy;;  colon  . PROSTATE BIOPSY N/A 03/31/2014   Procedure: BIOPSY TRANSRECTAL ULTRASONIC PROSTATE (TUBP);  Surgeon: [name], MD;  Location: [facility];  Service: Urology;  Laterality: N/A;  . TRANSURETHRAL RESECTION OF BLADDER TUMOR N/A 03/14/2013   Procedure: TRANSURETHRAL RESECTION OF BLADDER TUMOR (TURBT);  Surgeon: [name], MD;  Location: [facility];  Service: Urology;  Laterality: N/A;  1 HR WITH MITOMYCIN INSTILLATION   . TRANSURETHRAL RESECTION OF BLADDER TUMOR N/A 09/30/2013   Procedure: TRANSURETHRAL RESECTION OF BLADDER TUMOR (TURBT);  Surgeon: [name], MD;  Location: [facility];  Service: Urology;  Laterality: N/A;  . TRANSURETHRAL RESECTION OF BLADDER TUMOR N/A 03/31/2014   Procedure: TRANSURETHRAL RESECTION OF BLADDER TUMOR (TURBT);  Surgeon: [name], MD;  Location: [facility];  Service: Urology;  Laterality: N/A;  . TRANSURETHRAL RESECTION OF BLADDER TUMOR N/A 05/31/2015   Procedure: Cystoscopy with clott evacuation and fulgeration of bleeders, retrograde pylegram;  Surgeon: [name], MD;  [facility];  Service: Urology;  Laterality: N/A;    There were no vitals filed for this visit.   Subjective Assessment - 06/24/20 1048    Subjective States he feels better and is doing his exercises. He only has difficulties when he walks for long periords of time.    Pertinent History CAD, HTN, PAD, HOH, history of bladder cancer, NSTEMI    Limitations Walking    How long can you sit comfortably? No issues    How long can you stand comfortably? No issues    How long  can you walk comfortably? 50 steps    Diagnostic tests MRI lumbar spine    Patient Stated Goals To be able to walk 5 miles without his legs burning              [facility] PT Assessment - 06/24/20 0001      Assessment   Medical Diagnosis Lumbar Spinal Stenosis    Referring Provider (PT) Phylliss Bob, MD                         [facility] Adult PT Treatment/Exercise - 06/24/20 0001      Ambulation/Gait   Ambulation/Gait Yes    Gait Comments 10 minutes  -- focus on walking upright, "with purpose" and not flat footed/stomping foot on ground.       Lumbar Exercises: Stretches   Conservator, museum/gallery       Lumbar Exercises: Seated   Other Seated Lumbar Exercises seated posture with hips engages (knees not falling out() - tall 8 minutes      Lumbar Exercises: Supine   Bridge 3 seconds;20  reps    Other Supine Lumbar Exercises SLR 4x5 B                   PT Education - 06/24/20 1126    Education Details educated patient in gait mechanics- rationale behind them and how they will help. educated patient in posture and how to focus on that.    Person(s) Educated Patient    Methods Explanation    Comprehension Verbalized understanding            PT Short Term Goals - 06/22/20 1120      PT SHORT TERM GOAL #1   Title Patient will report understanding and regular compliance of HEP in order to improve strength and improve functional mobility.    Time 3    Period Weeks    Status On-going    Target Date 07/08/20      PT SHORT TERM GOAL #2   Title Patient will report an improvement in overall subjective complaint of at least 25% for improved QoL.    Time 3    Period Weeks    Status On-going    Target Date 07/08/20             PT Long Term Goals - 06/22/20 1120      PT LONG TERM GOAL #1   Title Patient will report ability to ambulate for at least 10 minutes with minimal to no leg weakness to  improve ease of grocery shopping.    Time 6    Period Weeks    Status On-going      PT LONG TERM GOAL #2   Title Patient will demonstrate at least 4+/5 strength in all tested lower extremity musculature in order to improve functional mobility and ambulation endurance.    Time 6    Period Weeks    Status On-going      PT LONG TERM GOAL #3   Title Patient will report an improvement in overall subjective complaint of at least 50% for improved QoL.    Time 6    Period Weeks    Status On-going                 Plan - 06/24/20 1045    Clinical Impression Statement Focused on progressing exercises and focus on walking and seated posture. Patient tolerated these well. Will follow up with gait mechanics next session. No increase in symptoms noted during today's session. Will continue to focus on functional strength/endurance and walking mechanics.    Personal Factors and Comorbidities Age;Time since onset of injury/illness/exacerbation;Comorbidity 3+    Comorbidities CAD, HTN, PAD, HOH, history of bladder cancer, NSTEMI    Examination-Activity Limitations Locomotion Level;Transfers;Stairs    Examination-Participation Restrictions Meal Prep;Community Activity    Stability/Clinical Decision Making Evolving/Moderate complexity    Rehab Potential Fair    PT Frequency 2x / week    PT Duration 6 weeks    PT Treatment/Interventions ADLs/Self Care Home Management;Aquatic Therapy;Cryotherapy;Electrical Stimulation;Iontophoresis 4mg /ml Dexamethasone;Moist Heat;Traction;DME Instruction;Gait training;Stair training;Functional mobility training;Therapeutic activities;Therapeutic exercise;Balance training;Neuromuscular re-education;Patient/family education;Orthotic Fit/Training;Manual techniques;Passive range of motion;Dry needling;Energy conservation;Taping    PT Next Visit Plan , Review HEP, flexion based low back stretching, mobility exercises. Increasing endurance. [facility] to [facility] about [facility].    PT  Home Exercise Plan 06/17/20: Double knee to chest; 7/18; single knee to chest, hamstirng stretch, ab set, bridge, heel raise, sit to stand; 7/21 walking mechanics.    Consulted and Agree with Plan of Care Patient  Patient will benefit from skilled therapeutic intervention in order to improve the following deficits and impairments:  Abnormal gait, Pain, Improper body mechanics, Decreased mobility, Decreased activity tolerance, Decreased endurance, Decreased strength, Hypomobility, Difficulty walking  Visit Diagnosis: Other abnormalities of gait and mobility  Other symptoms and signs involving the musculoskeletal system     Problem List Patient Active Problem List   Diagnosis Date Noted  . Back pain with radiation 04/27/2020  . PAD (peripheral artery disease) ([place]) 01/01/2020  . Special screening for malignant neoplasms, colon 12/12/2018  . Hypertensive heart disease without heart failure   . Hematuria 05/31/2015  . Gross hematuria 05/28/2015  . HOH (hard of hearing)   . HLD (hyperlipidemia)   . CAD (coronary artery disease)   . Hypertension   . Diabetes mellitus, type 2 ([place])   . Bladder cancer ([place])   . NSTEMI (non-ST elevated myocardial infarction) ([place]) 06/17/2014   11:32 AM, 06/24/20 Jerene Pitch, DPT Physical Therapy with [facility]  [phone] office  [place] [address] [place], [place], [address] Phone: [phone]   Fax:  [phone]  Name: Timothy [name] MRN: [id number] Date of Birth: 1946/04/04

## 2020-06-25 ENCOUNTER — Other Ambulatory Visit: Payer: Self-pay | Admitting: Cardiology

## 2020-07-01 ENCOUNTER — Encounter (HOSPITAL_COMMUNITY): Payer: Medicare HMO

## 2020-07-03 ENCOUNTER — Encounter (HOSPITAL_COMMUNITY): Payer: Self-pay

## 2020-07-03 ENCOUNTER — Ambulatory Visit (HOSPITAL_COMMUNITY): Payer: Medicare HMO

## 2020-07-03 ENCOUNTER — Other Ambulatory Visit: Payer: Self-pay

## 2020-07-03 DIAGNOSIS — R2689 Other abnormalities of gait and mobility: Secondary | ICD-10-CM | POA: Diagnosis not present

## 2020-07-03 DIAGNOSIS — I2510 Atherosclerotic heart disease of native coronary artery without angina pectoris: Secondary | ICD-10-CM | POA: Diagnosis not present

## 2020-07-03 DIAGNOSIS — R29898 Other symptoms and signs involving the musculoskeletal system: Secondary | ICD-10-CM | POA: Diagnosis not present

## 2020-07-03 DIAGNOSIS — I214 Non-ST elevation (NSTEMI) myocardial infarction: Secondary | ICD-10-CM | POA: Diagnosis not present

## 2020-07-03 DIAGNOSIS — Z720 Tobacco use: Secondary | ICD-10-CM | POA: Diagnosis not present

## 2020-07-03 DIAGNOSIS — E1151 Type 2 diabetes mellitus with diabetic peripheral angiopathy without gangrene: Secondary | ICD-10-CM | POA: Diagnosis not present

## 2020-07-03 NOTE — Therapy (Signed)
Timothy [name], [place], [address] Phone: [phone]   Fax:  [phone]  Physical Therapy Treatment  Patient Details  Name: Timothy [name] MRN: [id number] Date of Birth: Jul 03, 1946 Referring Provider (PT): Phylliss Bob, MD   Encounter Date: 07/03/2020   PT End of Session - 07/03/20 1721    Visit Number 4    Number of Visits 12    Date for PT Re-Evaluation 07/29/20    Authorization Type [employer] HMO    Progress Note Due on Visit 10    PT Start Time 1620    PT Stop Time 1703    PT Time Calculation (min) 43 min    Activity Tolerance Patient tolerated treatment well    Behavior During Therapy [facility] for tasks assessed/performed           Past Medical History:  Diagnosis Date  . Arthritis   . Bladder cancer ([name])   . CAD (coronary artery disease)    a. 06/2014: NSTEMI s/p PTCA of LCx, 60-80% residual in distal LAD, 70% prox small D1.   b. 02/06/15 NSTEMI  s/p successful PCI/DES to mid RCA  . Diabetes mellitus, type 2 ([name])   . Elevated PSA   . HLD (hyperlipidemia)   . HOH (hard of hearing)   . Hypertension   . NSTEMI (non-ST elevated myocardial infarction) [name])     Past Surgical History:  Procedure Laterality Date  . ABDOMINAL AORTOGRAM W/LOWER EXTREMITY N/A 08/28/2018   Procedure: ABDOMINAL AORTOGRAM W/LOWER EXTREMITY;  Surgeon: Serafina Mitchell, MD;  Location: [place] [name];  Service: Cardiovascular;  Laterality: N/A;  bilateral  . ABDOMINAL AORTOGRAM W/LOWER EXTREMITY N/A 07/09/2019   Procedure: ABDOMINAL AORTOGRAM W/LOWER EXTREMITY;  Surgeon: Serafina Mitchell, MD;  Location: [place] [facility];  Service: Cardiovascular;  Laterality: N/A;  unilateral Lt  . CARDIAC CATHETERIZATION  02/06/2015   Procedure: CORONARY STENT INTERVENTION;  Surgeon: [name], MD;  Location: [facility] CATH LAB;  Service: Cardiovascular;;  Mid RCA  . CARDIAC CATHETERIZATION N/A 11/11/2016   Procedure: Left Heart Cath and  Coronary Angiography;  Surgeon: [name], MD;  Location: [place] CV LAB;  Service: Cardiovascular;  Laterality: N/A;  . CARDIAC CATHETERIZATION N/A 11/11/2016   Procedure: Coronary Balloon Angioplasty;  Surgeon: [name], MD;  Location: [place] CV [facility];  Service: Cardiovascular;  Laterality: N/A;  . COLONOSCOPY N/A 01/14/2019   Procedure: COLONOSCOPY;  Surgeon: Rogene Houston, MD;  [facility];  Service: Endoscopy;  Laterality: N/A;  730  . CORONARY ANGIOPLASTY  11/11/2016   Mid Cx to Dist Cx lesion, 100 %stenosed. This lesion was treated with balloon angioplasty with a 2.0 balloon  . CYSTOSCOPY W/ RETROGRADES N/A 05/05/2015   Procedure: CYSTOSCOPY WITH BILATERAL RETROGRADE PYELOGRAMS AND BLADDER BIOPSIES;  Surgeon: [name], MD;  Location: [facility];  Service: Urology;  Laterality: N/A;  . CYSTOSCOPY W/ RETROGRADES Bilateral 07/31/2018   Procedure: CYSTOSCOPY WITH BILATERAL RETROGRADE PYELOGRAM;  Surgeon: [name], MD;  [facility]: [facility];  Service: Urology;  Laterality: Bilateral;  . CYSTOSCOPY WITH BIOPSY N/A 07/31/2018   Procedure: CYSTOSCOPY WITH BIOPSY;  Surgeon: [name], MD;  [facility];  Service: Urology;  Laterality: N/A;  . LEFT HEART CATHETERIZATION WITH CORONARY ANGIOGRAM N/A 06/17/2014   Procedure: LEFT HEART CATHETERIZATION WITH CORONARY ANGIOGRAM;  Surgeon: [name], MD;  Location: [name] CATH LAB;  Service: Cardiovascular;  Laterality: N/A;  . LEFT HEART CATHETERIZATION  WITH CORONARY ANGIOGRAM N/A 02/06/2015   Procedure: LEFT HEART CATHETERIZATION WITH CORONARY ANGIOGRAM;  Surgeon: [name], MD;  Location: [place] [name] CATH LAB;  Service: Cardiovascular;  Laterality: N/A;  . PERIPHERAL VASCULAR ATHERECTOMY  08/28/2018   Procedure: PERIPHERAL VASCULAR ATHERECTOMY;  Surgeon: Serafina Mitchell, MD;  Location: [place] [facility] LAB;  Service: Cardiovascular;;  Prox lt.SFA, Distal SFA  . PERIPHERAL VASCULAR INTERVENTION   07/09/2019   Procedure: PERIPHERAL VASCULAR INTERVENTION;  Surgeon: Serafina Mitchell, MD;  Location: [place] CV LAB;  Service: Cardiovascular;;  Lt. SFA  . POLYPECTOMY  01/14/2019   Procedure: POLYPECTOMY;  Surgeon: Rogene Houston, MD;  [facility];  Service: Endoscopy;;  colon  . PROSTATE BIOPSY N/A 03/31/2014   Procedure: BIOPSY TRANSRECTAL ULTRASONIC PROSTATE (TUBP);  Surgeon: [name], MD;  Location: [name];  Service: Urology;  Laterality: N/A;  . TRANSURETHRAL RESECTION OF BLADDER TUMOR N/A 03/14/2013   Procedure: TRANSURETHRAL RESECTION OF BLADDER TUMOR (TURBT);  Surgeon: [name], MD;  Location: [name];  Service: Urology;  Laterality: N/A;  1 HR WITH MITOMYCIN INSTILLATION   . TRANSURETHRAL RESECTION OF BLADDER TUMOR N/A 09/30/2013   Procedure: TRANSURETHRAL RESECTION OF BLADDER TUMOR (TURBT);  Surgeon: [name], MD;  Location: [name];  Service: Urology;  Laterality: N/A;  . TRANSURETHRAL RESECTION OF BLADDER TUMOR N/A 03/31/2014   Procedure: TRANSURETHRAL RESECTION OF BLADDER TUMOR (TURBT);  Surgeon: [name], MD;  Location: [place] [name];  Service: Urology;  Laterality: N/A;  . TRANSURETHRAL RESECTION OF BLADDER TUMOR N/A 05/31/2015   Procedure: Cystoscopy with clott evacuation and fulgeration of bleeders, retrograde pylegram;  Surgeon: [name], MD;  [facility]: [facility];  Service: Urology;  Laterality: N/A;    There were no vitals filed for this visit.   Subjective Assessment - 07/03/20 1630    Subjective Pt stated he is feeling good today, just feels weak.    Pertinent History CAD, HTN, PAD, HOH, history of bladder cancer, NSTEMI    Patient Stated Goals To be able to walk 5 miles without his legs burning    Currently in Pain? No/denies                             [facility] Adult PT Treatment/Exercise - 07/03/20 0001      Ambulation/Gait    Ambulation/Gait Yes    Ambulation Distance (Feet) 452 Feet    Assistive device None    Gait Comments focus on walking upright, "with purpose" and not flat footed/stomping foot on ground.       Exercises   Exercises Lumbar      Lumbar Exercises: Stretches   Active Hamstring Stretch 3 reps;30 seconds    Active Hamstring Stretch Limitations supine, hands behind knee    Single Knee to Chest Stretch 2 reps;30 seconds    Lower Trunk Rotation 5 reps;10 seconds      Lumbar Exercises: Aerobic   Tread Mill 3'30" flat surface at 1.4spm, increased 3 degrees elevation for another 3'30".  Reports burning/fatigue Rt glut 74min into gait that was resolved following      Lumbar Exercises: Seated   Sit to Stand 10 reps   no HHA, eccentric control     Lumbar Exercises: Supine   Bridge 10 reps;5 seconds   2 sets    Other Supine Lumbar Exercises SLR 10x      Lumbar Exercises: Sidelying  Clam 10 reps;3 seconds                    PT Short Term Goals - 06/22/20 1120      PT SHORT TERM GOAL #1   Title Patient will report understanding and regular compliance of HEP in order to improve strength and improve functional mobility.    Time 3    Period Weeks    Status On-going    Target Date 07/08/20      PT SHORT TERM GOAL #2   Title Patient will report an improvement in overall subjective complaint of at least 25% for improved QoL.    Time 3    Period Weeks    Status On-going    Target Date 07/08/20             PT Long Term Goals - 06/22/20 1120      PT LONG TERM GOAL #1   Title Patient will report ability to ambulate for at least 10 minutes with minimal to no leg weakness to improve ease of grocery shopping.    Time 6    Period Weeks    Status On-going      PT LONG TERM GOAL #2   Title Patient will demonstrate at least 4+/5 strength in all tested lower extremity musculature in order to improve functional mobility and ambulation endurance.    Time 6    Period Weeks    Status  On-going      PT LONG TERM GOAL #3   Title Patient will report an improvement in overall subjective complaint of at least 50% for improved QoL.    Time 6    Period Weeks    Status On-going                 Plan - 07/03/20 1702    Clinical Impression Statement Continued session foucs with gait training to improve posture and mechanics as well as core and proximal strengthening.  Began gait training on treadmill wiht visual cueing to improve equal stride length and posture.  Pt fatigued following treadmill and reports some burning Rt glut at 6 min into gait.  Burning was totally resolved following sitting for short duration.  Added LTR for gluteal stretches wiht reports of relief as well as gluteal strengthening with some cueing for form and mechanics.  No reports of pain at EOS.    Personal Factors and Comorbidities Age;Time since onset of injury/illness/exacerbation;Comorbidity 3+    Comorbidities CAD, HTN, PAD, HOH, history of bladder cancer, NSTEMI    Examination-Activity Limitations Locomotion Level;Transfers;Stairs    Examination-Participation Restrictions Meal Prep;Community Activity    Stability/Clinical Decision Making Evolving/Moderate complexity    Clinical Decision Making Moderate    Rehab Potential Fair    PT Frequency 2x / week    PT Duration 6 weeks    PT Treatment/Interventions ADLs/Self Care Home Management;Aquatic Therapy;Cryotherapy;Electrical Stimulation;Iontophoresis 4mg /ml Dexamethasone;Moist Heat;Traction;DME Instruction;Gait training;Stair training;Functional mobility training;Therapeutic activities;Therapeutic exercise;Balance training;Neuromuscular re-education;Patient/family education;Orthotic Fit/Training;Manual techniques;Passive range of motion;Dry needling;Energy conservation;Taping    PT Next Visit Plan , Review HEP, flexion based low back stretching, mobility exercises. Increasing endurance. Talk to [name] about plan.    PT Home Exercise Plan 06/17/20: Double  knee to chest; 7/18; single knee to chest, hamstirng stretch, ab set, bridge, heel raise, sit to stand; 7/21 walking mechanics.           Patient will benefit from skilled therapeutic intervention in order to improve Timothy following deficits and  impairments:  Abnormal gait, Pain, Improper body mechanics, Decreased mobility, Decreased activity tolerance, Decreased endurance, Decreased strength, Hypomobility, Difficulty walking  Visit Diagnosis: Other symptoms and signs involving Timothy musculoskeletal system  Other abnormalities of gait and mobility     Problem List Patient Active Problem List   Diagnosis Date Noted  . Back pain with radiation 04/27/2020  . PAD (peripheral artery disease) ([place]) 01/01/2020  . Special screening for malignant neoplasms, colon 12/12/2018  . Hypertensive heart disease without heart failure   . Hematuria 05/31/2015  . Gross hematuria 05/28/2015  . HOH (hard of hearing)   . HLD (hyperlipidemia)   . CAD (coronary artery disease)   . Hypertension   . Diabetes mellitus, type 2 ([place])   . Bladder cancer ([place])   . NSTEMI (non-ST elevated myocardial infarction) ([place]) 06/17/2014   Ihor Austin, LPTA/CLT; CBIS [phone]  Aldona Lento 07/03/2020, 5:23 PM  [facility] [address] [place], [place], [address] Phone: [phone]   Fax:  [phone]  Name: Kjuan Seipp MRN: [id number] Date of Birth: 29-Dec-1945

## 2020-07-07 ENCOUNTER — Other Ambulatory Visit: Payer: Self-pay

## 2020-07-07 ENCOUNTER — Ambulatory Visit (HOSPITAL_COMMUNITY): Payer: Medicare HMO | Attending: Orthopedic Surgery | Admitting: Physical Therapy

## 2020-07-07 DIAGNOSIS — R29898 Other symptoms and signs involving the musculoskeletal system: Secondary | ICD-10-CM | POA: Insufficient documentation

## 2020-07-07 DIAGNOSIS — R2689 Other abnormalities of gait and mobility: Secondary | ICD-10-CM | POA: Diagnosis not present

## 2020-07-07 NOTE — Therapy (Signed)
[place], [place], [address] Phone: [phone]   Fax:  [phone]  Physical Therapy Treatment  Patient Details  Name: [name] MRN: [id number] Date of Birth: Mar 27, 1946 Referring Provider (PT): Phylliss Bob, MD   Encounter Date: 07/07/2020   PT End of Session - 07/07/20 1549    Visit Number 5    Number of Visits 12    Date for PT Re-Evaluation 07/29/20    Authorization Type [employer] Medicare HMO    Progress Note Due on Visit 10    PT Start Time 1005    PT Stop Time 1048    PT Time Calculation (min) 43 min    Activity Tolerance Patient tolerated treatment well    Behavior During Therapy [facility] for tasks assessed/performed           Past Medical History:  Diagnosis Date  . Arthritis   . Bladder cancer ([place])   . CAD (coronary artery disease)    a. 06/2014: NSTEMI s/p PTCA of LCx, 60-80% residual in distal LAD, 70% prox small D1.   b. 02/06/15 NSTEMI  s/p successful PCI/DES to mid RCA  . Diabetes mellitus, type 2 ([place])   . Elevated PSA   . HLD (hyperlipidemia)   . HOH (hard of hearing)   . Hypertension   . NSTEMI (non-ST elevated myocardial infarction) [facility])     Past Surgical History:  Procedure Laterality Date  . ABDOMINAL AORTOGRAM W/LOWER EXTREMITY N/A 08/28/2018   Procedure: ABDOMINAL AORTOGRAM W/LOWER EXTREMITY;  Surgeon: Serafina Mitchell, MD;  Location: [place] CV LAB;  Service: Cardiovascular;  Laterality: N/A;  bilateral  . ABDOMINAL AORTOGRAM W/LOWER EXTREMITY N/A 07/09/2019   Procedure: ABDOMINAL AORTOGRAM W/LOWER EXTREMITY;  Surgeon: Serafina Mitchell, MD;  Location: [place] [facility] LAB;  Service: Cardiovascular;  Laterality: N/A;  unilateral Lt  . CARDIAC CATHETERIZATION  02/06/2015   Procedure: CORONARY STENT INTERVENTION;  Surgeon: [name], MD;  Location: [facility] CATH LAB;  Service: Cardiovascular;;  Mid RCA  . CARDIAC CATHETERIZATION N/A 11/11/2016   Procedure: Left Heart Cath and  Coronary Angiography;  Surgeon: [name], MD;  Location: [place] CV LAB;  Service: Cardiovascular;  Laterality: N/A;  . CARDIAC CATHETERIZATION N/A 11/11/2016   Procedure: Coronary Balloon Angioplasty;  Surgeon: [name], MD;  Location: [place] CV LAB;  Service: Cardiovascular;  Laterality: N/A;  . COLONOSCOPY N/A 01/14/2019   Procedure: COLONOSCOPY;  Surgeon: Rogene Houston, MD;  [facility];  Service: Endoscopy;  Laterality: N/A;  730  . CORONARY ANGIOPLASTY  11/11/2016   Mid Cx to Dist Cx lesion, 100 %stenosed. This lesion was treated with balloon angioplasty with a 2.0 balloon  . CYSTOSCOPY W/ RETROGRADES N/A 05/05/2015   Procedure: CYSTOSCOPY WITH BILATERAL RETROGRADE PYELOGRAMS AND BLADDER BIOPSIES;  Surgeon: [name], MD;  [facility];  Service: Urology;  Laterality: N/A;  . CYSTOSCOPY W/ RETROGRADES Bilateral 07/31/2018   Procedure: CYSTOSCOPY WITH BILATERAL RETROGRADE PYELOGRAM;  Surgeon: [name], MD;  [facility];  Service: Urology;  Laterality: Bilateral;  . CYSTOSCOPY WITH BIOPSY N/A 07/31/2018   Procedure: CYSTOSCOPY WITH BIOPSY;  Surgeon: [name], MD;  [facility];  Service: Urology;  Laterality: N/A;  . LEFT HEART CATHETERIZATION WITH CORONARY ANGIOGRAM N/A 06/17/2014   Procedure: LEFT HEART CATHETERIZATION WITH CORONARY ANGIOGRAM;  Surgeon: [name], MD;  Location: [place] CATH LAB;  Service: Cardiovascular;  Laterality: N/A;  . LEFT HEART CATHETERIZATION  WITH CORONARY ANGIOGRAM N/A 02/06/2015   Procedure: LEFT HEART CATHETERIZATION WITH CORONARY ANGIOGRAM;  Surgeon: [name], MD;  Location: [facility] CATH LAB;  Service: Cardiovascular;  Laterality: N/A;  . PERIPHERAL VASCULAR ATHERECTOMY  08/28/2018   Procedure: PERIPHERAL VASCULAR ATHERECTOMY;  Surgeon: Serafina Mitchell, MD;  Location: [place] CV LAB;  Service: Cardiovascular;;  Prox lt.SFA, Distal SFA  . PERIPHERAL VASCULAR INTERVENTION   07/09/2019   Procedure: PERIPHERAL VASCULAR INTERVENTION;  Surgeon: Serafina Mitchell, MD;  Location: [place] CV LAB;  Service: Cardiovascular;;  Lt. SFA  . POLYPECTOMY  01/14/2019   Procedure: POLYPECTOMY;  Surgeon: Rogene Houston, MD;  [facility];  Service: Endoscopy;;  colon  . PROSTATE BIOPSY N/A 03/31/2014   Procedure: BIOPSY TRANSRECTAL ULTRASONIC PROSTATE (TUBP);  Surgeon: [name], MD;  Location: [facility];  Service: Urology;  Laterality: N/A;  . TRANSURETHRAL RESECTION OF BLADDER TUMOR N/A 03/14/2013   Procedure: TRANSURETHRAL RESECTION OF BLADDER TUMOR (TURBT);  Surgeon: [name], MD;  Location: [facility];  Service: Urology;  Laterality: N/A;  1 HR WITH MITOMYCIN INSTILLATION   . TRANSURETHRAL RESECTION OF BLADDER TUMOR N/A 09/30/2013   Procedure: TRANSURETHRAL RESECTION OF BLADDER TUMOR (TURBT);  Surgeon: [name], MD;  [facility]  Service: Urology;  Laterality: N/A;  . TRANSURETHRAL RESECTION OF BLADDER TUMOR N/A 03/31/2014   Procedure: TRANSURETHRAL RESECTION OF BLADDER TUMOR (TURBT);  Surgeon: [name], MD;  Location: [facility];  Service: Urology;  Laterality: N/A;  . TRANSURETHRAL RESECTION OF BLADDER TUMOR N/A 05/31/2015   Procedure: Cystoscopy with clott evacuation and fulgeration of bleeders, retrograde pylegram;  Surgeon: [name], MD;  [facility];  Service: Urology;  Laterality: N/A;    There were no vitals filed for this visit.   Subjective Assessment - 07/07/20 1009    Subjective pt states no pain or issues.    Currently in Pain? No/denies                             [facility] Adult PT Treatment/Exercise - 07/07/20 1545      Lumbar Exercises: Aerobic   Tread Mill 3'30" flat surface at 1.4spm, increased 3 degrees elevation for another 3'30". completed 2 bouts of this with standing rest break between      Lumbar  Exercises: Standing   Heel Raises 15 reps    Functional Squats 15 reps    Forward Lunge 15 reps    Forward Lunge Limitations bilaterally onto 4" without UE assist    Other Standing Lumbar Exercises hip abduction, extension 15 reps each      Lumbar Exercises: Seated   Sit to Stand 10 reps                    PT Short Term Goals - 06/22/20 1120      PT SHORT TERM GOAL #1   Title Patient will report understanding and regular compliance of HEP in order to improve strength and improve functional mobility.    Time 3    Period Weeks    Status On-going    Target Date 07/08/20      PT SHORT TERM GOAL #2   Title Patient will report an improvement in overall subjective complaint of at least 25% for improved QoL.    Time 3    Period Weeks    Status On-going    Target  Date 07/08/20             PT Long Term Goals - 06/22/20 1120      PT LONG TERM GOAL #1   Title Patient will report ability to ambulate for at least 10 minutes with minimal to no leg weakness to improve ease of grocery shopping.    Time 6    Period Weeks    Status On-going      PT LONG TERM GOAL #2   Title Patient will demonstrate at least 4+/5 strength in all tested lower extremity musculature in order to improve functional mobility and ambulation endurance.    Time 6    Period Weeks    Status On-going      PT LONG TERM GOAL #3   Title Patient will report an improvement in overall subjective complaint of at least 50% for improved QoL.    Time 6    Period Weeks    Status On-going                 Plan - 07/07/20 1545    Clinical Impression Statement Pt without pain today so progressed to incorportate more standing and functional actvities.  Completed 2 bouts of walking as well to work on improving his activitiy tolerance.  PT able to complete all these new activities without rest break and without return of pain.    Personal Factors and Comorbidities Age;Time since onset of  injury/illness/exacerbation;Comorbidity 3+    Comorbidities CAD, HTN, PAD, HOH, history of bladder cancer, NSTEMI    Examination-Activity Limitations Locomotion Level;Transfers;Stairs    Examination-Participation Restrictions Meal Prep;Community Activity    Stability/Clinical Decision Making Evolving/Moderate complexity    Rehab Potential Fair    PT Frequency 2x / week    PT Duration 6 weeks    PT Treatment/Interventions ADLs/Self Care Home Management;Aquatic Therapy;Cryotherapy;Electrical Stimulation;Iontophoresis 4mg /ml Dexamethasone;Moist Heat;Traction;DME Instruction;Gait training;Stair training;Functional mobility training;Therapeutic activities;Therapeutic exercise;Balance training;Neuromuscular re-education;Patient/family education;Orthotic Fit/Training;Manual techniques;Passive range of motion;Dry needling;Energy conservation;Taping    PT Next Visit Plan flexion based low back stretching, mobility exercises. Increasing endurance.    PT Home Exercise Plan 06/17/20: Double knee to chest; 7/18; single knee to chest, hamstirng stretch, ab set, bridge, heel raise, sit to stand; 7/21 walking mechanics.           Patient will benefit from skilled therapeutic intervention in order to improve the following deficits and impairments:  Abnormal gait, Pain, Improper body mechanics, Decreased mobility, Decreased activity tolerance, Decreased endurance, Decreased strength, Hypomobility, Difficulty walking  Visit Diagnosis: Other abnormalities of gait and mobility  Other symptoms and signs involving the musculoskeletal system     Problem List Patient Active Problem List   Diagnosis Date Noted  . Back pain with radiation 04/27/2020  . PAD (peripheral artery disease) ([place]) 01/01/2020  . Special screening for malignant neoplasms, colon 12/12/2018  . Hypertensive heart disease without heart failure   . Hematuria 05/31/2015  . Gross hematuria 05/28/2015  . HOH (hard of hearing)   . HLD  (hyperlipidemia)   . CAD (coronary artery disease)   . Hypertension   . Diabetes mellitus, type 2 ([place])   . Bladder cancer ([place])   . NSTEMI (non-ST elevated myocardial infarction) ([place]) 06/17/2014   Teena Irani, PTA/CLT [phone]  Teena Irani 07/07/2020, 3:50 PM  [facility] [address] [place], [place], [address] Phone: [phone]   Fax:  [phone]  Name: [place] [name] MRN: [id number] Date of Birth: September 01, 1946

## 2020-07-09 ENCOUNTER — Ambulatory Visit (HOSPITAL_COMMUNITY): Payer: Medicare HMO | Admitting: Physical Therapy

## 2020-07-09 ENCOUNTER — Other Ambulatory Visit: Payer: Self-pay

## 2020-07-09 DIAGNOSIS — R2689 Other abnormalities of gait and mobility: Secondary | ICD-10-CM

## 2020-07-09 DIAGNOSIS — R29898 Other symptoms and signs involving the musculoskeletal system: Secondary | ICD-10-CM

## 2020-07-09 NOTE — Therapy (Signed)
[place], [place], [address] Phone: [phone]   Fax:  [phone]  Physical Therapy Treatment  Patient Details  Name: Timothy [name] MRN: [id number] Date of Birth: November 27, 1946 Referring Provider (PT): Phylliss Bob, MD   Encounter Date: 07/09/2020   PT End of Session - 07/09/20 1129    Visit Number 6    Number of Visits 12    Date for PT Re-Evaluation 07/29/20    Authorization Type [employer] HMO    Progress Note Due on Visit 10    PT Start Time 1051    PT Stop Time 1132    PT Time Calculation (min) 41 min    Activity Tolerance Patient tolerated treatment well    Behavior During Therapy [facility] for tasks assessed/performed           Past Medical History:  Diagnosis Date  . Arthritis   . Bladder cancer ([place])   . CAD (coronary artery disease)    a. 06/2014: NSTEMI s/p PTCA of LCx, 60-80% residual in distal LAD, 70% prox small D1.   b. 02/06/15 NSTEMI  s/p successful PCI/DES to mid RCA  . Diabetes mellitus, type 2 ([place])   . Elevated PSA   . HLD (hyperlipidemia)   . HOH (hard of hearing)   . Hypertension   . NSTEMI (non-ST elevated myocardial infarction) [facility])     Past Surgical History:  Procedure Laterality Date  . ABDOMINAL AORTOGRAM W/LOWER EXTREMITY N/A 08/28/2018   Procedure: ABDOMINAL AORTOGRAM W/LOWER EXTREMITY;  Surgeon: Serafina Mitchell, MD;  Location: [place] CV LAB;  Service: Cardiovascular;  Laterality: N/A;  bilateral  . ABDOMINAL AORTOGRAM W/LOWER EXTREMITY N/A 07/09/2019   Procedure: ABDOMINAL AORTOGRAM W/LOWER EXTREMITY;  Surgeon: Serafina Mitchell, MD;  Location: [place] CV LAB;  Service: Cardiovascular;  Laterality: N/A;  unilateral Lt  . CARDIAC CATHETERIZATION  02/06/2015   Procedure: CORONARY STENT INTERVENTION;  Surgeon: [name], MD;  Location: [facility] CATH LAB;  Service: Cardiovascular;;  Mid RCA  . CARDIAC CATHETERIZATION N/A 11/11/2016   Procedure: Left Heart Cath and  Coronary Angiography;  Surgeon: [name], MD;  Location: [place] CV LAB;  Service: Cardiovascular;  Laterality: N/A;  . CARDIAC CATHETERIZATION N/A 11/11/2016   Procedure: Coronary Balloon Angioplasty;  Surgeon: [name], MD;  Location: [place] CV LAB;  Service: Cardiovascular;  Laterality: N/A;  . COLONOSCOPY N/A 01/14/2019   Procedure: COLONOSCOPY;  Surgeon: Rogene Houston, MD;  [facility];  Service: Endoscopy;  Laterality: N/A;  730  . CORONARY ANGIOPLASTY  11/11/2016   Mid Cx to Dist Cx lesion, 100 %stenosed. This lesion was treated with balloon angioplasty with a 2.0 balloon  . CYSTOSCOPY W/ RETROGRADES N/A 05/05/2015   Procedure: CYSTOSCOPY WITH BILATERAL RETROGRADE PYELOGRAMS AND BLADDER BIOPSIES;  Surgeon: [name], MD;  [facility]: [facility];  Service: Urology;  Laterality: N/A;  . CYSTOSCOPY W/ RETROGRADES Bilateral 07/31/2018   Procedure: CYSTOSCOPY WITH BILATERAL RETROGRADE PYELOGRAM;  Surgeon: [name], MD;  [facility];  Service: Urology;  Laterality: Bilateral;  . CYSTOSCOPY WITH BIOPSY N/A 07/31/2018   Procedure: CYSTOSCOPY WITH BIOPSY;  Surgeon: [name], MD;  [facility];  Service: Urology;  Laterality: N/A;  . LEFT HEART CATHETERIZATION WITH CORONARY ANGIOGRAM N/A 06/17/2014   Procedure: LEFT HEART CATHETERIZATION WITH CORONARY ANGIOGRAM;  Surgeon: [name], MD;  Location: [facility] CATH LAB;  Service: Cardiovascular;  Laterality: N/A;  . LEFT HEART CATHETERIZATION  WITH CORONARY ANGIOGRAM N/A 02/06/2015   Procedure: LEFT HEART CATHETERIZATION WITH CORONARY ANGIOGRAM;  Surgeon: [name], MD;  Location: [facility] CATH LAB;  Service: Cardiovascular;  Laterality: N/A;  . PERIPHERAL VASCULAR ATHERECTOMY  08/28/2018   Procedure: PERIPHERAL VASCULAR ATHERECTOMY;  Surgeon: Serafina Mitchell, MD;  Location: [place] CV LAB;  Service: Cardiovascular;;  Prox lt.SFA, Distal SFA  . PERIPHERAL VASCULAR INTERVENTION   07/09/2019   Procedure: PERIPHERAL VASCULAR INTERVENTION;  Surgeon: Serafina Mitchell, MD;  Location: [place] CV LAB;  Service: Cardiovascular;;  Lt. SFA  . POLYPECTOMY  01/14/2019   Procedure: POLYPECTOMY;  Surgeon: Rogene Houston, MD;  [facility];  Service: Endoscopy;;  colon  . PROSTATE BIOPSY N/A 03/31/2014   Procedure: BIOPSY TRANSRECTAL ULTRASONIC PROSTATE (TUBP);  Surgeon: [name], MD;  Location: [facility];  Service: Urology;  Laterality: N/A;  . TRANSURETHRAL RESECTION OF BLADDER TUMOR N/A 03/14/2013   Procedure: TRANSURETHRAL RESECTION OF BLADDER TUMOR (TURBT);  Surgeon: [name], MD;  Location: [facility];  Service: Urology;  Laterality: N/A;  1 HR WITH MITOMYCIN INSTILLATION   . TRANSURETHRAL RESECTION OF BLADDER TUMOR N/A 09/30/2013   Procedure: TRANSURETHRAL RESECTION OF BLADDER TUMOR (TURBT);  Surgeon: [name], MD;  Location: [facility];  Service: Urology;  Laterality: N/A;  . TRANSURETHRAL RESECTION OF BLADDER TUMOR N/A 03/31/2014   Procedure: TRANSURETHRAL RESECTION OF BLADDER TUMOR (TURBT);  Surgeon: [name], MD;  Location: [facility];  Service: Urology;  Laterality: N/A;  . TRANSURETHRAL RESECTION OF BLADDER TUMOR N/A 05/31/2015   Procedure: Cystoscopy with clott evacuation and fulgeration of bleeders, retrograde pylegram;  Surgeon: [name], MD;  [facility];  Service: Urology;  Laterality: N/A;    There were no vitals filed for this visit.   Subjective Assessment - 07/09/20 1124    Subjective pt states he is doing well today.  States is he rest some he can do more.  States he feels he is getting better.    Currently in Pain? No/denies                             [facility] Adult PT Treatment/Exercise - 07/09/20 0001      Lumbar Exercises: Aerobic   Tread Mill 4 minutes flat surface at 1.16mph, increased 3 degrees elevation for  another 4 minutes. completed 2 bouts of this with standing rest break between      Lumbar Exercises: Standing   Heel Raises 15 reps    Functional Squats 15 reps    Forward Lunge 15 reps    Forward Lunge Limitations bilaterally onto 4" without UE assist    Other Standing Lumbar Exercises hip abduction, extension 15 reps each                    PT Short Term Goals - 06/22/20 1120      PT SHORT TERM GOAL #1   Title Patient will report understanding and regular compliance of HEP in order to improve strength and improve functional mobility.    Time 3    Period Weeks    Status On-going    Target Date 07/08/20      PT SHORT TERM GOAL #2   Title Patient will report an improvement in overall subjective complaint of at least 25% for improved QoL.    Time 3    Period Weeks  Status On-going    Target Date 07/08/20             PT Long Term Goals - 06/22/20 1120      PT LONG TERM GOAL #1   Title Patient will report ability to ambulate for at least 10 minutes with minimal to no leg weakness to improve ease of grocery shopping.    Time 6    Period Weeks    Status On-going      PT LONG TERM GOAL #2   Title Patient will demonstrate at least 4+/5 strength in all tested lower extremity musculature in order to improve functional mobility and ambulation endurance.    Time 6    Period Weeks    Status On-going      PT LONG TERM GOAL #3   Title Patient will report an improvement in overall subjective complaint of at least 50% for improved QoL.    Time 6    Period Weeks    Status On-going                 Plan - 07/09/20 1129    Clinical Impression Statement Continued with focus on improving LE strength and activity tolerance.  Able to increase walking time to 4 minutes (8 minutes total each bout) and complete 2 bouts this session.  Pt with noted fatigue in glutes but no pain or issues with his legs this session.  Less cues to take larger steps on treadmill as well.   Continued to require cues for correct form and posturing with standing exercises.    Personal Factors and Comorbidities Age;Time since onset of injury/illness/exacerbation;Comorbidity 3+    Comorbidities CAD, HTN, PAD, HOH, history of bladder cancer, NSTEMI    Examination-Activity Limitations Locomotion Level;Transfers;Stairs    Examination-Participation Restrictions Meal Prep;Community Activity    Stability/Clinical Decision Making Evolving/Moderate complexity    Rehab Potential Fair    PT Frequency 2x / week    PT Duration 6 weeks    PT Treatment/Interventions ADLs/Self Care Home Management;Aquatic Therapy;Cryotherapy;Electrical Stimulation;Iontophoresis 4mg /ml Dexamethasone;Moist Heat;Traction;DME Instruction;Gait training;Stair training;Functional mobility training;Therapeutic activities;Therapeutic exercise;Balance training;Neuromuscular re-education;Patient/family education;Orthotic Fit/Training;Manual techniques;Passive range of motion;Dry needling;Energy conservation;Taping    PT Next Visit Plan flexion based low back stretching, mobility exercises. Increasing endurance.    PT Home Exercise Plan 06/17/20: Double knee to chest; 7/18; single knee to chest, hamstirng stretch, ab set, bridge, heel raise, sit to stand; 7/21 walking mechanics.           Patient will benefit from skilled therapeutic intervention in order to improve the following deficits and impairments:  Abnormal gait, Pain, Improper body mechanics, Decreased mobility, Decreased activity tolerance, Decreased endurance, Decreased strength, Hypomobility, Difficulty walking  Visit Diagnosis: Other abnormalities of gait and mobility  Other symptoms and signs involving the musculoskeletal system     Problem List Patient Active Problem List   Diagnosis Date Noted  . Back pain with radiation 04/27/2020  . PAD (peripheral artery disease) ([place]) 01/01/2020  . Special screening for malignant neoplasms, colon 12/12/2018  .  Hypertensive heart disease without heart failure   . Hematuria 05/31/2015  . Gross hematuria 05/28/2015  . HOH (hard of hearing)   . HLD (hyperlipidemia)   . CAD (coronary artery disease)   . Hypertension   . Diabetes mellitus, type 2 ([place])   . Bladder cancer ([place])   . NSTEMI (non-ST elevated myocardial infarction) ([place]) 06/17/2014   Teena Irani, PTA/CLT [phone]  Mare Ferrari, Kyrus Hyde B 07/09/2020, 11:30 AM  [facility]  [place], [place], [address] Phone: [phone]   Fax:  [phone]  Name: Timothy [name] MRN: [id number] Date of Birth: 03/07/1946

## 2020-07-13 ENCOUNTER — Telehealth (HOSPITAL_COMMUNITY): Payer: Self-pay | Admitting: Physical Therapy

## 2020-07-13 NOTE — Telephone Encounter (Signed)
pt cancelled appts for this week because he has a hurt foot

## 2020-07-14 ENCOUNTER — Ambulatory Visit (HOSPITAL_COMMUNITY): Payer: Medicare HMO | Admitting: Physical Therapy

## 2020-07-16 ENCOUNTER — Ambulatory Visit (HOSPITAL_COMMUNITY): Payer: Medicare HMO | Admitting: Physical Therapy

## 2020-07-20 ENCOUNTER — Telehealth (HOSPITAL_COMMUNITY): Payer: Self-pay | Admitting: Physical Therapy

## 2020-07-20 DIAGNOSIS — E669 Obesity, unspecified: Secondary | ICD-10-CM | POA: Diagnosis not present

## 2020-07-20 DIAGNOSIS — C678 Malignant neoplasm of overlapping sites of bladder: Secondary | ICD-10-CM | POA: Diagnosis not present

## 2020-07-20 DIAGNOSIS — E118 Type 2 diabetes mellitus with unspecified complications: Secondary | ICD-10-CM | POA: Diagnosis not present

## 2020-07-20 DIAGNOSIS — Z6835 Body mass index (BMI) 35.0-35.9, adult: Secondary | ICD-10-CM | POA: Diagnosis not present

## 2020-07-20 NOTE — Telephone Encounter (Signed)
pt has cancelled the remainder of his appts and wants to be discharged pt has cancelled the remainder of his appts and wants to be discharged

## 2020-07-21 ENCOUNTER — Encounter (HOSPITAL_COMMUNITY): Payer: Medicare HMO | Admitting: Physical Therapy

## 2020-07-22 ENCOUNTER — Encounter (HOSPITAL_COMMUNITY): Payer: Self-pay | Admitting: Physical Therapy

## 2020-07-22 NOTE — Therapy (Signed)
[place], [place], [address] Phone: [phone]   Fax:  [phone]  Patient Details  Name: Timothy [name] MRN: [id number] Date of Birth: 1946-10-07 Referring Provider:  No ref. provider found  Encounter Date: 07/22/2020   PHYSICAL THERAPY DISCHARGE SUMMARY  Visits from Start of Care: 6  Current functional level related to goals / functional outcomes: Unknown because patient did not return for re-assessment.    Remaining deficits: Unknown because patient did not return for re-assessment.   Education / Equipment: HEP and benefits of physical therapy Plan: Patient agrees to discharge.  Patient goals were not met. Patient is being discharged due to the patient's request.  ?????      Clarene Critchley PT, DPT 1:25 PM, 07/22/20 [place], [place], [address] Phone: [phone]   Fax:  [phone]

## 2020-07-23 ENCOUNTER — Encounter (HOSPITAL_COMMUNITY): Payer: Medicare HMO | Admitting: Physical Therapy

## 2020-07-28 ENCOUNTER — Encounter (HOSPITAL_COMMUNITY): Payer: Medicare HMO | Admitting: Physical Therapy

## 2020-07-30 ENCOUNTER — Encounter (HOSPITAL_COMMUNITY): Payer: Medicare HMO | Admitting: Physical Therapy

## 2020-08-06 DIAGNOSIS — H903 Sensorineural hearing loss, bilateral: Secondary | ICD-10-CM | POA: Diagnosis not present

## 2020-08-13 DIAGNOSIS — I10 Essential (primary) hypertension: Secondary | ICD-10-CM | POA: Diagnosis not present

## 2020-08-13 DIAGNOSIS — Z7984 Long term (current) use of oral hypoglycemic drugs: Secondary | ICD-10-CM | POA: Diagnosis not present

## 2020-08-13 DIAGNOSIS — E1151 Type 2 diabetes mellitus with diabetic peripheral angiopathy without gangrene: Secondary | ICD-10-CM | POA: Diagnosis not present

## 2020-08-13 DIAGNOSIS — E669 Obesity, unspecified: Secondary | ICD-10-CM | POA: Diagnosis not present

## 2020-08-13 DIAGNOSIS — Z7982 Long term (current) use of aspirin: Secondary | ICD-10-CM | POA: Diagnosis not present

## 2020-08-13 DIAGNOSIS — E1165 Type 2 diabetes mellitus with hyperglycemia: Secondary | ICD-10-CM | POA: Diagnosis not present

## 2020-08-13 DIAGNOSIS — E1142 Type 2 diabetes mellitus with diabetic polyneuropathy: Secondary | ICD-10-CM | POA: Diagnosis not present

## 2020-08-13 DIAGNOSIS — Z6833 Body mass index (BMI) 33.0-33.9, adult: Secondary | ICD-10-CM | POA: Diagnosis not present

## 2020-08-13 DIAGNOSIS — Z8546 Personal history of malignant neoplasm of prostate: Secondary | ICD-10-CM | POA: Diagnosis not present

## 2020-08-13 DIAGNOSIS — Z803 Family history of malignant neoplasm of breast: Secondary | ICD-10-CM | POA: Diagnosis not present

## 2020-08-28 DIAGNOSIS — E119 Type 2 diabetes mellitus without complications: Secondary | ICD-10-CM | POA: Diagnosis not present

## 2020-09-01 ENCOUNTER — Other Ambulatory Visit: Payer: Medicare HMO | Admitting: Urology

## 2020-09-03 DIAGNOSIS — E1129 Type 2 diabetes mellitus with other diabetic kidney complication: Secondary | ICD-10-CM | POA: Diagnosis not present

## 2020-09-03 DIAGNOSIS — I2510 Atherosclerotic heart disease of native coronary artery without angina pectoris: Secondary | ICD-10-CM | POA: Diagnosis not present

## 2020-09-03 DIAGNOSIS — I10 Essential (primary) hypertension: Secondary | ICD-10-CM | POA: Diagnosis not present

## 2020-09-14 DIAGNOSIS — M898X7 Other specified disorders of bone, ankle and foot: Secondary | ICD-10-CM | POA: Diagnosis not present

## 2020-09-14 DIAGNOSIS — M79671 Pain in right foot: Secondary | ICD-10-CM | POA: Diagnosis not present

## 2020-09-14 DIAGNOSIS — M722 Plantar fascial fibromatosis: Secondary | ICD-10-CM | POA: Diagnosis not present

## 2020-09-28 ENCOUNTER — Ambulatory Visit: Payer: Medicare HMO | Admitting: Physician Assistant

## 2020-09-28 ENCOUNTER — Ambulatory Visit: Payer: Medicare HMO | Admitting: Cardiology

## 2020-10-03 DIAGNOSIS — I10 Essential (primary) hypertension: Secondary | ICD-10-CM | POA: Diagnosis not present

## 2020-10-03 DIAGNOSIS — E1129 Type 2 diabetes mellitus with other diabetic kidney complication: Secondary | ICD-10-CM | POA: Diagnosis not present

## 2020-10-06 ENCOUNTER — Other Ambulatory Visit: Payer: Self-pay

## 2020-10-06 ENCOUNTER — Ambulatory Visit (INDEPENDENT_AMBULATORY_CARE_PROVIDER_SITE_OTHER): Payer: Medicare HMO | Admitting: Urology

## 2020-10-06 ENCOUNTER — Encounter: Payer: Self-pay | Admitting: Urology

## 2020-10-06 VITALS — BP 185/98 | HR 71 | Temp 97.6°F | Ht 68.0 in | Wt 220.0 lb

## 2020-10-06 DIAGNOSIS — C61 Malignant neoplasm of prostate: Secondary | ICD-10-CM | POA: Diagnosis not present

## 2020-10-06 DIAGNOSIS — C679 Malignant neoplasm of bladder, unspecified: Secondary | ICD-10-CM | POA: Diagnosis not present

## 2020-10-06 DIAGNOSIS — N401 Enlarged prostate with lower urinary tract symptoms: Secondary | ICD-10-CM | POA: Diagnosis not present

## 2020-10-06 DIAGNOSIS — R3916 Straining to void: Secondary | ICD-10-CM | POA: Diagnosis not present

## 2020-10-06 LAB — MICROSCOPIC EXAMINATION
Bacteria, UA: NONE SEEN
Epithelial Cells (non renal): NONE SEEN /hpf (ref 0–10)
Renal Epithel, UA: NONE SEEN /hpf
WBC, UA: NONE SEEN /hpf (ref 0–5)

## 2020-10-06 LAB — URINALYSIS, ROUTINE W REFLEX MICROSCOPIC
Bilirubin, UA: NEGATIVE
Glucose, UA: NEGATIVE
Ketones, UA: NEGATIVE
Leukocytes,UA: NEGATIVE
Nitrite, UA: NEGATIVE
Specific Gravity, UA: 1.02 (ref 1.005–1.030)
Urobilinogen, Ur: 0.2 mg/dL (ref 0.2–1.0)
pH, UA: 7 (ref 5.0–7.5)

## 2020-10-06 MED ORDER — CIPROFLOXACIN HCL 500 MG PO TABS
500.0000 mg | ORAL_TABLET | Freq: Once | ORAL | Status: AC
  Administered 2020-10-06: 500 mg via ORAL

## 2020-10-06 MED ORDER — TAMSULOSIN HCL 0.4 MG PO CAPS: 0.4000 mg | ORAL_CAPSULE | Freq: Every day | ORAL | 3 refills | Status: DC

## 2020-10-06 NOTE — Progress Notes (Signed)
H&P  Chief Complaint: Cysto  History of Present Illness:   11.21.2021: Pt here for Cysto. No blood seen in urine but worse LUTS  BPH:  Pt notes that his FOS is recently diminished.  (below copied from [facility] records):  BPH:   Timothy [name] is a 74 year-old male established patient who is here for follow up regarding further evaluation of BPH and lower urinary tract symptoms.  Patient is currently treated with Alfuzosin for his symptoms. His symptoms have been stable over the last year.   He is on an alpha-blocker for BPH.   8.18.2020: He denies any changes in urinary pattern since last visit.   Bladder Cancer:  Initial TURBT on 4.10.2014. He had papillary, low-grade, NMIBC. He eventually was found to have recurrence within 6 months, and underwent repeat TURBT on 10.27.2014. Again, this was low-grade, NMIBC. Because of his recurrence in a short period of time, despite using mitomycin after his first and second treatments, he underwent BCG induction, which was completed in January, 2014. He did have a significant inflammatory response to the mitomycin.   In April, 2015 at routine follow-up, he was found to have 2 recurrences, one approximately 1.5 and 1 approximately 1.0 cm. He underwent TURBT on 4.27.2015. This revealed high-grade NMIBC. He completed a second BCG induction course in early August, 2015. He subsequently underwent cystoscopy, bilateral retrogrades, and bladder biopsy on 5.3.2016. Biopsies of the bladder were negative. Retrograde studies were normal. He has had significant dysuria during his treatments. He has completed 3 maintenance BCG treatments, the last of which was 11.29.2016.   Because of the patient's significant side effects from his BCG i.e. dysuria, bladder pain, we stopped maintenance therapy after his 11.29.2016 dose.   8.26.2019: Because of positive cytologies, he underwent cystoscopy, bilateral retrogrades/renal washings, bladder barbotage and bladder  biopsy. Bladder washings were negative, barbotage specimen negative, biopsies revealed only benign/inflammatory change.   10.8.2019: No gross hematuria. Dysuria from procedure has essentially resolved.   2.11.2020: Cysto--stable scars. Cytology negative.   8.18.2020: Cysto today. No recent gross hematuria or urinary complaints.   Prostate Cancer:  He is participating in active surveillance.   Because of a rising PSA, he underwent a concurrent transrectal ultrasound and biopsy of his prostate on 4.27.2015. Prostatic volume was 53.55 cm. PSA was 9.13. PSA density 0.17. 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 That biopsy was left base lateral, and less than 5% of the core was involved. There was no granulomatous reaction noted. He underwent repeat/surveillance biopsy on 1.8.2015. Prostatic volume was measured at 53.5 mL. All 12 cores returned negative for adenocarcinoma, with 1 core in the right apex revealing chronic granulomatous inflammation.   2.11.2020: PSA 8.7   8.18.2020: Will check PSA today.   Past Medical History:  Diagnosis Date  . Arthritis   . Bladder cancer ([place])   . CAD (coronary artery disease)    a. 06/2014: NSTEMI s/p PTCA of LCx, 60-80% residual in distal LAD, 70% prox small D1.   b. 02/06/15 NSTEMI  s/p successful PCI/DES to mid RCA  . Diabetes mellitus, type 2 ([place])   . Elevated PSA   . HLD (hyperlipidemia)   . HOH (hard of hearing)   . Hypertension   . NSTEMI (non-ST elevated myocardial infarction) [facility])     Past Surgical History:  Procedure Laterality Date  . ABDOMINAL AORTOGRAM W/LOWER EXTREMITY N/A 08/28/2018   Procedure: ABDOMINAL AORTOGRAM W/LOWER EXTREMITY;  Surgeon: Serafina Mitchell, MD;  Location: [place] CV LAB;  Service: Cardiovascular;  Laterality: N/A;  bilateral  . ABDOMINAL AORTOGRAM W/LOWER EXTREMITY N/A 07/09/2019   Procedure: ABDOMINAL AORTOGRAM W/LOWER EXTREMITY;  Surgeon: Serafina Mitchell, MD;  Location: [place] CV LAB;  Service:  Cardiovascular;  Laterality: N/A;  unilateral Lt  . CARDIAC CATHETERIZATION  02/06/2015   Procedure: CORONARY STENT INTERVENTION;  Surgeon: [name], MD;  Location: [facility] CATH LAB;  Service: Cardiovascular;;  Mid RCA  . CARDIAC CATHETERIZATION N/A 11/11/2016   Procedure: Left Heart Cath and Coronary Angiography;  Surgeon: [name], MD;  Location: [place] CV LAB;  Service: Cardiovascular;  Laterality: N/A;  . CARDIAC CATHETERIZATION N/A 11/11/2016   Procedure: Coronary Balloon Angioplasty;  Surgeon: [name], MD;  Location: [place] CV LAB;  Service: Cardiovascular;  Laterality: N/A;  . COLONOSCOPY N/A 01/14/2019   Procedure: COLONOSCOPY;  Surgeon: Rogene Houston, MD;  [facility];  Service: Endoscopy;  Laterality: N/A;  730  . CORONARY ANGIOPLASTY  11/11/2016   Mid Cx to Dist Cx lesion, 100 %stenosed. This lesion was treated with balloon angioplasty with a 2.0 balloon  . CYSTOSCOPY W/ RETROGRADES N/A 05/05/2015   Procedure: CYSTOSCOPY WITH BILATERAL RETROGRADE PYELOGRAMS AND BLADDER BIOPSIES;  Surgeon: [name], MD;  [facility];  Service: Urology;  Laterality: N/A;  . CYSTOSCOPY W/ RETROGRADES Bilateral 07/31/2018   Procedure: CYSTOSCOPY WITH BILATERAL RETROGRADE PYELOGRAM;  Surgeon: [name], MD;  [facility];  Service: Urology;  Laterality: Bilateral;  . CYSTOSCOPY WITH BIOPSY N/A 07/31/2018   Procedure: CYSTOSCOPY WITH BIOPSY;  Surgeon: [name], MD;  [facility];  Service: Urology;  Laterality: N/A;  . LEFT HEART CATHETERIZATION WITH CORONARY ANGIOGRAM N/A 06/17/2014   Procedure: LEFT HEART CATHETERIZATION WITH CORONARY ANGIOGRAM;  Surgeon: [name], MD;  Location: [facility] CATH LAB;  Service: Cardiovascular;  Laterality: N/A;  . LEFT HEART CATHETERIZATION WITH CORONARY ANGIOGRAM N/A 02/06/2015   Procedure: LEFT HEART CATHETERIZATION WITH CORONARY ANGIOGRAM;  Surgeon: [name], MD;  Location: [facility] CATH LAB;   Service: Cardiovascular;  Laterality: N/A;  . PERIPHERAL VASCULAR ATHERECTOMY  08/28/2018   Procedure: PERIPHERAL VASCULAR ATHERECTOMY;  Surgeon: Serafina Mitchell, MD;  Location: [place] CV LAB;  Service: Cardiovascular;;  Prox lt.SFA, Distal SFA  . PERIPHERAL VASCULAR INTERVENTION  07/09/2019   Procedure: PERIPHERAL VASCULAR INTERVENTION;  Surgeon: Serafina Mitchell, MD;  Location: [place] CV LAB;  Service: Cardiovascular;;  Lt. SFA  . POLYPECTOMY  01/14/2019   Procedure: POLYPECTOMY;  Surgeon: Rogene Houston, MD;  [facility];  Service: Endoscopy;;  colon  . PROSTATE BIOPSY N/A 03/31/2014   Procedure: BIOPSY TRANSRECTAL ULTRASONIC PROSTATE (TUBP);  Surgeon: [name], MD;  Location: [facility];  Service: Urology;  Laterality: N/A;  . TRANSURETHRAL RESECTION OF BLADDER TUMOR N/A 03/14/2013   Procedure: TRANSURETHRAL RESECTION OF BLADDER TUMOR (TURBT);  Surgeon: [name], MD;  Location: [facility];  Service: Urology;  Laterality: N/A;  1 HR WITH MITOMYCIN INSTILLATION   . TRANSURETHRAL RESECTION OF BLADDER TUMOR N/A 09/30/2013   Procedure: TRANSURETHRAL RESECTION OF BLADDER TUMOR (TURBT);  Surgeon: [name], MD;  Location: [facility];  Service: Urology;  Laterality: N/A;  . TRANSURETHRAL RESECTION OF BLADDER TUMOR N/A 03/31/2014   Procedure: TRANSURETHRAL RESECTION OF BLADDER TUMOR (TURBT);  Surgeon: [name], MD;  Location: [facility];  Service: Urology;  Laterality: N/A;  . TRANSURETHRAL RESECTION OF BLADDER TUMOR N/A 05/31/2015   Procedure: Cystoscopy  with clott evacuation and fulgeration of bleeders, retrograde pylegram;  Surgeon: [name], MD;  [facility];  Service: Urology;  Laterality: N/A;    Home Medications:  Allergies as of 10/06/2020      Reactions   Lipitor [atorvastatin] Other (See Comments)   Myalgia   Other Other (See Comments)   All Cholesterol Meds -  causes muscle and joint pain   Pravastatin Other (See Comments)   Myalgia      Medication List       Accurate as of October 06, 2020 12:40 PM. If you have any questions, ask your nurse or doctor.        acetaminophen 500 MG tablet Commonly known as: TYLENOL Take 500 mg by mouth every 6 (six) hours as needed (pain).   aspirin 81 MG chewable tablet Chew 1 tablet (81 mg total) by mouth daily. What changed: when to take this   glimepiride 2 MG tablet Commonly known as: AMARYL   hydrALAZINE 50 MG tablet Commonly known as: APRESOLINE TAKE 1.5 TABLETS (75 MG TOTAL) BY MOUTH 3 (THREE) TIMES DAILY.   metoprolol succinate 25 MG 24 hr tablet Commonly known as: TOPROL-XL TAKE 3 TABLES TOTAL DAILY   sitaGLIPtin 100 MG tablet Commonly known as: JANUVIA Take 100 mg by mouth every morning. (1100)       Allergies:  Allergies  Allergen Reactions  . Lipitor [Atorvastatin] Other (See Comments)    Myalgia  . Other Other (See Comments)    All Cholesterol Meds - causes muscle and joint pain  . Pravastatin Other (See Comments)    Myalgia    Family History  Problem Relation Age of Onset  . Cancer Child   . Asthma Mother     Social History:  reports that he quit smoking about 27 years ago. His smoking use included cigarettes. He has a 30.00 pack-year smoking history. He has never used smokeless tobacco. He reports that he does not drink alcohol and does not use drugs.  ROS: A complete review of systems was performed.  All systems are negative except for pertinent findings as noted.  Physical Exam:  Vital signs in last 24 hours: There were no vitals taken for this visit. Constitutional:  Alert and oriented, No acute distress Cardiovascular: Regular rate  Respiratory: Normal respiratory effort GI: Abdomen is soft, nontender, nondistended, no abdominal masses. No CVAT.  Genitourinary: Normal male phallus, testes are descended bilaterally and non-tender and without masses,  scrotum is normal in appearance without lesions or masses, perineum is normal on inspection. Lymphatic: No lymphadenopathy Neurologic: Grossly intact, no focal deficits Psychiatric: Normal mood and affect  Cystoscopy Procedure Note:  Indication: Follow-up of bladder cancer  After informed consent and discussion of the procedure and its risks, Timothy [name] was positioned and prepped in the standard fashion.  Cystoscopy was performed with a flexible cystoscope.   Findings: Urethra: No stricture or urothelial lesions Prostate: Bilobar hypertrophy with significant obstruction.  No lesions seen. Bladder neck: Normal Ureteral orifices: Normal Bladder: Old resection sites evident, scarred without urothelial abnormality.  There are 2 areas in the anterior bladder wall, the largest is 2 cm, with slight urothelial abnormality/shaggy appearance.  The patient tolerated the procedure well.      I have reviewed prior pt notes  I have reviewed notes from referring/previous physicians  I have reviewed urinalysis results  I have independently reviewed prior imaging  I have reviewed prior PSA results  Impression/Assessment:  1. BPH - Pt FOS is  inadequate and requires pharmaceutical intervention at this time. He will be started on tamsulosin.  2.  Bladder cancer-worrisome lesions on cystoscopy  3.  Prostate cancer, low risk, on active surveillance  Plan:  1. Urine sent for cytology today.  2. Pt started on tamsulosin.  3. Unless cytology comes back completely normal, he will be followed up with an anesthetic cystoscopy.  4.  We will check PSA  CC: Dr. Redmond School

## 2020-10-06 NOTE — Progress Notes (Signed)
Urological Symptom Review  Patient is experiencing the following symptoms: Burning/pain with urination Get up at night to urinate Weak stream   Review of Systems  Gastrointestinal (upper)  : Negative for upper GI symptoms  Gastrointestinal (lower) : Negative for lower GI symptoms  Constitutional : Negative for symptoms  Skin: Negative for skin symptoms  Eyes: Negative for eye symptoms  Ear/Nose/Throat : Negative for Ear/Nose/Throat symptoms  Hematologic/Lymphatic: Negative for Hematologic/Lymphatic symptoms  Cardiovascular : Negative for cardiovascular symptoms  Respiratory : Negative for respiratory symptoms  Endocrine: Negative for endocrine symptoms  Musculoskeletal: Negative for musculoskeletal symptoms  Neurological: Negative for neurological symptoms  Psychologic: Negative for psychiatric symptoms

## 2020-10-06 NOTE — Progress Notes (Deleted)
Cardiology Office Note    Date:  10/06/2020   ID:  Timothy [name], DOB 12/16/1945, MRN [id number]  PCP:  Redmond School, MD  Cardiologist: Carlyle Dolly, MD EPS: None  No chief complaint on file.   History of Present Illness:  Timothy [name] is a 74 y.o. male with history of CAD status post NSTEMI 06/2014 treated with PTCA of the circumflex, LV EF 50 to 55% with grade 1 DD, NSTEMI 11/2016 PTCA of the mid to distal circumflex too small to be stented, echo 11/2016 LVEF 40 to 45%, ischemic cardiomyopathy, hypertension, HLD, PAD status post left SFA stent followed by vascular.  Normal ABIs 12/2019.  Patient last saw Dr. Harl Bowie 03/24/2020 which time hydralazine was increased to 75 mg 3 times daily.  Norvasc was stopped because of foot pain that resolved.  Lipid clinic for PCSK9 inhibitors as he is intolerant to statins  Past Medical History:  Diagnosis Date  . Arthritis   . Bladder cancer ([place])   . CAD (coronary artery disease)    a. 06/2014: NSTEMI s/p PTCA of LCx, 60-80% residual in distal LAD, 70% prox small D1.   b. 02/06/15 NSTEMI  s/p successful PCI/DES to mid RCA  . Diabetes mellitus, type 2 ([place])   . Elevated PSA   . HLD (hyperlipidemia)   . HOH (hard of hearing)   . Hypertension   . NSTEMI (non-ST elevated myocardial infarction) [facility])     Past Surgical History:  Procedure Laterality Date  . ABDOMINAL AORTOGRAM W/LOWER EXTREMITY N/A 08/28/2018   Procedure: ABDOMINAL AORTOGRAM W/LOWER EXTREMITY;  Surgeon: Serafina Mitchell, MD;  Location: [facility] [facility];  Service: Cardiovascular;  Laterality: N/A;  bilateral  . ABDOMINAL AORTOGRAM W/LOWER EXTREMITY N/A 07/09/2019   Procedure: ABDOMINAL AORTOGRAM W/LOWER EXTREMITY;  Surgeon: Serafina Mitchell, MD;  Location: [place] [facility];  Service: Cardiovascular;  Laterality: N/A;  unilateral Lt  . CARDIAC CATHETERIZATION  02/06/2015   Procedure: CORONARY STENT INTERVENTION;  Surgeon: [name], MD;  Location: [facility] CATH LAB;   Service: Cardiovascular;;  Mid RCA  . CARDIAC CATHETERIZATION N/A 11/11/2016   Procedure: Left Heart Cath and Coronary Angiography;  Surgeon: [name], MD;  Location: [place] [facility];  Service: Cardiovascular;  Laterality: N/A;  . CARDIAC CATHETERIZATION N/A 11/11/2016   Procedure: Coronary Balloon Angioplasty;  Surgeon: [name], MD;  Location: [place] [facility];  Service: Cardiovascular;  Laterality: N/A;  . COLONOSCOPY N/A 01/14/2019   Procedure: COLONOSCOPY;  Surgeon: Rogene Houston, MD;  [facility];  Service: Endoscopy;  Laterality: N/A;  730  . CORONARY ANGIOPLASTY  11/11/2016   Mid Cx to Dist Cx lesion, 100 %stenosed. This lesion was treated with balloon angioplasty with a 2.0 balloon  . CYSTOSCOPY W/ RETROGRADES N/A 05/05/2015   Procedure: CYSTOSCOPY WITH BILATERAL RETROGRADE PYELOGRAMS AND BLADDER BIOPSIES;  Surgeon: [name], MD;  Location: [facility];  Service: Urology;  Laterality: N/A;  . CYSTOSCOPY W/ RETROGRADES Bilateral 07/31/2018   Procedure: CYSTOSCOPY WITH BILATERAL RETROGRADE PYELOGRAM;  Surgeon: [name], MD;  Location: [facility];  Service: Urology;  Laterality: Bilateral;  . CYSTOSCOPY WITH BIOPSY N/A 07/31/2018   Procedure: CYSTOSCOPY WITH BIOPSY;  Surgeon: [name], MD;  Location: [facility];  Service: Urology;  Laterality: N/A;  . LEFT HEART CATHETERIZATION WITH CORONARY ANGIOGRAM N/A 06/17/2014   Procedure: LEFT HEART CATHETERIZATION WITH CORONARY ANGIOGRAM;  Surgeon: [name], MD;  Location: [facility] CATH LAB;  Service: Cardiovascular;  Laterality:  N/A;  . LEFT HEART CATHETERIZATION WITH CORONARY ANGIOGRAM N/A 02/06/2015   Procedure: LEFT HEART CATHETERIZATION WITH CORONARY ANGIOGRAM;  Surgeon: [name], MD;  Location: [facility] CATH LAB;  Service: Cardiovascular;  Laterality: N/A;  . PERIPHERAL VASCULAR ATHERECTOMY  08/28/2018   Procedure: PERIPHERAL VASCULAR ATHERECTOMY;  Surgeon: Serafina Mitchell, MD;  Location: [place] [facility];  Service: Cardiovascular;;  Prox lt.SFA, Distal SFA  . PERIPHERAL VASCULAR INTERVENTION  07/09/2019   Procedure: PERIPHERAL VASCULAR INTERVENTION;  Surgeon: Serafina Mitchell, MD;  Location: [place] [facility];  Service: Cardiovascular;;  Lt. SFA  . POLYPECTOMY  01/14/2019   Procedure: POLYPECTOMY;  Surgeon: Rogene Houston, MD;  [facility];  Service: Endoscopy;;  colon  . PROSTATE BIOPSY N/A 03/31/2014   Procedure: BIOPSY TRANSRECTAL ULTRASONIC PROSTATE (TUBP);  Surgeon: [name], MD;  Location: [facility];  Service: Urology;  Laterality: N/A;  . TRANSURETHRAL RESECTION OF BLADDER TUMOR N/A 03/14/2013   Procedure: TRANSURETHRAL RESECTION OF BLADDER TUMOR (TURBT);  Surgeon: [name], MD;  Location: [facility];  Service: Urology;  Laterality: N/A;  1 HR WITH MITOMYCIN INSTILLATION   . TRANSURETHRAL RESECTION OF BLADDER TUMOR N/A 09/30/2013   Procedure: TRANSURETHRAL RESECTION OF BLADDER TUMOR (TURBT);  Surgeon: [name], MD;  Location: [facility];  Service: Urology;  Laterality: N/A;  . TRANSURETHRAL RESECTION OF BLADDER TUMOR N/A 03/31/2014   Procedure: TRANSURETHRAL RESECTION OF BLADDER TUMOR (TURBT);  Surgeon: [name], MD;  Location: [facility];  Service: Urology;  Laterality: N/A;  . TRANSURETHRAL RESECTION OF BLADDER TUMOR N/A 05/31/2015   Procedure: Cystoscopy with clott evacuation and fulgeration of bleeders, retrograde pylegram;  Surgeon: [name], MD;  Location: [facility];  Service: Urology;  Laterality: N/A;    Current Medications: No outpatient medications have been marked as taking for the 10/12/20 encounter (Appointment) with Imogene Burn, PA-C.   Current Facility-Administered Medications for the 10/12/20 encounter (Appointment) with Imogene Burn, PA-C  Medication  . ciprofloxacin (CIPRO) tablet 500 mg     Allergies:   Lipitor [atorvastatin],  Other, and Pravastatin   Social History   Socioeconomic History  . Marital status: Married    Spouse name: Not on file  . Number of children: Not on file  . Years of education: Not on file  . Highest education level: Not on file  Occupational History  . Not on file  Tobacco Use  . Smoking status: Former Smoker    Packs/day: 1.00    Years: 30.00    Pack years: 30.00    Types: Cigarettes    Quit date: 03/11/1993    Years since quitting: 27.5  . Smokeless tobacco: Never Used  Vaping Use  . Vaping Use: Never used  Substance and Sexual Activity  . Alcohol use: No    Alcohol/week: 0.0 standard drinks  . Drug use: No  . Sexual activity: Not Currently  Other Topics Concern  . Not on file  Social History Narrative  . Not on file   Social Determinants of Health   Financial Resource Strain:   . Difficulty of Paying Living Expenses: Not on file  Food Insecurity:   . Worried About Charity fundraiser in the Last Year: Not on file  . Ran Out of Food in the Last Year: Not on file  Transportation Needs:   . Lack of Transportation (Medical): Not on file  . Lack of Transportation (Non-Medical): Not on file  Physical Activity:   . Days of Exercise per Week: Not on file  . Minutes of Exercise per Session: Not on file  Stress:   . Feeling of Stress : Not on file  Social Connections:   . Frequency of Communication with Friends and Family: Not on file  . Frequency of Social Gatherings with Friends and Family: Not on file  . Attends Religious Services: Not on file  . Active Member of Clubs or Organizations: Not on file  . Attends Archivist Meetings: Not on file  . Marital Status: Not on file     Family History:  The patient's ***family history includes Asthma in his mother; Cancer in his child.   ROS:   Please see the history of present illness.    ROS All other systems reviewed and are negative.   PHYSICAL EXAM:   VS:  There were no vitals taken for this visit.   Physical Exam  GEN: Well nourished, well developed, in no acute distress  HEENT: normal  Neck: no JVD, carotid bruits, or masses Cardiac:RRR; no murmurs, rubs, or gallops  Respiratory:  clear to auscultation bilaterally, normal work of breathing GI: soft, nontender, nondistended, + BS Ext: without cyanosis, clubbing, or edema, Good distal pulses bilaterally MS: no deformity or atrophy  Skin: warm and dry, no rash Neuro:  Alert and Oriented x 3, Strength and sensation are intact Psych: euthymic mood, full affect  Wt Readings from Last 3 Encounters:  10/06/20 220 lb (99.8 kg)  04/27/20 220 lb (99.8 kg)  03/24/20 223 lb (101.2 kg)      Studies/Labs Reviewed:   EKG:  EKG is*** ordered today.  The ekg ordered today demonstrates ***  Recent Labs: 02/25/2020: BUN 16; Creat 1.43; Potassium 4.8; Sodium 140   Lipid Panel    Component Value Date/Time   CHOL 138 11/12/2016 0101   TRIG 197 (H) 11/12/2016 0101   HDL 25 (L) 11/12/2016 0101   CHOLHDL 5.5 11/12/2016 0101   VLDL 39 11/12/2016 0101   LDLCALC 74 11/12/2016 0101    Additional studies/ records that were reviewed today include:  2D echo 02/21/2018 Study Conclusions   - Left ventricle: Mid and basal inferior wall hypokinesis. The    cavity size was mildly dilated. Wall thickness was normal.    Systolic function was mildly to moderately reduced. The estimated    ejection fraction was in the range of 40% to 45%.  - Aortic valve: There was trivial regurgitation. Valve area (VTI):    1.51 cm^2. Valve area (Vmax): 1.75 cm^2. Valve area (Vmean): 1.64    cm^2.  - Atrial septum: No defect or patent foramen ovale was identified.  - Pulmonary arteries: PA peak pressure: 37 mm Hg (S).   -------------------------------------------------------------------  Study data:  Comparison was made to the study of 11/12/2016.  Study  status:  Routine.  Procedure:  The patient reported no pain pre or  post test. Transthoracic  echocardiography. Image quality was  adequate.  Study completion:  There were no complications.  Transthoracic echocardiography.  M-mode, complete 2D, spectral  Doppler, and color Doppler.  Birthdate:  Patient birthdate:  07-10-1946.  Age:  Patient is 74 yr old.  Sex:  Gender: male.  BMI: 32.1 kg/m^2.  Blood pressure:     180/90  Patient status:  Inpatient.  Study date:  Study date: 02/21/2018. Study time: 09:39  AM.  Location:  Echo laboratory.   -------------------------------------------------------------------   -------------------------------------------------------------------  Left ventricle:  Mid and  basal inferior wall hypokinesis. The  cavity size was mildly dilated. Wall thickness was normal. Systolic  function was mildly to moderately reduced. The estimated ejection  fraction was in the range of 40% to 45%.      ASSESSMENT:    1. Coronary artery disease involving native coronary artery of native heart without angina pectoris   2. Ischemic cardiomyopathy   3. Essential hypertension   4. Hyperlipidemia, unspecified hyperlipidemia type   5. PAD (peripheral artery disease) (HCC)      PLAN:  In order of problems listed above:  CAD status post NSTEMI 06/2014 PTCA of the circumflex LVEF 50 to 55% with grade 1 DD, NSTEMI 11/2016 PTCA of the mid to distal circumflex too small to be stented, echo 11/2016 LVEF 40 to 45%  Ischemic cardiomyopathy  Hypertension hydralazine increased to 75 mg 3 times daily in April because of elevated blood pressures  Hyperlipidemia intolerant to statins and referred to lipid clinic but I believe it has been done  PAD followed by vascular left SFA stent normal ABIs 12/2019  Medication Adjustments/Labs and Tests Ordered: Current medicines are reviewed at length with the patient today.  Concerns regarding medicines are outlined above.  Medication changes, Labs and Tests ordered today are listed in the Patient Instructions below. There are no  Patient Instructions on file for this visit.   Sumner Boast, PA-C  10/06/2020 3:18 PM    [facility] [place], [place], [place]  [address] Phone: [phone]; Fax: [phone]

## 2020-10-07 LAB — PSA: Prostate Specific Ag, Serum: 8 ng/mL — ABNORMAL HIGH (ref 0.0–4.0)

## 2020-10-08 LAB — CYTOLOGY, URINE

## 2020-10-08 NOTE — Progress Notes (Signed)
Sent via my chart and letter mailed

## 2020-10-12 ENCOUNTER — Ambulatory Visit: Payer: Medicare HMO | Admitting: Physician Assistant

## 2020-10-12 DIAGNOSIS — E785 Hyperlipidemia, unspecified: Secondary | ICD-10-CM

## 2020-10-12 DIAGNOSIS — I2510 Atherosclerotic heart disease of native coronary artery without angina pectoris: Secondary | ICD-10-CM

## 2020-10-12 DIAGNOSIS — I10 Essential (primary) hypertension: Secondary | ICD-10-CM

## 2020-10-12 DIAGNOSIS — I255 Ischemic cardiomyopathy: Secondary | ICD-10-CM

## 2020-10-12 DIAGNOSIS — I739 Peripheral vascular disease, unspecified: Secondary | ICD-10-CM

## 2020-10-25 ENCOUNTER — Other Ambulatory Visit: Payer: Self-pay | Admitting: Cardiology

## 2020-11-10 DIAGNOSIS — I10 Essential (primary) hypertension: Secondary | ICD-10-CM | POA: Diagnosis not present

## 2020-11-10 DIAGNOSIS — E669 Obesity, unspecified: Secondary | ICD-10-CM | POA: Diagnosis not present

## 2020-11-10 DIAGNOSIS — Z23 Encounter for immunization: Secondary | ICD-10-CM | POA: Diagnosis not present

## 2020-11-10 DIAGNOSIS — E1140 Type 2 diabetes mellitus with diabetic neuropathy, unspecified: Secondary | ICD-10-CM | POA: Diagnosis not present

## 2020-11-10 DIAGNOSIS — Z6834 Body mass index (BMI) 34.0-34.9, adult: Secondary | ICD-10-CM | POA: Diagnosis not present

## 2020-11-17 ENCOUNTER — Other Ambulatory Visit: Payer: Self-pay | Admitting: Urology

## 2020-11-17 DIAGNOSIS — C679 Malignant neoplasm of bladder, unspecified: Secondary | ICD-10-CM

## 2020-11-24 DIAGNOSIS — G720 Drug-induced myopathy: Secondary | ICD-10-CM | POA: Insufficient documentation

## 2020-11-24 DIAGNOSIS — T466X5A Adverse effect of antihyperlipidemic and antiarteriosclerotic drugs, initial encounter: Secondary | ICD-10-CM | POA: Insufficient documentation

## 2020-12-04 DIAGNOSIS — E1129 Type 2 diabetes mellitus with other diabetic kidney complication: Secondary | ICD-10-CM | POA: Diagnosis not present

## 2020-12-04 DIAGNOSIS — I10 Essential (primary) hypertension: Secondary | ICD-10-CM | POA: Diagnosis not present

## 2020-12-11 ENCOUNTER — Telehealth: Payer: Self-pay

## 2020-12-11 NOTE — Telephone Encounter (Signed)
-----   Message from [name], MD sent at 12/10/2020 11:49 AM EST ----- I left VM on pts phone regarding cytology--I recommended anesthetic cysto/Bx. He will call back if he wants to schedule. ----- Message ----- From: Iris Pert, LPN Sent: [id number]/02/8936  10:42 AM EST To: [name], MD  Please review

## 2020-12-30 ENCOUNTER — Other Ambulatory Visit: Payer: Self-pay | Admitting: Urology

## 2020-12-30 ENCOUNTER — Other Ambulatory Visit (HOSPITAL_COMMUNITY): Payer: Self-pay | Admitting: Urology

## 2020-12-30 DIAGNOSIS — C61 Malignant neoplasm of prostate: Secondary | ICD-10-CM

## 2020-12-30 DIAGNOSIS — Z8546 Personal history of malignant neoplasm of prostate: Secondary | ICD-10-CM

## 2020-12-30 DIAGNOSIS — C679 Malignant neoplasm of bladder, unspecified: Secondary | ICD-10-CM

## 2021-01-02 DIAGNOSIS — E1129 Type 2 diabetes mellitus with other diabetic kidney complication: Secondary | ICD-10-CM | POA: Diagnosis not present

## 2021-01-02 DIAGNOSIS — I10 Essential (primary) hypertension: Secondary | ICD-10-CM | POA: Diagnosis not present

## 2021-01-04 ENCOUNTER — Other Ambulatory Visit (HOSPITAL_COMMUNITY): Payer: Medicare HMO

## 2021-01-04 ENCOUNTER — Encounter (HOSPITAL_COMMUNITY)
Admission: RE | Admit: 2021-01-04 | Discharge: 2021-01-04 | Disposition: A | Discharge status: Home / Self Care | Payer: Medicare HMO | Source: Ambulatory Visit | Attending: Urology | Admitting: Urology

## 2021-01-04 ENCOUNTER — Other Ambulatory Visit (HOSPITAL_COMMUNITY)
Admission: RE | Admit: 2021-01-04 | Discharge: 2021-01-04 | Disposition: A | Discharge status: Home / Self Care | Payer: Medicare HMO | Source: Ambulatory Visit | Attending: Urology | Admitting: Urology

## 2021-01-04 ENCOUNTER — Other Ambulatory Visit: Payer: Self-pay

## 2021-01-04 DIAGNOSIS — Z20822 Contact with and (suspected) exposure to covid-19: Secondary | ICD-10-CM | POA: Diagnosis not present

## 2021-01-04 DIAGNOSIS — Z8546 Personal history of malignant neoplasm of prostate: Secondary | ICD-10-CM

## 2021-01-04 DIAGNOSIS — Z01818 Encounter for other preprocedural examination: Secondary | ICD-10-CM | POA: Insufficient documentation

## 2021-01-04 LAB — CBC
HCT: 45.2 % (ref 39.0–52.0)
Hemoglobin: 15.3 g/dL (ref 13.0–17.0)
MCH: 32.4 pg (ref 26.0–34.0)
MCHC: 33.8 g/dL (ref 30.0–36.0)
MCV: 95.8 fL (ref 80.0–100.0)
Platelets: 187 10*3/uL (ref 150–400)
RBC: 4.72 MIL/uL (ref 4.22–5.81)
RDW: 14.3 % (ref 11.5–15.5)
WBC: 6.3 10*3/uL (ref 4.0–10.5)
nRBC: 0 % (ref 0.0–0.2)

## 2021-01-04 LAB — BASIC METABOLIC PANEL
Anion gap: 9 (ref 5–15)
BUN: 24 mg/dL — ABNORMAL HIGH (ref 8–23)
CO2: 25 mmol/L (ref 22–32)
Calcium: 9.2 mg/dL (ref 8.9–10.3)
Chloride: 106 mmol/L (ref 98–111)
Creatinine, Ser: 1.62 mg/dL — ABNORMAL HIGH (ref 0.61–1.24)
GFR, Estimated: 45 mL/min — ABNORMAL LOW (ref >60)
Glucose, Bld: 244 mg/dL — ABNORMAL HIGH (ref 70–99)
Potassium: 4.4 mmol/L (ref 3.5–5.1)
Sodium: 140 mmol/L (ref 135–145)

## 2021-01-04 LAB — SARS CORONAVIRUS 2 (TAT 6-24 HRS): SARS Coronavirus 2: NEGATIVE

## 2021-01-05 ENCOUNTER — Encounter (HOSPITAL_BASED_OUTPATIENT_CLINIC_OR_DEPARTMENT_OTHER): Payer: Self-pay | Admitting: Urology

## 2021-01-05 ENCOUNTER — Other Ambulatory Visit: Payer: Self-pay

## 2021-01-05 NOTE — Progress Notes (Addendum)
Spoke w/ via phone for pre-op interview---pt Lab needs dos----none               Lab results------cbc, bmet, ekg done 01-04-2021 epic COVID test ------01-04-2021 1052 Arrive at -------830 am 01-07-2021 NPO after MN NO Solid Food.  Water from MN until---730 am then npo Medications to take morning of surgery -----hydrazaline, metorpolol succinate Diabetic medication -----none day of surgery Patient Special Instructions -----fleets enema day of surgery Pre-Op special Istructions -----n/a Patient verbalized understanding of instructions that were given at this phone interview. Patient denies shortness of breath, chest pain, fever, cough at this phone interview.   Anesthesia Review: hx of mi 2015  , htn, cad, dm type 2 pad, lov dr branch says folllow up in 6 months dated 03-24-2020, gfr decreased with 01-04-2021 labs,  pt denies cardiac s & s at pre op call ok to proceed?  Chart to [place] zanetto pa for review  Addendum: spoke with jessica zanetto pa and patient needs cardiac clearance and left voice mail message with selita brasher patient needs cardiac clearance for 01-07-2021 surgery  PCP:dr lawrence fusco Cardiologist : dr branch Cassell Clement 03-24-2020 note says f/u in 6 months Chest x-ray :none EKG :01-04-2021 epic Echo :02-21-2018 epic Stress test: 01-02-2018 epic Cardiac Cath : last done 11-11-2016 epic Activity level: does housework and can climb staits without problems lov vascular matthew eveland pa 04-27-2020 epic peripheral vasc cath [id number] epic Sleep Study/ CPAP : none Fasting Blood Sugar : 150     / Checks Blood Sugar -1- time a day:   Blood Thinner/ Instructions /Last Dose:n/a ASA / Instructions/ Last Dose : instructed per dr Diona Fanti to stop 81 mg aspirin 5 days before surgery  lov dr [place] Nails fusco 11-10-2020 on chart  Reviewed pre op instructions with wife yvonne

## 2021-01-06 ENCOUNTER — Encounter (HOSPITAL_COMMUNITY): Payer: Self-pay | Admitting: Physician Assistant

## 2021-01-07 ENCOUNTER — Ambulatory Visit (HOSPITAL_COMMUNITY): Admission: RE | Admit: 2021-01-07 | Payer: Medicare HMO | Source: Ambulatory Visit

## 2021-01-07 ENCOUNTER — Ambulatory Visit (HOSPITAL_BASED_OUTPATIENT_CLINIC_OR_DEPARTMENT_OTHER): Admission: RE | Admit: 2021-01-07 | Payer: Medicare HMO | Source: Home / Self Care | Admitting: Urology

## 2021-01-07 HISTORY — DX: Presence of external hearing-aid: Z97.4

## 2021-01-07 HISTORY — DX: Abnormal results of kidney function studies: R94.4

## 2021-01-07 HISTORY — DX: Other specified health status: Z78.9

## 2021-01-07 HISTORY — DX: Peripheral vascular disease, unspecified: I73.9

## 2021-01-07 SURGERY — BIOPSY, PROSTATE, RECTAL APPROACH, WITH US GUIDANCE
Anesthesia: General

## 2021-01-10 NOTE — Progress Notes (Unsigned)
Cardiology Office Note  Date: 01/11/2021   ID: Timothy [name], DOB Apr 19, 1946, MRN [id number]  PCP:  Redmond School, MD  Cardiologist:  Carlyle Dolly, MD Electrophysiologist:  None   Chief Complaint: Preop clearance Prostate and bladder biopsy  History of Present Illness: Timothy [name] is a 75 y.o. male with a history of CAD, Bladder CA, DM2, HTN, HLD, PVD.  Last encounter with Dr. Harl Bowie 03/24/2020 for follow-up of hypertension and hyperlipidemia.  Norvasc had caused foot pain and was stopped which resolved the symptoms.  At a prior visit hydralazine was increased to 50 mg p.o. 3 times daily.  He is intolerant to statins and had been on Zetia only.  He reported muscle aches on Zetia and now off the medication.  Previous LDL February 2021 114.  He is here today for preop clearance for transrectal prostate ultrasound with biopsy and cystoscopy with bilateral retrogrades and bladder biopsies Dr. Diona Fanti.  He denies any recent acute illnesses or hospitalizations.  He denies any anginal or exertional symptoms, palpitations or arrhythmias, orthostatic symptoms, CVA or TIA-like symptoms, PND, orthopnea, bleeding issues, denies any claudication-like symptoms other than occasional muscle cramping when walking.  He sees Dr. Trula Slade for peripheral vascular disease.  Denies any DVT or PE-like symptoms, or lower extremity edema.  Past Medical History:  Diagnosis Date  . Arthritis   . Bladder cancer ([place])   . Blood clot in vein 2018   left  . CAD (coronary artery disease)    a. 06/2014: NSTEMI s/p PTCA of LCx, 60-80% residual in distal LAD, 70% prox small D1.   b. 02/06/15 NSTEMI  s/p successful PCI/DES to mid RCA  . Decreased GFR 45 % per 01-04-2021 labs  . Diabetes mellitus, type 2 ([place])   . Elevated PSA   . HLD (hyperlipidemia)   . HOH (hard of hearing)   . Hypertension   . NSTEMI (non-ST elevated myocardial infarction) ([place]) 2015  . Peripheral vascular disease ([place])   . Poor  historian   . Wears hearing aid in right ear     Past Surgical History:  Procedure Laterality Date  . ABDOMINAL AORTOGRAM W/LOWER EXTREMITY N/A 08/28/2018   Procedure: ABDOMINAL AORTOGRAM W/LOWER EXTREMITY;  Surgeon: Serafina Mitchell, MD;  Location: [place] [facility];  Service: Cardiovascular;  Laterality: N/A;  bilateral  . ABDOMINAL AORTOGRAM W/LOWER EXTREMITY N/A 07/09/2019   Procedure: ABDOMINAL AORTOGRAM W/LOWER EXTREMITY;  Surgeon: Serafina Mitchell, MD;  Location: [place] [facility];  Service: Cardiovascular;  Laterality: N/A;  unilateral Lt  . CARDIAC CATHETERIZATION  02/06/2015   Procedure: CORONARY STENT INTERVENTION;  Surgeon: [name], MD;  Location: [facility] CATH LAB;  Service: Cardiovascular;;  Mid RCA  . CARDIAC CATHETERIZATION N/A 11/11/2016   Procedure: Left Heart Cath and Coronary Angiography;  Surgeon: [name], MD;  Location: [place] [facility];  Service: Cardiovascular;  Laterality: N/A;  . CARDIAC CATHETERIZATION N/A 11/11/2016   Procedure: Coronary Balloon Angioplasty;  Surgeon: [name], MD;  Location: [place] [facility];  Service: Cardiovascular;  Laterality: N/A;  . COLONOSCOPY N/A 01/14/2019   Procedure: COLONOSCOPY;  Surgeon: Rogene Houston, MD;  [facility];  Service: Endoscopy;  Laterality: N/A;  730  . CORONARY ANGIOPLASTY  11/11/2016   Mid Cx to Dist Cx lesion, 100 %stenosed. This lesion was treated with balloon angioplasty with a 2.0 balloon  . CYSTOSCOPY W/ RETROGRADES N/A 05/05/2015   Procedure: CYSTOSCOPY WITH BILATERAL RETROGRADE PYELOGRAMS AND BLADDER BIOPSIES;  Surgeon: [name], MD;  Location: [facility];  Service: Urology;  Laterality: N/A;  . CYSTOSCOPY W/ RETROGRADES Bilateral 07/31/2018   Procedure: CYSTOSCOPY WITH BILATERAL RETROGRADE PYELOGRAM;  Surgeon: [name], MD;  Location: [facility];  Service: Urology;  Laterality: Bilateral;  . CYSTOSCOPY WITH BIOPSY N/A 07/31/2018   Procedure: CYSTOSCOPY WITH BIOPSY;   Surgeon: [name], MD;  Location: [facility];  Service: Urology;  Laterality: N/A;  . LEFT HEART CATHETERIZATION WITH CORONARY ANGIOGRAM N/A 06/17/2014   Procedure: LEFT HEART CATHETERIZATION WITH CORONARY ANGIOGRAM;  Surgeon: [name], MD;  Location: [facility] CATH LAB;  Service: Cardiovascular;  Laterality: N/A;  . LEFT HEART CATHETERIZATION WITH CORONARY ANGIOGRAM N/A 02/06/2015   Procedure: LEFT HEART CATHETERIZATION WITH CORONARY ANGIOGRAM;  Surgeon: [name], MD;  Location: [facility] CATH LAB;  Service: Cardiovascular;  Laterality: N/A;  . PERIPHERAL VASCULAR ATHERECTOMY  08/28/2018   Procedure: PERIPHERAL VASCULAR ATHERECTOMY;  Surgeon: Serafina Mitchell, MD;  Location: [place] [facility];  Service: Cardiovascular;;  Prox lt.SFA, Distal SFA  . PERIPHERAL VASCULAR INTERVENTION  07/09/2019   Procedure: PERIPHERAL VASCULAR INTERVENTION;  Surgeon: Serafina Mitchell, MD;  Location: [place] [facility];  Service: Cardiovascular;;  Lt. SFA  . POLYPECTOMY  01/14/2019   Procedure: POLYPECTOMY;  Surgeon: Rogene Houston, MD;  [facility];  Service: Endoscopy;;  colon  . PROSTATE BIOPSY N/A 03/31/2014   Procedure: BIOPSY TRANSRECTAL ULTRASONIC PROSTATE (TUBP);  Surgeon: [name], MD;  Location: [facility];  Service: Urology;  Laterality: N/A;  . TRANSURETHRAL RESECTION OF BLADDER TUMOR N/A 03/14/2013   Procedure: TRANSURETHRAL RESECTION OF BLADDER TUMOR (TURBT);  Surgeon: [name], MD;  Location[facility];  Service: Urology;  Laterality: N/A;  1 HR WITH MITOMYCIN INSTILLATION   . TRANSURETHRAL RESECTION OF BLADDER TUMOR N/A 09/30/2013   Procedure: TRANSURETHRAL RESECTION OF BLADDER TUMOR (TURBT);  Surgeon: [name], MD;  Location: [facility];  Service: Urology;  Laterality: N/A;  . TRANSURETHRAL RESECTION OF BLADDER TUMOR N/A 03/31/2014   Procedure: TRANSURETHRAL RESECTION OF BLADDER TUMOR (TURBT);  Surgeon: [name], MD;  Location: [facility];  Service: Urology;  Laterality: N/A;  . TRANSURETHRAL RESECTION OF BLADDER TUMOR N/A 05/31/2015   Procedure: Cystoscopy with clott evacuation and fulgeration of bleeders, retrograde pylegram;  Surgeon: [name], MD;  Location: [facility];  Service: Urology;  Laterality: N/A;    Current Outpatient Medications  Medication Sig Dispense Refill  . acetaminophen (TYLENOL) 500 MG tablet Take 500 mg by mouth every 6 (six) hours as needed (pain).    Marland Kitchen aspirin 81 MG chewable tablet Chew 1 tablet (81 mg total) by mouth daily. (Patient taking differently: Chew 81 mg by mouth daily after breakfast.)    . glimepiride (AMARYL) 2 MG tablet 2 (two) times daily.    . hydrALAZINE (APRESOLINE) 50 MG tablet TAKE 1.5 TABLETS (75 MG TOTAL) BY MOUTH 3 (THREE) TIMES DAILY. 405 tablet 1  . metoprolol succinate (TOPROL-XL) 25 MG 24 hr tablet TAKE 3 TABLES TOTAL DAILY (Patient taking differently: 3 (three) times daily.) 270 tablet 3  . sitaGLIPtin (JANUVIA) 100 MG tablet Take 100 mg by mouth every morning. (1100)     No current facility-administered medications for this visit.   Allergies:  Lipitor [atorvastatin], Other, and Pravastatin   Social History: The patient  reports that he quit smoking about 27 years ago. His smoking use included cigarettes. He has a 30.00 pack-year smoking history. He  has never used smokeless tobacco. He reports that he does not drink alcohol and does not use drugs.   Family History: The patient's family history includes Asthma in his mother; Cancer in his child.   ROS:  Please see the history of present illness. Otherwise, complete review of systems is positive for none.  All other systems are reviewed and negative.   Physical Exam: VS:  BP 124/68   Pulse 67   Ht '5\' 8"'  (1.727 m)   Wt 224 lb (101.6 kg)   SpO2 98%   BMI 34.06 kg/m , BMI Body mass index is 34.06 kg/m.  Wt Readings from Last 3 Encounters:  01/11/21 224 lb (101.6  kg)  01/04/21 224 lb (101.6 kg)  10/06/20 220 lb (99.8 kg)    General: Patient appears comfortable at rest. Neck: Supple, no elevated JVP or carotid bruits, no thyromegaly. Lungs: Clear to auscultation, nonlabored breathing at rest. Cardiac: Regular rate and rhythm, no S3 or significant systolic murmur, no pericardial rub. Extremities: No pitting edema, distal pulses 2+. Skin: Warm and dry. Musculoskeletal: No kyphosis. Neuropsychiatric: Alert and oriented x3, affect grossly appropriate.  ECG:  EKG January 04, 2021 Normal sinus rhythm rate of 65, left axis deviation, cannot rule out anterior infarct, age undetermined.  Recent Labwork: 01/04/2021: BUN 24; Creatinine, Ser 1.62; Hemoglobin 15.3; Platelets 187; Potassium 4.4; Sodium 140     Component Value Date/Time   CHOL 138 11/12/2016 0101   TRIG 197 (H) 11/12/2016 0101   HDL 25 (L) 11/12/2016 0101   CHOLHDL 5.5 11/12/2016 0101   VLDL 39 11/12/2016 0101   LDLCALC 74 11/12/2016 0101    Other Studies Reviewed Today:  06/2014 Cath FINDINGS: Hemodynamics:  Central Aortic Pressure / Mean: 108/62/83 mmHg  Left Ventricular Pressure / LVEDP: 107/7/17 mmHg  Left Ventriculography:  EF:~45% %  Wall Motion:global Hypokinesis  Coronary Anatomy:  Dominance: Right  Left Main: Large caliber, long trunk that trifurcates into the LAD, Circumflex & Ramus Intermedius. Angiographically normal. KGS:UPJSRP as a large caliber vessel that tapers into a relatively small caliber (~1.5-2.0 mm) vessel distally. It wraps the apex, perfusing the distal 1/3 of the infero-apex. The distal vessel has tandem 60&70-80% focal lesions prior to the apex.  D1:Very small caliber bifurcating vessel with proximal ~70% stenosis.  Left Circumflex:Begins as a large caliber vessel that also tapers to a small to moderate caliber vessel in the AV Groove where it gives off a small caliber OM1 branch and is 100% occluded just beyond that point.    Post-PTCA: beyond the occluded AV Groove Circumflex, there is a small to moderate caliber LPL branch.  Ramus intermedius:Large caliber vessel that bifurcates in the mid-vessel into to small to moderate caliber branches that both reach the apex.   RCA: Large caliber, dominant vessel with diffuse ~20-40% lesions, but no obstructive disease. It bifurcates distally in to a small caliber, short PDA and Right Posterior AV Groove Branch (RPAV) branches. RPAV gives off several very small banches.  After reviewing the initial angiography, the culprit lesion was thought to be the occluded distal-AV Groove circumflesx. Preparation were made to proceed with PTCA on this lesion.  Percutaneous Coronary Intervention:  Guide: 6 Fr CLS 3.5 -- very difficult guide support, catheter moved &disengaged with patient movement Guidewire: BMW (advanced initially in to an atrial branch for support & balloon advanced into proximal Circumflex allowing for further advancmemt of the wire beyond the culprit lesion. Predilation Balloon #1: Emerge 1.5 mm x 12 mm; Used to  assist with wire support to advance guidewire ? 6 Atm x 30 Sec, 8 Atm x 30 Sec ? Post inflation revealed a small to moderate follow-on circumflex with LPL branch.  ? Due to the relatively small sized distal vessel, the decision was to perform PTCA only. Balloon #2: Euphora 2.0 mm x 12 mm;  ? 8 Atm x 90 Sec x 2 with IC NTG - no recoil ? Final Diameter: ~2.0 mm  Post deployment angiography in multiple views, with and without guidewire in place revealed excellent stent deployment and lesion coverage. There was no evidence of dissection or perforation.  POST-OPERATIVE DIAGNOSIS:   Severe 2 vessel disease in small caliber vessel - distal AV Groove Circumflex 100% and distal LAD ~60&80% lesions (1.5 mm vessel)  Successful PTCA of the occluded AV Groove Circumflex with Stent-like result.  Moderately reduced LVEF by LV Gram  Normal  LVEDP   06/2014 Echo Study Conclusions  - Left ventricle: The cavity size was normal. Wall thickness was normal. Systolic function was normal. The estimated ejection fraction was in the range of 50% to 55%. Inferior hypokinesis. Doppler parameters are consistent with abnormal left ventricular relaxation (grade 1 diastolic dysfunction). The E/e&' ratio is 15, suggesting borderline elevated LV filling pressure. - Aortic valve: Trileaflet. Sclerosis without stenosis. There was no regurgitation. - Left atrium: The atrium was normal in size. - Tricuspid valve: There was mild regurgitation. - Pulmonary arteries: PA peak pressure: 33 mm Hg (S).  Impressions:  - LVEF 50-55%, inferior hypokinesis, mild TR, top normal RVSP, diastolic dysfunction with borderline elevated LV filling pressure.   11/2016 echo Study Conclusions  - Left ventricle: The cavity size was normal. Wall thickness was normal. Systolic function was mildly to moderately reduced. The estimated ejection fraction was in the range of 40% to 45%. There is akinesis of the basal-midinferior and inferoseptal myocardium. Doppler parameters are consistent with abnormal left ventricular relaxation (grade 1 diastolic dysfunction). - Aortic valve: Trileaflet; mildly thickened, mildly calcified leaflets. There was trivial regurgitation.  Impressions:  - EF is mildly reduced when compared to prior.   11/2016 cath  There is mild left ventricular systolic dysfunction.  The left ventricular ejection fraction is 45-50% by visual estimate.  Mid RCA-2 lesion, 25 %stenosed.  Patent mid RCA stent.  Ost 2nd Diag to 2nd Diag lesion, 80 %stenosed. Unchanged from prior.  Dist LAD lesion, 60 %stenosed. Unchanged from prior.  Mid Cx to Dist Cx lesion, 100 %stenosed. This lesion was treated with balloon angioplasty with a 2.0 balloon. Several inflations were performed.  Post intervention, there  is a 0% residual stenosis. The vessel was too small to be stented. There is also significant tortuosity in the proximal vessel.  LV end diastolic pressure is mildly elevated.  Initially, Brilinta was given for antiplatelet therapy in the Cath Lab. Since he did not receive a stent, we will transition him to clopidogrel. He needs aggressive secondary prevention including lipid-lowering therapy. He has been intolerant to statins in the past. Would need to consider PCS K9 inhibitor along with other secondary prevention. I anticipate he'll be here 2 days in the hospital. Echocardiogram would be reasonable to check ejection fraction and mitral apparatus.  LAD and diagonal disease was not addressed since they appeared unchanged angiographically. If he has refractory angina, could address the LAD lesion at a later time.  Jan 2018 AAA [place] No AAA   Jan 2019 nuclear stress  No diagnostic ST segment changes. Occasional PVCs noted throughout the study.  Hypertensive response. Technically low risk Duke treadmill score 4.5.  Blood pressure demonstrated a hypertensive response to exercise.  Small, moderate intensity, partially reversible mid to apical anterior defect consistent with ischemia.  This is a high risk study predominantly based on calculated LVEF. Consider echocardiogram for further confirmation.  Nuclear stress EF: 25%.   02/2018 echo Study Conclusions  - Left ventricle: Mid and basal inferior wall hypokinesis. The cavity size was mildly dilated. Wall thickness was normal. Systolic function was mildly to moderately reduced. The estimated ejection fraction was in the range of 40% to 45%. - Aortic valve: There was trivial regurgitation. Valve area (VTI): 1.51 cm^2. Valve area (Vmax): 1.75 cm^2. Valve area (Vmean): 1.64 cm^2. - Atrial septum: No defect or patent foramen ovale was identified. - Pulmonary arteries: PA peak pressure: 37 mm Hg (S).    Assessment and  Plan:  1. Preoperative clearance   2. CAD in native artery   3. Essential hypertension   4. Mixed hyperlipidemia    1. Preoperative clearance Patient has pending transrectal prostate ultrasound with biopsy and cystoscopy with bilateral retrogrades and bladder biopsies with Dr. Diona Fanti.  His Duke activity status index is 58 giving him a functional capacity of 9.89 METS.  His revised cardiac index equals 1 placing him at a perioperative risk of major adverse cardiac event at 0.9%.  From a cardiac standpoint patient is cleared to undergo scheduled procedure.  May hold aspirin for 5 days prior to surgery  2. CAD in native artery Has any anginal or exertional symptoms.  Continue aspirin but may hold to 5 days prior to pending procedure with urology.  3. Essential hypertension Blood pressure well controlled today at 124/68.  Continue hydralazine 75 mg p.o. 3 times daily continue Toprol-XL 75 mg 3 times daily.  4. Mixed hyperlipidemia Currently on no statins or antihyperlipemic medications.  Previously on Zetia  Medication Adjustments/Labs and Tests Ordered: Current medicines are reviewed at length with the patient today.  Concerns regarding medicines are outlined above.   Disposition: Follow-up with Dr. Harl Bowie or APP 1 year  Signed, Levell July, NP 01/11/2021 1:22 PM    [facility] at [place], [place], [place] [address] Phone: [phone]; Fax: [phone]

## 2021-01-11 ENCOUNTER — Ambulatory Visit (INDEPENDENT_AMBULATORY_CARE_PROVIDER_SITE_OTHER): Payer: Medicare HMO | Admitting: Family Medicine

## 2021-01-11 ENCOUNTER — Encounter: Payer: Self-pay | Admitting: Family Medicine

## 2021-01-11 VITALS — BP 124/68 | HR 67 | Ht 68.0 in | Wt 224.0 lb

## 2021-01-11 DIAGNOSIS — I2510 Atherosclerotic heart disease of native coronary artery without angina pectoris: Secondary | ICD-10-CM | POA: Diagnosis not present

## 2021-01-11 DIAGNOSIS — I10 Essential (primary) hypertension: Secondary | ICD-10-CM | POA: Diagnosis not present

## 2021-01-11 DIAGNOSIS — Z01810 Encounter for preprocedural cardiovascular examination: Secondary | ICD-10-CM | POA: Diagnosis not present

## 2021-01-11 DIAGNOSIS — E782 Mixed hyperlipidemia: Secondary | ICD-10-CM | POA: Diagnosis not present

## 2021-01-11 DIAGNOSIS — G720 Drug-induced myopathy: Secondary | ICD-10-CM

## 2021-01-11 DIAGNOSIS — T466X5D Adverse effect of antihyperlipidemic and antiarteriosclerotic drugs, subsequent encounter: Secondary | ICD-10-CM | POA: Diagnosis not present

## 2021-01-11 DIAGNOSIS — Z01818 Encounter for other preprocedural examination: Secondary | ICD-10-CM

## 2021-01-11 NOTE — Patient Instructions (Signed)
Medication Instructions:  Your physician recommends that you continue on your current medications as directed. Please refer to the Current Medication list given to you today.  *If you need a refill on your cardiac medications before your next appointment, please call your pharmacy*   Lab Work: None Today If you have labs (blood work) drawn today and your tests are completely normal, you will receive your results only by: Marland Kitchen MyChart Message (if you have MyChart) OR . A paper copy in the mail If you have any lab test that is abnormal or we need to change your treatment, we will call you to review the results.   Testing/Procedures: None Today   Follow-Up: At [facility], you and your health needs are our priority.  As part of our continuing mission to provide you with exceptional heart care, we have created designated Provider Care Teams.  These Care Teams include your primary Cardiologist (physician) and Advanced Practice Providers (APPs -  Physician Assistants and Nurse Practitioners) who all work together to provide you with the care you need, when you need it.  We recommend signing up for the patient portal called "MyChart".  Sign up information is provided on this After Visit Summary.  MyChart is used to connect with patients for Virtual Visits (Telemedicine).  Patients are able to view lab/test results, encounter notes, upcoming appointments, etc.  Non-urgent messages can be sent to your provider as well.   To learn more about what you can do with MyChart, go to [email or web address].    Your next appointment:   12 month(s)  The format for your next appointment:   In Person  Provider:   Katina Dung, NP   Other Instructions None Today

## 2021-02-01 DIAGNOSIS — I10 Essential (primary) hypertension: Secondary | ICD-10-CM | POA: Diagnosis not present

## 2021-02-01 DIAGNOSIS — E1129 Type 2 diabetes mellitus with other diabetic kidney complication: Secondary | ICD-10-CM | POA: Diagnosis not present

## 2021-02-02 DIAGNOSIS — Z0000 Encounter for general adult medical examination without abnormal findings: Secondary | ICD-10-CM | POA: Diagnosis not present

## 2021-02-02 DIAGNOSIS — E6609 Other obesity due to excess calories: Secondary | ICD-10-CM | POA: Diagnosis not present

## 2021-02-02 DIAGNOSIS — E1140 Type 2 diabetes mellitus with diabetic neuropathy, unspecified: Secondary | ICD-10-CM | POA: Diagnosis not present

## 2021-02-02 DIAGNOSIS — I10 Essential (primary) hypertension: Secondary | ICD-10-CM | POA: Diagnosis not present

## 2021-02-02 DIAGNOSIS — Z1331 Encounter for screening for depression: Secondary | ICD-10-CM | POA: Diagnosis not present

## 2021-02-02 DIAGNOSIS — C675 Malignant neoplasm of bladder neck: Secondary | ICD-10-CM | POA: Diagnosis not present

## 2021-02-02 DIAGNOSIS — Z6835 Body mass index (BMI) 35.0-35.9, adult: Secondary | ICD-10-CM | POA: Diagnosis not present

## 2021-02-02 DIAGNOSIS — M779 Enthesopathy, unspecified: Secondary | ICD-10-CM | POA: Diagnosis not present

## 2021-02-02 DIAGNOSIS — B360 Pityriasis versicolor: Secondary | ICD-10-CM | POA: Diagnosis not present

## 2021-02-02 DIAGNOSIS — I2510 Atherosclerotic heart disease of native coronary artery without angina pectoris: Secondary | ICD-10-CM | POA: Diagnosis not present

## 2021-02-02 DIAGNOSIS — E1129 Type 2 diabetes mellitus with other diabetic kidney complication: Secondary | ICD-10-CM | POA: Diagnosis not present

## 2021-02-02 DIAGNOSIS — Z1389 Encounter for screening for other disorder: Secondary | ICD-10-CM | POA: Diagnosis not present

## 2021-02-02 DIAGNOSIS — E118 Type 2 diabetes mellitus with unspecified complications: Secondary | ICD-10-CM | POA: Diagnosis not present

## 2021-02-10 ENCOUNTER — Other Ambulatory Visit: Payer: Self-pay

## 2021-02-10 ENCOUNTER — Encounter (HOSPITAL_BASED_OUTPATIENT_CLINIC_OR_DEPARTMENT_OTHER): Payer: Self-pay | Admitting: Urology

## 2021-02-10 NOTE — Progress Notes (Signed)
Spoke w/ via phone for pre-op interview---pt wife Timothy [name] needs dos---- I stat               [name] results-----see below COVID test ------02-11-2021 1130 Arrive at -------600 am 02-15-2021 NPO after MN NO Solid Food.  Water from MN until---500 am then npo Medications to take morning of surgery -----hydrazaline, metorpolol succinate Diabetic medication -----none day of surgery Med red completed Bring photo id and insurance card [employer] wife vyonne will stay Patient Special Instructions -----fleets enema day of surgery Pre-Op special Istructions -----n/a Patient verbalized understanding of instructions that were given at this phone interview. Patient denies shortness of breath, chest pain, fever, cough at this phone interview.  anesthesia : cardiac clearance note 01-11-2021 chart/epic,    PCP:dr lawrence fusco Cardiologist : dr branch Cassell Clement 03-24-2020 note says f/u in 6 months Chest x-ray :none EKG :01-04-2021 epic Echo :02-21-2018 epic Stress test: 01-02-2018 epic Cardiac Cath : last done 11-11-2016 epic Activity level: does housework and can climb staits without problems lov vascular matthew eveland pa 04-27-2020 epic peripheral vasc cath 8-4-20220 epic Sleep Study/ CPAP : none Fasting Blood Sugar : 150     / Checks Blood Sugar -1- time a day:   Blood Thinner/ Instructions /Last Dose:n/a ASA / Instructions/ Last Dose : instructed per dr Diona Fanti to stop 81 mg aspirin 5 days before surgery  lov dr Purcell Nails fusco 11-10-2020 on chart  Reviewed pre op instructions with wife Timothy    Electronically signed by Derrek Monaco, RN at 01/05/2021 4:00 PM  Electronically signed by Derrek Monaco, RN at 01/05/2021 4:01 PM  Electronically signed by Derrek Monaco, RN at 01/06/2021 8:38 AM  Electronically signed by Derrek Monaco, RN at 01/06/2021 11:55 AM  Pre-admit (Canceled) on 01/07/2021      Pre-admit (Canceled) on 01/07/2021         Revision  History         Detailed Report       Note shared with patient

## 2021-02-11 ENCOUNTER — Other Ambulatory Visit (HOSPITAL_COMMUNITY)
Admission: RE | Admit: 2021-02-11 | Discharge: 2021-02-11 | Disposition: A | Discharge status: Home / Self Care | Payer: Medicare HMO | Source: Ambulatory Visit | Attending: Urology | Admitting: Urology

## 2021-02-11 DIAGNOSIS — Z01812 Encounter for preprocedural laboratory examination: Secondary | ICD-10-CM | POA: Diagnosis not present

## 2021-02-11 DIAGNOSIS — Z20822 Contact with and (suspected) exposure to covid-19: Secondary | ICD-10-CM | POA: Diagnosis not present

## 2021-02-11 LAB — SARS CORONAVIRUS 2 (TAT 6-24 HRS): SARS Coronavirus 2: NEGATIVE

## 2021-02-15 ENCOUNTER — Encounter (HOSPITAL_BASED_OUTPATIENT_CLINIC_OR_DEPARTMENT_OTHER): Payer: Self-pay | Admitting: Urology

## 2021-02-15 ENCOUNTER — Ambulatory Visit (HOSPITAL_COMMUNITY)
Admission: RE | Admit: 2021-02-15 | Discharge: 2021-02-15 | Disposition: A | Discharge status: Home / Self Care | Payer: Medicare HMO | Attending: Urology | Admitting: Urology

## 2021-02-15 ENCOUNTER — Ambulatory Visit (HOSPITAL_BASED_OUTPATIENT_CLINIC_OR_DEPARTMENT_OTHER): Payer: Medicare HMO | Admitting: Anesthesiology

## 2021-02-15 ENCOUNTER — Ambulatory Visit (HOSPITAL_COMMUNITY)
Admission: RE | Admit: 2021-02-15 | Discharge: 2021-02-15 | Disposition: A | Discharge status: Home / Self Care | Payer: Medicare HMO | Source: Ambulatory Visit | Attending: Urology | Admitting: Urology

## 2021-02-15 ENCOUNTER — Encounter (HOSPITAL_BASED_OUTPATIENT_CLINIC_OR_DEPARTMENT_OTHER)
Admission: RE | Disposition: A | Discharge status: Home / Self Care | Payer: Self-pay | Source: Home / Self Care | Attending: Urology

## 2021-02-15 DIAGNOSIS — I110 Hypertensive heart disease with heart failure: Secondary | ICD-10-CM | POA: Insufficient documentation

## 2021-02-15 DIAGNOSIS — Z8546 Personal history of malignant neoplasm of prostate: Secondary | ICD-10-CM | POA: Insufficient documentation

## 2021-02-15 DIAGNOSIS — Z87891 Personal history of nicotine dependence: Secondary | ICD-10-CM | POA: Diagnosis not present

## 2021-02-15 DIAGNOSIS — I509 Heart failure, unspecified: Secondary | ICD-10-CM | POA: Insufficient documentation

## 2021-02-15 DIAGNOSIS — I214 Non-ST elevation (NSTEMI) myocardial infarction: Secondary | ICD-10-CM | POA: Diagnosis not present

## 2021-02-15 DIAGNOSIS — C61 Malignant neoplasm of prostate: Secondary | ICD-10-CM | POA: Diagnosis not present

## 2021-02-15 DIAGNOSIS — C678 Malignant neoplasm of overlapping sites of bladder: Secondary | ICD-10-CM

## 2021-02-15 DIAGNOSIS — Z955 Presence of coronary angioplasty implant and graft: Secondary | ICD-10-CM | POA: Diagnosis not present

## 2021-02-15 DIAGNOSIS — N3020 Other chronic cystitis without hematuria: Secondary | ICD-10-CM | POA: Diagnosis not present

## 2021-02-15 DIAGNOSIS — N329 Bladder disorder, unspecified: Secondary | ICD-10-CM | POA: Insufficient documentation

## 2021-02-15 DIAGNOSIS — C679 Malignant neoplasm of bladder, unspecified: Secondary | ICD-10-CM

## 2021-02-15 DIAGNOSIS — Z08 Encounter for follow-up examination after completed treatment for malignant neoplasm: Secondary | ICD-10-CM | POA: Diagnosis not present

## 2021-02-15 DIAGNOSIS — Z7984 Long term (current) use of oral hypoglycemic drugs: Secondary | ICD-10-CM | POA: Diagnosis not present

## 2021-02-15 DIAGNOSIS — Z888 Allergy status to other drugs, medicaments and biological substances status: Secondary | ICD-10-CM | POA: Diagnosis not present

## 2021-02-15 DIAGNOSIS — N4289 Other specified disorders of prostate: Secondary | ICD-10-CM | POA: Diagnosis not present

## 2021-02-15 DIAGNOSIS — Z8551 Personal history of malignant neoplasm of bladder: Secondary | ICD-10-CM | POA: Insufficient documentation

## 2021-02-15 DIAGNOSIS — N411 Chronic prostatitis: Secondary | ICD-10-CM | POA: Diagnosis not present

## 2021-02-15 DIAGNOSIS — E785 Hyperlipidemia, unspecified: Secondary | ICD-10-CM | POA: Diagnosis not present

## 2021-02-15 HISTORY — PX: CYSTOSCOPY WITH BIOPSY: SHX5122

## 2021-02-15 HISTORY — PX: CYSTOSCOPY W/ RETROGRADES: SHX1426

## 2021-02-15 HISTORY — PX: PROSTATE BIOPSY: SHX241

## 2021-02-15 LAB — POCT I-STAT, CHEM 8
BUN: 19 mg/dL (ref 8–23)
Calcium, Ion: 1.23 mmol/L (ref 1.15–1.40)
Chloride: 102 mmol/L (ref 98–111)
Creatinine, Ser: 1.3 mg/dL — ABNORMAL HIGH (ref 0.61–1.24)
Glucose, Bld: 193 mg/dL — ABNORMAL HIGH (ref 70–99)
HCT: 48 % (ref 39.0–52.0)
Hemoglobin: 16.3 g/dL (ref 13.0–17.0)
Potassium: 4.3 mmol/L (ref 3.5–5.1)
Sodium: 140 mmol/L (ref 135–145)
TCO2: 25 mmol/L (ref 22–32)

## 2021-02-15 LAB — GLUCOSE, CAPILLARY: Glucose-Capillary: 171 mg/dL — ABNORMAL HIGH (ref 70–99)

## 2021-02-15 SURGERY — BIOPSY, PROSTATE, RECTAL APPROACH, WITH US GUIDANCE
Anesthesia: General | Site: Ureter | Laterality: Right

## 2021-02-15 MED ORDER — LACTATED RINGERS IV SOLN: INTRAVENOUS | Status: DC

## 2021-02-15 MED ORDER — CEFAZOLIN SODIUM-DEXTROSE 2-4 GM/100ML-% IV SOLN
2.0000 g | INTRAVENOUS | Status: AC
  Administered 2021-02-15: 2 g via INTRAVENOUS

## 2021-02-15 MED ORDER — PHENYLEPHRINE 40 MCG/ML (10ML) SYRINGE FOR IV PUSH (FOR BLOOD PRESSURE SUPPORT)
PREFILLED_SYRINGE | INTRAVENOUS | Status: DC | PRN
  Administered 2021-02-15: 120 ug via INTRAVENOUS

## 2021-02-15 MED ORDER — PROPOFOL 10 MG/ML IV BOLUS
INTRAVENOUS | Status: AC
  Filled 2021-02-15: qty 40

## 2021-02-15 MED ORDER — ONDANSETRON HCL 4 MG/2ML IJ SOLN: 4.0000 mg | Freq: Once | INTRAMUSCULAR | Status: DC | PRN

## 2021-02-15 MED ORDER — OXYCODONE HCL 5 MG PO TABS
ORAL_TABLET | ORAL | Status: AC
  Filled 2021-02-15: qty 1

## 2021-02-15 MED ORDER — FENTANYL CITRATE (PF) 100 MCG/2ML IJ SOLN
INTRAMUSCULAR | Status: AC
  Filled 2021-02-15: qty 2

## 2021-02-15 MED ORDER — AMISULPRIDE (ANTIEMETIC) 5 MG/2ML IV SOLN: 10.0000 mg | Freq: Once | INTRAVENOUS | Status: DC | PRN

## 2021-02-15 MED ORDER — ONDANSETRON HCL 4 MG/2ML IJ SOLN
INTRAMUSCULAR | Status: AC
  Filled 2021-02-15: qty 2

## 2021-02-15 MED ORDER — CEFAZOLIN SODIUM-DEXTROSE 2-4 GM/100ML-% IV SOLN
INTRAVENOUS | Status: AC
  Filled 2021-02-15: qty 100

## 2021-02-15 MED ORDER — OXYCODONE HCL 5 MG/5ML PO SOLN
5.0000 mg | Freq: Once | ORAL | Status: AC | PRN
Start: 2021-02-15 — End: 2021-02-15

## 2021-02-15 MED ORDER — EPHEDRINE SULFATE-NACL 50-0.9 MG/10ML-% IV SOSY
PREFILLED_SYRINGE | INTRAVENOUS | Status: DC | PRN
  Administered 2021-02-15: 10 mg via INTRAVENOUS

## 2021-02-15 MED ORDER — IOHEXOL 300 MG/ML  SOLN
INTRAMUSCULAR | Status: DC | PRN
  Administered 2021-02-15: 4 mL

## 2021-02-15 MED ORDER — FENTANYL CITRATE (PF) 100 MCG/2ML IJ SOLN
25.0000 ug | INTRAMUSCULAR | Status: DC | PRN
Start: 2021-02-15 — End: 2021-02-15
  Administered 2021-02-15: 25 ug via INTRAVENOUS

## 2021-02-15 MED ORDER — ONDANSETRON HCL 4 MG/2ML IJ SOLN
INTRAMUSCULAR | Status: DC | PRN
  Administered 2021-02-15: 4 mg via INTRAVENOUS

## 2021-02-15 MED ORDER — PROPOFOL 10 MG/ML IV BOLUS
INTRAVENOUS | Status: DC | PRN
  Administered 2021-02-15: 150 mg via INTRAVENOUS

## 2021-02-15 MED ORDER — LIDOCAINE 2% (20 MG/ML) 5 ML SYRINGE
INTRAMUSCULAR | Status: AC
  Filled 2021-02-15: qty 5

## 2021-02-15 MED ORDER — FENTANYL CITRATE (PF) 100 MCG/2ML IJ SOLN
INTRAMUSCULAR | Status: DC | PRN
  Administered 2021-02-15: 50 ug via INTRAVENOUS

## 2021-02-15 MED ORDER — LIDOCAINE 2% (20 MG/ML) 5 ML SYRINGE
INTRAMUSCULAR | Status: DC | PRN
  Administered 2021-02-15: 100 mg via INTRAVENOUS

## 2021-02-15 MED ORDER — OXYCODONE HCL 5 MG PO TABS
5.0000 mg | ORAL_TABLET | Freq: Once | ORAL | Status: AC | PRN
  Administered 2021-02-15: 5 mg via ORAL

## 2021-02-15 MED ORDER — CEPHALEXIN 500 MG PO CAPS: 500.0000 mg | ORAL_CAPSULE | Freq: Two times a day (BID) | ORAL | 0 refills | Status: AC

## 2021-02-15 SURGICAL SUPPLY — 43 items
BAG DRAIN URO-CYSTO SKYTR STRL (DRAIN) ×3 IMPLANT
BAG DRN RND TRDRP ANRFLXCHMBR (UROLOGICAL SUPPLIES) ×2
BAG DRN UROCATH (DRAIN) ×2
BAG URINE DRAIN 2000ML AR STRL (UROLOGICAL SUPPLIES) ×1 IMPLANT
BASKET ZERO TIP NITINOL 2.4FR (BASKET) IMPLANT
BSKT STON RTRVL ZERO TP 2.4FR (BASKET)
CATH FOLEY 2WAY SLVR  5CC 18FR (CATHETERS) ×3
CATH FOLEY 2WAY SLVR 5CC 18FR (CATHETERS) IMPLANT
CATH INTERMIT  6FR 70CM (CATHETERS) ×3 IMPLANT
CLOTH BEACON ORANGE TIMEOUT ST (SAFETY) ×3 IMPLANT
DRSG TELFA 3X8 NADH (GAUZE/BANDAGES/DRESSINGS) ×3 IMPLANT
ELECT REM PT RETURN 9FT ADLT (ELECTROSURGICAL) ×3
ELECTRODE REM PT RTRN 9FT ADLT (ELECTROSURGICAL) ×2 IMPLANT
FIBER LASER FLEXIVA 365 (UROLOGICAL SUPPLIES) IMPLANT
GLOVE SURG ENC MOIS LTX SZ8 (GLOVE) ×3 IMPLANT
GOWN STRL REUS W/TWL LRG LVL3 (GOWN DISPOSABLE) ×6 IMPLANT
GUIDEWIRE ANG ZIPWIRE 038X150 (WIRE) IMPLANT
GUIDEWIRE STR DUAL SENSOR (WIRE) ×1 IMPLANT
HOLDER FOLEY CATH W/STRAP (MISCELLANEOUS) ×1 IMPLANT
INST BIOPSY MAXCORE 18GX25 (NEEDLE) ×1 IMPLANT
INSTR BIOPSY MAXCORE 18GX20 (NEEDLE) IMPLANT
IV NS IRRIG 3000ML ARTHROMATIC (IV SOLUTION) ×5 IMPLANT
KIT TURNOVER CYSTO (KITS) ×3 IMPLANT
LOOP CUT BIPOLAR 24F LRG (ELECTROSURGICAL) ×1 IMPLANT
MANIFOLD NEPTUNE II (INSTRUMENTS) ×3 IMPLANT
NDL SAFETY ECLIPSE 18X1.5 (NEEDLE) IMPLANT
NDL SPNL 22GX7 QUINCKE BK (NEEDLE) IMPLANT
NEEDLE HYPO 18GX1.5 SHARP (NEEDLE)
NEEDLE HYPO 22GX1.5 SAFETY (NEEDLE) IMPLANT
NEEDLE SPNL 22GX7 QUINCKE BK (NEEDLE) IMPLANT
NS IRRIG 500ML POUR BTL (IV SOLUTION) ×3 IMPLANT
PACK CYSTO (CUSTOM PROCEDURE TRAY) ×3 IMPLANT
PAD DRESSING TELFA 3X8 NADH (GAUZE/BANDAGES/DRESSINGS) IMPLANT
SHEATH URETERAL 12FRX35CM (MISCELLANEOUS) IMPLANT
SYR 20ML LL LF (SYRINGE) IMPLANT
SYR CONTROL 10ML LL (SYRINGE) IMPLANT
TOWEL OR 17X26 10 PK STRL BLUE (TOWEL DISPOSABLE) IMPLANT
TRACTIP FLEXIVA PULS ID 200XHI (Laser) IMPLANT
TRACTIP FLEXIVA PULSE ID 200 (Laser)
TUBE CONNECTING 12X1/4 (SUCTIONS) ×1 IMPLANT
TUBING UROLOGY SET (TUBING) IMPLANT
UNDERPAD 30X36 HEAVY ABSORB (UNDERPADS AND DIAPERS) ×3 IMPLANT
WATER STERILE IRR 3000ML UROMA (IV SOLUTION) ×2 IMPLANT

## 2021-02-15 NOTE — H&P (Signed)
H&P  Chief Complaint: History of bladder and prostate cancer with recent abnormal cystoscopy  History of Present Illness: This 75 year old male has a longstanding history of prostate cancer, low-grade, being followed with active surveillance and high-grade nonmuscle invasive bladder cancer.  Cystoscopy in November, 2021 revealed abnormalities in the anterior bladder which were somewhat suspicious.  He presents at this time for cystoscopy, bilateral retrograde ureteral pyelograms, bladder biopsies, transrectal ultrasound and biopsy of the prostate.  His history as follows:  Initial TURBT on 4.10.2014. He had papillary, low-grade, NMIBC. He eventually was found to have recurrence within 6 months, and underwent repeat TURBT on 10.27.2014. Again, this was low-grade, NMIBC. Because of his recurrence in a short period of time, despite using mitomycin after his first and second treatments, he underwent BCG induction, which was completed in January, 2014. He did have a significant inflammatory response to the mitomycin.   In April, 2015 at routine follow-up, he was found to have 2 recurrences, one approximately 1.5 and 1 approximately 1.0 cm. He underwent TURBT on 4.27.2015. This revealed high-grade NMIBC. He completed a second BCG induction course in early August, 2015. He subsequently underwent cystoscopy, bilateral retrogrades, and bladder biopsy on 5.3.2016. Biopsies of the bladder were negative. Retrograde studies were normal. He has had significant dysuria during his treatments. He has completed 3 maintenance BCG treatments, the last of which was 11.29.2016.   Because of the patient's significant side effects from his BCG i.e. dysuria, bladder pain, we stopped maintenance therapy after his 11.29.2016 dose.   8.26.2019: Because of positive cytologies, he underwent cystoscopy, bilateral retrogrades/renal washings, bladder barbotage and bladder biopsy. Bladder washings were negative, barbotage specimen  negative, biopsies revealed only benign/inflammatory change.   10.8.2019: No gross hematuria. Dysuria from procedure has essentially resolved.   2.11.2020: Cysto--stable scars. Cytology negative.   8.18.2020: Cysto today. No recent gross hematuria or urinary complaints.   Prostate Cancer:  He is participating in active surveillance.   Because of a rising PSA, he underwent a concurrent transrectal ultrasound and biopsy of his prostate on 4.27.2015. Prostatic volume was 53.55 cm. PSA was 9.13. PSA density 0.17. 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 That biopsy was left base lateral, and less than 5% of the core was involved. There was no granulomatous reaction noted. He underwent repeat/surveillance biopsy on 1.8.2015. Prostatic volume was measured at 53.5 mL. All 12 cores returned negative for adenocarcinoma, with 1 core in the right apex revealing chronic granulomatous inflammation.   2.11.2020: PSA 8.7     Past Medical History:  Diagnosis Date  . Arthritis   . Bladder cancer ([place])   . Blood clot in vein 2018   left  . CAD (coronary artery disease)    a. 06/2014: NSTEMI s/p PTCA of LCx, 60-80% residual in distal LAD, 70% prox small D1.   b. 02/06/15 NSTEMI  s/p successful PCI/DES to mid RCA  . Decreased GFR 45 % per 01-04-2021 labs  . Diabetes mellitus, type 2 ([place])   . Elevated PSA   . HLD (hyperlipidemia)   . HOH (hard of hearing)   . Hypertension   . NSTEMI (non-ST elevated myocardial infarction) ([place]) 2015  . Peripheral vascular disease ([place])   . Poor historian   . Wears hearing aid in right ear     Past Surgical History:  Procedure Laterality Date  . ABDOMINAL AORTOGRAM W/LOWER EXTREMITY N/A 08/28/2018   Procedure: ABDOMINAL AORTOGRAM W/LOWER EXTREMITY;  Surgeon: Serafina Mitchell, MD;  Location: [facility];  Service: Cardiovascular;  Laterality: N/A;  bilateral  . ABDOMINAL AORTOGRAM W/LOWER EXTREMITY N/A 07/09/2019   Procedure: ABDOMINAL AORTOGRAM W/LOWER  EXTREMITY;  Surgeon: Serafina Mitchell, MD;  Location: [facility];  Service: Cardiovascular;  Laterality: N/A;  unilateral Lt  . CARDIAC CATHETERIZATION  02/06/2015   Procedure: CORONARY STENT INTERVENTION;  Surgeon: [name], MD;  Location[facility] CATH LAB;  Service: Cardiovascular;;  Mid RCA  . CARDIAC CATHETERIZATION N/A 11/11/2016   Procedure: Left Heart Cath and Coronary Angiography;  Surgeon: [name], MD;  Location: [place] [facility];  Service: Cardiovascular;  Laterality: N/A;  . CARDIAC CATHETERIZATION N/A 11/11/2016   Procedure: Coronary Balloon Angioplasty;  Surgeon: [name], MD;  Location: [facility];  Service: Cardiovascular;  Laterality: N/A;  . COLONOSCOPY N/A 01/14/2019   Procedure: COLONOSCOPY;  Surgeon: Rogene Houston, MD;  [facility];  Service: Endoscopy;  Laterality: N/A;  730  . CORONARY ANGIOPLASTY  11/11/2016   Mid Cx to Dist Cx lesion, 100 %stenosed. This lesion was treated with balloon angioplasty with a 2.0 balloon  . CYSTOSCOPY W/ RETROGRADES N/A 05/05/2015   Procedure: CYSTOSCOPY WITH BILATERAL RETROGRADE PYELOGRAMS AND BLADDER BIOPSIES;  Surgeon: [name], MD;  Location: [facility];  Service: Urology;  Laterality: N/A;  . CYSTOSCOPY W/ RETROGRADES Bilateral 07/31/2018   Procedure: CYSTOSCOPY WITH BILATERAL RETROGRADE PYELOGRAM;  Surgeon: [name], MD;  Location: [facility];  Service: Urology;  Laterality: Bilateral;  . CYSTOSCOPY WITH BIOPSY N/A 07/31/2018   Procedure: CYSTOSCOPY WITH BIOPSY;  Surgeon: [name], MD;  Location: [facility];  Service: Urology;  Laterality: N/A;  . LEFT HEART CATHETERIZATION WITH CORONARY ANGIOGRAM N/A 06/17/2014   Procedure: LEFT HEART CATHETERIZATION WITH CORONARY ANGIOGRAM;  Surgeon: [name], MD;  Location: [facility] CATH LAB;  Service: Cardiovascular;  Laterality: N/A;  . LEFT HEART CATHETERIZATION WITH CORONARY ANGIOGRAM N/A 02/06/2015   Procedure: LEFT HEART  CATHETERIZATION WITH CORONARY ANGIOGRAM;  Surgeon: [name], MD;  Location: [facility] CATH LAB;  Service: Cardiovascular;  Laterality: N/A;  . PERIPHERAL VASCULAR ATHERECTOMY  08/28/2018   Procedure: PERIPHERAL VASCULAR ATHERECTOMY;  Surgeon: Serafina Mitchell, MD;  Location: [facility];  Service: Cardiovascular;;  Prox lt.SFA, Distal SFA  . PERIPHERAL VASCULAR INTERVENTION  07/09/2019   Procedure: PERIPHERAL VASCULAR INTERVENTION;  Surgeon: Serafina Mitchell, MD;  Location: [facility];  Service: Cardiovascular;;  Lt. SFA  . POLYPECTOMY  01/14/2019   Procedure: POLYPECTOMY;  Surgeon: Rogene Houston, MD;  [facility];  Service: Endoscopy;;  colon  . PROSTATE BIOPSY N/A 03/31/2014   Procedure: BIOPSY TRANSRECTAL ULTRASONIC PROSTATE (TUBP);  Surgeon: [name], MD;  Location: [facility];  Service: Urology;  Laterality: N/A;  . TRANSURETHRAL RESECTION OF BLADDER TUMOR N/A 03/14/2013   Procedure: TRANSURETHRAL RESECTION OF BLADDER TUMOR (TURBT);  Surgeon: [name], MD;  Location[facility];  Service: Urology;  Laterality: N/A;  1 HR WITH MITOMYCIN INSTILLATION   . TRANSURETHRAL RESECTION OF BLADDER TUMOR N/A 09/30/2013   Procedure: TRANSURETHRAL RESECTION OF BLADDER TUMOR (TURBT);  Surgeon: [name], MD;  Location: [facility];  Service: Urology;  Laterality: N/A;  . TRANSURETHRAL RESECTION OF BLADDER TUMOR N/A 03/31/2014   Procedure: TRANSURETHRAL RESECTION OF BLADDER TUMOR (TURBT);  Surgeon: [name], MD;  Location: [facility];  Service: Urology;  Laterality: N/A;  . TRANSURETHRAL RESECTION OF BLADDER TUMOR N/A 05/31/2015   Procedure: Cystoscopy  with clott evacuation and fulgeration of bleeders, retrograde pylegram;  Surgeon: [name], MD;  Location: [facility];  Service: Urology;  Laterality: N/A;    Home Medications:    Allergies:  Allergies  Allergen Reactions  . Lipitor  [Atorvastatin] Other (See Comments)    Myalgia  . Other Other (See Comments)    All Cholesterol Meds - causes muscle and joint pain  . Pravastatin Other (See Comments)    Myalgia    Family History  Problem Relation Age of Onset  . Cancer Child   . Asthma Mother     Social History:  reports that he quit smoking about 27 years ago. His smoking use included cigarettes. He has a 30.00 pack-year smoking history. He has never used smokeless tobacco. He reports that he does not drink alcohol and does not use drugs.  ROS: A complete review of systems was performed.  All systems are negative except for pertinent findings as noted.  Physical Exam:  Vital signs in last 24 hours: BP (!) 177/84   Pulse 61   Temp 97.9 F (36.6 C) (Oral)   Resp 15   Ht 5\' 8"  (1.727 m)   Wt 101.7 kg   SpO2 99%   BMI 34.10 kg/m  Constitutional:  Alert and oriented, No acute distress Cardiovascular: Regular rate  Respiratory: Normal respiratory effort GI: Abdomen is soft, nontender, nondistended, no abdominal masses. No CVAT.  Genitourinary: Normal male phallus, testes are descended bilaterally and non-tender and without masses, scrotum is normal in appearance without lesions or masses, perineum is normal on inspection. Lymphatic: No lymphadenopathy Neurologic: Grossly intact, no focal deficits Psychiatric: Normal mood and affect  Laboratory Data:  Recent Labs    02/15/21 0701  HGB 16.3  HCT 48.0    Recent Labs    02/15/21 0701  NA 140  K 4.3  CL 102  GLUCOSE 193*  BUN 19  CREATININE 1.30*     Results for orders placed or performed during the hospital encounter of 02/15/21 (from the past 24 hour(s))  I-STAT, chem 8     Status: Abnormal   Collection Time: 02/15/21  7:01 AM  Result Value Ref Range   Sodium 140 135 - 145 mmol/L   Potassium 4.3 3.5 - 5.1 mmol/L   Chloride 102 98 - 111 mmol/L   BUN 19 8 - 23 mg/dL   Creatinine, Ser 1.30 (H) 0.61 - 1.24 mg/dL   Glucose, Bld 193 (H) 70 -  99 mg/dL   Calcium, Ion 1.23 1.15 - 1.40 mmol/L   TCO2 25 22 - 32 mmol/L   Hemoglobin 16.3 13.0 - 17.0 g/dL   HCT 48.0 39.0 - 52.0 %   Recent Results (from the past 240 hour(s))  SARS CORONAVIRUS 2 (TAT 6-24 HRS) Nasopharyngeal Nasopharyngeal Swab     Status: None   Collection Time: 02/11/21 11:16 AM   Specimen: Nasopharyngeal Swab  Result Value Ref Range Status   SARS Coronavirus 2 NEGATIVE NEGATIVE Final    Comment: (NOTE) SARS-CoV-2 target nucleic acids are NOT DETECTED.  The SARS-CoV-2 RNA is generally detectable in upper and lower respiratory specimens during the acute phase of infection. Negative results do not preclude SARS-CoV-2 infection, do not rule out co-infections with other pathogens, and should not be used as the sole basis for treatment or other patient management decisions. Negative results must be combined with clinical observations, patient history, and epidemiological information. The expected result is Negative.  Fact Sheet for Patients: SugarRoll.be  Fact Sheet  for Healthcare Providers: https://www.woods-mathews.com/  This test is not yet approved or cleared by the [place] and  has been authorized for detection and/or diagnosis of SARS-CoV-2 by FDA under an Emergency Use Authorization (EUA). This EUA will remain  in effect (meaning this test can be used) for the duration of the COVID-19 declaration under Se ction 564(b)(1) of the Act, 21 U.S.C. section 360bbb-3(b)(1), unless the authorization is terminated or revoked sooner.  Performed at [facility], [place] [address] [place], [place] [address]     Renal Function: Recent Labs    02/15/21 0701  CREATININE 1.30*   Estimated Creatinine Clearance: 58.5 mL/min (A) (by C-G formula based on SCr of 1.3 mg/dL (H)).  Radiologic Imaging: No results found.  Impression/Assessment:  1.  Prostate cancer, low-grade, on active surveillance  2.   History of nonmuscle invasive but high-grade bladder cancer with recent abnormal cystoscopy Plan:  Transrectal ultrasound and biopsy of the prostate, cystoscopy, bilateral retrograde ureteral pyelograms, bladder biopsies.

## 2021-02-15 NOTE — Anesthesia Procedure Notes (Signed)
Procedure Name: LMA Insertion Date/Time: 02/15/2021 8:10 AM Performed by: Bonney Aid, CRNA Pre-anesthesia Checklist: Patient identified, Emergency Drugs available, Suction available and Patient being monitored Patient Re-evaluated:Patient Re-evaluated prior to induction Oxygen Delivery Method: Circle system utilized Preoxygenation: Pre-oxygenation with 100% oxygen Induction Type: IV induction Ventilation: Mask ventilation without difficulty LMA: LMA inserted LMA Size: 5.0 Number of attempts: 1 Airway Equipment and Method: Bite block Placement Confirmation: positive ETCO2 Tube secured with: Tape Dental Injury: Teeth and Oropharynx as per pre-operative assessment

## 2021-02-15 NOTE — Anesthesia Postprocedure Evaluation (Signed)
Anesthesia Post Note  Patient: Adar Rase  Procedure(s) Performed: BIOPSY TRANSRECTAL ULTRASONIC PROSTATE (TUBP) (N/A Prostate) CYSTOSCOPY WITH BIOPSY (N/A Prostate) CYSTOSCOPY WITH RETROGRADE PYELOGRAM (Right Ureter)     Patient location during evaluation: PACU Anesthesia Type: General Level of consciousness: awake and alert Pain management: pain level controlled Vital Signs Assessment: post-procedure vital signs reviewed and stable Respiratory status: spontaneous breathing, nonlabored ventilation and respiratory function stable Cardiovascular status: blood pressure returned to baseline and stable Postop Assessment: no apparent nausea or vomiting Anesthetic complications: no   No complications documented.  Last Vitals:  Vitals:   02/15/21 0930 02/15/21 0945  BP: (!) 146/75 (!) 154/72  Pulse: (!) 57 (!) 55  Resp: (!) 9 10  Temp:    SpO2: 95% 94%    Last Pain:  Vitals:   02/15/21 0945  TempSrc:   PainSc: 0-No pain                 Lidia Collum

## 2021-02-15 NOTE — Transfer of Care (Signed)
Immediate Anesthesia Transfer of Care Note  Patient: Timothy [name]  Procedure(s) Performed: BIOPSY TRANSRECTAL ULTRASONIC PROSTATE (TUBP) (N/A Prostate) CYSTOSCOPY WITH BIOPSY (N/A Prostate) CYSTOSCOPY WITH RETROGRADE PYELOGRAM (Right Ureter)  Patient Location: PACU  Anesthesia Type:General  Level of Consciousness: drowsy  Airway & Oxygen Therapy: Patient Spontanous Breathing and Patient connected to nasal cannula oxygen  Post-op Assessment: Report given to RN  Post vital signs: Reviewed and stable  Last Vitals:  Vitals Value Taken Time  BP    Temp    Pulse 65 02/15/21 0902  Resp 16 02/15/21 0902  SpO2 100 % 02/15/21 0902  Vitals shown include unvalidated device data.  Last Pain:  Vitals:   02/15/21 0647  TempSrc: Oral  PainSc: 0-No pain      Patients Stated Pain Goal: 3 (58/09/98 3382)  Complications: No complications documented.

## 2021-02-15 NOTE — Op Note (Signed)
Preop diagnosis: History of nonmuscle invasive bladder cancer, adenocarcinoma of the prostate, low-grade/low risk  Postoperative diagnosis: Same  Principal procedure: Transrectal ultrasound of the prostate, ultrasound-guided biopsy of the prostate, prostate biopsies, cystoscopy, right retrograde ureteropyelogram, fluoroscopic interpretation, attempted left retrograde ureteropyelogram, bladder biopsies  Surgeon: Anarely Nicholls  Anesthesia: General with LMA  Complications: None, but unable to cannulate left ureteral orifice  Specimen: 12 needle core biopsies of the prostate, bladder biopsies  Drains:18 [place] Foley    Indications: 75 year old male presents for repeat ultrasound and biopsy of the prostate for active surveillance of prostate cancer, low-grade/risk as well as bladder biopsy and retrograde pyelograms for history of nonmuscle invasive, high-grade bladder cancer.  Most recent cystoscopy in November, 2021 revealed 2 erythematous areas in his anterior bladder.  Cytologies were normal, however.  Findings: Urethra was normal, without stricture/lesions.  Prostate minimally obstructive, without lesions.  Left ureteral orifice was quite difficult to see, was superior facing, right ureteral orifice was normal.  2-3 mildly erythematous areas, mainly in the posterior bladder wall and one in the bladder neck at the 6 o'clock position.  There were no papillary or nodular lesions of the bladder.  Retrograde study of the right ureter revealed a narrow ureter throughout, no filling defects, hydronephrosis, and pyelocalyceal system was normal.  Retrograde study of the left ureter was not performed.  Description of procedure: Patient was properly identified in the holding area.  He received preoperative antibiotics and was taken to the operating room where general anesthetic was administered with the LMA.  He was placed in the left lateral decubitus position.  Extremities were padded appropriately.   Proper  time-out was performed.     Transrectal ultrasound probe was introduced in the rectum.  Serial scanning of the prostate was then performed.  Prostate volume  Was measured at 73.9 mL.  12 needle core biopsies were taken as follows:  Right base lateral, right base medial, right mid lateral, right mid medial, right apex lateral, right apex medial, left base lateral, left base medial, left mid lateral, left mid medial, left apex lateral, left apex medial.  These were all sent for permanent specimen in 12 separate biopsy containers.  At this point, the probe was removed.  The patient was placed in the dorsal lithotomy position. 3 French panendoscope was advanced under direct vision into his bladder where circumferential/systematic inspection was performed.  Right retrograde ureteral pyelogram was performed with Omnipaque with the above-mentioned findings.  Despite using an angle tip guidewire and the open-ended catheter, I was unable to cannulate the left ureteral orifice despite multiple / extensive tries.  At this point, the biopsy forceps were placed.  Biopsies were taken from the small erythematous areas in the posterior midline as well as the midline bladder neck posteriorly.  These were sent labeled "bladder biopsies and ".  The biopsy sites were then cauterized.  There was adequate hemostasis.  37 French Foley catheter was in place.  The patients procedure was then terminated.  He is awakened, taken the PACU in stabl E condition having tolerated the procedure well.

## 2021-02-15 NOTE — Discharge Instructions (Signed)
1. You may see some blood in the urine and may have some burning with urination for 48-72 hours. You also may notice that you have to urinate more frequently or urgently after your procedure which is normal.  2. You should call should you develop an inability urinate, fever > 101, persistent nausea and vomiting that prevents you from eating or drinking to stay hydrated.  3. If you have a catheter, you will be taught how to take care of the catheter by the nursing staff prior to discharge from the hospital.  You may periodically feel a strong urge to void with the catheter in place.  This is a bladder spasm and most often can occur when having a bowel movement or moving around. It is typically self-limited and usually will stop after a few minutes.  You may use some Vaseline or Neosporin around the tip of the catheter to reduce friction at the tip of the penis. You may also see some blood in the urine.  A very small amount of blood can make the urine look quite red.  As long as the catheter is draining well, there usually is not a problem.  However, if the catheter is not draining well and is bloody, you should call the office [phone]) to notify us.  If the urine is fairly clear, you can remove the catheter on Tuesday morning as instructed by the nursing staff.   Post Anesthesia Home Care Instructions  Activity: Get plenty of rest for the remainder of the day. A responsible individual must stay with you for 24 hours following the procedure.  For the next 24 hours, DO NOT: -Drive a car -Paediatric nurse -Drink alcoholic beverages -Take any medication unless instructed by your physician -Make any legal decisions or sign important papers.  Meals: Start with liquid foods such as gelatin or soup. Progress to regular foods as tolerated. Avoid greasy, spicy, heavy foods. If nausea and/or vomiting occur, drink only clear liquids until the nausea and/or vomiting subsides. Call your physician if  vomiting continues.  Special Instructions/Symptoms: Your throat may feel dry or sore from the anesthesia or the breathing tube placed in your throat during surgery. If this causes discomfort, gargle with warm salt water. The discomfort should disappear within 24 hours.   Transrectal Ultrasound-Guided Prostate Biopsy, Care After This sheet gives you information about how to care for yourself after your procedure. Your doctor may also give you more specific instructions. If you have problems or questions, contact your doctor. What can I expect after the procedure? After the procedure, it is common to have:  Pain and discomfort in your butt, especially while sitting.  Pink-colored pee (urine), due to small amounts of blood in the pee.  Burning while peeing (urinating).  Blood in your poop (stool).  Bleeding from your butt.  Blood in your semen. Follow these instructions at home: Medicines  Take over-the-counter and prescription medicines only as told by your doctor.  If you were prescribed antibiotic medicine, take it as told by your doctor. Do not stop taking the antibiotic even if you start to feel better. Activity  Do not drive for 24 hours if you were given a medicine to help you relax (sedative) during your procedure.  Return to your normal activities as told by your doctor. Ask your doctor what activities are safe for you.  Ask your doctor when it is okay for you to have sex.  Do not lift anything that is heavier than 10 lb (  4.5 kg), or the limit that you are told, until your doctor says that it is safe.   General instructions  Drink enough water to keep your pee pale yellow.  Watch your pee, poop, and semen for new bleeding or bleeding that gets worse.  Keep all follow-up visits as told by your doctor. This is important.   Contact a doctor if you:  Have blood clots in your pee or poop.  Notice that your pee smells bad or unusual.  Have very bad belly  pain.  Have trouble peeing.  Notice that your lower belly feels firm.  Have blood in your pee for more than 2 weeks after the procedure.  Have blood in your semen for more than 2 months after the procedure.  Have problems getting an erection.  Feel sick to your stomach (nauseous) or throw up (vomit).  Have new or worse bleeding in your pee, poop, or semen. Get help right away if you:  Have a fever or chills.  Have bright red pee.  Have very bad pain that does not get better with medicine.  Cannot pee. Summary  After this procedure, it is common to have pain and discomfort around your butt, especially while sitting.  You may have blood in your pee and poop.  It is common to have blood in your semen for 1-2 months.  If you were prescribed antibiotic medicine, take it as told by your doctor. Do not stop taking the antibiotic even if you start to feel better.  Get help right away if you have a fever or chills. This information is not intended to replace advice given to you by your health care provider. Make sure you discuss any questions you have with your health care provider. Document Revised: 10/05/2020 Document Reviewed: 08/06/2020 Elsevier Patient Education  2021 [employer] CARE INSTRUCTIONS  Activity: Rest for the remainder of the day.  Do not drive or operate equipment today.  You may resume normal activities in one to two days as instructed by your physician.   Meals: Drink plenty of liquids and eat light foods such as gelatin or soup this evening.  You may return to a normal meal plan tomorrow.  Return to Work: You may return to work in one to two days or as instructed by your physician.  Special Instructions / Symptoms: Call your physician if any of these symptoms occur:   -persistent or heavy bleeding  -bleeding which continues after first few urination  -large blood clots that are difficult to pass  -urine stream diminishes or stops  completely  -fever equal to or higher than 101 degrees Farenheit.  -cloudy urine with a strong, foul odor  -severe pain  Females should always wipe from front to back after elimination.  You may feel some burning pain when you urinate.  This should disappear with time.  Applying moist heat to the lower abdomen or a hot tub bath may help relieve the pain.   Indwelling Urinary Catheter Care, Adult An indwelling urinary catheter is a thin tube that is put into your bladder. The tube helps to drain pee (urine) out of your body. The tube goes in through your urethra. Your urethra is where pee comes out of your body. Your pee will come out through the catheter, then it will go into a bag (drainage bag). Take good care of your catheter so it will work well. How to wear your catheter and bag Supplies needed  Sticky  tape (adhesive tape) or a leg strap.  Alcohol wipe or soap and water (if you use tape).  A clean towel (if you use tape).  Large overnight bag.  Smaller bag (leg bag). Wearing your catheter Attach your catheter to your leg with tape or a leg strap.  Make sure the catheter is not pulled tight.  If a leg strap gets wet, take it off and put on a dry strap.  If you use tape to hold the bag on your leg: 1. Use an alcohol wipe or soap and water to wash your skin where the tape made it sticky before. 2. Use a clean towel to pat-dry that skin. 3. Use new tape to make the bag stay on your leg. Wearing your bags You should have been given a large overnight bag.  You may wear the overnight bag in the day or night.  Always have the overnight bag lower than your bladder.  Do not let the bag touch the floor.  Before you go to sleep, put a clean plastic bag in a wastebasket. Then hang the overnight bag inside the wastebasket. You should also have a smaller leg bag that fits under your clothes.  Always wear the leg bag below your knee.  Do not wear your leg bag at night. How to care  for your skin and catheter Supplies needed  A clean washcloth.  Water and mild soap.  A clean towel. Caring for your skin and catheter  Clean the skin around your catheter every day: 1. Wash your hands with soap and water. 2. Wet a clean washcloth in warm water and mild soap. 3. Clean the skin around your urethra.  If you are male:  Gently spread the folds of skin around your vagina (labia).  With the washcloth in your other hand, wipe the inner side of your labia on each side. Wipe from front to back.  If you are male:  Pull back any skin that covers the end of your penis (foreskin).  With the washcloth in your other hand, wipe your penis in small circles. Start wiping at the tip of your penis, then move away from the catheter.  Move the foreskin back in place, if needed. 4. With your free hand, hold the catheter close to where it goes into your body.  Keep holding the catheter during cleaning so it does not get pulled out. 5. With the washcloth in your other hand, clean the catheter.  Only wipe downward on the catheter.  Do not wipe upward toward your body. Doing this may push germs into your urethra and cause infection. 6. Use a clean towel to pat-dry the catheter and the skin around it. Make sure to wipe off all soap. 7. Wash your hands with soap and water.  Shower every day. Do not take baths.  Do not use cream, ointment, or lotion on the area where the catheter goes into your body, unless your doctor tells you to.  Do not use powders, sprays, or lotions on your genital area.  Check your skin around the catheter every day for signs of infection. Check for: ? Redness, swelling, or pain. ? Fluid or blood. ? Warmth. ? Pus or a bad smell.      How to empty the bag Supplies needed  Rubbing alcohol.  Gauze pad or cotton ball.  Tape or a leg strap. Emptying the bag Pour the pee out of your bag when it is ?- full, or at least  2-3 times a day. Do this for  your overnight bag and your leg bag. 1. Wash your hands with soap and water. 2. Separate (detach) the bag from your leg. 3. Hold the bag over the toilet or a clean pail. Keep the bag lower than your hips and bladder. This is so the pee (urine) does not go back into the tube. 4. Open the pour spout. It is at the bottom of the bag. 5. Empty the pee into the toilet or pail. Do not let the pour spout touch any surface. 6. Put rubbing alcohol on a gauze pad or cotton ball. 7. Use the gauze pad or cotton ball to clean the pour spout. 8. Close the pour spout. 9. Attach the bag to your leg with tape or a leg strap. 10. Wash your hands with soap and water. Follow instructions for cleaning the drainage bag:  From the product maker.  As told by your doctor. How to change the bag Supplies needed  Alcohol wipes.  A clean bag.  Tape or a leg strap. Changing the bag Replace your bag when it starts to leak, smell bad, or look dirty. 1. Wash your hands with soap and water. 2. Separate the dirty bag from your leg. 3. Pinch the catheter with your fingers so that pee does not spill out. 4. Separate the catheter tube from the bag tube where these tubes connect (at the connection valve). Do not let the tubes touch any surface. 5. Clean the end of the catheter tube with an alcohol wipe. Use a different alcohol wipe to clean the end of the bag tube. 6. Connect the catheter tube to the tube of the clean bag. 7. Attach the clean bag to your leg with tape or a leg strap. Do not make the bag tight on your leg. 8. Wash your hands with soap and water. General rules  Never pull on your catheter. Never try to take it out. Doing that can hurt you.  Always wash your hands before and after you touch your catheter or bag. Use a mild, fragrance-free soap. If you do not have soap and water, use hand sanitizer.  Always make sure there are no twists or bends (kinks) in the catheter tube.  Always make sure there  are no leaks in the catheter or bag.  Drink enough fluid to keep your pee pale yellow.  Do not take baths, swim, or use a hot tub.  If you are male, wipe from front to back after you poop (have a bowel movement).   Contact a doctor if:  Your pee is cloudy.  Your pee smells worse than usual.  Your catheter gets clogged.  Your catheter leaks.  Your bladder feels full. Get help right away if:  You have redness, swelling, or pain where the catheter goes into your body.  You have fluid, blood, pus, or a bad smell coming from the area where the catheter goes into your body.  Your skin feels warm where the catheter goes into your body.  You have a fever.  You have pain in your: ? Belly (abdomen). ? Legs. ? Lower back. ? Bladder.  You see blood in the catheter.  Your pee is pink or red.  You feel sick to your stomach (nauseous).  You throw up (vomit).  You have chills.  Your pee is not draining into the bag.  Your catheter gets pulled out. Summary  An indwelling urinary catheter is a thin tube that  is placed into the bladder to help drain pee (urine) out of the body.  The catheter is placed into the part of the body that drains pee from the bladder (urethra).  Taking good care of your catheter will keep it working properly and help prevent problems.  Always wash your hands before and after touching your catheter or bag.  Never pull on your catheter or try to take it out. This information is not intended to replace advice given to you by your health care provider. Make sure you discuss any questions you have with your health care provider. Document Revised: 03/15/2019 Document Reviewed: 07/07/2017 Elsevier Patient Education  [place].

## 2021-02-15 NOTE — Anesthesia Preprocedure Evaluation (Addendum)
Anesthesia Evaluation  Patient identified by MRN, date of birth, ID band Patient awake    Reviewed: Allergy & Precautions, NPO status , Patient's Chart, lab work & pertinent test results, reviewed documented beta blocker date and time   History of Anesthesia Complications Negative for: history of anesthetic complications  Airway Mallampati: II  TM Distance: >3 FB Neck ROM: Full    Dental  (+) Teeth Intact   Pulmonary neg pulmonary ROS, former smoker,    Pulmonary exam normal        Cardiovascular hypertension, Pt. on medications and Pt. on home beta blockers + CAD, + Past MI, + Cardiac Stents (2016), + Peripheral Vascular Disease and +CHF  Normal cardiovascular exam     Neuro/Psych negative neurological ROS  negative psych ROS   GI/Hepatic negative GI ROS, Neg liver ROS,   Endo/Other  diabetes, Type 2, Oral Hypoglycemic Agents  Renal/GU Renal InsufficiencyRenal disease   Bladder cancer, prostate cancer    Musculoskeletal negative musculoskeletal ROS (+)   Abdominal   Peds  Hematology negative hematology ROS (+)   Anesthesia Other Findings  Echo 2019:  - Left ventricle: Mid and basal inferior wall hypokinesis. The cavity size was mildly dilated. Wall thickness was normal. Systolic function was mildly to moderately reduced. The estimated ejection fraction was in the range of 40% to 45%.  - Aortic valve: There was trivial regurgitation. Valve area (VTI): 1.51 cm^2. Valve area (Vmax): 1.75 cm^2. Valve area (Vmean): 1.64 cm^2.  - Atrial septum: No defect or patent foramen ovale was identified.  - Pulmonary arteries: PA peak pressure: 37 mm Hg (S).   Reproductive/Obstetrics                            Anesthesia Physical Anesthesia Plan  ASA: III  Anesthesia Plan: General   Post-op Pain Management:    Induction: Intravenous  PONV Risk Score and Plan: 2 and Ondansetron, Dexamethasone,  Midazolam and Treatment may vary due to age or medical condition  Airway Management Planned: LMA  Additional Equipment: None  Intra-op Plan:   Post-operative Plan: Extubation in OR  Informed Consent: I have reviewed the patients History and Physical, chart, labs and discussed the procedure including the risks, benefits and alternatives for the proposed anesthesia with the patient or authorized representative who has indicated his/her understanding and acceptance.     Dental advisory given  Plan Discussed with:   Anesthesia Plan Comments:         Anesthesia Quick Evaluation

## 2021-02-16 ENCOUNTER — Encounter (HOSPITAL_BASED_OUTPATIENT_CLINIC_OR_DEPARTMENT_OTHER): Payer: Self-pay | Admitting: Urology

## 2021-02-16 LAB — SURGICAL PATHOLOGY

## 2021-03-03 DIAGNOSIS — I10 Essential (primary) hypertension: Secondary | ICD-10-CM | POA: Diagnosis not present

## 2021-03-03 DIAGNOSIS — E1129 Type 2 diabetes mellitus with other diabetic kidney complication: Secondary | ICD-10-CM | POA: Diagnosis not present

## 2021-04-03 DIAGNOSIS — I10 Essential (primary) hypertension: Secondary | ICD-10-CM | POA: Diagnosis not present

## 2021-04-03 DIAGNOSIS — E1129 Type 2 diabetes mellitus with other diabetic kidney complication: Secondary | ICD-10-CM | POA: Diagnosis not present

## 2021-04-08 ENCOUNTER — Other Ambulatory Visit: Payer: Self-pay | Admitting: Cardiology

## 2021-04-30 DIAGNOSIS — E6609 Other obesity due to excess calories: Secondary | ICD-10-CM | POA: Diagnosis not present

## 2021-04-30 DIAGNOSIS — I779 Disorder of arteries and arterioles, unspecified: Secondary | ICD-10-CM | POA: Diagnosis not present

## 2021-04-30 DIAGNOSIS — E1151 Type 2 diabetes mellitus with diabetic peripheral angiopathy without gangrene: Secondary | ICD-10-CM | POA: Diagnosis not present

## 2021-04-30 DIAGNOSIS — Z6834 Body mass index (BMI) 34.0-34.9, adult: Secondary | ICD-10-CM | POA: Diagnosis not present

## 2021-04-30 DIAGNOSIS — I70213 Atherosclerosis of native arteries of extremities with intermittent claudication, bilateral legs: Secondary | ICD-10-CM | POA: Diagnosis not present

## 2021-05-04 ENCOUNTER — Other Ambulatory Visit: Payer: Self-pay

## 2021-05-04 DIAGNOSIS — I739 Peripheral vascular disease, unspecified: Secondary | ICD-10-CM

## 2021-05-04 DIAGNOSIS — I10 Essential (primary) hypertension: Secondary | ICD-10-CM | POA: Diagnosis not present

## 2021-05-04 DIAGNOSIS — E1129 Type 2 diabetes mellitus with other diabetic kidney complication: Secondary | ICD-10-CM | POA: Diagnosis not present

## 2021-05-07 ENCOUNTER — Encounter: Payer: Self-pay | Admitting: Physician Assistant

## 2021-05-07 ENCOUNTER — Ambulatory Visit (INDEPENDENT_AMBULATORY_CARE_PROVIDER_SITE_OTHER)
Admission: RE | Admit: 2021-05-07 | Discharge: 2021-05-07 | Disposition: A | Discharge status: Home / Self Care | Payer: Medicare HMO | Source: Ambulatory Visit | Attending: Vascular Surgery | Admitting: Vascular Surgery

## 2021-05-07 ENCOUNTER — Other Ambulatory Visit: Payer: Self-pay

## 2021-05-07 ENCOUNTER — Ambulatory Visit: Payer: Medicare HMO | Admitting: Physician Assistant

## 2021-05-07 ENCOUNTER — Ambulatory Visit (HOSPITAL_COMMUNITY)
Admission: RE | Admit: 2021-05-07 | Discharge: 2021-05-07 | Disposition: A | Discharge status: Home / Self Care | Payer: Medicare HMO | Source: Ambulatory Visit | Attending: Vascular Surgery | Admitting: Vascular Surgery

## 2021-05-07 VITALS — BP 162/85 | HR 62 | Temp 98.0°F | Resp 20 | Ht 68.0 in | Wt 220.2 lb

## 2021-05-07 DIAGNOSIS — I739 Peripheral vascular disease, unspecified: Secondary | ICD-10-CM

## 2021-05-07 NOTE — Progress Notes (Signed)
Office Note     CC:  follow up Requesting Provider:  Redmond School, MD  HPI: Timothy [name] is a 75 y.o. (1946-03-30) male who presents for annual follow up of peripheral artery disease. He has history of angioplasty and stenting of his left SFA by Dr. Trula Slade for lifestyle limiting claudication. At his last visit a year ago he was without any claudication, rest pain or tissue loss. He was having some radiculopathy in low back with radiation to let lower extremity. His non invasive studies looked great so this was not thought to be vascular.  Today he reports occasional burning and tightness in his calf muscles on very prolonged ambulation approximately 1/4 mile. He says it improves with rest. He also is still having a lot of lower back discomfort with radiation down left leg. He says he saw orthopedics and had MRI and no significant abnormalities were found. He was offered and injection but he did not want that. Otherwise he denies any rest pain or tissue loss. Since his last visit he stopped his statin, Aspirin and Plavix.  The pt is not  a statin for cholesterol management.  The pt is not on a daily aspirin. Other AC: none The pt is on BB and Hydralazine for hypertension.   The pt is diabetic.  Tobacco hx:  Former, quit 1994  Past Medical History:  Diagnosis Date  . Arthritis   . Bladder cancer ([place])   . Blood clot in vein 2018   left  . CAD (coronary artery disease)    a. 06/2014: NSTEMI s/p PTCA of LCx, 60-80% residual in distal LAD, 70% prox small D1.   b. 02/06/15 NSTEMI  s/p successful PCI/DES to mid RCA  . Decreased GFR 45 % per 01-04-2021 labs  . Diabetes mellitus, type 2 ([place])   . Elevated PSA   . HLD (hyperlipidemia)   . HOH (hard of hearing)   . Hypertension   . NSTEMI (non-ST elevated myocardial infarction) ([place]) 2015  . Peripheral vascular disease ([place])   . Poor historian   . Wears hearing aid in right ear     Past Surgical History:  Procedure Laterality Date  .  ABDOMINAL AORTOGRAM W/LOWER EXTREMITY N/A 08/28/2018   Procedure: ABDOMINAL AORTOGRAM W/LOWER EXTREMITY;  Surgeon: Serafina Mitchell, MD;  Location: [place] [facility];  Service: Cardiovascular;  Laterality: N/A;  bilateral  . ABDOMINAL AORTOGRAM W/LOWER EXTREMITY N/A 07/09/2019   Procedure: ABDOMINAL AORTOGRAM W/LOWER EXTREMITY;  Surgeon: Serafina Mitchell, MD;  Location: [place] [facility];  Service: Cardiovascular;  Laterality: N/A;  unilateral Lt  . CARDIAC CATHETERIZATION  02/06/2015   Procedure: CORONARY STENT INTERVENTION;  Surgeon: [name], MD;  Location: [facility] CATH LAB;  Service: Cardiovascular;;  Mid RCA  . CARDIAC CATHETERIZATION N/A 11/11/2016   Procedure: Left Heart Cath and Coronary Angiography;  Surgeon: [name], MD;  Location: [place] [facility];  Service: Cardiovascular;  Laterality: N/A;  . CARDIAC CATHETERIZATION N/A 11/11/2016   Procedure: Coronary Balloon Angioplasty;  Surgeon: [name], MD;  Location: [place] [facility];  Service: Cardiovascular;  Laterality: N/A;  . COLONOSCOPY N/A 01/14/2019   Procedure: COLONOSCOPY;  Surgeon: Rogene Houston, MD;  [facility];  Service: Endoscopy;  Laterality: N/A;  730  . CORONARY ANGIOPLASTY  11/11/2016   Mid Cx to Dist Cx lesion, 100 %stenosed. This lesion was treated with balloon angioplasty with a 2.0 balloon  . CYSTOSCOPY W/ RETROGRADES N/A 05/05/2015  Procedure: CYSTOSCOPY WITH BILATERAL RETROGRADE PYELOGRAMS AND BLADDER BIOPSIES;  Surgeon: [name], MD;  [facility];  Service: Urology;  Laterality: N/A;  . CYSTOSCOPY W/ RETROGRADES Bilateral 07/31/2018   Procedure: CYSTOSCOPY WITH BILATERAL RETROGRADE PYELOGRAM;  Surgeon: [name], MD;  [facility]: [facility];  Service: Urology;  Laterality: Bilateral;  . CYSTOSCOPY W/ RETROGRADES Right 02/15/2021   Procedure: CYSTOSCOPY WITH RETROGRADE PYELOGRAM;  Surgeon: [name], MD;  Location: [facility];  Service:  Urology;  Laterality: Right;  . CYSTOSCOPY WITH BIOPSY N/A 07/31/2018   Procedure: CYSTOSCOPY WITH BIOPSY;  Surgeon: [name], MD;  [facility];  Service: Urology;  Laterality: N/A;  . CYSTOSCOPY WITH BIOPSY N/A 02/15/2021   Procedure: CYSTOSCOPY WITH BIOPSY;  Surgeon: [name], MD;  Location: [facility];  Service: Urology;  Laterality: N/A;  . LEFT HEART CATHETERIZATION WITH CORONARY ANGIOGRAM N/A 06/17/2014   Procedure: LEFT HEART CATHETERIZATION WITH CORONARY ANGIOGRAM;  Surgeon: [name], MD;  Location: [facility] CATH LAB;  Service: Cardiovascular;  Laterality: N/A;  . LEFT HEART CATHETERIZATION WITH CORONARY ANGIOGRAM N/A 02/06/2015   Procedure: LEFT HEART CATHETERIZATION WITH CORONARY ANGIOGRAM;  Surgeon: [name], MD;  Location: [facility] C[facility] LAB;  Service: Cardiovascular;  Laterality: N/A;  . PERIPHERAL VASCULAR ATHERECTOMY  08/28/2018   Procedure: PERIPHERAL VASCULAR ATHERECTOMY;  Surgeon: Serafina Mitchell, MD;  Location: [place] [facility];  Service: Cardiovascular;;  Prox lt.SFA, Distal SFA  . PERIPHERAL VASCULAR INTERVENTION  07/09/2019   Procedure: PERIPHERAL VASCULAR INTERVENTION;  Surgeon: Serafina Mitchell, MD;  Location: [place] [facility];  Service: Cardiovascular;;  Lt. SFA  . POLYPECTOMY  01/14/2019   Procedure: POLYPECTOMY;  Surgeon: Rogene Houston, MD;  [facility];  Service: Endoscopy;;  colon  . PROSTATE BIOPSY N/A 03/31/2014   Procedure: BIOPSY TRANSRECTAL ULTRASONIC PROSTATE (TUBP);  Surgeon: [name], MD;  Location: [facility];  Service: Urology;  Laterality: N/A;  . PROSTATE BIOPSY N/A 02/15/2021   Procedure: BIOPSY TRANSRECTAL ULTRASONIC PROSTATE (TUBP);  Surgeon: [name], MD;  Location: [facility];  Service: Urology;  Laterality: N/A;  22 MINS  . TRANSURETHRAL RESECTION OF BLADDER TUMOR N/A 03/14/2013   Procedure: TRANSURETHRAL RESECTION OF BLADDER TUMOR (TURBT);  Surgeon:  [name], MD;  Location: [facility];  Service: Urology;  Laterality: N/A;  1 HR WITH MITOMYCIN INSTILLATION   . TRANSURETHRAL RESECTION OF BLADDER TUMOR N/A 09/30/2013   Procedure: TRANSURETHRAL RESECTION OF BLADDER TUMOR (TURBT);  Surgeon: [name], MD;  Location: [facility];  Service: Urology;  Laterality: N/A;  . TRANSURETHRAL RESECTION OF BLADDER TUMOR N/A 03/31/2014   Procedure: TRANSURETHRAL RESECTION OF BLADDER TUMOR (TURBT);  Surgeon: [name], MD;  Location: [facility];  Service: Urology;  Laterality: N/A;  . TRANSURETHRAL RESECTION OF BLADDER TUMOR N/A 05/31/2015   Procedure: Cystoscopy with clott evacuation and fulgeration of bleeders, retrograde pylegram;  Surgeon: [name], MD;  Location: [facility];  Service: Urology;  Laterality: N/A;    Social History   Socioeconomic History  . Marital status: Married    Spouse name: Not on file  . Number of children: Not on file  . Years of education: Not on file  . Highest education level: Not on file  Occupational History  . Not on file  Tobacco Use  . Smoking status: Former Smoker    Packs/day: 1.00    Years: 30.00    Pack years: 30.00  Types: Cigarettes    Quit date: 03/11/1993    Years since quitting: 28.1  . Smokeless tobacco: Never Used  Vaping Use  . Vaping Use: Never used  Substance and Sexual Activity  . Alcohol use: No    Alcohol/week: 0.0 standard drinks  . Drug use: No  . Sexual activity: Not Currently  Other Topics Concern  . Not on file  Social History Narrative  . Not on file   Social Determinants of Health   Financial Resource Strain: Not on file  Food Insecurity: Not on file  Transportation Needs: Not on file  Physical Activity: Not on file  Stress: Not on file  Social Connections: Not on file  Intimate Partner Violence: Not on file    Family History  Problem Relation Age of Onset  . Cancer Child   . Asthma Mother      Current Outpatient Medications  Medication Sig Dispense Refill  . acetaminophen (TYLENOL) 500 MG tablet Take 500 mg by mouth every 6 (six) hours as needed (pain).    Marland Kitchen FARXIGA 10 MG TABS tablet Take 10 mg by mouth every morning.    Marland Kitchen glimepiride (AMARYL) 2 MG tablet 2 (two) times daily.    . hydrALAZINE (APRESOLINE) 50 MG tablet TAKE 1.5 TABLETS (75 MG TOTAL) BY MOUTH 3 (THREE) TIMES DAILY. 405 tablet 3  . metoprolol succinate (TOPROL-XL) 25 MG 24 hr tablet TAKE 3 TABLES TOTAL DAILY (Patient taking differently: 3 (three) times daily.) 270 tablet 3  . sitaGLIPtin (JANUVIA) 100 MG tablet Take 100 mg by mouth every morning. (1100)    . aspirin 81 MG chewable tablet Chew 1 tablet (81 mg total) by mouth daily. (Patient not taking: Reported on 05/07/2021)     No current facility-administered medications for this visit.    Allergies  Allergen Reactions  . Lipitor [Atorvastatin] Other (See Comments)    Myalgia  . Other Other (See Comments)    All Cholesterol Meds - causes muscle and joint pain  . Pravastatin Other (See Comments)    Myalgia     REVIEW OF SYSTEMS:  [X]  denotes positive finding, [ ]  denotes negative finding Cardiac  Comments:  Chest pain or chest pressure:    Shortness of breath upon exertion:    Short of breath when lying flat:    Irregular heart rhythm:        Vascular    Pain in calf, thigh, or hip brought on by ambulation:    Pain in feet at night that wakes you up from your sleep:     Blood clot in your veins:    Leg swelling:         Pulmonary    Oxygen at home:    Productive cough:     Wheezing:         Neurologic    Sudden weakness in arms or legs:     Sudden numbness in arms or legs:     Sudden onset of difficulty speaking or slurred speech:    Temporary loss of vision in one eye:     Problems with dizziness:         Gastrointestinal    Blood in stool:     Vomited blood:         Genitourinary    Burning when urinating:     Blood in urine:         Psychiatric    Major depression:         Hematologic  Bleeding problems:    Problems with blood clotting too easily:        Skin    Rashes or ulcers:        Constitutional    Fever or chills:      PHYSICAL EXAMINATION:  Vitals:   05/07/21 1102  BP: (!) 162/85  Pulse: 62  Resp: 20  Temp: 98 F (36.7 C)  TempSrc: Temporal  SpO2: 98%  Weight: 220 lb 3.2 oz (99.9 kg)  Height: 5\' 8"  (1.727 m)    General:  WDWN in NAD; vital signs documented above Gait: Normal HENT: WNL, normocephalic Pulmonary: normal non-labored breathing , without wheezing Cardiac: regular HR, without  Murmurs without carotid bruit Abdomen: obese, non distended Vascular Exam/Pulses:  Right Left  Radial 2+ (normal) 2+ (normal)  Femoral 2+ (normal) 2+ (normal)  Popliteal Not palpable Not palpable  DP 1+ (weak) 1+ (weak)  PT 1+ (weak) 2+ (normal)   Extremities: without ischemic changes, without Gangrene , without cellulitis; without open wounds;  Musculoskeletal: no muscle wasting or atrophy  Neurologic: A&O X 3;  No focal weakness or paresthesias are detected Psychiatric:  The pt has Normal affect.   Non-Invasive Vascular Imaging:   +-------+-----------+-----------+------------+------------+  ABI/TBIToday's ABIToday's TBIPrevious ABIPrevious TBI  +-------+-----------+-----------+------------+------------+  Right 1.06    0.77    1.04    0.73      +-------+-----------+-----------+------------+------------+  Left  1.03    0.74    0.94    0.7       +-------+-----------+-----------+------------+------------+   VAS [place] Lower Extremity Arterial Duplex:  Left: Patent SFA stent with no reststenosis seen.    ASSESSMENT/PLAN:: 75 y.o. male here for follow up for peripheral artery disease. He has history of angioplasty and stenting of his left SFA by Dr. Trula Slade for lifestyle limiting claudication. He presently has some mild claudication but this is  not lifestyle limiting. He otherwise does not have rest pain or tissue loss. He has not been taking his statin, plavix or Aspirin. I had discussions with him about these medications and I have encouraged him to at least resume taking an 81 mg Aspirin daily with his PAD and prior stent. He is agreeable to this. He otherwise does not need to resume Plavix. He would like to discuss the statin with his PCP.  - His duplex today shows patent left SFA stent. ABIs are normal and essentially unchanged from prior study - He will follow up sooner should he have new or worsening symptoms  - He will follow up in 18 months with repeat LLE arterial duplex and ABIs    Karoline Caldwell, PA-C [facility] [phone]  Clinic MD:   Donzetta Matters

## 2021-07-04 DIAGNOSIS — I10 Essential (primary) hypertension: Secondary | ICD-10-CM | POA: Diagnosis not present

## 2021-07-04 DIAGNOSIS — E1129 Type 2 diabetes mellitus with other diabetic kidney complication: Secondary | ICD-10-CM | POA: Diagnosis not present

## 2021-07-21 DIAGNOSIS — Z6834 Body mass index (BMI) 34.0-34.9, adult: Secondary | ICD-10-CM | POA: Diagnosis not present

## 2021-07-21 DIAGNOSIS — E6609 Other obesity due to excess calories: Secondary | ICD-10-CM | POA: Diagnosis not present

## 2021-07-21 DIAGNOSIS — R140 Abdominal distension (gaseous): Secondary | ICD-10-CM | POA: Diagnosis not present

## 2021-09-01 DIAGNOSIS — E119 Type 2 diabetes mellitus without complications: Secondary | ICD-10-CM | POA: Diagnosis not present

## 2021-10-14 ENCOUNTER — Other Ambulatory Visit: Payer: Self-pay | Admitting: Cardiology

## 2021-10-24 ENCOUNTER — Other Ambulatory Visit: Payer: Self-pay | Admitting: Urology

## 2021-10-24 DIAGNOSIS — C61 Malignant neoplasm of prostate: Secondary | ICD-10-CM

## 2022-03-17 ENCOUNTER — Other Ambulatory Visit: Payer: Self-pay | Admitting: Cardiology

## 2022-03-18 DIAGNOSIS — Z6834 Body mass index (BMI) 34.0-34.9, adult: Secondary | ICD-10-CM | POA: Diagnosis not present

## 2022-03-18 DIAGNOSIS — M79671 Pain in right foot: Secondary | ICD-10-CM | POA: Diagnosis not present

## 2022-03-18 DIAGNOSIS — E6609 Other obesity due to excess calories: Secondary | ICD-10-CM | POA: Diagnosis not present

## 2022-03-24 DIAGNOSIS — E1129 Type 2 diabetes mellitus with other diabetic kidney complication: Secondary | ICD-10-CM | POA: Diagnosis not present

## 2022-03-24 DIAGNOSIS — E782 Mixed hyperlipidemia: Secondary | ICD-10-CM | POA: Diagnosis not present

## 2022-03-24 DIAGNOSIS — I214 Non-ST elevation (NSTEMI) myocardial infarction: Secondary | ICD-10-CM | POA: Diagnosis not present

## 2022-03-24 DIAGNOSIS — E6609 Other obesity due to excess calories: Secondary | ICD-10-CM | POA: Diagnosis not present

## 2022-03-24 DIAGNOSIS — G729 Myopathy, unspecified: Secondary | ICD-10-CM | POA: Diagnosis not present

## 2022-03-24 DIAGNOSIS — I10 Essential (primary) hypertension: Secondary | ICD-10-CM | POA: Diagnosis not present

## 2022-03-24 DIAGNOSIS — C675 Malignant neoplasm of bladder neck: Secondary | ICD-10-CM | POA: Diagnosis not present

## 2022-03-24 DIAGNOSIS — I2510 Atherosclerotic heart disease of native coronary artery without angina pectoris: Secondary | ICD-10-CM | POA: Diagnosis not present

## 2022-03-24 DIAGNOSIS — E7849 Other hyperlipidemia: Secondary | ICD-10-CM | POA: Diagnosis not present

## 2022-03-24 DIAGNOSIS — I739 Peripheral vascular disease, unspecified: Secondary | ICD-10-CM | POA: Diagnosis not present

## 2022-04-11 NOTE — Progress Notes (Signed)
History of Present Illness:  ? ?Timothy [name] presents for the following issues ?  ?BPH:  ?  ?Timothy [name] is a 76 year-old male established patient who is here for follow up regarding further evaluation of BPH and lower urinary tract symptoms. ? ?Patient is currently treated with Alfuzosin for his symptoms. His symptoms have been stable over the last year.  ? ?He is on an alpha-blocker for BPH.  ? ?5.9.2023: Still on tamsulosin.  No real urinary complaints. ?  ?Bladder Cancer: ?  ?Initial TURBT on 4.10.2014. He had papillary, low-grade, NMIBC. He eventually was found to have recurrence within 6 months, and underwent repeat TURBT on 10.27.2014. Again, this was low-grade, NMIBC. Because of his recurrence in a short period of time, despite using mitomycin after his first and second treatments, he underwent BCG induction, which was completed in January, 2014. He did have a significant inflammatory response to the mitomycin.  ? ?In April, 2015 at routine follow-up, he was found to have 2 recurrences, one approximately 1.5 and 1 approximately 1.0 cm. He underwent TURBT on 4.27.2015. This revealed high-grade NMIBC. He completed a second BCG induction course in early August, 2015. He subsequently underwent cystoscopy, bilateral retrogrades, and bladder biopsy on 5.3.2016. Biopsies of the bladder were negative. Retrograde studies were normal. He has had significant dysuria during his treatments. He has completed 3 maintenance BCG treatments, the last of which was 11.29.2016.  ? ?Because of the patient's significant side effects from his BCG i.e. dysuria, bladder pain, we stopped maintenance therapy after his 11.29.2016 dose.  ? ?8.26.2019: Because of positive cytologies, he underwent cystoscopy, bilateral retrogrades/renal washings, bladder barbotage and bladder biopsy. Bladder washings were negative, barbotage specimen negative, biopsies revealed only benign/inflammatory change.  ? ?10.8.2019: No gross hematuria.  Dysuria from procedure has essentially resolved.  ? ?2.11.2020: Cysto--stable scars. Cytology negative.  ? ?8.18.2020: Cysto today. No recent gross hematuria or urinary complaints.  ? ?3.14.2022: Repeat anesthetic cystoscopy and biopsy revealed benign urothelial changes with chronic inflammation.  No carcinoma identified. ? ?5.9.2023: No gross hematuria.  First visit since his above biopsy. ?  ?Prostate Cancer: ?  ?He is participating in active surveillance.  ? ?Because of a rising PSA, he underwent a concurrent transrectal ultrasound and biopsy of his prostate on 4.27.2015. Prostatic volume was 53.55 cm. PSA was 9.13. PSA density 0.17. 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 That biopsy was left base lateral, and less than 5% of the core was involved. There was no granulomatous reaction noted. He underwent repeat/surveillance biopsy on 1.8.2015. Prostatic volume was measured at 53.5 mL. All 12 cores returned negative for adenocarcinoma, with 1 core in the right apex revealing chronic granulomatous inflammation.  ? ?2.11.2020: PSA 8.7  ?3.23.2021: PSA 6.2 ?11.2.2021: PSA 8.0 ? ?3.14.2022: Repeat ultrasound and biopsy of the prostate.  No adenocarcinoma found.  Most cores had focal inflammation and granulomas. ? ?5.9.2023: No recent PSA ? ? ?Past Medical History:  ?Diagnosis Date  ? Arthritis   ? Bladder cancer ([place])   ? Blood clot in vein 2018  ? left  ? CAD (coronary artery disease)   ? a. 06/2014: NSTEMI s/p PTCA of LCx, 60-80% residual in distal LAD, 70% prox small D1.   b. 02/06/15 NSTEMI  s/p successful PCI/DES to mid RCA  ? Decreased GFR 45 % per 01-04-2021 labs  ? Diabetes mellitus, type 2 ([place])   ? Elevated PSA   ? HLD (hyperlipidemia)   ? HOH (hard of hearing)   ?  Hypertension   ? NSTEMI (non-ST elevated myocardial infarction) ([place]) 2015  ? Peripheral vascular disease ([place])   ? Poor historian   ? Wears hearing aid in right ear   ? ? ?Past Surgical History:  ?Procedure Laterality Date  ? ABDOMINAL AORTOGRAM  W/LOWER EXTREMITY N/A 08/28/2018  ? Procedure: ABDOMINAL AORTOGRAM W/LOWER EXTREMITY;  Surgeon: Serafina Mitchell, MD;  Location: [place] [facility];  Service: Cardiovascular;  Laterality: N/A;  bilateral  ? ABDOMINAL AORTOGRAM W/LOWER EXTREMITY N/A 07/09/2019  ? Procedure: ABDOMINAL AORTOGRAM W/LOWER EXTREMITY;  Surgeon: Serafina Mitchell, MD;  Location: [place] [facility];  Service: Cardiovascular;  Laterality: N/A;  unilateral Lt  ? CARDIAC CATHETERIZATION  02/06/2015  ? Procedure: CORONARY STENT INTERVENTION;  Surgeon: [name], MD;  Location: [facility] CATH LAB;  Service: Cardiovascular;;  Mid RCA  ? CARDIAC CATHETERIZATION N/A 11/11/2016  ? Procedure: Left Heart Cath and Coronary Angiography;  Surgeon: [name], MD;  Location: [place] [facility];  Service: Cardiovascular;  Laterality: N/A;  ? CARDIAC CATHETERIZATION N/A 11/11/2016  ? Procedure: Coronary Balloon Angioplasty;  Surgeon: [name], MD;  Location: [place] [facility];  Service: Cardiovascular;  Laterality: N/A;  ? COLONOSCOPY N/A 01/14/2019  ? Procedure: COLONOSCOPY;  Surgeon: Rogene Houston, MD;  [facility];  Service: Endoscopy;  Laterality: N/A;  730  ? CORONARY ANGIOPLASTY  11/11/2016  ? Mid Cx to Dist Cx lesion, 100 %stenosed. This lesion was treated with balloon angioplasty with a 2.0 balloon  ? CYSTOSCOPY W/ RETROGRADES N/A 05/05/2015  ? Procedure: CYSTOSCOPY WITH BILATERAL RETROGRADE PYELOGRAMS AND BLADDER BIOPSIES;  Surgeon: [name], MD;  [facility];  Service: Urology;  Laterality: N/A;  ? CYSTOSCOPY W/ RETROGRADES Bilateral 07/31/2018  ? Procedure: CYSTOSCOPY WITH BILATERAL RETROGRADE PYELOGRAM;  Surgeon: [name], MD;  [facility];  Service: Urology;  Laterality: Bilateral;  ? CYSTOSCOPY W/ RETROGRADES Right 02/15/2021  ? Procedure: CYSTOSCOPY WITH RETROGRADE PYELOGRAM;  Surgeon: [name], MD;  Location: [facility];  Service: Urology;  Laterality: Right;  ?  CYSTOSCOPY WITH BIOPSY N/A 07/31/2018  ? Procedure: CYSTOSCOPY WITH BIOPSY;  Surgeon: [name], MD;  [facility];  Service: Urology;  Laterality: N/A;  ? CYSTOSCOPY WITH BIOPSY N/A 02/15/2021  ? Procedure: CYSTOSCOPY WITH BIOPSY;  Surgeon: [name], MD;  Location: [facility];  Service: Urology;  Laterality: N/A;  ? LEFT HEART CATHETERIZATION WITH CORONARY ANGIOGRAM N/A 06/17/2014  ? Procedure: LEFT HEART CATHETERIZATION WITH CORONARY ANGIOGRAM;  Surgeon: [name], MD;  Location: [facility] CATH LAB;  Service: Cardiovascular;  Laterality: N/A;  ? LEFT HEART CATHETERIZATION WITH CORONARY ANGIOGRAM N/A 02/06/2015  ? Procedure: LEFT HEART CATHETERIZATION WITH CORONARY ANGIOGRAM;  Surgeon: [name], MD;  Location: [facility] CATH LAB;  Service: Cardiovascular;  Laterality: N/A;  ? PERIPHERAL VASCULAR ATHERECTOMY  08/28/2018  ? Procedure: PERIPHERAL VASCULAR ATHERECTOMY;  Surgeon: Serafina Mitchell, MD;  Location: [place] [facility];  Service: Cardiovascular;;  Prox lt.SFA, Distal SFA  ? PERIPHERAL VASCULAR INTERVENTION  07/09/2019  ? Procedure: PERIPHERAL VASCULAR INTERVENTION;  Surgeon: Serafina Mitchell, MD;  Location: [place] [facility];  Service: Cardiovascular;;  Lt. SFA  ? POLYPECTOMY  01/14/2019  ? Procedure: POLYPECTOMY;  Surgeon: Rogene Houston, MD;  [facility];  Service: Endoscopy;;  colon  ? PROSTATE BIOPSY N/A 03/31/2014  ? Procedure: BIOPSY TRANSRECTAL ULTRASONIC PROSTATE (TUBP);  Surgeon: [name], MD;  Location: [facility];  Service: Urology;  Laterality: N/A;  ? PROSTATE BIOPSY N/A 02/15/2021  ? Procedure: BIOPSY TRANSRECTAL ULTRASONIC PROSTATE (TUBP);  Surgeon: [name], MD;  Location: [facility];  Service: Urology;  Laterality: N/A;  45 MINS  ? TRANSURETHRAL RESECTION OF BLADDER TUMOR N/A 03/14/2013  ? Procedure: TRANSURETHRAL RESECTION OF BLADDER TUMOR (TURBT);  Surgeon: [name], MD;  Location: [facility];  Service: Urology;  Laterality: N/A;  1 HR ?WITH MITOMYCIN INSTILLATION ?  ? TRANSURETHRAL RESECTION OF BLADDER TUMOR N/A 09/30/2013  ? Procedure: TRANSURETHRAL RESECTION OF BLADDER TUMOR (TURB

## 2022-04-12 ENCOUNTER — Ambulatory Visit: Payer: Medicare HMO | Admitting: Urology

## 2022-04-12 VITALS — BP 166/77 | HR 84

## 2022-04-12 DIAGNOSIS — C61 Malignant neoplasm of prostate: Secondary | ICD-10-CM

## 2022-04-12 DIAGNOSIS — C679 Malignant neoplasm of bladder, unspecified: Secondary | ICD-10-CM | POA: Diagnosis not present

## 2022-04-12 DIAGNOSIS — N401 Enlarged prostate with lower urinary tract symptoms: Secondary | ICD-10-CM

## 2022-04-12 DIAGNOSIS — R3916 Straining to void: Secondary | ICD-10-CM

## 2022-04-12 DIAGNOSIS — Z8551 Personal history of malignant neoplasm of bladder: Secondary | ICD-10-CM | POA: Diagnosis not present

## 2022-04-12 DIAGNOSIS — Z8546 Personal history of malignant neoplasm of prostate: Secondary | ICD-10-CM

## 2022-04-12 DIAGNOSIS — C678 Malignant neoplasm of overlapping sites of bladder: Secondary | ICD-10-CM

## 2022-04-12 DIAGNOSIS — R8289 Other abnormal findings on cytological and histological examination of urine: Secondary | ICD-10-CM | POA: Diagnosis not present

## 2022-04-13 LAB — URINALYSIS, ROUTINE W REFLEX MICROSCOPIC
Bilirubin, UA: NEGATIVE
Leukocytes,UA: NEGATIVE
Nitrite, UA: NEGATIVE
Protein,UA: NEGATIVE
Specific Gravity, UA: 1.02 (ref 1.005–1.030)
Urobilinogen, Ur: 1 mg/dL (ref 0.2–1.0)
pH, UA: 6 (ref 5.0–7.5)

## 2022-04-13 LAB — CYTOLOGY, URINE

## 2022-04-13 LAB — PSA: Prostate Specific Ag, Serum: 5.4 ng/mL — ABNORMAL HIGH (ref 0.0–4.0)

## 2022-04-13 LAB — MICROSCOPIC EXAMINATION: Bacteria, UA: NONE SEEN

## 2022-07-04 ENCOUNTER — Ambulatory Visit: Payer: Medicare HMO | Admitting: Cardiology

## 2022-07-04 ENCOUNTER — Encounter: Payer: Self-pay | Admitting: Cardiology

## 2022-07-04 ENCOUNTER — Encounter: Payer: Self-pay | Admitting: *Deleted

## 2022-07-04 VITALS — BP 134/70 | HR 58 | Ht 68.0 in | Wt 223.4 lb

## 2022-07-04 DIAGNOSIS — I10 Essential (primary) hypertension: Secondary | ICD-10-CM | POA: Diagnosis not present

## 2022-07-04 DIAGNOSIS — I5022 Chronic systolic (congestive) heart failure: Secondary | ICD-10-CM

## 2022-07-04 DIAGNOSIS — I255 Ischemic cardiomyopathy: Secondary | ICD-10-CM | POA: Diagnosis not present

## 2022-07-04 DIAGNOSIS — E782 Mixed hyperlipidemia: Secondary | ICD-10-CM

## 2022-07-04 DIAGNOSIS — I2510 Atherosclerotic heart disease of native coronary artery without angina pectoris: Secondary | ICD-10-CM

## 2022-07-04 NOTE — Progress Notes (Signed)
Clinical Summary [name] is a 76 y.o.male seen today for follow up of the following medical problems.   1. HTN  - norvasc that we started last visit caused foot pain, stopped and resolved.  - compliant withmeds   - last visit we increased hydralazine to 69m tid due to high bp's -compliant with meds    2. Hyperlipidemia - intolerant to statins, has been on zetia only. He reports muscle aches on zetia, now off.  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 Echo LVEF 50-55%, inferior hypokinesis, grade I diastolic dysfunction. - admit 11/2016 with NSTEMI. Cath as reported below. Received balloon angioplasty of mid to distal LCX, too small to be stented.  - echo 11/2016 LVEF 40-45%.       - last visit he was doing well without symptoms but was noted to have new inferior and lateral precordial TWIs, and lateral Qwaves - referred for stress test. Jan 2019 nuclear stress small moderate intensity partially reversible mid to apical anterior defect consistent with mild ischemia. LVEF 25% by nuclear, however echo 02/2018 showed LVEF stabel 40-45%.      - no chest pains, no SOB/DOE    4. PAD - 07/2018 ABIs right 1.12, left 0.8 - followed by vascular. Has had left SFA stent.   - Jan 2021 normal ABIs, normal left SFA stent.     5. History of bladder cancer - followed by urology, s/p recesection Notes mention low volume prostate cancer as well   AAA screen: neg [id number] Past Medical History:  Diagnosis Date   Arthritis    Bladder cancer ([place]    Blood clot in vein 2018   left   CAD (coronary artery disease)    a. 06/2014: NSTEMI s/p PTCA of LCx, 60-80% residual in distal LAD, 70% prox small D1.   b. 02/06/15 NSTEMI  s/p successful PCI/DES to mid RCA   Decreased GFR 45 % per 01-04-2021 labs   Diabetes mellitus, type 2 (HCC)    Elevated PSA    HLD (hyperlipidemia)    HOH (hard  of hearing)    Hypertension    NSTEMI (non-ST elevated myocardial infarction) ([facility] 2015   Peripheral vascular disease ([facility]    Poor historian    Wears hearing aid in right ear      Allergies  Allergen Reactions   Lipitor [Atorvastatin] Other (See Comments)    Myalgia   Other Other (See Comments)    All Cholesterol Meds - causes muscle and joint pain   Pravastatin Other (See Comments)    Myalgia     Current Outpatient Medications  Medication Sig Dispense Refill   acetaminophen (TYLENOL) 500 MG tablet Take 500 mg by mouth every 6 (six) hours as needed (pain).     aspirin 81 MG chewable tablet Chew 1 tablet (81 mg total) by mouth daily.     FARXIGA 10 MG TABS tablet Take 10 mg by mouth every morning.     glimepiride (AMARYL) 2 MG tablet 2 (two) times daily.     hydrALAZINE (APRESOLINE) 50 MG tablet TAKE 1.5 TABLETS (75 MG TOTAL) BY MOUTH 3 (THREE) TIMES DAILY. 405 tablet 1   metoprolol succinate (TOPROL-XL) 25 MG 24 hr tablet TAKE 3 TABLES TOTAL DAILY 270 tablet 3   sitaGLIPtin (JANUVIA) 100 MG tablet Take 100 mg by mouth every  morning. (1100)     tamsulosin (FLOMAX) 0.4 MG CAPS capsule TAKE 1 CAPSULE (0.4 MG TOTAL) BY MOUTH DAILY AFTER SUPPER. 90 capsule 3   No current facility-administered medications for this visit.     Past Surgical History:  Procedure Laterality Date   ABDOMINAL AORTOGRAM W/LOWER EXTREMITY N/A 08/28/2018   Procedure: ABDOMINAL AORTOGRAM W/LOWER EXTREMITY;  Surgeon: Serafina Mitchell, MD;  Location: [place] [facility];  Service: Cardiovascular;  Laterality: N/A;  bilateral   ABDOMINAL AORTOGRAM W/LOWER EXTREMITY N/A 07/09/2019   Procedure: ABDOMINAL AORTOGRAM W/LOWER EXTREMITY;  Surgeon: Serafina Mitchell, MD;  Location: [place] [facility];  Service: Cardiovascular;  Laterality: N/A;  unilateral Lt   CARDIAC CATHETERIZATION  02/06/2015   Procedure: CORONARY STENT INTERVENTION;  Surgeon: [name], MD;  Location: [facility] CATH LAB;  Service: Cardiovascular;;  Mid  RCA   CARDIAC CATHETERIZATION N/A 11/11/2016   Procedure: Left Heart Cath and Coronary Angiography;  Surgeon: [name], MD;  Location: [place] [facility];  Service: Cardiovascular;  Laterality: N/A;   CARDIAC CATHETERIZATION N/A 11/11/2016   Procedure: Coronary Balloon Angioplasty;  Surgeon: [name], MD;  Location: [place] [facility];  Service: Cardiovascular;  Laterality: N/A;   COLONOSCOPY N/A 01/14/2019   Procedure: COLONOSCOPY;  Surgeon: Rogene Houston, MD;  [facility];  Service: Endoscopy;  Laterality: N/A;  730   CORONARY ANGIOPLASTY  11/11/2016   Mid Cx to Dist Cx lesion, 100 %stenosed. This lesion was treated with balloon angioplasty with a 2.0 balloon   CYSTOSCOPY W/ RETROGRADES N/A 05/05/2015   Procedure: CYSTOSCOPY WITH BILATERAL RETROGRADE PYELOGRAMS AND BLADDER BIOPSIES;  Surgeon: [name], MD;  Location: [facility];  Service: Urology;  Laterality: N/A;   CYSTOSCOPY W/ RETROGRADES Bilateral 07/31/2018   Procedure: CYSTOSCOPY WITH BILATERAL RETROGRADE PYELOGRAM;  Surgeon: [name], MD;  Location: [facility];  Service: Urology;  Laterality: Bilateral;   CYSTOSCOPY W/ RETROGRADES Right 02/15/2021   Procedure: CYSTOSCOPY WITH RETROGRADE PYELOGRAM;  Surgeon: [name], MD;  Location[facility];  Service: Urology;  Laterality: Right;   CYSTOSCOPY WITH BIOPSY N/A 07/31/2018   Procedure: CYSTOSCOPY WITH BIOPSY;  Surgeon: [name], MD;  [facility];  Service: Urology;  Laterality: N/A;   CYSTOSCOPY WITH BIOPSY N/A 02/15/2021   Procedure: CYSTOSCOPY WITH BIOPSY;  Surgeon: [name], MD;  Location: [facility];  Service: Urology;  Laterality: N/A;   LEFT HEART CATHETERIZATION WITH CORONARY ANGIOGRAM N/A 06/17/2014   Procedure: LEFT HEART CATHETERIZATION WITH CORONARY ANGIOGRAM;  Surgeon: [name], MD;  Location: [facility] CATH LAB;  Service: Cardiovascular;  Laterality: N/A;   LEFT HEART  CATHETERIZATION WITH CORONARY ANGIOGRAM N/A 02/06/2015   Procedure: LEFT HEART CATHETERIZATION WITH CORONARY ANGIOGRAM;  Surgeon: [name], MD;  Location: [facility] CATH LAB;  Service: Cardiovascular;  Laterality: N/A;   PERIPHERAL VASCULAR ATHERECTOMY  08/28/2018   Procedure: PERIPHERAL VASCULAR ATHERECTOMY;  Surgeon: Serafina Mitchell, MD;  Location: [place] [facility];  Service: Cardiovascular;;  Prox lt.SFA, Distal SFA   PERIPHERAL VASCULAR INTERVENTION  07/09/2019   Procedure: PERIPHERAL VASCULAR INTERVENTION;  Surgeon: Serafina Mitchell, MD;  Location: [place] [facility];  Service: Cardiovascular;;  Lt. SFA   POLYPECTOMY  01/14/2019   Procedure: POLYPECTOMY;  Surgeon: Rogene Houston, MD;  [facility];  Service: Endoscopy;;  colon   PROSTATE BIOPSY N/A 03/31/2014   Procedure: BIOPSY TRANSRECTAL ULTRASONIC PROSTATE (TUBP);  Surgeon: [name], MD;  Location:  [place];  Service: Urology;  Laterality: N/A;   PROSTATE BIOPSY N/A 02/15/2021   Procedure: BIOPSY TRANSRECTAL ULTRASONIC PROSTATE (TUBP);  Surgeon: [name], MD;  Location: [facility];  Service: Urology;  Laterality: N/A;  49 MINS   TRANSURETHRAL RESECTION OF BLADDER TUMOR N/A 03/14/2013   Procedure: TRANSURETHRAL RESECTION OF BLADDER TUMOR (TURBT);  Surgeon: [name], MD;  Location: [facility];  Service: Urology;  Laterality: N/A;  1 HR WITH MITOMYCIN INSTILLATION    TRANSURETHRAL RESECTION OF BLADDER TUMOR N/A 09/30/2013   Procedure: TRANSURETHRAL RESECTION OF BLADDER TUMOR (TURBT);  Surgeon: [name], MD;  Location: [facility];  Service: Urology;  Laterality: N/A;   TRANSURETHRAL RESECTION OF BLADDER TUMOR N/A 03/31/2014   Procedure: TRANSURETHRAL RESECTION OF BLADDER TUMOR (TURBT);  Surgeon: [name], MD;  Location: [facility];  Service: Urology;  Laterality: N/A;   TRANSURETHRAL RESECTION OF BLADDER TUMOR  N/A 05/31/2015   Procedure: Cystoscopy with clott evacuation and fulgeration of bleeders, retrograde pylegram;  Surgeon: [name], MD;  Location: [facility];  Service: Urology;  Laterality: N/A;     Allergies  Allergen Reactions   Lipitor [Atorvastatin] Other (See Comments)    Myalgia   Other Other (See Comments)    All Cholesterol Meds - causes muscle and joint pain   Pravastatin Other (See Comments)    Myalgia      Family History  Problem Relation Age of Onset   Cancer Child    Asthma Mother      Social History Mr. Mucci reports that he quit smoking about 29 years ago. His smoking use included cigarettes. He has a 30.00 pack-year smoking history. He has never used smokeless tobacco. Mr. Noyce reports no history of alcohol use.   Review of Systems CONSTITUTIONAL: No weight loss, fever, chills, weakness or fatigue.  HEENT: Eyes: No visual loss, blurred vision, double vision or yellow sclerae.No hearing loss, sneezing, congestion, runny nose or sore throat.  SKIN: No rash or itching.  CARDIOVASCULAR: per hpi RESPIRATORY: No shortness of breath, cough or sputum.  GASTROINTESTINAL: No anorexia, nausea, vomiting or diarrhea. No abdominal pain or blood.  GENITOURINARY: No burning on urination, no polyuria NEUROLOGICAL: No headache, dizziness, syncope, paralysis, ataxia, numbness or tingling in the extremities. No change in bowel or bladder control.  MUSCULOSKELETAL: No muscle, back pain, joint pain or stiffness.  LYMPHATICS: No enlarged nodes. No history of splenectomy.  PSYCHIATRIC: No history of depression or anxiety.  ENDOCRINOLOGIC: No reports of sweating, cold or heat intolerance. No polyuria or polydipsia.  Marland Kitchen   Physical Examination Today's Vitals   07/04/22 0909  BP: 134/70  Pulse: (!) 58  SpO2: 97%  Weight: 223 lb 6.4 oz (101.3 kg)  Height: _0  (1.727 m)   Body mass index is 33.97 kg/m.  Gen: resting comfortably, no acute distress HEENT: no  scleral icterus, pupils equal round and reactive, no palptable cervical adenopathy,  CV: RRR, no m/r/g no jvd Resp: Clear to auscultation bilaterally GI: abdomen is soft, non-tender, non-distended, normal bowel sounds, no hepatosplenomegaly MSK: extremities are warm, no edema.  Skin: warm, no rash Neuro:  no focal deficits Psych: appropriate affect   Diagnostic Studies   06/2014 Cath FINDINGS:   Hemodynamics:   Central Aortic Pressure / Mean: 108/62/83 mmHg Left Ventricular Pressure / LVEDP: 107/7/17 mmHg   Left Ventriculography: EF: ~45% % Wall Motion: global Hypokinesis   Coronary Anatomy: Dominance: Right Left Main: Large caliber, long  trunk that trifurcates into the LAD, Circumflex & Ramus Intermedius. Angiographically normal. LAD: Begins as a large caliber vessel that tapers into a relatively small caliber (~1.5-2.0 mm) vessel distally.  It wraps the apex, perfusing the distal 1/3 of the infero-apex. The distal vessel has tandem 60&70-80% focal lesions prior to the apex. D1: Very small caliber bifurcating vessel with proximal ~70% stenosis.   Left Circumflex: Begins as a large caliber vessel that also tapers to a small to moderate caliber vessel in the AV Groove where it gives off a small caliber OM1 Tonea Leiphart and is 100% occluded just beyond that point.    Post-PTCA: beyond the occluded AV Groove Circumflex, there is a small to moderate caliber LPL Lylith Bebeau.   Ramus intermedius: Large caliber vessel that bifurcates in the mid-vessel into to small to moderate caliber branches that both reach the apex.    RCA: Large caliber, dominant vessel with diffuse ~20-40% lesions, but no obstructive disease.  It bifurcates distally in to a small caliber, short PDA and Right Posterior AV Groove Kyran Whittier (RPAV) branches.  RPAV gives off several very small banches.   After reviewing the initial angiography, the culprit lesion was thought to be the occluded distal-AV Groove circumflesx.  Preparation  were made to proceed with PTCA on this lesion.   Percutaneous Coronary Intervention:   Guide: 6 Fr   CLS 3.5  -- very difficult guide support, catheter moved & disengaged with patient movement Guidewire: BMW (advanced initially in to an atrial Loomis Anacker for support & balloon advanced into proximal Circumflex allowing for further advancmemt of the wire beyond the culprit lesion. Predilation Balloon #1: Emerge 1.5 mm x 12 mm; Used to assist with wire support to advance guidewire 6 Atm x 30 Sec, 8 Atm x 30 Sec Post inflation revealed a small to moderate follow-on circumflex with LPL Sharlette Jansma.    Due to the relatively small sized distal vessel, the decision was to perform PTCA only. Balloon #2: Euphora 2.0 mm x 12 mm;   8 Atm x 90 Sec x 2 with IC NTG - no recoil Final Diameter: ~2.0 mm   Post deployment angiography in multiple views, with and without guidewire in place revealed excellent stent deployment and lesion coverage.  There was no evidence of dissection or perforation.   POST-OPERATIVE DIAGNOSIS:   Severe 2 vessel disease in small caliber vessel - distal AV Groove Circumflex 100% and distal LAD ~60&80% lesions (1.5 mm vessel) Successful PTCA of the occluded AV Groove Circumflex with Stent-like result. Moderately reduced LVEF by LV Gram Normal LVEDP     06/2014 Echo Study Conclusions  - Left ventricle: The cavity size was normal. Wall thickness was   normal. Systolic function was normal. The estimated ejection   fraction was in the range of 50% to 55%. Inferior hypokinesis.   Doppler parameters are consistent with abnormal left ventricular   relaxation (grade 1 diastolic dysfunction). The E/e&' ratio is   15, suggesting borderline elevated LV filling pressure. - Aortic valve: Trileaflet. Sclerosis without stenosis. There was   no regurgitation. - Left atrium: The atrium was normal in size. - Tricuspid valve: There was mild regurgitation. - Pulmonary arteries: PA peak pressure: 33 mm  Hg (S).  Impressions:  - LVEF 50-55%, inferior hypokinesis, mild TR, top normal RVSP,   diastolic dysfunction with borderline elevated LV filling   pressure.     11/2016 echo Study Conclusions   - Left ventricle: The cavity size was normal. Wall thickness was  normal. Systolic function was mildly to moderately reduced. The   estimated ejection fraction was in the range of 40% to 45%. There   is akinesis of the basal-midinferior and inferoseptal myocardium.   Doppler parameters are consistent with abnormal left ventricular   relaxation (grade 1 diastolic dysfunction). - Aortic valve: Trileaflet; mildly thickened, mildly calcified   leaflets. There was trivial regurgitation.   Impressions:   - EF is mildly reduced when compared to prior.     11/2016 cath There is mild left ventricular systolic dysfunction. The left ventricular ejection fraction is 45-50% by visual estimate. Mid RCA-2 lesion, 25 %stenosed. Patent mid RCA stent. Ost 2nd Diag to 2nd Diag lesion, 80 %stenosed. Unchanged from prior. Dist LAD lesion, 60 %stenosed. Unchanged from prior. Mid Cx to Dist Cx lesion, 100 %stenosed. This lesion was treated with balloon angioplasty with a 2.0 balloon. Several inflations were performed. Post intervention, there is a 0% residual stenosis. The vessel was too small to be stented. There is also significant tortuosity in the proximal vessel. LV end diastolic pressure is mildly elevated.   Initially, Brilinta was given for antiplatelet therapy in the Cath Lab. Since he did not receive a stent, we will transition him to clopidogrel.  He needs aggressive secondary prevention including lipid-lowering therapy. He has been intolerant to statins in the past. Would need to consider PCS K9 inhibitor along with other secondary prevention. I anticipate he'll be here 2 days in the hospital. Echocardiogram would be reasonable to check ejection fraction and mitral apparatus.   LAD and diagonal  disease was not addressed since they appeared unchanged angiographically. If he has refractory angina, could address the LAD lesion at a later time.   Jan 2018 AAA [place] No AAA     Jan 2019 nuclear stress No diagnostic ST segment changes. Occasional PVCs noted throughout the study. Hypertensive response. Technically low risk Duke treadmill score 4.5. Blood pressure demonstrated a hypertensive response to exercise. Small, moderate intensity, partially reversible mid to apical anterior defect consistent with ischemia. This is a high risk study predominantly based on calculated LVEF. Consider echocardiogram for further confirmation. Nuclear stress EF: 25%.     02/2018 echo Study Conclusions   - Left ventricle: Mid and basal inferior wall hypokinesis. The   cavity size was mildly dilated. Wall thickness was normal.   Systolic function was mildly to moderately reduced. The estimated   ejection fraction was in the range of 40% to 45%. - Aortic valve: There was trivial regurgitation. Valve area (VTI):   1.51 cm^2. Valve area (Vmax): 1.75 cm^2. Valve area (Vmean): 1.64   cm^2. - Atrial septum: No defect or patent foramen ovale was identified. - Pulmonary arteries: PA peak pressure: 37 mm Hg (S).  Assessment and Plan  1. HTN - overall at goal, continue current meds   2. Hyperlipidemia - history of CAD and PAD - intolerant to statins and zetia.  - discussed again a referal to lipid clinic, will see if we can arrange for him.   3. CAD/ICM - no recent symptoms. Medical therapy limited by renal dysfunction and mixed compliance.  - repeat echo, may consider adjustements in medical therapy pending LVEF.  - EKG today shows SR, no acute ischemic changes   4. PAD - followed by vascular   Arnoldo Lenis, M.D.

## 2022-07-04 NOTE — Patient Instructions (Addendum)
Medication Instructions:  Continue all current medications.  Labwork: none  Testing/Procedures: Your physician has requested that you have an echocardiogram. Echocardiography is a painless test that uses sound waves to create images of your heart. It provides your doctor with information about the size and shape of your heart and how well your heart's chambers and valves are working. This procedure takes approximately one hour. There are no restrictions for this procedure. Office will contact with results via phone, letter or mychart.     Follow-Up: 6 months   Any Other Special Instructions Will Be Listed Below (If Applicable). You have been referred to:  Lipid Clinic    If you need a refill on your cardiac medications before your next appointment, please call your pharmacy.

## 2022-07-05 ENCOUNTER — Ambulatory Visit (INDEPENDENT_AMBULATORY_CARE_PROVIDER_SITE_OTHER): Payer: Medicare HMO

## 2022-07-05 DIAGNOSIS — I5022 Chronic systolic (congestive) heart failure: Secondary | ICD-10-CM

## 2022-07-05 LAB — ECHOCARDIOGRAM COMPLETE
AR max vel: 1.45 cm2
AV Area VTI: 1.53 cm2
AV Area mean vel: 1.56 cm2
AV Mean grad: 6.4 mmHg
AV Peak grad: 11.5 mmHg
AV Vena cont: 0.39 cm
Ao pk vel: 1.69 m/s
Area-P 1/2: 3.46 cm2
Calc EF: 41.2 %
MV M vel: 4.41 m/s
MV Peak grad: 77.9 mmHg
MV Vena cont: 0.15 cm
P 1/2 time: 890 msec
Radius: 0.3 cm
S' Lateral: 4.2 cm
Single Plane A2C EF: 41.5 %
Single Plane A4C EF: 42.1 %

## 2022-07-15 ENCOUNTER — Telehealth: Payer: Self-pay | Admitting: *Deleted

## 2022-07-15 NOTE — Telephone Encounter (Signed)
-----   Message from Arnoldo Lenis, MD sent at 07/11/2022  2:00 PM EDT ----- Echo shows heart function low normal to mildly decreased, slight improvement from prior test. We may consider some medicaiton adjustements at f/u. Can he see me back in 3 months  Zandra Abts MD

## 2022-07-15 NOTE — Telephone Encounter (Signed)
Laurine Blazer, LPN  07/01/9790  5:04 PM EDT Back to Top    Notified, copy to pcp.

## 2022-08-01 ENCOUNTER — Other Ambulatory Visit: Payer: Self-pay | Admitting: *Deleted

## 2022-08-01 ENCOUNTER — Encounter: Payer: Self-pay | Admitting: *Deleted

## 2022-08-01 NOTE — Patient Outreach (Signed)
  Care Coordination   Initial Visit Note   08/01/2022 Name: Tiago Humphrey MRN: [id number] DOB: 06-26-1946  Joyce Leckey is a 76 y.o. year old male who sees Redmond School, MD for primary care. I spoke with  Janan Halter by phone today.  What matters to the patients health and wellness today?  Has diabetes, state blood sugars are high sometimes (around 180-190s).  Discussed target before and after eating.  State he has AWV scheduled for September.  Reminded of pharmacist appointment on 8/31.    Goals Addressed               This Visit's Progress     Effective DM management (pt-stated)        Care Coordination Interventions: Provided education to patient about basic DM disease process Reviewed medications with patient and discussed importance of medication adherence Counseled on importance of regular laboratory monitoring as prescribed Discussed plans with patient for ongoing care management follow up and provided patient with direct contact information for care management team Reviewed scheduled/upcoming provider appointments including: Need for AWV and follow up A1C, most recent one was 7.7 in April 2023 Assessed social determinant of health barriers         SDOH assessments and interventions completed:  Yes  SDOH Interventions Today    Flowsheet Row Most Recent Value  SDOH Interventions   Food Insecurity Interventions Intervention Not Indicated  Housing Interventions Intervention Not Indicated  Transportation Interventions Intervention Not Indicated        Care Coordination Interventions Activated:  Yes  Care Coordination Interventions:  Yes, provided   Follow up plan: Follow up call scheduled for 9/29    Encounter Outcome:  Pt. Visit Completed   Valente David, RN, MSN, [facility] Care Management Coordinator [phone]

## 2022-08-01 NOTE — Patient Instructions (Signed)
Visit Information  Thank you for taking time to visit with me today. Please don't hesitate to contact me if I can be of assistance to you before our next scheduled telephone appointment.  Following are the goals we discussed today:  Schedule AWV with PCP office. Recheck A1C  Our next appointment is by telephone on 9/29  Please call the care guide team at [phone] if you need to cancel or reschedule your appointment.   Please call the Suicide and Crisis Lifeline: 988 call the [place] National Suicide Prevention Lifeline: 414-254-8549 or TTY: 570-401-4502 TTY 608 050 9073) to talk to a trained counselor call 1-800-273-TALK (toll free, 24 hour hotline) call the [facility]: [phone] call 911 if you are experiencing a Mental Health or [place] or need someone to talk to.  Patient verbalizes understanding of instructions and care plan provided today and agrees to view in [place]. Active MyChart status and patient understanding of how to access instructions and care plan via MyChart confirmed with patient.     The patient has been provided with contact information for the care management team and has been advised to call with any health related questions or concerns.   Valente David, RN, MSN, [place] [facility] Care Management Coordinator [phone]

## 2022-08-04 ENCOUNTER — Ambulatory Visit: Payer: Medicare HMO | Attending: Cardiology | Admitting: Pharmacist

## 2022-08-04 ENCOUNTER — Encounter: Payer: Self-pay | Admitting: Pharmacist

## 2022-08-04 DIAGNOSIS — I214 Non-ST elevation (NSTEMI) myocardial infarction: Secondary | ICD-10-CM

## 2022-08-04 DIAGNOSIS — I739 Peripheral vascular disease, unspecified: Secondary | ICD-10-CM

## 2022-08-04 DIAGNOSIS — E7800 Pure hypercholesterolemia, unspecified: Secondary | ICD-10-CM | POA: Diagnosis not present

## 2022-08-04 DIAGNOSIS — G720 Drug-induced myopathy: Secondary | ICD-10-CM | POA: Diagnosis not present

## 2022-08-04 DIAGNOSIS — T466X5A Adverse effect of antihyperlipidemic and antiarteriosclerotic drugs, initial encounter: Secondary | ICD-10-CM | POA: Diagnosis not present

## 2022-08-04 NOTE — Patient Instructions (Addendum)
It was nice seeing you this morning  We would like your LDL (bad cholesterol) to be less than 55  We would like to start you on a new medication you will inject once every 2 weeks  I will send in the application for you and the company will mail you the medication  Once you start the medication we will recheck your cholesterol in 2-3 months  Please call with any questions  Karren Cobble, PharmD, [place], [place], [place], [place] [employer] [employer], [place], [address] Phone: [phone], Fax: [phone]

## 2022-08-04 NOTE — Progress Notes (Signed)
Patient ID: Kentravious Lipford                 DOB: [birth year]                    MRN: [id number]      HPI: Connie Hilgert is a 76 y.o. male patient referred to lipid clinic by Dr Harl Bowie. PMH is significant for HTN, HLD, CAD, NSTEMI, T2DM, PAD, and bladder cancer. Patient is statin intolerant.  Patient presents today in good spirits. Has tried at least 4 statins and ezetimibe and all caused severe myalgias and joint pains. Is not clear why he is visiting today, thinks he is getting a cholesterol injection today.  Last LDL 138 at PCP.  Last NSTEMI in 2017. Reports leg and knee pain. Is followed at vascular clinic for PAD.  Current Medications:  N/A  Intolerances:  Atorvastatin Pravastatin Lovastatin Rosuvastatin Ezetimibe  Risk Factors:  PAD CAD T2DM  LDL goal: <55  Labs:  TC 239, HDL 33, LDL 138, Trigs 372 (03/25/22)  Past Medical History:  Diagnosis Date   Arthritis    Bladder cancer ([place])    Blood clot in vein 2018   left   CAD (coronary artery disease)    a. 06/2014: NSTEMI s/p PTCA of LCx, 60-80% residual in distal LAD, 70% prox small D1.   b. 02/06/15 NSTEMI  s/p successful PCI/DES to mid RCA   Decreased GFR 45 % per 01-04-2021 labs   Diabetes mellitus, type 2 (HCC)    Elevated PSA    HLD (hyperlipidemia)    HOH (hard of hearing)    Hypertension    NSTEMI (non-ST elevated myocardial infarction) ([place]) 2015   Peripheral vascular disease ([place])    Poor historian    Wears hearing aid in right ear     Current Outpatient Medications on File Prior to Visit  Medication Sig Dispense Refill   acetaminophen (TYLENOL) 500 MG tablet Take 500 mg by mouth every 6 (six) hours as needed (pain).     aspirin 81 MG chewable tablet Chew 1 tablet (81 mg total) by mouth daily.     FARXIGA 10 MG TABS tablet Take 10 mg by mouth every morning.     glimepiride (AMARYL) 2 MG tablet Take 2 mg by mouth 2 (two) times daily.     hydrALAZINE (APRESOLINE) 50 MG tablet TAKE 1.5 TABLETS (75 MG  TOTAL) BY MOUTH 3 (THREE) TIMES DAILY. 405 tablet 1   metoprolol succinate (TOPROL-XL) 25 MG 24 hr tablet TAKE 3 TABLES TOTAL DAILY 270 tablet 3   sitaGLIPtin (JANUVIA) 100 MG tablet Take 100 mg by mouth every morning. (1100) (Patient not taking: Reported on 07/04/2022)     tamsulosin (FLOMAX) 0.4 MG CAPS capsule TAKE 1 CAPSULE (0.4 MG TOTAL) BY MOUTH DAILY AFTER SUPPER. 90 capsule 3   TRADJENTA 5 MG TABS tablet Take 5 mg by mouth daily.     No current facility-administered medications on file prior to visit.    Allergies  Allergen Reactions   Lipitor [Atorvastatin] Other (See Comments)    Myalgia   Other Other (See Comments)    All Cholesterol Meds - causes muscle and joint pain   Pravastatin Other (See Comments)    Myalgia    Assessment/Plan:  1. Hyperlipidemia - Patient LDL 138 which is above goal of <55.  Aggressive goal selected due to history of NSTEMI, PAD and T2DM.  Confused regarding treatment options, thought he was coming to [facility] for injection.  Explained next options for patient since he is statin intolerant including PCSK9i and Leqvio. Iniitially hesistant to do self injections but after demonstrating use of demo pen, patient believes he will be able to self inject every 2 weeks and liked the idea of picking up medication from his home pharmacy and injecting at home rather than driving to [place]. However he does not think he can afford the copay. Discussed that [place] would likely be much higher. Had patient sign patient assistance paperwork and will submit to manufacturer. Plan likely prefers Praluent. Will contact patient once approved.  Start Praluent/Repatha sq q 14 days Recheck lipid panel in 2-3 months  Karren Cobble, PharmD, [place], [place], [address] [place], [place], [place], [address] Phone: [phone], Fax: [phone]

## 2022-08-22 DIAGNOSIS — Z0001 Encounter for general adult medical examination with abnormal findings: Secondary | ICD-10-CM | POA: Diagnosis not present

## 2022-08-22 DIAGNOSIS — E6609 Other obesity due to excess calories: Secondary | ICD-10-CM | POA: Diagnosis not present

## 2022-08-22 DIAGNOSIS — I10 Essential (primary) hypertension: Secondary | ICD-10-CM | POA: Diagnosis not present

## 2022-08-22 DIAGNOSIS — E782 Mixed hyperlipidemia: Secondary | ICD-10-CM | POA: Diagnosis not present

## 2022-08-22 DIAGNOSIS — E119 Type 2 diabetes mellitus without complications: Secondary | ICD-10-CM | POA: Diagnosis not present

## 2022-08-22 DIAGNOSIS — C675 Malignant neoplasm of bladder neck: Secondary | ICD-10-CM | POA: Diagnosis not present

## 2022-08-22 DIAGNOSIS — Z6834 Body mass index (BMI) 34.0-34.9, adult: Secondary | ICD-10-CM | POA: Diagnosis not present

## 2022-08-22 DIAGNOSIS — Z23 Encounter for immunization: Secondary | ICD-10-CM | POA: Diagnosis not present

## 2022-08-22 DIAGNOSIS — I2510 Atherosclerotic heart disease of native coronary artery without angina pectoris: Secondary | ICD-10-CM | POA: Diagnosis not present

## 2022-08-22 DIAGNOSIS — E1151 Type 2 diabetes mellitus with diabetic peripheral angiopathy without gangrene: Secondary | ICD-10-CM | POA: Diagnosis not present

## 2022-08-22 DIAGNOSIS — Z1331 Encounter for screening for depression: Secondary | ICD-10-CM | POA: Diagnosis not present

## 2022-08-23 LAB — BASIC METABOLIC PANEL
BUN: 17 (ref 4–21)
Creatinine: 1.5 — AB (ref 0.6–1.3)
Glucose: 186

## 2022-08-23 LAB — HEMOGLOBIN A1C: Hemoglobin A1C: 7.9

## 2022-08-23 LAB — COMPREHENSIVE METABOLIC PANEL: eGFR: 47

## 2022-08-23 LAB — LIPID PANEL
LDL Cholesterol: 115
Triglycerides: 372 — AB (ref 40–160)

## 2022-08-23 LAB — TSH: TSH: 1.17 (ref 0.41–5.90)

## 2022-09-02 ENCOUNTER — Ambulatory Visit: Payer: Self-pay | Admitting: *Deleted

## 2022-09-02 NOTE — Patient Outreach (Signed)
  Care Coordination   09/02/2022 Name: Timothy [name] MRN: [id number] DOB: May 06, 1946   Care Coordination Outreach Attempts:  An unsuccessful telephone outreach was attempted for a scheduled appointment today.  Follow Up Plan:  Additional outreach attempts will be made to offer the patient care coordination information and services.   Encounter Outcome:  No Answer  Care Coordination Interventions Activated:  No   Care Coordination Interventions:  No, not indicated    Valente David, RN, MSN, [facility] Care Management Care Management Coordinator [phone]

## 2022-09-05 DIAGNOSIS — E119 Type 2 diabetes mellitus without complications: Secondary | ICD-10-CM | POA: Diagnosis not present

## 2022-09-08 ENCOUNTER — Encounter: Payer: Self-pay | Admitting: Nurse Practitioner

## 2022-09-08 ENCOUNTER — Ambulatory Visit: Payer: Medicare HMO | Admitting: Nurse Practitioner

## 2022-09-08 VITALS — BP 152/73 | HR 71 | Ht 66.0 in | Wt 221.8 lb

## 2022-09-08 DIAGNOSIS — E1122 Type 2 diabetes mellitus with diabetic chronic kidney disease: Secondary | ICD-10-CM

## 2022-09-08 DIAGNOSIS — E782 Mixed hyperlipidemia: Secondary | ICD-10-CM

## 2022-09-08 DIAGNOSIS — N1831 Chronic kidney disease, stage 3a: Secondary | ICD-10-CM | POA: Diagnosis not present

## 2022-09-08 DIAGNOSIS — I10 Essential (primary) hypertension: Secondary | ICD-10-CM | POA: Diagnosis not present

## 2022-09-08 MED ORDER — GLIMEPIRIDE 2 MG PO TABS: 2.0000 mg | ORAL_TABLET | Freq: Two times a day (BID) | ORAL | 3 refills | Status: DC

## 2022-09-08 MED ORDER — TRADJENTA 5 MG PO TABS: 5.0000 mg | ORAL_TABLET | Freq: Every day | ORAL | 0 refills | Status: DC

## 2022-09-08 NOTE — Progress Notes (Signed)
Endocrinology Consult Note       09/08/2022, 4:06 PM   Subjective:    Patient ID: Timothy [name], male    DOB: March 02, 1946.  Timothy [name] is being seen in consultation for management of currently uncontrolled symptomatic diabetes requested by  Redmond School, MD.   Past Medical History:  Diagnosis Date   Arthritis    Bladder cancer ([place])    Blood clot in vein 2018   left   CAD (coronary artery disease)    a. 06/2014: NSTEMI s/p PTCA of LCx, 60-80% residual in distal LAD, 70% prox small D1.   b. 02/06/15 NSTEMI  s/p successful PCI/DES to mid RCA   Decreased GFR 45 % per 01-04-2021 labs   Diabetes mellitus, type 2 (HCC)    Elevated PSA    HLD (hyperlipidemia)    HOH (hard of hearing)    Hypertension    NSTEMI (non-ST elevated myocardial infarction) ([place]) 2015   Peripheral vascular disease ([place])    Poor historian    Wears hearing aid in right ear     Past Surgical History:  Procedure Laterality Date   ABDOMINAL AORTOGRAM W/LOWER EXTREMITY N/A 08/28/2018   Procedure: ABDOMINAL AORTOGRAM W/LOWER EXTREMITY;  Surgeon: Serafina Mitchell, MD;  Location: [place] [facility];  Service: Cardiovascular;  Laterality: N/A;  bilateral   ABDOMINAL AORTOGRAM W/LOWER EXTREMITY N/A 07/09/2019   Procedure: ABDOMINAL AORTOGRAM W/LOWER EXTREMITY;  Surgeon: Serafina Mitchell, MD;  Location: [place] [facility];  Service: Cardiovascular;  Laterality: N/A;  unilateral Lt   CARDIAC CATHETERIZATION  02/06/2015   Procedure: CORONARY STENT INTERVENTION;  Surgeon: [name], MD;  Location: [facility] CATH LAB;  Service: Cardiovascular;;  Mid RCA   CARDIAC CATHETERIZATION N/A 11/11/2016   Procedure: Left Heart Cath and Coronary Angiography;  Surgeon: [name], MD;  Location: [place] [facility];  Service: Cardiovascular;  Laterality: N/A;   CARDIAC CATHETERIZATION N/A 11/11/2016   Procedure: Coronary Balloon Angioplasty;  Surgeon:  [name], MD;  Location: [place] [facility];  Service: Cardiovascular;  Laterality: N/A;   COLONOSCOPY N/A 01/14/2019   Procedure: COLONOSCOPY;  Surgeon: Rogene Houston, MD;  [facility];  Service: Endoscopy;  Laterality: N/A;  730   CORONARY ANGIOPLASTY  11/11/2016   Mid Cx to Dist Cx lesion, 100 %stenosed. This lesion was treated with balloon angioplasty with a 2.0 balloon   CYSTOSCOPY W/ RETROGRADES N/A 05/05/2015   Procedure: CYSTOSCOPY WITH BILATERAL RETROGRADE PYELOGRAMS AND BLADDER BIOPSIES;  Surgeon: [name], MD;  Location: [facility];  Service: Urology;  Laterality: N/A;   CYSTOSCOPY W/ RETROGRADES Bilateral 07/31/2018   Procedure: CYSTOSCOPY WITH BILATERAL RETROGRADE PYELOGRAM;  Surgeon: [name], MD;  Location: [facility];  Service: Urology;  Laterality: Bilateral;   CYSTOSCOPY W/ RETROGRADES Right 02/15/2021   Procedure: CYSTOSCOPY WITH RETROGRADE PYELOGRAM;  Surgeon: [name], MD;  Location: [facility];  Service: Urology;  Laterality: Right;   CYSTOSCOPY WITH BIOPSY N/A 07/31/2018   Procedure: CYSTOSCOPY WITH BIOPSY;  Surgeon: [name], MD;  Location: [facility];  Service: Urology;  Laterality: N/A;   CYSTOSCOPY WITH BIOPSY N/A 02/15/2021   Procedure: CYSTOSCOPY WITH BIOPSY;  Surgeon: [name], MD;  Location[facility];  Service: Urology;  Laterality: N/A;   LEFT HEART CATHETERIZATION WITH CORONARY ANGIOGRAM N/A 06/17/2014   Procedure: LEFT HEART CATHETERIZATION WITH CORONARY ANGIOGRAM;  Surgeon: [name], MD;  Location: [facility] CATH LAB;  Service: Cardiovascular;  Laterality: N/A;   LEFT HEART CATHETERIZATION WITH CORONARY ANGIOGRAM N/A 02/06/2015   Procedure: LEFT HEART CATHETERIZATION WITH CORONARY ANGIOGRAM;  Surgeon: [name], MD;  Location: [facility] CATH LAB;  Service: Cardiovascular;  Laterality: N/A;   PERIPHERAL VASCULAR ATHERECTOMY  08/28/2018   Procedure: PERIPHERAL VASCULAR  ATHERECTOMY;  Surgeon: Serafina Mitchell, MD;  Location: [place] [facility];  Service: Cardiovascular;;  Prox lt.SFA, Distal SFA   PERIPHERAL VASCULAR INTERVENTION  07/09/2019   Procedure: PERIPHERAL VASCULAR INTERVENTION;  Surgeon: Serafina Mitchell, MD;  Location: [place] [facility];  Service: Cardiovascular;;  Lt. SFA   POLYPECTOMY  01/14/2019   Procedure: POLYPECTOMY;  Surgeon: Rogene Houston, MD;  [facility];  Service: Endoscopy;;  colon   PROSTATE BIOPSY N/A 03/31/2014   Procedure: BIOPSY TRANSRECTAL ULTRASONIC PROSTATE (TUBP);  Surgeon: [name], MD;  Location: [facility];  Service: Urology;  Laterality: N/A;   PROSTATE BIOPSY N/A 02/15/2021   Procedure: BIOPSY TRANSRECTAL ULTRASONIC PROSTATE (TUBP);  Surgeon: [name], MD;  Location: [facility];  Service: Urology;  Laterality: N/A;  79 MINS   TRANSURETHRAL RESECTION OF BLADDER TUMOR N/A 03/14/2013   Procedure: TRANSURETHRAL RESECTION OF BLADDER TUMOR (TURBT);  Surgeon: [name], MD;  Location: [facility];  Service: Urology;  Laterality: N/A;  1 HR WITH MITOMYCIN INSTILLATION    TRANSURETHRAL RESECTION OF BLADDER TUMOR N/A 09/30/2013   Procedure: TRANSURETHRAL RESECTION OF BLADDER TUMOR (TURBT);  Surgeon: [name], MD;  Location: [facility];  Service: Urology;  Laterality: N/A;   TRANSURETHRAL RESECTION OF BLADDER TUMOR N/A 03/31/2014   Procedure: TRANSURETHRAL RESECTION OF BLADDER TUMOR (TURBT);  Surgeon: [name], MD;  Location: [facility];  Service: Urology;  Laterality: N/A;   TRANSURETHRAL RESECTION OF BLADDER TUMOR N/A 05/31/2015   Procedure: Cystoscopy with clott evacuation and fulgeration of bleeders, retrograde pylegram;  Surgeon: [name], MD;  Location: [facility];  Service: Urology;  Laterality: N/A;    Social History   Socioeconomic History   Marital status: Married    Spouse name: Not on  file   Number of children: Not on file   Years of education: Not on file   Highest education level: Not on file  Occupational History   Not on file  Tobacco Use   Smoking status: Former    Packs/day: 1.00    Years: 30.00    Total pack years: 30.00    Types: Cigarettes    Quit date: 03/11/1993    Years since quitting: 29.5   Smokeless tobacco: Never  Vaping Use   Vaping Use: Never used  Substance and Sexual Activity   Alcohol use: No    Alcohol/week: 0.0 standard drinks of alcohol   Drug use: No   Sexual activity: Not Currently  Other Topics Concern   Not on file  Social History Narrative   Not on file   Social Determinants of Health   Financial Resource Strain: Not on file  Food Insecurity: No Food Insecurity (08/01/2022)   Hunger Vital Sign    Worried About Running Out of Food in the Last Year: Never true    Ran Out of Food  in the Last Year: Never true  Transportation Needs: No Transportation Needs (08/01/2022)   PRAPARE - Hydrologist (Medical): No    Lack of Transportation (Non-Medical): No  Physical Activity: Not on file  Stress: Not on file  Social Connections: Not on file    Family History  Problem Relation Age of Onset   Cancer Child    Asthma Mother     Outpatient Encounter Medications as of 09/08/2022  Medication Sig   acetaminophen (TYLENOL) 500 MG tablet Take 500 mg by mouth every 6 (six) hours as needed (pain).   aspirin 81 MG chewable tablet Chew 1 tablet (81 mg total) by mouth daily.   hydrALAZINE (APRESOLINE) 50 MG tablet TAKE 1.5 TABLETS (75 MG TOTAL) BY MOUTH 3 (THREE) TIMES DAILY.   metoprolol succinate (TOPROL-XL) 25 MG 24 hr tablet TAKE 3 TABLES TOTAL DAILY   [DISCONTINUED] glimepiride (AMARYL) 2 MG tablet Take 2 mg by mouth 2 (two) times daily.   [DISCONTINUED] TRADJENTA 5 MG TABS tablet Take 5 mg by mouth daily.   glimepiride (AMARYL) 2 MG tablet Take 1 tablet (2 mg total) by mouth 2 (two) times daily.   tamsulosin  (FLOMAX) 0.4 MG CAPS capsule TAKE 1 CAPSULE (0.4 MG TOTAL) BY MOUTH DAILY AFTER SUPPER. (Patient not taking: Reported on 09/08/2022)   TRADJENTA 5 MG TABS tablet Take 1 tablet (5 mg total) by mouth daily.   [DISCONTINUED] FARXIGA 10 MG TABS tablet Take 10 mg by mouth every morning. (Patient not taking: Reported on 09/08/2022)   No facility-administered encounter medications on file as of 09/08/2022.    ALLERGIES: Allergies  Allergen Reactions   Lipitor [Atorvastatin] Other (See Comments)    Myalgia   Other Other (See Comments)    All Cholesterol Meds - causes muscle and joint pain   Pravastatin Other (See Comments)    Myalgia    VACCINATION STATUS: Immunization History  Administered Date(s) Administered   Influenza, High Dose Seasonal PF 08/29/2017, 08/16/2018   Influenza-Unspecified 09/08/2014    Diabetes He presents for his initial diabetic visit. He has type 2 diabetes mellitus. Onset time: diagnosed at approx age of 40. His disease course has been fluctuating. Hypoglycemia symptoms include nervousness/anxiousness, sweats and tremors. There are no diabetic associated symptoms. There are no hypoglycemic complications. Diabetic complications include nephropathy. Risk factors for coronary artery disease include diabetes mellitus, dyslipidemia, family history, male sex, obesity, hypertension and sedentary lifestyle. Current diabetic treatment includes oral agent (dual therapy) (Tradjenta 5 mg and Glimepiride 2 mg po BID). His weight is increasing steadily. He is following a generally unhealthy diet. When asked about meal planning, he reported none. He has not had a previous visit with a dietitian. He rarely participates in exercise. (He presents today for his consultation with no meter or logs to review.  His most recent A1c on 9/19 was 7.9%.  He monitors glucose 1-2 times daily.  He drinks mostly lemonade, coffee with cream and sugar, and 1 soda per day.  He eats 2 meals per day, skipping  lunch on some days, and admits to late night snacking.  He does not engage in routine physical activity.  He is UTD on eye exam, has been seen by podiatry in the past.) An ACE inhibitor/angiotensin II receptor blocker is not being taken. He sees a podiatrist.Eye exam is current.     Review of systems  Constitutional: + steadily increasing body weight, current Body mass index is 35.8 kg/m., no fatigue, no  subjective hyperthermia, no subjective hypothermia Eyes: no blurry vision, no xerophthalmia ENT: no sore throat, no nodules palpated in throat, no dysphagia/odynophagia, no hoarseness Cardiovascular: no chest pain, no shortness of breath, no palpitations, no leg swelling Respiratory: no cough, no shortness of breath Gastrointestinal: no nausea/vomiting/diarrhea Musculoskeletal: no muscle/joint aches Skin: no rashes, no hyperemia Neurological: no tremors, no numbness, no tingling, no dizziness Psychiatric: no depression, no anxiety  Objective:     BP (!) 152/73 (BP Location: Right Arm, Patient Position: Sitting, Cuff Size: Large)   Pulse 71   Ht '5\' 6"'$  (1.676 m)   Wt 221 lb 12.8 oz (100.6 kg)   BMI 35.80 kg/m   Wt Readings from Last 3 Encounters:  09/08/22 221 lb 12.8 oz (100.6 kg)  07/04/22 223 lb 6.4 oz (101.3 kg)  05/07/21 220 lb 3.2 oz (99.9 kg)     BP Readings from Last 3 Encounters:  09/08/22 (!) 152/73  07/04/22 134/70  04/12/22 (!) 166/77     Physical Exam- Limited  Constitutional:  Body mass index is 35.8 kg/m. , not in acute distress, normal state of mind, HOH wears bilateral hearing aids Eyes:  EOMI, no exophthalmos Neck: Supple Cardiovascular: RRR, no murmurs, rubs, or gallops, no edema Respiratory: Adequate breathing efforts, no crackles, rales, rhonchi, or wheezing Musculoskeletal: no gross deformities, strength intact in all four extremities, no gross restriction of joint movements Skin:  no rashes, no hyperemia Neurological: no tremor with outstretched  hands  Diabetic Foot Exam - Simple   No data filed     CMP ( most recent) CMP     Component Value Date/Time   NA 140 02/15/2021 0701   K 4.3 02/15/2021 0701   CL 102 02/15/2021 0701   CO2 25 01/04/2021 0953   GLUCOSE 193 (H) 02/15/2021 0701   BUN 17 08/23/2022 0000   CREATININE 1.5 (A) 08/23/2022 0000   CREATININE 1.30 (H) 02/15/2021 0701   CREATININE 1.43 (H) 02/25/2020 1148   CALCIUM 9.2 01/04/2021 0953   PROT 7.2 11/10/2016 2351   ALBUMIN 4.2 11/10/2016 2351   AST 32 11/10/2016 2351   ALT 20 11/10/2016 2351   ALKPHOS 47 11/10/2016 2351   BILITOT 0.5 11/10/2016 2351   GFRNONAA 45 (L) 01/04/2021 0953   GFRAA 47 (L) 07/09/2019 0801     Diabetic Labs (most recent): Lab Results  Component Value Date   HGBA1C 7.9 08/23/2022   HGBA1C 7.2 (H) 07/30/2018   HGBA1C 6.3 (H) 11/11/2016     Lipid Panel ( most recent) Lipid Panel     Component Value Date/Time   CHOL 138 11/12/2016 0101   TRIG 372 (A) 08/23/2022 0000   HDL 25 (L) 11/12/2016 0101   CHOLHDL 5.5 11/12/2016 0101   VLDL 39 11/12/2016 0101   LDLCALC 115 08/23/2022 0000      Lab Results  Component Value Date   TSH 1.17 08/23/2022   TSH 1.805 02/06/2015           Assessment & Plan:   1) Type 2 diabetes mellitus with stage 3a chronic kidney disease, without long-term current use of insulin ([place])  He presents today for his consultation with no meter or logs to review.  His most recent A1c on 9/19 was 7.9%.  He monitors glucose 1-2 times daily.  He drinks mostly lemonade, coffee with cream and sugar, and 1 soda per day.  He eats 2 meals per day, skipping lunch on some days, and admits to late night snacking.  He does  not engage in routine physical activity.  He is UTD on eye exam, has been seen by podiatry in the past.  - Rebekah Sprinkle has currently uncontrolled symptomatic type 2 DM since 76 years of age, with most recent A1c of 7.9 %.   -Recent labs reviewed.  - I had a long discussion with him  about the progressive nature of diabetes and the pathology behind its complications. -his diabetes is complicated by mild CKD and he remains at a high risk for more acute and chronic complications which include CAD, CVA, CKD, retinopathy, and neuropathy. These are all discussed in detail with him.  The following Lifestyle Medicine recommendations according to [facility]) were discussed and offered to patient and he agrees to start the journey:  A. Whole Foods, Plant-based plate comprising of fruits and vegetables, plant-based proteins, whole-grain carbohydrates was discussed in detail with the patient.   A list for source of those nutrients were also provided to the patient.  Patient will use only water or unsweetened tea for hydration. B.  The need to stay away from risky substances including alcohol, smoking; obtaining 7 to 9 hours of restorative sleep, at least 150 minutes of moderate intensity exercise weekly, the importance of healthy social connections,  and stress reduction techniques were discussed. C.  A full color page of  Calorie density of various food groups per pound showing examples of each food groups was provided to the patient.  - I have counseled him on diet and weight management by adopting a carbohydrate restricted/protein rich diet. Patient is encouraged to switch to unprocessed or minimally processed complex starch and increased protein intake (animal or plant source), fruits, and vegetables. -  he is advised to stick to a routine mealtimes to eat 3 meals a day and avoid unnecessary snacks (to snack only to correct hypoglycemia).   - he acknowledges that there is a room for improvement in his food and drink choices. - Suggestion is made for him to avoid simple carbohydrates from his diet including Cakes, Sweet Desserts, Ice Cream, Soda (diet and regular), Sweet Tea, Candies, Chips, Cookies, Store Bought Juices, Alcohol in Excess of 1-2 drinks a  day, Artificial Sweeteners, Coffee Creamer, and "Sugar-free" Products. This will help patient to have more stable blood glucose profile and potentially avoid unintended weight gain.  - I have approached him with the following individualized plan to manage his diabetes and patient agrees:   -he is encouraged to start monitoring glucose twice daily, before breakfast and before bed, to log their readings on the clinic sheets provided, and bring them to review at follow up appointment in 4 weeks.  - Adjustment parameters are given to him for hypo and hyperglycemia in writing. - he is encouraged to call clinic for blood glucose levels less than 70 or above 300 mg /dl. - he is advised to continue Glimepiride 2 mg po twice daily with meals, therapeutically suitable for patient .  I did continue his Tradjenta 5 mg po daily today but may look at different option at next visit due to high cost.  - Specific targets for  A1c; LDL, HDL, and Triglycerides were discussed with the patient.  2) Blood Pressure /Hypertension:  his blood pressure is controlled to target.   he is advised to continue his current medications including Hydralazine 75 mg p.o. TID and Metoprolol 25 mg po daily.  3) Lipids/Hyperlipidemia:    Review of his recent lipid panel from 08/23/22 showed uncontrolled  LDL at 115 and elevated triglycerides of 372.  He is statin intolerant says his PCP was going to start him on Repatha but has not heard about it yet.  4)  Weight/Diet:  his Body mass index is 35.8 kg/m.  -  clearly complicating his diabetes care.   he is a candidate for weight loss. I discussed with him the fact that loss of 5 - 10% of his  current body weight will have the most impact on his diabetes management.  Exercise, and detailed carbohydrates information provided  -  detailed on discharge instructions.  5) Chronic Care/Health Maintenance: -he is not on ACEI/ARB and is intolerant to Statin medications and is encouraged to  initiate and continue to follow up with Ophthalmology, Dentist, Podiatrist at least yearly or according to recommendations, and advised to stay away from smoking. I have recommended yearly flu vaccine and pneumonia vaccine at least every 5 years; moderate intensity exercise for up to 150 minutes weekly; and sleep for at least 7 hours a day.  - he is advised to maintain close follow up with Redmond School, MD for primary care needs, as well as his other providers for optimal and coordinated care.   - Time spent in this patient care: 60 min, of which > 50% was spent in counseling him about his diabetes and the rest reviewing his blood glucose logs, discussing his hypoglycemia and hyperglycemia episodes, reviewing his current and previous labs/studies (including abstraction from other facilities) and medications doses and developing a long term treatment plan based on the latest standards of care/guidelines; and documenting his care.    Please refer to Patient Instructions for Blood Glucose Monitoring and Insulin/Medications Dosing Guide" in media tab for additional information. Please also refer to "Patient Self Inventory" in the Media tab for reviewed elements of pertinent patient history.  Janan Halter participated in the discussions, expressed understanding, and voiced agreement with the above plans.  All questions were answered to his satisfaction. he is encouraged to contact clinic should he have any questions or concerns prior to his return visit.     Follow up plan: - Return in about 1 month (around 10/09/2022) for Diabetes F/U, Bring meter and logs, No previsit labs.    Rayetta Pigg, [facility] [address] [place],  [address] Phone: [phone] Fax: [phone]  09/08/2022, 4:06 PM

## 2022-09-23 ENCOUNTER — Telehealth: Payer: Self-pay | Admitting: *Deleted

## 2022-09-23 NOTE — Chronic Care Management (AMB) (Signed)
  Care Coordination Note  09/23/2022 Name: [name] MRN: [id number] DOB: 1946-09-06  Timothy [name] is a 76 y.o. year old male who is a primary care patient of [employer]mond [employer], MD and is actively engaged with the care management team. I reached out to Janan Halter by phone today to assist with re-scheduling a follow up visit with the RN Case Manager  Follow up plan: Unsuccessful telephone outreach attempt made. A HIPAA compliant phone message was left for the patient providing contact information and requesting a return call.   [place]  Direct Dial: [phone]

## 2022-09-26 NOTE — Chronic Care Management (AMB) (Signed)
  Care Coordination Note  09/26/2022 Name: [name] MRN: [id number] DOB: 23-Jul-1946  Timothy [name] is a 76 y.o. year old male who is a primary care patient of Redmond [employer], MD and is actively engaged with the care management team. I reached out to Janan Halter by phone today to assist with re-scheduling a follow up visit with the RN Case Manager  Follow up plan: Unsuccessful telephone outreach attempt made. A HIPAA compliant phone message was left for the patient providing contact information and requesting a return call.    [place]  Direct Dial: [phone]

## 2022-09-30 NOTE — Chronic Care Management (AMB) (Signed)
  Care Coordination Note  09/30/2022 Name: Izreal Kock MRN: [id number] DOB: 03/03/1946  Charlis Harner is a 76 y.o. year old male who is a primary care patient of Redmond [employer], MD and is actively engaged with the care management team. I reached out to Janan Halter by phone today to assist with re-scheduling a follow up visit with the RN Case Manager  Follow up plan: Telephone appointment with care management team member scheduled for: 10/05/22  [place]  Direct Dial: [phone]

## 2022-10-05 ENCOUNTER — Ambulatory Visit: Payer: Self-pay | Admitting: *Deleted

## 2022-10-05 ENCOUNTER — Other Ambulatory Visit: Payer: Self-pay | Admitting: Nurse Practitioner

## 2022-10-05 NOTE — Patient Outreach (Signed)
  Care Coordination   10/05/2022 Name: Ayron Fillinger MRN: [id number] DOB: 08/05/1946   Care Coordination Outreach Attempts:  An unsuccessful telephone outreach was attempted for a scheduled appointment today.  Follow Up Plan:  Additional outreach attempts will be made to offer the patient care coordination information and services.   Encounter Outcome:  No Answer  Care Coordination Interventions Activated:  No   Care Coordination Interventions:  No, not indicated    Chong Sicilian, BSN, RN-BC RN Care Coordinator [place]: [phone] Main #: [phone]

## 2022-10-07 ENCOUNTER — Encounter: Payer: Self-pay | Admitting: Student

## 2022-10-07 ENCOUNTER — Ambulatory Visit: Payer: Medicare HMO | Attending: Student | Admitting: Student

## 2022-10-07 VITALS — BP 144/78 | HR 78 | Ht 68.0 in | Wt 220.0 lb

## 2022-10-07 DIAGNOSIS — I5022 Chronic systolic (congestive) heart failure: Secondary | ICD-10-CM

## 2022-10-07 DIAGNOSIS — N1831 Chronic kidney disease, stage 3a: Secondary | ICD-10-CM | POA: Diagnosis not present

## 2022-10-07 DIAGNOSIS — I10 Essential (primary) hypertension: Secondary | ICD-10-CM | POA: Diagnosis not present

## 2022-10-07 DIAGNOSIS — I739 Peripheral vascular disease, unspecified: Secondary | ICD-10-CM | POA: Diagnosis not present

## 2022-10-07 DIAGNOSIS — I2510 Atherosclerotic heart disease of native coronary artery without angina pectoris: Secondary | ICD-10-CM

## 2022-10-07 DIAGNOSIS — E782 Mixed hyperlipidemia: Secondary | ICD-10-CM

## 2022-10-07 MED ORDER — LOSARTAN POTASSIUM 25 MG PO TABS: 25.0000 mg | ORAL_TABLET | Freq: Every day | ORAL | 3 refills | Status: DC

## 2022-10-07 MED ORDER — HYDRALAZINE HCL 50 MG PO TABS: 50.0000 mg | ORAL_TABLET | Freq: Two times a day (BID) | ORAL | 3 refills | Status: DC

## 2022-10-07 NOTE — Progress Notes (Unsigned)
Cardiology Office Note    Date:  10/08/2022   ID:  Timothy [name], DOB 05-20-1946, MRN [id number]  PCP:  Redmond School, MD  Cardiologist: Carlyle Dolly, MD    Chief Complaint  Patient presents with   Follow-up    3 month visit    History of Present Illness:    Timothy [name] is a 76 y.o. 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   He was examined by Dr. Harl Bowie in 06/2022 and Hydralazine had recently been increased to 50 mg TID due to elevated BP and readings had improved. He denied any recent anginal symptoms. Medical therapy for his cardiomyopathy had been limited by renal dysfunction and mixed medication compliance, therefore a follow-up echocardiogram was recommended for reassessment of his EF. This was performed in 07/2022 and showed his EF was still mildly reduced at 45 to 50% with no regional wall motion abnormalities. RV function was normal and he did have mild MR and mild AI but no significant valve abnormalities. Closer follow-up was recommended to see if medical therapy could be further adjusted  He did meet with the [facility]D [facility] in 07/2022 for consideration of PCSK9 inhibitor therapy and patient assistance paperwork was submitted.  In talking with the patient today, he reports having random episodes of diaphoresis and feels like this is possibly secondary to his medications. Thinks symptoms worsened after Hydralazine was titrated and he would like to make changes to see if this improves. Concerned about cost of medications as he reports [name] is poorly covered by his insurance. He denies any recent chest pain or dyspnea on exertion. No recent orthopnea, PND or  pitting edema.   Past Medical History:  Diagnosis Date   Arthritis    Bladder cancer ([place])    Blood clot in vein 2018   left   CAD (coronary artery disease)    a. 06/2014: NSTEMI s/p PTCA of LCx, 60-80% residual in distal LAD, 70% prox small D1.   b. 02/06/15 NSTEMI  s/p successful PCI/DES to mid RCA c. angioplasty alone to LCx in 11/2016   Decreased GFR 45 % per 01-04-2021 labs   Diabetes mellitus, type 2 (HCC)    Elevated PSA    HLD (hyperlipidemia)    HOH (hard of hearing)    Hypertension    NSTEMI (non-ST elevated myocardial infarction) ([place]) 2015   Peripheral vascular disease ([place])    Poor historian    Wears hearing aid in right ear     Past Surgical History:  Procedure Laterality Date   ABDOMINAL AORTOGRAM W/LOWER EXTREMITY N/A 08/28/2018   Procedure: ABDOMINAL AORTOGRAM W/LOWER EXTREMITY;  Surgeon: Serafina Mitchell, MD;  Location: [place] [facility];  Service: Cardiovascular;  Laterality: N/A;  bilateral   ABDOMINAL AORTOGRAM W/LOWER EXTREMITY N/A 07/09/2019   Procedure: ABDOMINAL AORTOGRAM W/LOWER EXTREMITY;  Surgeon: Serafina Mitchell, MD;  Location: [place] [facility];  Service: Cardiovascular;  Laterality: N/A;  unilateral Lt   CARDIAC CATHETERIZATION  02/06/2015   Procedure: CORONARY STENT INTERVENTION;  Surgeon: [name], MD;  Location: [facility] LAB;  Service: Cardiovascular;;  Mid RCA   CARDIAC CATHETERIZATION N/A 11/11/2016  Procedure: Left Heart Cath and Coronary Angiography;  Surgeon: [name], MD;  Location: [place] [facility];  Service: Cardiovascular;  Laterality: N/A;   CARDIAC CATHETERIZATION N/A 11/11/2016   Procedure: Coronary Balloon Angioplasty;  Surgeon: [name], MD;  Location: [place] [facility];  Service: Cardiovascular;  Laterality: N/A;   COLONOSCOPY N/A 01/14/2019   Procedure: COLONOSCOPY;  Surgeon: Rogene Houston, MD;  [facility];  Service: Endoscopy;  Laterality: N/A;  730   CORONARY ANGIOPLASTY  11/11/2016   Mid  Cx to Dist Cx lesion, 100 %stenosed. This lesion was treated with balloon angioplasty with a 2.0 balloon   CYSTOSCOPY W/ RETROGRADES N/A 05/05/2015   Procedure: CYSTOSCOPY WITH BILATERAL RETROGRADE PYELOGRAMS AND BLADDER BIOPSIES;  Surgeon: [name], MD;  Location: [facility];  Service: Urology;  Laterality: N/A;   CYSTOSCOPY W/ RETROGRADES Bilateral 07/31/2018   Procedure: CYSTOSCOPY WITH BILATERAL RETROGRADE PYELOGRAM;  Surgeon: [name], MD;  Location: [facility];  Service: Urology;  Laterality: Bilateral;   CYSTOSCOPY W/ RETROGRADES Right 02/15/2021   Procedure: CYSTOSCOPY WITH RETROGRADE PYELOGRAM;  Surgeon: [name], MD;  Location: [facility];  Service: Urology;  Laterality: Right;   CYSTOSCOPY WITH BIOPSY N/A 07/31/2018   Procedure: CYSTOSCOPY WITH BIOPSY;  Surgeon: [name], MD;  [facility];  Service: Urology;  Laterality: N/A;   CYSTOSCOPY WITH BIOPSY N/A 02/15/2021   Procedure: CYSTOSCOPY WITH BIOPSY;  Surgeon: [name], MD;  Location: [facility];  Service: Urology;  Laterality: N/A;   LEFT HEART CATHETERIZATION WITH CORONARY ANGIOGRAM N/A 06/17/2014   Procedure: LEFT HEART CATHETERIZATION WITH CORONARY ANGIOGRAM;  Surgeon: [name], MD;  Location: [facility] CATH LAB;  Service: Cardiovascular;  Laterality: N/A;   LEFT HEART CATHETERIZATION WITH CORONARY ANGIOGRAM N/A 02/06/2015   Procedure: LEFT HEART CATHETERIZATION WITH CORONARY ANGIOGRAM;  Surgeon: [name], MD;  Location: [facility] CATH LAB;  Service: Cardiovascular;  Laterality: N/A;   PERIPHERAL VASCULAR ATHERECTOMY  08/28/2018   Procedure: PERIPHERAL VASCULAR ATHERECTOMY;  Surgeon: Serafina Mitchell, MD;  Location: [place] [facility];  Service: Cardiovascular;;  Prox lt.SFA, Distal SFA   PERIPHERAL VASCULAR INTERVENTION  07/09/2019   Procedure: PERIPHERAL VASCULAR INTERVENTION;  Surgeon: Serafina Mitchell, MD;  Location: [place] [facility];  Service: Cardiovascular;;   Lt. SFA   POLYPECTOMY  01/14/2019   Procedure: POLYPECTOMY;  Surgeon: Rogene Houston, MD;  [facility];  Service: Endoscopy;;  colon   PROSTATE BIOPSY N/A 03/31/2014   Procedure: BIOPSY TRANSRECTAL ULTRASONIC PROSTATE (TUBP);  Surgeon: [name], MD;  Location: [facility];  Service: Urology;  Laterality: N/A;   PROSTATE BIOPSY N/A 02/15/2021   Procedure: BIOPSY TRANSRECTAL ULTRASONIC PROSTATE (TUBP);  Surgeon: [name], MD;  Location: [facility];  Service: Urology;  Laterality: N/A;  78 MINS   TRANSURETHRAL RESECTION OF BLADDER TUMOR N/A 03/14/2013   Procedure: TRANSURETHRAL RESECTION OF BLADDER TUMOR (TURBT);  Surgeon: [name], MD;  Location: [facility];  Service: Urology;  Laterality: N/A;  1 HR WITH MITOMYCIN INSTILLATION    TRANSURETHRAL RESECTION OF BLADDER TUMOR N/A 09/30/2013   Procedure: TRANSURETHRAL RESECTION OF BLADDER TUMOR (TURBT);  Surgeon: [name], MD;  Location: [facility];  Service: Urology;  Laterality: N/A;   TRANSURETHRAL RESECTION OF BLADDER TUMOR N/A 03/31/2014   Procedure: TRANSURETHRAL RESECTION OF BLADDER TUMOR (TURBT);  Surgeon: [name], MD;  Location: [facility];  Service: Urology;  Laterality: N/A;   TRANSURETHRAL RESECTION OF BLADDER TUMOR N/A 05/31/2015   Procedure: Cystoscopy with clott evacuation and fulgeration of bleeders, retrograde pylegram;  Surgeon: [name], MD;  Location: [facility];  Service: Urology;  Laterality: N/A;    Current Medications: Outpatient Medications Prior to Visit  Medication Sig Dispense Refill   acetaminophen (TYLENOL) 500 MG tablet Take 500 mg by mouth every 6 (six) hours as needed (pain).     aspirin 81 MG chewable tablet Chew 1 tablet (81 mg total) by mouth daily.     glimepiride (AMARYL) 2 MG tablet Take 1 tablet (2 mg total) by mouth 2 (two) times daily. 180 tablet 3   metoprolol succinate  (TOPROL-XL) 25 MG 24 hr tablet TAKE 3 TABLES TOTAL DAILY 270 tablet 3   TRADJENTA 5 MG TABS tablet TAKE 1 TABLET (5 MG TOTAL) BY MOUTH DAILY. 30 tablet 0   ezetimibe (ZETIA) 10 MG tablet Take 10 mg by mouth daily.     hydrALAZINE (APRESOLINE) 50 MG tablet TAKE 1.5 TABLETS (75 MG TOTAL) BY MOUTH 3 (THREE) TIMES DAILY. 405 tablet 1   tamsulosin (FLOMAX) 0.4 MG CAPS capsule TAKE 1 CAPSULE (0.4 MG TOTAL) BY MOUTH DAILY AFTER SUPPER. (Patient not taking: Reported on 09/08/2022) 90 capsule 3   No facility-administered medications prior to visit.     Allergies:   Lipitor [atorvastatin], Other, and Pravastatin   Social History   Socioeconomic History   Marital status: Married    Spouse name: Not on file   Number of children: Not on file   Years of education: Not on file   Highest education level: Not on file  Occupational History   Not on file  Tobacco Use   Smoking status: Former    Packs/day: 1.00    Years: 30.00    Total pack years: 30.00    Types: Cigarettes    Quit date: 03/11/1993    Years since quitting: 29.5   Smokeless tobacco: Never  Vaping Use   Vaping Use: Never used  Substance and Sexual Activity   Alcohol use: No    Alcohol/week: 0.0 standard drinks of alcohol   Drug use: No   Sexual activity: Not Currently  Other Topics Concern   Not on file  Social History Narrative   Not on file   Social Determinants of Health   Financial Resource Strain: Not on file  Food Insecurity: No Food Insecurity (08/01/2022)   Hunger Vital Sign    Worried About Running Out of Food in the Last Year: Never true    Ran Out of Food in the Last Year: Never true  Transportation Needs: No Transportation Needs (08/01/2022)   PRAPARE - Hydrologist (Medical): No    Lack of Transportation (Non-Medical): No  Physical Activity: Not on file  Stress: Not on file  Social Connections: Not on file     Family History:  The patient's family history includes Asthma in his  mother; Cancer in his child.   Review of Systems:    Please see the history of present illness.     All other systems reviewed and are otherwise negative except as noted above.   Physical Exam:    VS:  BP (!) 144/78   Pulse 78   Ht '5\' 8"'$  (1.727 m)   Wt 220 lb (99.8 kg)   SpO2 99%   BMI 33.45 kg/m    General: Well developed, well nourished,male appearing in no acute distress. Head:  Normocephalic, atraumatic. Neck: No carotid bruits. JVD not elevated.  Lungs: Respirations regular and unlabored, without wheezes or rales.  Heart: Regular rate and rhythm. No S3 or S4.  No murmur, no rubs, or gallops appreciated. Abdomen: Appears non-distended. No obvious abdominal masses. Msk:  Strength and tone appear normal for age. No obvious joint deformities or effusions. Extremities: No clubbing or cyanosis. No pitting edema.  Distal pedal pulses are 2+ bilaterally. Neuro: Alert and oriented X 3. Moves all extremities spontaneously. No focal deficits noted. Psych:  Responds to questions appropriately with a normal affect. Skin: No rashes or lesions noted  Wt Readings from Last 3 Encounters:  10/07/22 220 lb (99.8 kg)  09/08/22 221 lb 12.8 oz (100.6 kg)  07/04/22 223 lb 6.4 oz (101.3 kg)     Studies/Labs Reviewed:   EKG:  EKG is not ordered today.   Recent Labs: 08/23/2022: BUN 17; Creatinine 1.5; TSH 1.17   Lipid Panel    Component Value Date/Time   CHOL 138 11/12/2016 0101   TRIG 372 (A) 08/23/2022 0000   HDL 25 (L) 11/12/2016 0101   CHOLHDL 5.5 11/12/2016 0101   VLDL 39 11/12/2016 0101   LDLCALC 115 08/23/2022 0000    Additional studies/ records that were reviewed today include:   NST: 12/2017 No diagnostic ST segment changes. Occasional PVCs noted throughout the study. Hypertensive response. Technically low risk Duke treadmill score 4.5. Blood pressure demonstrated a hypertensive response to exercise. Small, moderate intensity, partially reversible mid to apical anterior  defect consistent with ischemia. This is a high risk study predominantly based on calculated LVEF. Consider echocardiogram for further confirmation. Nuclear stress EF: 25%.  Echocardiogram: 07/2022 IMPRESSIONS     1. Left ventricular ejection fraction, by estimation, is 45 to 50%. The  left ventricle has mildly decreased function. The left ventricle has no  regional wall motion abnormalities. Left ventricular diastolic parameters  are indeterminate. The average left   ventricular global longitudinal strain is -17.3 %.   2. Right ventricular systolic function is normal. The right ventricular  size is normal. Tricuspid regurgitation signal is inadequate for assessing  PA pressure.   3. The mitral valve is normal in structure. Mild mitral valve  regurgitation. No evidence of mitral stenosis.   4. The tricuspid valve is abnormal.   5. The aortic valve is tricuspid. There is mild calcification of the  aortic valve. There is mild thickening of the aortic valve. Aortic valve  regurgitation is mild. Aortic valve sclerosis/calcification is present,  without any evidence of aortic  stenosis.   6. The inferior vena cava is normal in size with greater than 50%  respiratory variability, suggesting right atrial pressure of 3 mmHg.   Comparison(s): Echocardiogram done 02/21/18 showed an EF of 40-45%.     Assessment:    1. Coronary artery disease involving native coronary artery of native heart without angina pectoris   2. Heart failure with mildly reduced ejection fraction (HFmrEF) ([place])   3. PAD (peripheral artery disease) ([place])   4. Essential hypertension   5. Mixed hyperlipidemia   6. Stage 3a chronic kidney disease ([place])      Plan:   In order of problems listed above:  1. CAD - He is s/p NSTEMI in 2015 with PTCA of LCx, DES to mid-RCA in 02/2015 and balloon angioplasty to LCx in 11/2016. Most recent ischemic evaluation was an NST in 12/2017 which showed a small area of ischemia but  overall lower risk based on perfusion imaging.  EF was read as 25% but higher by echocardiogram. - He denies any recent anginal symptoms.  - Continue ASA '81mg'$  daily and Toprol-XL '75mg'$  daily. Previously intolerant to Zetia and statins. Awaiting input from Pharm D clinic for PCSK9 inhibitor therapy.   2. HFmrEF - His EF was at 40-45% in 2019 and 45-50% by echo in 07/2022. Currently on Hydralazine and Toprol-XL. He feels like Hydralazine is causing diaphoresis and we discussed reducing this from his current dosing of '100mg'$  BID (listed as '75mg'$  TID but he has been taking as '100mg'$  BID) to '50mg'$  BID and adding Losartan '25mg'$  daily. Will recheck a BMET in 2 weeks. Ideally, would like to switch Losartan to [facility] or add an SGLT2 inhibitor if renal function remains stable, but he is concerned about the cost of medications and wants to stay on generics.   3. PAD  - He is s/p L SFA stenting and followed by Vascular Surgery. Last visit was in 05/2021 with instructions to follow-up in 18 months. - He reports pain along his lower legs with activity but feels this is more muscular as it did not improve following his prior stenting. He is due for follow-up with Vascular and we will enter a referral to hopefully get this scheduled soon.   4. HTN - His BP is at 144/78 during today's visit. He is currently taking Hydralazine '100mg'$  BID and Toprol-XL '75mg'$  daily. Will adjust Hydralazine dosing as outlined above and add Losartan.   5. HLD - LDL was elevated to 115 by recent labs in 08/2022. He has been intolerant to multiple statins and Zetia and was recently referred to the Pharm.D. Clinic for consideration of PCSK9 inhibitor therapy. He has not heard back from them in regard to this (appears patient assistance information was submitted) and I will send a staff message to follow-up on this.   6. Stage 3 CKD - Baseline creatinine 1.4  - 1.5. Stable at 1.50 by recent labs in 08/2022. Will recheck a BMET in 2 weeks following  the addition of Losartan.     Medication Adjustments/Labs and Tests Ordered: Current medicines are reviewed at length with the patient today.  Concerns regarding medicines are outlined above.  Medication changes, Labs and Tests ordered today are listed in the Patient Instructions below. Patient Instructions  Medication Instructions:   DECREASE Hydralazine to 50 mg twice a day   START Losartan 25 mg daily for blood pressure   Labwork:  BMET at [place] in 2 weeks (11/17)   Testing/Procedures: None   Follow-Up: Keep January appointment with Dr.Branch   Any Other Special Instructions Will Be Listed Below (If Applicable).  You have been referred to Dr.Brabham, they will call you to make appointment   If you need a refill on your cardiac medications before your next appointment, please call your pharmacy.    Signed, Erma Heritage, PA-C  10/08/2022 6:49 AM    [place] [address] [place], [place] [address] Phone: [phone] Fax: [phone]

## 2022-10-07 NOTE — Patient Instructions (Signed)
Medication Instructions:   DECREASE Hydralazine to 50 mg twice a day   START Losartan 25 mg daily for blood pressure   Labwork:  BMET at [place] in 2 weeks (11/17)   Testing/Procedures: None   Follow-Up: Keep January appointment with Dr.Branch   Any Other Special Instructions Will Be Listed Below (If Applicable).  You have been referred to Dr.Brabham, they will call you to make appointment   If you need a refill on your cardiac medications before your next appointment, please call your pharmacy.

## 2022-10-08 ENCOUNTER — Encounter: Payer: Self-pay | Admitting: Student

## 2022-10-10 ENCOUNTER — Ambulatory Visit: Payer: Medicare HMO | Admitting: Nurse Practitioner

## 2022-10-10 ENCOUNTER — Encounter: Payer: Self-pay | Admitting: Nurse Practitioner

## 2022-10-10 VITALS — BP 128/66 | HR 62 | Ht 68.0 in | Wt 221.4 lb

## 2022-10-10 DIAGNOSIS — E1122 Type 2 diabetes mellitus with diabetic chronic kidney disease: Secondary | ICD-10-CM

## 2022-10-10 DIAGNOSIS — N1831 Chronic kidney disease, stage 3a: Secondary | ICD-10-CM

## 2022-10-10 DIAGNOSIS — I10 Essential (primary) hypertension: Secondary | ICD-10-CM

## 2022-10-10 DIAGNOSIS — E782 Mixed hyperlipidemia: Secondary | ICD-10-CM

## 2022-10-10 LAB — POCT UA - MICROALBUMIN
Albumin/Creatinine Ratio, Urine, POC: <30
Creatinine, POC: 200 mg/dL
Microalbumin Ur, POC: 30 mg/L

## 2022-10-10 MED ORDER — TRADJENTA 5 MG PO TABS: 5.0000 mg | ORAL_TABLET | Freq: Every day | ORAL | 1 refills | Status: DC

## 2022-10-10 NOTE — Patient Instructions (Signed)

## 2022-10-10 NOTE — Progress Notes (Signed)
Endocrinology Follow Up Note       10/10/2022, 1:25 PM   Subjective:    Patient ID: Timothy [name], male    DOB: [birth year].  Timothy [name] is being seen in follow up after being seen in consultation for management of currently uncontrolled symptomatic diabetes requested by  Redmond School, MD.   Past Medical History:  Diagnosis Date   Arthritis    Bladder cancer ([place])    Blood clot in vein 2018   left   CAD (coronary artery disease)    a. 06/2014: NSTEMI s/p PTCA of LCx, 60-80% residual in distal LAD, 70% prox small D1.   b. 02/06/15 NSTEMI  s/p successful PCI/DES to mid RCA c. angioplasty alone to LCx in 11/2016   Decreased GFR 45 % per 01-04-2021 labs   Diabetes mellitus, type 2 (HCC)    Elevated PSA    HLD (hyperlipidemia)    HOH (hard of hearing)    Hypertension    NSTEMI (non-ST elevated myocardial infarction) ([place]) 2015   Peripheral vascular disease ([place])    Poor historian    Wears hearing aid in right ear     Past Surgical History:  Procedure Laterality Date   ABDOMINAL AORTOGRAM W/LOWER EXTREMITY N/A 08/28/2018   Procedure: ABDOMINAL AORTOGRAM W/LOWER EXTREMITY;  Surgeon: Serafina Mitchell, MD;  Location: [place] [facility];  Service: Cardiovascular;  Laterality: N/A;  bilateral   ABDOMINAL AORTOGRAM W/LOWER EXTREMITY N/A 07/09/2019   Procedure: ABDOMINAL AORTOGRAM W/LOWER EXTREMITY;  Surgeon: Serafina Mitchell, MD;  Location: [place] [facility];  Service: Cardiovascular;  Laterality: N/A;  unilateral Lt   CARDIAC CATHETERIZATION  02/06/2015   Procedure: CORONARY STENT INTERVENTION;  Surgeon: [name], MD;  Location: [facility] CATH LAB;  Service: Cardiovascular;;  Mid RCA   CARDIAC CATHETERIZATION N/A 11/11/2016   Procedure: Left Heart Cath and Coronary Angiography;  Surgeon: [name], MD;  Location: [place] [facility];  Service: Cardiovascular;  Laterality: N/A;   CARDIAC CATHETERIZATION  N/A 11/11/2016   Procedure: Coronary Balloon Angioplasty;  Surgeon: [name], MD;  Location: [place] [facility];  Service: Cardiovascular;  Laterality: N/A;   COLONOSCOPY N/A 01/14/2019   Procedure: COLONOSCOPY;  Surgeon: Rogene Houston, MD;  [facility];  Service: Endoscopy;  Laterality: N/A;  730   CORONARY ANGIOPLASTY  11/11/2016   Mid Cx to Dist Cx lesion, 100 %stenosed. This lesion was treated with balloon angioplasty with a 2.0 balloon   CYSTOSCOPY W/ RETROGRADES N/A 05/05/2015   Procedure: CYSTOSCOPY WITH BILATERAL RETROGRADE PYELOGRAMS AND BLADDER BIOPSIES;  Surgeon: [name], MD;  Location: [facility];  Service: Urology;  Laterality: N/A;   CYSTOSCOPY W/ RETROGRADES Bilateral 07/31/2018   Procedure: CYSTOSCOPY WITH BILATERAL RETROGRADE PYELOGRAM;  Surgeon: [name], MD;  Location: [facility];  Service: Urology;  Laterality: Bilateral;   CYSTOSCOPY W/ RETROGRADES Right 02/15/2021   Procedure: CYSTOSCOPY WITH RETROGRADE PYELOGRAM;  Surgeon: [name], MD;  Location: [facility];  Service: Urology;  Laterality: Right;   CYSTOSCOPY WITH BIOPSY N/A 07/31/2018   Procedure: CYSTOSCOPY WITH BIOPSY;  Surgeon: [name], MD;  [facility];  Service: Urology;  Laterality: N/A;  CYSTOSCOPY WITH BIOPSY N/A 02/15/2021   Procedure: CYSTOSCOPY WITH BIOPSY;  Surgeon: [name], MD;  Location: [facility];  Service: Urology;  Laterality: N/A;   LEFT HEART CATHETERIZATION WITH CORONARY ANGIOGRAM N/A 06/17/2014   Procedure: LEFT HEART CATHETERIZATION WITH CORONARY ANGIOGRAM;  Surgeon: [name], MD;  Location: [facility] CATH LAB;  Service: Cardiovascular;  Laterality: N/A;   LEFT HEART CATHETERIZATION WITH CORONARY ANGIOGRAM N/A 02/06/2015   Procedure: LEFT HEART CATHETERIZATION WITH CORONARY ANGIOGRAM;  Surgeon: [name], MD;  Location: [facility] CATH LAB;  Service: Cardiovascular;  Laterality: N/A;   PERIPHERAL VASCULAR  ATHERECTOMY  08/28/2018   Procedure: PERIPHERAL VASCULAR ATHERECTOMY;  Surgeon: Serafina Mitchell, MD;  Location: [place] [facility];  Service: Cardiovascular;;  Prox lt.SFA, Distal SFA   PERIPHERAL VASCULAR INTERVENTION  07/09/2019   Procedure: PERIPHERAL VASCULAR INTERVENTION;  Surgeon: Serafina Mitchell, MD;  Location: [place] [facility];  Service: Cardiovascular;;  Lt. SFA   POLYPECTOMY  01/14/2019   Procedure: POLYPECTOMY;  Surgeon: Rogene Houston, MD;  [facility];  Service: Endoscopy;;  colon   PROSTATE BIOPSY N/A 03/31/2014   Procedure: BIOPSY TRANSRECTAL ULTRASONIC PROSTATE (TUBP);  Surgeon: [name], MD;  Location: [facility];  Service: Urology;  Laterality: N/A;   PROSTATE BIOPSY N/A 02/15/2021   Procedure: BIOPSY TRANSRECTAL ULTRASONIC PROSTATE (TUBP);  Surgeon: [name], MD;  Location: [facility];  Service: Urology;  Laterality: N/A;  18 MINS   TRANSURETHRAL RESECTION OF BLADDER TUMOR N/A 03/14/2013   Procedure: TRANSURETHRAL RESECTION OF BLADDER TUMOR (TURBT);  Surgeon: [name], MD;  Location: [facility];  Service: Urology;  Laterality: N/A;  1 HR WITH MITOMYCIN INSTILLATION    TRANSURETHRAL RESECTION OF BLADDER TUMOR N/A 09/30/2013   Procedure: TRANSURETHRAL RESECTION OF BLADDER TUMOR (TURBT);  Surgeon: [name], MD;  Location: [facility];  Service: Urology;  Laterality: N/A;   TRANSURETHRAL RESECTION OF BLADDER TUMOR N/A 03/31/2014   Procedure: TRANSURETHRAL RESECTION OF BLADDER TUMOR (TURBT);  Surgeon: [name], MD;  Location: [facility];  Service: Urology;  Laterality: N/A;   TRANSURETHRAL RESECTION OF BLADDER TUMOR N/A 05/31/2015   Procedure: Cystoscopy with clott evacuation and fulgeration of bleeders, retrograde pylegram;  Surgeon: [name], MD;  Location: [facility];  Service: Urology;  Laterality: N/A;    Social History   Socioeconomic  History   Marital status: Married    Spouse name: Not on file   Number of children: Not on file   Years of education: Not on file   Highest education level: Not on file  Occupational History   Not on file  Tobacco Use   Smoking status: Former    Packs/day: 1.00    Years: 30.00    Total pack years: 30.00    Types: Cigarettes    Quit date: 03/11/1993    Years since quitting: 29.6   Smokeless tobacco: Never  Vaping Use   Vaping Use: Never used  Substance and Sexual Activity   Alcohol use: No    Alcohol/week: 0.0 standard drinks of alcohol   Drug use: No   Sexual activity: Not Currently  Other Topics Concern   Not on file  Social History Narrative   Not on file   Social Determinants of Health   Financial Resource Strain: Not on file  Food Insecurity: No Food Insecurity (08/01/2022)   Hunger Vital Sign    Worried About Running Out of Food in  the Last Year: Never true    [place] in the Last Year: Never true  Transportation Needs: No Transportation Needs (08/01/2022)   PRAPARE - Hydrologist (Medical): No    Lack of Transportation (Non-Medical): No  Physical Activity: Not on file  Stress: Not on file  Social Connections: Not on file    Family History  Problem Relation Age of Onset   Cancer Child    Asthma Mother     Outpatient Encounter Medications as of 10/10/2022  Medication Sig   acetaminophen (TYLENOL) 500 MG tablet Take 500 mg by mouth every 6 (six) hours as needed (pain).   aspirin 81 MG chewable tablet Chew 1 tablet (81 mg total) by mouth daily.   glimepiride (AMARYL) 2 MG tablet Take 1 tablet (2 mg total) by mouth 2 (two) times daily.   hydrALAZINE (APRESOLINE) 50 MG tablet Take 1 tablet (50 mg total) by mouth in the morning and at bedtime.   losartan (COZAAR) 25 MG tablet Take 1 tablet (25 mg total) by mouth daily.   metoprolol succinate (TOPROL-XL) 25 MG 24 hr tablet TAKE 3 TABLES TOTAL DAILY   [DISCONTINUED] TRADJENTA 5 MG  TABS tablet TAKE 1 TABLET (5 MG TOTAL) BY MOUTH DAILY.   TRADJENTA 5 MG TABS tablet Take 1 tablet (5 mg total) by mouth daily.   [DISCONTINUED] tamsulosin (FLOMAX) 0.4 MG CAPS capsule TAKE 1 CAPSULE (0.4 MG TOTAL) BY MOUTH DAILY AFTER SUPPER. (Patient not taking: Reported on 09/08/2022)   No facility-administered encounter medications on file as of 10/10/2022.    ALLERGIES: Allergies  Allergen Reactions   Lipitor [Atorvastatin] Other (See Comments)    Myalgia   Other Other (See Comments)    All Cholesterol Meds - causes muscle and joint pain   Pravastatin Other (See Comments)    Myalgia    VACCINATION STATUS: Immunization History  Administered Date(s) Administered   Influenza, High Dose Seasonal PF 08/29/2017, 08/16/2018   Influenza-Unspecified 09/08/2014    Diabetes He presents for his follow-up diabetic visit. He has type 2 diabetes mellitus. Onset time: diagnosed at approx age of 61. His disease course has been improving. There are no hypoglycemic associated symptoms. There are no diabetic associated symptoms. There are no hypoglycemic complications. Diabetic complications include nephropathy. Risk factors for coronary artery disease include diabetes mellitus, dyslipidemia, family history, male sex, obesity, hypertension and sedentary lifestyle. Current diabetic treatment includes oral agent (dual therapy) (Tradjenta 5 mg and Glimepiride 2 mg po BID). He is compliant with treatment most of the time. His weight is fluctuating minimally. He is following a generally unhealthy diet. When asked about meal planning, he reported none. He has not had a previous visit with a dietitian. He rarely participates in exercise. His home blood glucose trend is fluctuating minimally. His breakfast blood glucose range is generally 140-180 mg/dl. His bedtime blood glucose range is generally 110-130 mg/dl. (He presents today with his logs, no meter, showing above target glycemic profile and inconsistent  glucose monitoring.  He was not due for another A1c today.  He denies any hypoglycemia.  Says he is still working on healthy diet and routine eating pattern.) An ACE inhibitor/angiotensin II receptor blocker is not being taken. He sees a podiatrist.Eye exam is current.     Review of systems  Constitutional: + steadily increasing body weight, current Body mass index is 33.66 kg/m., no fatigue, no subjective hyperthermia, no subjective hypothermia Eyes: no blurry vision, no xerophthalmia ENT:  no sore throat, no nodules palpated in throat, no dysphagia/odynophagia, no hoarseness Cardiovascular: no chest pain, no shortness of breath, no palpitations, no leg swelling Respiratory: no cough, no shortness of breath Gastrointestinal: no nausea/vomiting/diarrhea Musculoskeletal: no muscle/joint aches Skin: no rashes, no hyperemia Neurological: no tremors, no numbness, no tingling, no dizziness Psychiatric: no depression, no anxiety  Objective:     BP 128/66 (BP Location: Right Arm, Patient Position: Sitting, Cuff Size: Large)   Pulse 62   Ht '5\' 8"'$  (1.727 m)   Wt 221 lb 6.4 oz (100.4 kg)   BMI 33.66 kg/m   Wt Readings from Last 3 Encounters:  10/10/22 221 lb 6.4 oz (100.4 kg)  10/07/22 220 lb (99.8 kg)  09/08/22 221 lb 12.8 oz (100.6 kg)     BP Readings from Last 3 Encounters:  10/10/22 128/66  10/07/22 (!) 144/78  09/08/22 (!) 152/73     Physical Exam- Limited  Constitutional:  Body mass index is 33.66 kg/m. , not in acute distress, normal state of mind, HOH wears bilateral hearing aids Eyes:  EOMI, no exophthalmos Neck: Supple Cardiovascular: RRR, no murmurs, rubs, or gallops, no edema Respiratory: Adequate breathing efforts, no crackles, rales, rhonchi, or wheezing Musculoskeletal: no gross deformities, strength intact in all four extremities, no gross restriction of joint movements Skin:  no rashes, no hyperemia Neurological: no tremor with outstretched hands   Diabetic  Foot Exam - Simple   No data filed     CMP ( most recent) CMP     Component Value Date/Time   NA 140 02/15/2021 0701   K 4.3 02/15/2021 0701   CL 102 02/15/2021 0701   CO2 25 01/04/2021 0953   GLUCOSE 193 (H) 02/15/2021 0701   BUN 17 08/23/2022 0000   CREATININE 1.5 (A) 08/23/2022 0000   CREATININE 1.30 (H) 02/15/2021 0701   CREATININE 1.43 (H) 02/25/2020 1148   CALCIUM 9.2 01/04/2021 0953   PROT 7.2 11/10/2016 2351   ALBUMIN 4.2 11/10/2016 2351   AST 32 11/10/2016 2351   ALT 20 11/10/2016 2351   ALKPHOS 47 11/10/2016 2351   BILITOT 0.5 11/10/2016 2351   GFRNONAA 45 (L) 01/04/2021 0953   GFRAA 47 (L) 07/09/2019 0801     Diabetic Labs (most recent): Lab Results  Component Value Date   HGBA1C 7.9 08/23/2022   HGBA1C 7.2 (H) 07/30/2018   HGBA1C 6.3 (H) 11/11/2016   MICROALBUR 30 mg'l 10/10/2022     Lipid Panel ( most recent) Lipid Panel     Component Value Date/Time   CHOL 138 11/12/2016 0101   TRIG 372 (A) 08/23/2022 0000   HDL 25 (L) 11/12/2016 0101   CHOLHDL 5.5 11/12/2016 0101   VLDL 39 11/12/2016 0101   LDLCALC 115 08/23/2022 0000      Lab Results  Component Value Date   TSH 1.17 08/23/2022   TSH 1.805 02/06/2015           Assessment & Plan:   1) Type 2 diabetes mellitus with stage 3a chronic kidney disease, without long-term current use of insulin ([place])  He presents today with his logs, no meter, showing above target glycemic profile and inconsistent glucose monitoring.  He was not due for another A1c today.  He denies any hypoglycemia.  Says he is still working on healthy diet and routine eating pattern.  - Timothy [name] has currently uncontrolled symptomatic type 2 DM since 76 years of age, with most recent A1c of 7.9 %.   -Recent labs reviewed.  -  I had a long discussion with him about the progressive nature of diabetes and the pathology behind its complications. -his diabetes is complicated by mild CKD and he remains at a high risk  for more acute and chronic complications which include CAD, CVA, CKD, retinopathy, and neuropathy. These are all discussed in detail with him.  The following Lifestyle Medicine recommendations according to [facility]) were discussed and offered to patient and he agrees to start the journey:  A. Whole Foods, Plant-based plate comprising of fruits and vegetables, plant-based proteins, whole-grain carbohydrates was discussed in detail with the patient.   A list for source of those nutrients were also provided to the patient.  Patient will use only water or unsweetened tea for hydration. B.  The need to stay away from risky substances including alcohol, smoking; obtaining 7 to 9 hours of restorative sleep, at least 150 minutes of moderate intensity exercise weekly, the importance of healthy social connections,  and stress reduction techniques were discussed. C.  A full color page of  Calorie density of various food groups per pound showing examples of each food groups was provided to the patient.  - Nutritional counseling repeated at each appointment due to patients tendency to fall back in to old habits.  - The patient admits there is a room for improvement in their diet and drink choices. -  Suggestion is made for the patient to avoid simple carbohydrates from their diet including Cakes, Sweet Desserts / Pastries, Ice Cream, Soda (diet and regular), Sweet Tea, Candies, Chips, Cookies, Sweet Pastries, Store Bought Juices, Alcohol in Excess of 1-2 drinks a day, Artificial Sweeteners, Coffee Creamer, and "Sugar-free" Products. This will help patient to have stable blood glucose profile and potentially avoid unintended weight gain.   - I encouraged the patient to switch to unprocessed or minimally processed complex starch and increased protein intake (animal or plant source), fruits, and vegetables.   - Patient is advised to stick to a routine mealtimes to eat 3 meals a day  and avoid unnecessary snacks (to snack only to correct hypoglycemia).  - I have approached him with the following individualized plan to manage his diabetes and patient agrees:   -he is encouraged to start monitoring glucose twice daily, before breakfast and before bed, to log their readings on the clinic sheets provided, and bring them to review at follow up appointment in 4 weeks.  - Adjustment parameters are given to him for hypo and hyperglycemia in writing. - he is encouraged to call clinic for blood glucose levels less than 70 or above 300 mg /dl. - he is advised to continue Glimepiride 2 mg po twice daily with meals, therapeutically suitable for patient.  I did continue his Tradjenta 5 mg po daily today but may look at different option at next visit due to high cost.  - Specific targets for  A1c; LDL, HDL, and Triglycerides were discussed with the patient.  2) Blood Pressure /Hypertension:  his blood pressure is controlled to target.   he is advised to continue his current medications including Hydralazine 75 mg p.o. TID and Metoprolol 25 mg po daily.  3) Lipids/Hyperlipidemia:    Review of his recent lipid panel from 08/23/22 showed uncontrolled LDL at 115 and elevated triglycerides of 372.  He is statin intolerant says his PCP was going to start him on Repatha but has not heard about it yet.  4)  Weight/Diet:  his Body mass index is 33.66 kg/m.  -  clearly complicating his diabetes care.   he is a candidate for weight loss. I discussed with him the fact that loss of 5 - 10% of his  current body weight will have the most impact on his diabetes management.  Exercise, and detailed carbohydrates information provided  -  detailed on discharge instructions.  5) Chronic Care/Health Maintenance: -he is not on ACEI/ARB and is intolerant to Statin medications and is encouraged to initiate and continue to follow up with Ophthalmology, Dentist, Podiatrist at least yearly or according to  recommendations, and advised to stay away from smoking. I have recommended yearly flu vaccine and pneumonia vaccine at least every 5 years; moderate intensity exercise for up to 150 minutes weekly; and sleep for at least 7 hours a day.  - he is advised to maintain close follow up with Redmond School, MD for primary care needs, as well as his other providers for optimal and coordinated care.     I spent 30 minutes in the care of the patient today including review of labs from [place], Lipids, Thyroid Function, Hematology (current and previous including abstractions from other facilities); face-to-face time discussing  his blood glucose readings/logs, discussing hypoglycemia and hyperglycemia episodes and symptoms, medications doses, his options of short and long term treatment based on the latest standards of care / guidelines;  discussion about incorporating lifestyle medicine;  and documenting the encounter. Risk reduction counseling performed per USPSTF guidelines to reduce obesity and cardiovascular risk factors.     Please refer to Patient Instructions for Blood Glucose Monitoring and Insulin/Medications Dosing Guide"  in media tab for additional information. Please  also refer to " Patient Self Inventory" in the Media  tab for reviewed elements of pertinent patient history.  Janan Halter participated in the discussions, expressed understanding, and voiced agreement with the above plans.  All questions were answered to his satisfaction. he is encouraged to contact clinic should he have any questions or concerns prior to his return visit.     Follow up plan: - Return in about 3 months (around 01/10/2023) for Diabetes F/U with A1c in office, No previsit labs, Bring meter and logs.   Rayetta Pigg, [facility] [address] [place], [place] [address] Phone: [phone] Fax: [phone]  10/10/2022, 1:25 PM

## 2022-10-11 ENCOUNTER — Telehealth: Payer: Self-pay | Admitting: Pharmacist

## 2022-10-18 ENCOUNTER — Encounter (INDEPENDENT_AMBULATORY_CARE_PROVIDER_SITE_OTHER): Payer: Self-pay | Admitting: Gastroenterology

## 2022-10-18 ENCOUNTER — Other Ambulatory Visit: Payer: Medicare HMO | Admitting: Urology

## 2022-10-18 DIAGNOSIS — N401 Enlarged prostate with lower urinary tract symptoms: Secondary | ICD-10-CM

## 2022-10-18 DIAGNOSIS — C61 Malignant neoplasm of prostate: Secondary | ICD-10-CM

## 2022-10-19 ENCOUNTER — Ambulatory Visit: Payer: Self-pay | Admitting: *Deleted

## 2022-10-21 ENCOUNTER — Other Ambulatory Visit: Payer: Self-pay | Admitting: Cardiology

## 2022-10-21 ENCOUNTER — Encounter: Payer: Self-pay | Admitting: *Deleted

## 2022-10-21 DIAGNOSIS — I10 Essential (primary) hypertension: Secondary | ICD-10-CM | POA: Diagnosis not present

## 2022-10-21 NOTE — Patient Outreach (Signed)
  Care Coordination   Follow Up Visit Note   10/20/2022 Name: Ronav Furney MRN: [id number] DOB: May 20, 1946  Marico Buckle is a 76 y.o. year old male who sees Redmond School, MD for primary care. I spoke with  Janan Halter by phone today.  What matters to the patients health and wellness today?  Managing blood sugar and reason for hotflashes/night sweats    Goals Addressed               This Visit's Progress     Patient Stated     Effective DM management (pt-stated)        Care Coordination Interventions: Reviewed medications with patient and discussed importance of medication adherence Counseled on importance of regular laboratory monitoring as prescribed Discussed plans with patient for ongoing care management follow up and provided patient with direct contact information for care management team Review of patient status, including review of consultants reports, relevant laboratory and other test results, and medications completed Assessed social determinant of health barriers Advised patient to f/u with PCP regarding hot flashes and night sweats. Reviewed a few potential medical issues that can cause this. Encouraged to follow-up sooner with any new or worsening conditions.  Provided with [place] contact number and encouraged to reach out as needed         SDOH assessments and interventions completed:  Yes  SDOH Interventions Today    Flowsheet Row Most Recent Value  SDOH Interventions   Housing Interventions Intervention Not Indicated  Transportation Interventions Intervention Not Indicated  Financial Strain Interventions Intervention Not Indicated        Care Coordination Interventions Activated:  Yes  Care Coordination Interventions:  Yes, provided   Follow up plan: Follow up call scheduled for 11/14/22    Encounter Outcome:  Pt. Visit Completed   Chong Sicilian, BSN, RN-BC RN Care Coordinator [place]:  [phone] Main #: [phone]

## 2022-10-21 NOTE — Telephone Encounter (Signed)
Praluent PA sent to [facility] and approved to 12/04/2022  [facility], will need to switch to [place] for January 2024)

## 2022-10-22 LAB — BASIC METABOLIC PANEL
BUN/Creatinine Ratio: 12 (ref 10–24)
BUN: 17 mg/dL (ref 8–27)
CO2: 20 mmol/L (ref 20–29)
Calcium: 9.6 mg/dL (ref 8.6–10.2)
Chloride: 104 mmol/L (ref 96–106)
Creatinine, Ser: 1.44 mg/dL — ABNORMAL HIGH (ref 0.76–1.27)
Glucose: 183 mg/dL — ABNORMAL HIGH (ref 70–99)
Potassium: 4.9 mmol/L (ref 3.5–5.2)
Sodium: 143 mmol/L (ref 134–144)
eGFR: 51 mL/min/{1.73_m2} — ABNORMAL LOW (ref >59)

## 2022-11-11 ENCOUNTER — Other Ambulatory Visit: Payer: Self-pay | Admitting: *Deleted

## 2022-11-11 DIAGNOSIS — I739 Peripheral vascular disease, unspecified: Secondary | ICD-10-CM

## 2022-11-14 ENCOUNTER — Ambulatory Visit: Payer: Self-pay | Admitting: *Deleted

## 2022-11-14 NOTE — Patient Outreach (Signed)
  Care Coordination   Follow Up Visit Note   11/14/2022 Name: Timothy [name] MRN: [id number] DOB: Jul 02, 1946  Timothy [name] is a 76 y.o. year old male who sees Redmond School, MD for primary care. I spoke with  Janan Halter by phone today.  What matters to the patients health and wellness today?  Hot flashes and night sweats    Goals Addressed               This Visit's Progress     Patient Stated     Effective DM management (pt-stated)        Care Coordination Interventions: Reviewed medications with patient and discussed importance of medication adherence Counseled on importance of regular laboratory monitoring as prescribed Discussed plans with patient for ongoing care management follow up and provided patient with direct contact information for care management team Review of patient status, including review of consultants reports, relevant laboratory and other test results, and medications completed Assessed social determinant of health barriers Advised patient to f/u with PCP regarding hot flashes and night sweats. Reviewed a few potential medical issues that can cause this. Encouraged to follow-up sooner with any new or worsening conditions. Patient has not scheduled an appointment yet, but plans to. Denies any new or worsening symptoms.  Provided with [place] contact number and encouraged to reach out as needed         SDOH assessments and interventions completed:  Yes  SDOH Interventions Today    Flowsheet Row Most Recent Value  SDOH Interventions   Food Insecurity Interventions Intervention Not Indicated  Housing Interventions Intervention Not Indicated  Transportation Interventions Intervention Not Indicated  Utilities Interventions Intervention Not Indicated  Financial Strain Interventions Intervention Not Indicated        Care Coordination Interventions:  Yes, provided   Follow up plan: Follow up call scheduled for 12/15/22    Encounter  Outcome:  Pt. Visit Completed   Chong Sicilian, BSN, RN-BC RN Care Coordinator [place]: [phone] Main #: [phone]

## 2022-11-21 ENCOUNTER — Ambulatory Visit (HOSPITAL_COMMUNITY)
Admission: RE | Admit: 2022-11-21 | Discharge: 2022-11-21 | Disposition: A | Discharge status: Home / Self Care | Payer: Medicare HMO | Source: Ambulatory Visit | Attending: Surgery | Admitting: Surgery

## 2022-11-21 ENCOUNTER — Ambulatory Visit: Payer: Medicare HMO

## 2022-11-21 DIAGNOSIS — I739 Peripheral vascular disease, unspecified: Secondary | ICD-10-CM

## 2022-11-25 DIAGNOSIS — R232 Flushing: Secondary | ICD-10-CM | POA: Diagnosis not present

## 2022-11-25 DIAGNOSIS — E1129 Type 2 diabetes mellitus with other diabetic kidney complication: Secondary | ICD-10-CM | POA: Diagnosis not present

## 2022-11-25 DIAGNOSIS — E1165 Type 2 diabetes mellitus with hyperglycemia: Secondary | ICD-10-CM | POA: Diagnosis not present

## 2022-11-25 DIAGNOSIS — Z6834 Body mass index (BMI) 34.0-34.9, adult: Secondary | ICD-10-CM | POA: Diagnosis not present

## 2022-11-25 DIAGNOSIS — E6609 Other obesity due to excess calories: Secondary | ICD-10-CM | POA: Diagnosis not present

## 2022-11-30 ENCOUNTER — Ambulatory Visit (INDEPENDENT_AMBULATORY_CARE_PROVIDER_SITE_OTHER)
Admission: RE | Admit: 2022-11-30 | Discharge: 2022-11-30 | Disposition: A | Discharge status: Home / Self Care | Payer: Medicare HMO | Source: Ambulatory Visit | Attending: Surgery | Admitting: Surgery

## 2022-11-30 ENCOUNTER — Ambulatory Visit (HOSPITAL_COMMUNITY)
Admission: RE | Admit: 2022-11-30 | Discharge: 2022-11-30 | Disposition: A | Discharge status: Home / Self Care | Payer: Medicare HMO | Source: Ambulatory Visit | Attending: Surgery | Admitting: Surgery

## 2022-11-30 ENCOUNTER — Ambulatory Visit: Payer: Medicare HMO | Admitting: Physician Assistant

## 2022-11-30 VITALS — BP 166/80 | HR 58 | Temp 97.8°F | Ht 68.0 in | Wt 220.0 lb

## 2022-11-30 DIAGNOSIS — I739 Peripheral vascular disease, unspecified: Secondary | ICD-10-CM | POA: Diagnosis not present

## 2022-11-30 DIAGNOSIS — R232 Flushing: Secondary | ICD-10-CM | POA: Diagnosis not present

## 2022-11-30 DIAGNOSIS — E1140 Type 2 diabetes mellitus with diabetic neuropathy, unspecified: Secondary | ICD-10-CM | POA: Diagnosis not present

## 2022-11-30 DIAGNOSIS — I10 Essential (primary) hypertension: Secondary | ICD-10-CM | POA: Diagnosis not present

## 2022-11-30 NOTE — Progress Notes (Signed)
Office Note     CC:  follow up Requesting Provider:  Redmond School, MD  HPI: Timothy [name] is a 76 y.o. ([birth year]) male who presents for surveillance of PAD.  He has history of atherectomy and drug-coated balloon angioplasty of left SFA in 2019 for claudication symptoms.  He developed recurrent stenosis and underwent left SFA stenting in August 2020 by Dr. Trula Slade.  Currently denies any claudication, rest pain, or tissue loss of bilateral lower extremities.  He continues to take his aspirin daily.  He is a former smoker.  He follows regularly with his PCP for management of chronic medical conditions including type 2 diabetes mellitus and hypertension.    Past Medical History:  Diagnosis Date   Arthritis    Bladder cancer ([place])    Blood clot in vein 2018   left   CAD (coronary artery disease)    a. 06/2014: NSTEMI s/p PTCA of LCx, 60-80% residual in distal LAD, 70% prox small D1.   b. 02/06/15 NSTEMI  s/p successful PCI/DES to mid RCA c. angioplasty alone to LCx in 11/2016   Decreased GFR 45 % per 01-04-2021 labs   Diabetes mellitus, type 2 (HCC)    Elevated PSA    HLD (hyperlipidemia)    HOH (hard of hearing)    Hypertension    NSTEMI (non-ST elevated myocardial infarction) ([place]) 2015   Peripheral vascular disease ([place])    Poor historian    Wears hearing aid in right ear     Past Surgical History:  Procedure Laterality Date   ABDOMINAL AORTOGRAM W/LOWER EXTREMITY N/A 08/28/2018   Procedure: ABDOMINAL AORTOGRAM W/LOWER EXTREMITY;  Surgeon: Serafina Mitchell, MD;  Location: [place] [facility];  Service: Cardiovascular;  Laterality: N/A;  bilateral   ABDOMINAL AORTOGRAM W/LOWER EXTREMITY N/A 07/09/2019   Procedure: ABDOMINAL AORTOGRAM W/LOWER EXTREMITY;  Surgeon: Serafina Mitchell, MD;  Location: [place] [facility];  Service: Cardiovascular;  Laterality: N/A;  unilateral Lt   CARDIAC CATHETERIZATION  02/06/2015   Procedure: CORONARY STENT INTERVENTION;  Surgeon: [name], MD;   Location: [facility] C[facility] LAB;  Service: Cardiovascular;;  Mid RCA   CARDIAC CATHETERIZATION N/A 11/11/2016   Procedure: Left Heart Cath and Coronary Angiography;  Surgeon: [name], MD;  Location: [place] [facility];  Service: Cardiovascular;  Laterality: N/A;   CARDIAC CATHETERIZATION N/A 11/11/2016   Procedure: Coronary Balloon Angioplasty;  Surgeon: [name], MD;  Location: [place] [facility];  Service: Cardiovascular;  Laterality: N/A;   COLONOSCOPY N/A 01/14/2019   Procedure: COLONOSCOPY;  Surgeon: Rogene Houston, MD;  [facility];  Service: Endoscopy;  Laterality: N/A;  730   CORONARY ANGIOPLASTY  11/11/2016   Mid Cx to Dist Cx lesion, 100 %stenosed. This lesion was treated with balloon angioplasty with a 2.0 balloon   CYSTOSCOPY W/ RETROGRADES N/A 05/05/2015   Procedure: CYSTOSCOPY WITH BILATERAL RETROGRADE PYELOGRAMS AND BLADDER BIOPSIES;  Surgeon: [name], MD;  Location: [facility];  Service: Urology;  Laterality: N/A;   CYSTOSCOPY W/ RETROGRADES Bilateral 07/31/2018   Procedure: CYSTOSCOPY WITH BILATERAL RETROGRADE PYELOGRAM;  Surgeon: [name], MD;  Location: [facility];  Service: Urology;  Laterality: Bilateral;   CYSTOSCOPY W/ RETROGRADES Right 02/15/2021   Procedure: CYSTOSCOPY WITH RETROGRADE PYELOGRAM;  Surgeon: [name], MD;  Location: [facility];  Service: Urology;  Laterality: Right;   CYSTOSCOPY WITH BIOPSY N/A 07/31/2018   Procedure: CYSTOSCOPY WITH BIOPSY;  Surgeon: [name], MD;  Location: [facility];  Service: Urology;  Laterality: N/A;   CYSTOSCOPY WITH BIOPSY N/A 02/15/2021   Procedure: CYSTOSCOPY WITH BIOPSY;  Surgeon: [name], MD;  Location: [facility];  Service: Urology;  Laterality: N/A;   LEFT HEART CATHETERIZATION WITH CORONARY ANGIOGRAM N/A 06/17/2014   Procedure: LEFT HEART CATHETERIZATION WITH CORONARY ANGIOGRAM;  Surgeon: [name], MD;  Location: [facility] CATH LAB;   Service: Cardiovascular;  Laterality: N/A;   LEFT HEART CATHETERIZATION WITH CORONARY ANGIOGRAM N/A 02/06/2015   Procedure: LEFT HEART CATHETERIZATION WITH CORONARY ANGIOGRAM;  Surgeon: [name], MD;  Location: [facility] CATH LAB;  Service: Cardiovascular;  Laterality: N/A;   PERIPHERAL VASCULAR ATHERECTOMY  08/28/2018   Procedure: PERIPHERAL VASCULAR ATHERECTOMY;  Surgeon: Serafina Mitchell, MD;  Location: [place] [facility];  Service: Cardiovascular;;  Prox lt.SFA, Distal SFA   PERIPHERAL VASCULAR INTERVENTION  07/09/2019   Procedure: PERIPHERAL VASCULAR INTERVENTION;  Surgeon: Serafina Mitchell, MD;  Location: [place] [facility];  Service: Cardiovascular;;  Lt. SFA   POLYPECTOMY  01/14/2019   Procedure: POLYPECTOMY;  Surgeon: Rogene Houston, MD;  [facility];  Service: Endoscopy;;  colon   PROSTATE BIOPSY N/A 03/31/2014   Procedure: BIOPSY TRANSRECTAL ULTRASONIC PROSTATE (TUBP);  Surgeon: [name], MD;  Location: [facility];  Service: Urology;  Laterality: N/A;   PROSTATE BIOPSY N/A 02/15/2021   Procedure: BIOPSY TRANSRECTAL ULTRASONIC PROSTATE (TUBP);  Surgeon: [name], MD;  Location: [facility];  Service: Urology;  Laterality: N/A;  55 MINS   TRANSURETHRAL RESECTION OF BLADDER TUMOR N/A 03/14/2013   Procedure: TRANSURETHRAL RESECTION OF BLADDER TUMOR (TURBT);  Surgeon: [name], MD;  Location: [facility];  Service: Urology;  Laterality: N/A;  1 HR WITH MITOMYCIN INSTILLATION    TRANSURETHRAL RESECTION OF BLADDER TUMOR N/A 09/30/2013   Procedure: TRANSURETHRAL RESECTION OF BLADDER TUMOR (TURBT);  Surgeon: [name], MD;  Location: [facility];  Service: Urology;  Laterality: N/A;   TRANSURETHRAL RESECTION OF BLADDER TUMOR N/A 03/31/2014   Procedure: TRANSURETHRAL RESECTION OF BLADDER TUMOR (TURBT);  Surgeon: [name], MD;  Location: [facility];  Service: Urology;   Laterality: N/A;   TRANSURETHRAL RESECTION OF BLADDER TUMOR N/A 05/31/2015   Procedure: Cystoscopy with clott evacuation and fulgeration of bleeders, retrograde pylegram;  Surgeon: [name], MD;  Location: [facility];  Service: Urology;  Laterality: N/A;    Social History   Socioeconomic History   Marital status: Married    Spouse name: Not on file   Number of children: Not on file   Years of education: Not on file   Highest education level: Not on file  Occupational History   Not on file  Tobacco Use   Smoking status: Former    Packs/day: 1.00    Years: 30.00    Total pack years: 30.00    Types: Cigarettes    Quit date: 03/11/1993    Years since quitting: 29.7   Smokeless tobacco: Never  Vaping Use   Vaping Use: Never used  Substance and Sexual Activity   Alcohol use: No    Alcohol/week: 0.0 standard drinks of alcohol   Drug use: No   Sexual activity: Not Currently  Other Topics Concern   Not on file  Social History Narrative   Not on file   Social Determinants of Health   Financial Resource Strain: Low Risk  (11/14/2022)   Overall Financial Resource Strain (CARDIA)    Difficulty of Paying Living  Expenses: Not very hard  Food Insecurity: No Food Insecurity (11/14/2022)   Hunger Vital Sign    Worried About Running Out of Food in the Last Year: Never true    Ran Out of Food in the Last Year: Never true  Transportation Needs: No Transportation Needs (11/14/2022)   PRAPARE - Hydrologist (Medical): No    Lack of Transportation (Non-Medical): No  Physical Activity: Not on file  Stress: Not on file  Social Connections: Not on file  Intimate Partner Violence: Not on file    Family History  Problem Relation Age of Onset   Cancer Child    Asthma Mother     Current Outpatient Medications  Medication Sig Dispense Refill   acetaminophen (TYLENOL) 500 MG tablet Take 500 mg by mouth every 6 (six) hours as needed (pain).     aspirin 81 MG  chewable tablet Chew 1 tablet (81 mg total) by mouth daily.     glimepiride (AMARYL) 2 MG tablet Take 1 tablet (2 mg total) by mouth 2 (two) times daily. 180 tablet 3   hydrALAZINE (APRESOLINE) 50 MG tablet Take 1 tablet (50 mg total) by mouth in the morning and at bedtime. 180 tablet 3   losartan (COZAAR) 25 MG tablet Take 1 tablet (25 mg total) by mouth daily. 90 tablet 3   metoprolol succinate (TOPROL-XL) 25 MG 24 hr tablet TAKE 3 TABLES TOTAL DAILY 270 tablet 3   TRADJENTA 5 MG TABS tablet Take 1 tablet (5 mg total) by mouth daily. 90 tablet 1   No current facility-administered medications for this visit.    Allergies  Allergen Reactions   Lipitor [Atorvastatin] Other (See Comments)    Myalgia   Other Other (See Comments)    All Cholesterol Meds - causes muscle and joint pain   Pravastatin Other (See Comments)    Myalgia     REVIEW OF SYSTEMS:   '[X]'$  denotes positive finding, '[ ]'$  denotes negative finding Cardiac  Comments:  Chest pain or chest pressure:    Shortness of breath upon exertion:    Short of breath when lying flat:    Irregular heart rhythm:        Vascular    Pain in calf, thigh, or hip brought on by ambulation:    Pain in feet at night that wakes you up from your sleep:     Blood clot in your veins:    Leg swelling:         Pulmonary    Oxygen at home:    Productive cough:     Wheezing:         Neurologic    Sudden weakness in arms or legs:     Sudden numbness in arms or legs:     Sudden onset of difficulty speaking or slurred speech:    Temporary loss of vision in one eye:     Problems with dizziness:         Gastrointestinal    Blood in stool:     Vomited blood:         Genitourinary    Burning when urinating:     Blood in urine:        Psychiatric    Major depression:         Hematologic    Bleeding problems:    Problems with blood clotting too easily:        Skin    Rashes or ulcers:  Constitutional    Fever or chills:       PHYSICAL EXAMINATION:  Vitals:   11/30/22 1343  BP: (!) 166/80  Pulse: (!) 58  Temp: 97.8 F (36.6 C)  TempSrc: Temporal  SpO2: 97%  Weight: 220 lb (99.8 kg)  Height: '5\' 8"'$  (1.727 m)    General:  WDWN in NAD; vital signs documented above Gait: Not observed HENT: WNL, normocephalic Pulmonary: normal non-labored breathing , without Rales, rhonchi,  wheezing Cardiac: regular HR Abdomen: soft, NT, no masses Skin: without rashes Vascular Exam/Pulses:  Right Left  Radial 2+ (normal) 2+ (normal)  DP absent 2+ (normal)  PT 2+ (normal) 2+ (normal)   Extremities: without ischemic changes, without Gangrene , without cellulitis; without open wounds;  Musculoskeletal: no muscle wasting or atrophy  Neurologic: A&O X 3;  No focal weakness or paresthesias are detected Psychiatric:  The pt has Normal affect.   Non-Invasive Vascular Imaging:   Left SFA stent widely patent  ABI/TBIToday's ABIToday's TBIPrevious ABIPrevious TBI  +-------+-----------+-----------+------------+------------+  Right 0.85       0.59       1.06        0.77          +-------+-----------+-----------+------------+------------+  Left  0.90       0.60       1.03        0.74          +-------+-----------+-----------+------------+------------+   ASSESSMENT/PLAN:: 76 y.o. male here for follow up for surveillance of PAD  -Subjectively, patient is without any symptoms of claudication -Arterial duplex demonstrates a widely patent SFA stent.  He has palpable pedal pulses bilaterally on exam -Continue aspirin daily -Encourage patient to walk is much as possible -Repeat left lower extremity arterial duplex and ABI in 18 months   Timothy Ligas, PA-C [facility] [phone]  Clinic MD:   Scot Dock

## 2022-12-15 ENCOUNTER — Encounter: Payer: Self-pay | Admitting: *Deleted

## 2022-12-21 ENCOUNTER — Other Ambulatory Visit (HOSPITAL_COMMUNITY): Payer: Self-pay

## 2022-12-21 ENCOUNTER — Telehealth: Payer: Self-pay

## 2022-12-21 DIAGNOSIS — E7800 Pure hypercholesterolemia, unspecified: Secondary | ICD-10-CM

## 2022-12-21 DIAGNOSIS — I214 Non-ST elevation (NSTEMI) myocardial infarction: Secondary | ICD-10-CM

## 2022-12-21 NOTE — Telephone Encounter (Signed)
Pharmacy Patient Advocate Encounter  Prior Authorization for Repatha '140mg'$ /ml has been approved.    key# [id number]  Effective dates: 1.1.24 through 12.31.24.  Please note that ''praluent is no longer the preferred drug''

## 2022-12-21 NOTE — Telephone Encounter (Signed)
Pharmacy Patient Advocate Encounter   Received notification from [facility] that prior authorization for Repatha '140MG'$ /ML is required/requested   PA submitted on 1.17.24 to (ins) [employer] via CoverMyMeds Key [id number] Status is pending

## 2022-12-30 ENCOUNTER — Ambulatory Visit: Payer: Medicare HMO | Attending: Cardiology | Admitting: Cardiology

## 2022-12-30 ENCOUNTER — Encounter: Payer: Self-pay | Admitting: Cardiology

## 2022-12-30 VITALS — BP 153/75 | HR 64 | Ht 68.0 in | Wt 219.0 lb

## 2022-12-30 DIAGNOSIS — I10 Essential (primary) hypertension: Secondary | ICD-10-CM

## 2022-12-30 DIAGNOSIS — I2510 Atherosclerotic heart disease of native coronary artery without angina pectoris: Secondary | ICD-10-CM | POA: Diagnosis not present

## 2022-12-30 DIAGNOSIS — E782 Mixed hyperlipidemia: Secondary | ICD-10-CM

## 2022-12-30 MED ORDER — CARVEDILOL 12.5 MG PO TABS: 12.5000 mg | ORAL_TABLET | Freq: Two times a day (BID) | ORAL | 3 refills | Status: DC

## 2022-12-30 NOTE — Patient Instructions (Signed)
Medication Instructions:  Your physician has recommended you make the following change in your medication:  -Stop Hydralazine -Stop Toprol XL -Start Coreg 12.5 mg tablets twice daily   Labwork: None  Testing/Procedures: None  Follow-Up: Follow up with Dr. Harl Bowie in 6 months Nurse Visit- 1 week- blood pressure check  Any Other Special Instructions Will Be Listed Below (If Applicable).     If you need a refill on your cardiac medications before your next appointment, please call your pharmacy.

## 2022-12-30 NOTE — Progress Notes (Signed)
Clinical Summary Timothy [name] is a 77 y.o.male seen today for follow up of the following medical problems.    1. HTN  - norvasc that we started last visit caused foot pain, stopped and resolved.  - compliant withmeds - he feels that hydralazine is causing leg cramps        2. Hyperlipidemia - intolerant to statins, has been on zetia only. He reports muscle aches on zetia, now off.  - labs followed by pcp   - 01/2020 TC 206 TG 364 HDL 28 LDL 114 -prior authorization for repatha just approved     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 Echo LVEF 50-55%, inferior hypokinesis, grade I diastolic dysfunction. - admit 11/2016 with NSTEMI. Cath as reported below. Received balloon angioplasty of mid to distal LCX, too small to be stented.  - echo 11/2016 LVEF 40-45%.       - last visit he was doing well without symptoms but was noted to have new inferior and lateral precordial TWIs, and lateral Qwaves - referred for stress test. Jan 2019 nuclear stress small moderate intensity partially reversible mid to apical anterior defect consistent with mild ischemia. LVEF 25% by nuclear, however echo 02/2018 showed LVEF stabel 40-45%.      - no chest pains, no SOB/DOE     07/2022 echo: LVEF 45-50% - no chest pains, no SOB/DOE  4. PAD - 07/2018 ABIs right 1.12, left 0.8 - followed by vascular. Has had left SFA stent.   - Jan 2021 normal ABIs, normal left SFA stent.   - cramping leg both sides bilateral calves, thighs, and buttocks - often occurs at rest. - 4 glasses of water daily,       5. History of bladder cancer - followed by urology, s/p recesection Notes mention low volume prostate cancer as well   AAA screen: neg [place] 2018 Past Medical History:  Diagnosis Date   Arthritis    Bladder cancer ([place])    Blood clot in vein 2018   left   CAD (coronary artery disease)    a. 06/2014: NSTEMI s/p PTCA of LCx, 60-80% residual in distal LAD, 70%  prox small D1.   b. 02/06/15 NSTEMI  s/p successful PCI/DES to mid RCA c. angioplasty alone to LCx in 11/2016   Decreased GFR 45 % per 01-04-2021 labs   Diabetes mellitus, type 2 (HCC)    Elevated PSA    HLD (hyperlipidemia)    HOH (hard of hearing)    Hypertension    NSTEMI (non-ST elevated myocardial infarction) ([place]) 2015   Peripheral vascular disease ([place])    Poor historian    Wears hearing aid in right ear      Allergies  Allergen Reactions   Lipitor [Atorvastatin] Other (See Comments)    Myalgia   Other Other (See Comments)    All Cholesterol Meds - causes muscle and joint pain   Pravastatin Other (See Comments)    Myalgia     Current Outpatient Medications  Medication Sig Dispense Refill   acetaminophen (TYLENOL) 500 MG tablet Take 500 mg by mouth every 6 (six) hours as needed (pain).     aspirin 81 MG chewable tablet Chew 1 tablet (81 mg total) by mouth daily.     glimepiride (AMARYL) 2 MG tablet Take 1 tablet (2 mg total) by mouth 2 (two) times daily. 180 tablet 3   hydrALAZINE (APRESOLINE) 50 MG tablet Take 1 tablet (50  mg total) by mouth in the morning and at bedtime. 180 tablet 3   losartan (COZAAR) 25 MG tablet Take 1 tablet (25 mg total) by mouth daily. 90 tablet 3   metoprolol succinate (TOPROL-XL) 25 MG 24 hr tablet TAKE 3 TABLES TOTAL DAILY 270 tablet 3   TRADJENTA 5 MG TABS tablet Take 1 tablet (5 mg total) by mouth daily. 90 tablet 1   No current facility-administered medications for this visit.     Past Surgical History:  Procedure Laterality Date   ABDOMINAL AORTOGRAM W/LOWER EXTREMITY N/A 08/28/2018   Procedure: ABDOMINAL AORTOGRAM W/LOWER EXTREMITY;  Surgeon: Serafina Mitchell, MD;  Location: [place] [facility];  Service: Cardiovascular;  Laterality: N/A;  bilateral   ABDOMINAL AORTOGRAM W/LOWER EXTREMITY N/A 07/09/2019   Procedure: ABDOMINAL AORTOGRAM W/LOWER EXTREMITY;  Surgeon: Serafina Mitchell, MD;  Location: [place] [facility];  Service:  Cardiovascular;  Laterality: N/A;  unilateral Lt   CARDIAC CATHETERIZATION  02/06/2015   Procedure: CORONARY STENT INTERVENTION;  Surgeon: [name], MD;  Location: [facility] CATH LAB;  Service: Cardiovascular;;  Mid RCA   CARDIAC CATHETERIZATION N/A 11/11/2016   Procedure: Left Heart Cath and Coronary Angiography;  Surgeon: [name], MD;  Location: [place] [facility];  Service: Cardiovascular;  Laterality: N/A;   CARDIAC CATHETERIZATION N/A 11/11/2016   Procedure: Coronary Balloon Angioplasty;  Surgeon: [name], MD;  Location: [place] [facility];  Service: Cardiovascular;  Laterality: N/A;   COLONOSCOPY N/A 01/14/2019   Procedure: COLONOSCOPY;  Surgeon: Rogene Houston, MD;  [facility];  Service: Endoscopy;  Laterality: N/A;  730   CORONARY ANGIOPLASTY  11/11/2016   Mid Cx to Dist Cx lesion, 100 %stenosed. This lesion was treated with balloon angioplasty with a 2.0 balloon   CYSTOSCOPY W/ RETROGRADES N/A 05/05/2015   Procedure: CYSTOSCOPY WITH BILATERAL RETROGRADE PYELOGRAMS AND BLADDER BIOPSIES;  Surgeon: [name], MD;  Location: [facility];  Service: Urology;  Laterality: N/A;   CYSTOSCOPY W/ RETROGRADES Bilateral 07/31/2018   Procedure: CYSTOSCOPY WITH BILATERAL RETROGRADE PYELOGRAM;  Surgeon: [name], MD;  Location: [facility];  Service: Urology;  Laterality: Bilateral;   CYSTOSCOPY W/ RETROGRADES Right 02/15/2021   Procedure: CYSTOSCOPY WITH RETROGRADE PYELOGRAM;  Surgeon: [name], MD;  Location: [facility];  Service: Urology;  Laterality: Right;   CYSTOSCOPY WITH BIOPSY N/A 07/31/2018   Procedure: CYSTOSCOPY WITH BIOPSY;  Surgeon: [name], MD;  Location: [facility];  Service: Urology;  Laterality: N/A;   CYSTOSCOPY WITH BIOPSY N/A 02/15/2021   Procedure: CYSTOSCOPY WITH BIOPSY;  Surgeon: [name], MD;  Location: [facility];  Service: Urology;  Laterality: N/A;   LEFT HEART CATHETERIZATION  WITH CORONARY ANGIOGRAM N/A 06/17/2014   Procedure: LEFT HEART CATHETERIZATION WITH CORONARY ANGIOGRAM;  Surgeon: [name], MD;  Location: [facility] C[facility] LAB;  Service: Cardiovascular;  Laterality: N/A;   LEFT HEART CATHETERIZATION WITH CORONARY ANGIOGRAM N/A 02/06/2015   Procedure: LEFT HEART CATHETERIZATION WITH CORONARY ANGIOGRAM;  Surgeon: [name], MD;  Location: [facility];  Service: Cardiovascular;  Laterality: N/A;   PERIPHERAL VASCULAR ATHERECTOMY  08/28/2018   Procedure: PERIPHERAL VASCULAR ATHERECTOMY;  Surgeon: Serafina Mitchell, MD;  Location: [place] [facility];  Service: Cardiovascular;;  Prox lt.SFA, Distal SFA   PERIPHERAL VASCULAR INTERVENTION  07/09/2019   Procedure: PERIPHERAL VASCULAR INTERVENTION;  Surgeon: Serafina Mitchell, MD;  Location: [place] [facility];  Service: Cardiovascular;;  Lt. SFA   POLYPECTOMY  01/14/2019   Procedure: POLYPECTOMY;  Surgeon: Rogene Houston, MD;  [facility];  Service: Endoscopy;;  colon   PROSTATE BIOPSY N/A 03/31/2014   Procedure: BIOPSY TRANSRECTAL ULTRASONIC PROSTATE (TUBP);  Surgeon: [name], MD;  Location: [facility];  Service: Urology;  Laterality: N/A;   PROSTATE BIOPSY N/A 02/15/2021   Procedure: BIOPSY TRANSRECTAL ULTRASONIC PROSTATE (TUBP);  Surgeon: [name], MD;  Location: [facility];  Service: Urology;  Laterality: N/A;  78 MINS   TRANSURETHRAL RESECTION OF BLADDER TUMOR N/A 03/14/2013   Procedure: TRANSURETHRAL RESECTION OF BLADDER TUMOR (TURBT);  Surgeon: [name], MD;  Location: [facility];  Service: Urology;  Laterality: N/A;  1 HR WITH MITOMYCIN INSTILLATION    TRANSURETHRAL RESECTION OF BLADDER TUMOR N/A 09/30/2013   Procedure: TRANSURETHRAL RESECTION OF BLADDER TUMOR (TURBT);  Surgeon: [name], MD;  Location: [facility];  Service: Urology;  Laterality: N/A;   TRANSURETHRAL RESECTION OF BLADDER TUMOR N/A 03/31/2014    Procedure: TRANSURETHRAL RESECTION OF BLADDER TUMOR (TURBT);  Surgeon: [name], MD;  Location: [facility];  Service: Urology;  Laterality: N/A;   TRANSURETHRAL RESECTION OF BLADDER TUMOR N/A 05/31/2015   Procedure: Cystoscopy with clott evacuation and fulgeration of bleeders, retrograde pylegram;  Surgeon: [name], MD;  Location: [facility];  Service: Urology;  Laterality: N/A;     Allergies  Allergen Reactions   Lipitor [Atorvastatin] Other (See Comments)    Myalgia   Other Other (See Comments)    All Cholesterol Meds - causes muscle and joint pain   Pravastatin Other (See Comments)    Myalgia      Family History  Problem Relation Age of Onset   Cancer Child    Asthma Mother      Social History Mr. Bernhard reports that he quit smoking about 29 years ago. His smoking use included cigarettes. He has a 30.00 pack-year smoking history. He has never used smokeless tobacco. Mr. Jagoda reports no history of alcohol use.   Review of Systems CONSTITUTIONAL: No weight loss, fever, chills, weakness or fatigue.  HEENT: Eyes: No visual loss, blurred vision, double vision or yellow sclerae.No hearing loss, sneezing, congestion, runny nose or sore throat.  SKIN: No rash or itching.  CARDIOVASCULAR:  RESPIRATORY: No shortness of breath, cough or sputum.  GASTROINTESTINAL: No anorexia, nausea, vomiting or diarrhea. No abdominal pain or blood.  GENITOURINARY: No burning on urination, no polyuria NEUROLOGICAL: No headache, dizziness, syncope, paralysis, ataxia, numbness or tingling in the extremities. No change in bowel or bladder control.  MUSCULOSKELETAL: No muscle, back pain, joint pain or stiffness.  LYMPHATICS: No enlarged nodes. No history of splenectomy.  PSYCHIATRIC: No history of depression or anxiety.  ENDOCRINOLOGIC: No reports of sweating, cold or heat intolerance. No polyuria or polydipsia.  Marland Kitchen   Physical Examination There were no vitals  filed for this visit. There were no vitals filed for this visit.  Gen: resting comfortably, no acute distress HEENT: no scleral icterus, pupils equal round and reactive, no palptable cervical adenopathy,  CV Resp: Clear to auscultation bilaterally GI: abdomen is soft, non-tender, non-distended, normal bowel sounds, no hepatosplenomegaly MSK: extremities are warm, no edema.  Skin: warm, no rash Neuro:  no focal deficits Psych: appropriate affect   Diagnostic Studies  06/2014 Cath FINDINGS:   Hemodynamics:   Central Aortic Pressure / Mean: 108/62/83 mmHg Left Ventricular Pressure / LVEDP: 107/7/17 mmHg   Left Ventriculography: EF: ~45% % Wall Motion:  global Hypokinesis   Coronary Anatomy: Dominance: Right Left Main: Large caliber, long trunk that trifurcates into the LAD, Circumflex & Ramus Intermedius. Angiographically normal. LAD: Begins as a large caliber vessel that tapers into a relatively small caliber (~1.5-2.0 mm) vessel distally.  It wraps the apex, perfusing the distal 1/3 of the infero-apex. The distal vessel has tandem 60&70-80% focal lesions prior to the apex. D1: Very small caliber bifurcating vessel with proximal ~70% stenosis.   Left Circumflex: Begins as a large caliber vessel that also tapers to a small to moderate caliber vessel in the AV Groove where it gives off a small caliber OM1 Gaige Sebo and is 100% occluded just beyond that point.    Post-PTCA: beyond the occluded AV Groove Circumflex, there is a small to moderate caliber LPL Cathern Tahir.   Ramus intermedius: Large caliber vessel that bifurcates in the mid-vessel into to small to moderate caliber branches that both reach the apex.    RCA: Large caliber, dominant vessel with diffuse ~20-40% lesions, but no obstructive disease.  It bifurcates distally in to a small caliber, short PDA and Right Posterior AV Groove Jkayla Spiewak (RPAV) branches.  RPAV gives off several very small banches.   After reviewing the initial  angiography, the culprit lesion was thought to be the occluded distal-AV Groove circumflesx.  Preparation were made to proceed with PTCA on this lesion.   Percutaneous Coronary Intervention:   Guide: 6 Fr   CLS 3.5  -- very difficult guide support, catheter moved & disengaged with patient movement Guidewire: BMW (advanced initially in to an atrial Murriel Eidem for support & balloon advanced into proximal Circumflex allowing for further advancmemt of the wire beyond the culprit lesion. Predilation Balloon #1: Emerge 1.5 mm x 12 mm; Used to assist with wire support to advance guidewire 6 Atm x 30 Sec, 8 Atm x 30 Sec Post inflation revealed a small to moderate follow-on circumflex with LPL Viviana Trimble.    Due to the relatively small sized distal vessel, the decision was to perform PTCA only. Balloon #2: Euphora 2.0 mm x 12 mm;   8 Atm x 90 Sec x 2 with IC NTG - no recoil Final Diameter: ~2.0 mm   Post deployment angiography in multiple views, with and without guidewire in place revealed excellent stent deployment and lesion coverage.  There was no evidence of dissection or perforation.   POST-OPERATIVE DIAGNOSIS:   Severe 2 vessel disease in small caliber vessel - distal AV Groove Circumflex 100% and distal LAD ~60&80% lesions (1.5 mm vessel) Successful PTCA of the occluded AV Groove Circumflex with Stent-like result. Moderately reduced LVEF by LV Gram Normal LVEDP     06/2014 Echo Study Conclusions  - Left ventricle: The cavity size was normal. Wall thickness was   normal. Systolic function was normal. The estimated ejection   fraction was in the range of 50% to 55%. Inferior hypokinesis.   Doppler parameters are consistent with abnormal left ventricular   relaxation (grade 1 diastolic dysfunction). The E/e&' ratio is   15, suggesting borderline elevated LV filling pressure. - Aortic valve: Trileaflet. Sclerosis without stenosis. There was   no regurgitation. - Left atrium: The atrium was normal  in size. - Tricuspid valve: There was mild regurgitation. - Pulmonary arteries: PA peak pressure: 33 mm Hg (S).  Impressions:  - LVEF 50-55%, inferior hypokinesis, mild TR, top normal RVSP,   diastolic dysfunction with borderline elevated LV filling   pressure.     11/2016 echo Study Conclusions   -  Left ventricle: The cavity size was normal. Wall thickness was   normal. Systolic function was mildly to moderately reduced. The   estimated ejection fraction was in the range of 40% to 45%. There   is akinesis of the basal-midinferior and inferoseptal myocardium.   Doppler parameters are consistent with abnormal left ventricular   relaxation (grade 1 diastolic dysfunction). - Aortic valve: Trileaflet; mildly thickened, mildly calcified   leaflets. There was trivial regurgitation.   Impressions:   - EF is mildly reduced when compared to prior.     11/2016 cath There is mild left ventricular systolic dysfunction. The left ventricular ejection fraction is 45-50% by visual estimate. Mid RCA-2 lesion, 25 %stenosed. Patent mid RCA stent. Ost 2nd Diag to 2nd Diag lesion, 80 %stenosed. Unchanged from prior. Dist LAD lesion, 60 %stenosed. Unchanged from prior. Mid Cx to Dist Cx lesion, 100 %stenosed. This lesion was treated with balloon angioplasty with a 2.0 balloon. Several inflations were performed. Post intervention, there is a 0% residual stenosis. The vessel was too small to be stented. There is also significant tortuosity in the proximal vessel. LV end diastolic pressure is mildly elevated.   Initially, Brilinta was given for antiplatelet therapy in the Cath Lab. Since he did not receive a stent, we will transition him to clopidogrel.  He needs aggressive secondary prevention including lipid-lowering therapy. He has been intolerant to statins in the past. Would need to consider PCS K9 inhibitor along with other secondary prevention. I anticipate he'll be here 2 days in the hospital.  Echocardiogram would be reasonable to check ejection fraction and mitral apparatus.   LAD and diagonal disease was not addressed since they appeared unchanged angiographically. If he has refractory angina, could address the LAD lesion at a later time.   Jan 2018 AAA [place] No AAA     Jan 2019 nuclear stress No diagnostic ST segment changes. Occasional PVCs noted throughout the study. Hypertensive response. Technically low risk Duke treadmill score 4.5. Blood pressure demonstrated a hypertensive response to exercise. Small, moderate intensity, partially reversible mid to apical anterior defect consistent with ischemia. This is a high risk study predominantly based on calculated LVEF. Consider echocardiogram for further confirmation. Nuclear stress EF: 25%.     02/2018 echo Study Conclusions   - Left ventricle: Mid and basal inferior wall hypokinesis. The   cavity size was mildly dilated. Wall thickness was normal.   Systolic function was mildly to moderately reduced. The estimated   ejection fraction was in the range of 40% to 45%. - Aortic valve: There was trivial regurgitation. Valve area (VTI):   1.51 cm^2. Valve area (Vmax): 1.75 cm^2. Valve area (Vmean): 1.64   cm^2. - Atrial septum: No defect or patent foramen ovale was identified. - Pulmonary arteries: PA peak pressure: 37 mm Hg (S).   07/2022 echo 1. Left ventricular ejection fraction, by estimation, is 45 to 50%. The  left ventricle has mildly decreased function. The left ventricle has no  regional wall motion abnormalities. Left ventricular diastolic parameters  are indeterminate. The average left   ventricular global longitudinal strain is -17.3 %.   2. Right ventricular systolic function is normal. The right ventricular  size is normal. Tricuspid regurgitation signal is inadequate for assessing  PA pressure.   3. The mitral valve is normal in structure. Mild mitral valve  regurgitation. No evidence of mitral stenosis.    4. The tricuspid valve is abnormal.   5. The aortic valve is tricuspid. There is mild  calcification of the  aortic valve. There is mild thickening of the aortic valve. Aortic valve  regurgitation is mild. Aortic valve sclerosis/calcification is present,  without any evidence of aortic  stenosis.   6. The inferior vena cava is normal in size with greater than 50%  respiratory variability, suggesting right atrial pressure of 3 mmHg.   Assessment and Plan   1. HTN - he feels that hydralazine is causing leg cramps. Listed side effect though uncommon. We will try stopping for now, change toprol to coreg. Nursing visit 1 week, if cramps not improved likely restart hydralazine   2. Hyperlipidemia - history of CAD and PAD - intolerant to statins and zetia.  - repatha just approved   3. CAD/ICM - no recent symptoms. Medical therapy limited by renal dysfunction and mixed compliance.  - continue current meds   4. PAD - followed by vascular     Arnoldo Lenis, M.D

## 2023-01-03 MED ORDER — REPATHA SURECLICK 140 MG/ML ~~LOC~~ SOAJ: 1.0000 mL | SUBCUTANEOUS | 11 refills | Status: DC

## 2023-01-03 NOTE — Telephone Encounter (Signed)
Repatha Rx sent to pharmacy

## 2023-01-06 ENCOUNTER — Ambulatory Visit: Payer: Medicare HMO | Attending: Internal Medicine

## 2023-01-06 VITALS — BP 142/76 | HR 64

## 2023-01-06 DIAGNOSIS — Z0130 Encounter for examination of blood pressure without abnormal findings: Secondary | ICD-10-CM

## 2023-01-06 NOTE — Progress Notes (Signed)
Patient came for nurse visit BP check after stopping hydralazine for leg cramps. BP 142/46, HR 64  He states his legs feel like "new born babies", with no c/o cramping.  He stopped toprol and is taking coreg 12.5 mg bid     I will forward to [place] for review.

## 2023-01-10 ENCOUNTER — Ambulatory Visit: Payer: Medicare HMO | Admitting: Nurse Practitioner

## 2023-01-13 DIAGNOSIS — E119 Type 2 diabetes mellitus without complications: Secondary | ICD-10-CM | POA: Diagnosis not present

## 2023-01-13 DIAGNOSIS — E6609 Other obesity due to excess calories: Secondary | ICD-10-CM | POA: Diagnosis not present

## 2023-01-13 DIAGNOSIS — Z6834 Body mass index (BMI) 34.0-34.9, adult: Secondary | ICD-10-CM | POA: Diagnosis not present

## 2023-01-13 DIAGNOSIS — E1159 Type 2 diabetes mellitus with other circulatory complications: Secondary | ICD-10-CM | POA: Diagnosis not present

## 2023-02-27 DIAGNOSIS — M545 Low back pain, unspecified: Secondary | ICD-10-CM | POA: Diagnosis not present

## 2023-03-09 ENCOUNTER — Ambulatory Visit: Payer: Medicare HMO | Admitting: Nurse Practitioner

## 2023-03-09 ENCOUNTER — Encounter: Payer: Self-pay | Admitting: Nurse Practitioner

## 2023-03-09 VITALS — BP 134/82 | HR 59 | Ht 68.0 in | Wt 218.0 lb

## 2023-03-09 DIAGNOSIS — N1831 Chronic kidney disease, stage 3a: Secondary | ICD-10-CM | POA: Diagnosis not present

## 2023-03-09 DIAGNOSIS — E1122 Type 2 diabetes mellitus with diabetic chronic kidney disease: Secondary | ICD-10-CM | POA: Diagnosis not present

## 2023-03-09 DIAGNOSIS — I10 Essential (primary) hypertension: Secondary | ICD-10-CM

## 2023-03-09 DIAGNOSIS — E782 Mixed hyperlipidemia: Secondary | ICD-10-CM | POA: Diagnosis not present

## 2023-03-09 LAB — POCT GLYCOSYLATED HEMOGLOBIN (HGB A1C): Hemoglobin A1C: 8.1 % — AB (ref 4.0–5.6)

## 2023-03-09 MED ORDER — GLIMEPIRIDE 2 MG PO TABS: 2.0000 mg | ORAL_TABLET | Freq: Two times a day (BID) | ORAL | 3 refills | Status: DC

## 2023-03-09 MED ORDER — TRADJENTA 5 MG PO TABS: 5.0000 mg | ORAL_TABLET | Freq: Every day | ORAL | 1 refills | Status: DC

## 2023-03-09 MED ORDER — EMPAGLIFLOZIN 10 MG PO TABS: 10.0000 mg | ORAL_TABLET | Freq: Every day | ORAL | 1 refills | Status: DC

## 2023-03-09 NOTE — Progress Notes (Signed)
Endocrinology Follow Up Note       03/09/2023, 12:47 PM   Subjective:    Patient ID: Timothy [name], male    DOB: 01-05-1946.  Brack Bailin is being seen in follow up after being seen in consultation for management of currently uncontrolled symptomatic diabetes requested by  Redmond School, MD.   Past Medical History:  Diagnosis Date   Arthritis    Bladder cancer    Blood clot in vein 2018   left   CAD (coronary artery disease)    a. 06/2014: NSTEMI s/p PTCA of LCx, 60-80% residual in distal LAD, 70% prox small D1.   b. 02/06/15 NSTEMI  s/p successful PCI/DES to mid RCA c. angioplasty alone to LCx in 11/2016   Decreased GFR 45 % per 01-04-2021 labs   Diabetes mellitus, type 2    Elevated PSA    HLD (hyperlipidemia)    HOH (hard of hearing)    Hypertension    NSTEMI (non-ST elevated myocardial infarction) 2015   Peripheral vascular disease    Poor historian    Wears hearing aid in right ear     Past Surgical History:  Procedure Laterality Date   ABDOMINAL AORTOGRAM W/LOWER EXTREMITY N/A 08/28/2018   Procedure: ABDOMINAL AORTOGRAM W/LOWER EXTREMITY;  Surgeon: Serafina Mitchell, MD;  Location: [facility];  Service: Cardiovascular;  Laterality: N/A;  bilateral   ABDOMINAL AORTOGRAM W/LOWER EXTREMITY N/A 07/09/2019   Procedure: ABDOMINAL AORTOGRAM W/LOWER EXTREMITY;  Surgeon: Serafina Mitchell, MD;  Location: [facility];  Service: Cardiovascular;  Laterality: N/A;  unilateral Lt   CARDIAC CATHETERIZATION  02/06/2015   Procedure: CORONARY STENT INTERVENTION;  Surgeon: [name], MD;  Location: [facility];  Service: Cardiovascular;;  Mid RCA   CARDIAC CATHETERIZATION N/A 11/11/2016   Procedure: Left Heart Cath and Coronary Angiography;  Surgeon: [name], MD;  Location: [facility];  Service: Cardiovascular;  Laterality: N/A;   CARDIAC CATHETERIZATION N/A 11/11/2016    Procedure: Coronary Balloon Angioplasty;  Surgeon: [name], MD;  Location: [facility];  Service: Cardiovascular;  Laterality: N/A;   COLONOSCOPY N/A 01/14/2019   Procedure: COLONOSCOPY;  Surgeon: Rogene Houston, MD;  [facility];  Service: Endoscopy;  Laterality: N/A;  730   CORONARY ANGIOPLASTY  11/11/2016   Mid Cx to Dist Cx lesion, 100 %stenosed. This lesion was treated with balloon angioplasty with a 2.0 balloon   CYSTOSCOPY W/ RETROGRADES N/A 05/05/2015   Procedure: CYSTOSCOPY WITH BILATERAL RETROGRADE PYELOGRAMS AND BLADDER BIOPSIES;  Surgeon: [name], MD;  Location: [facility];  Service: Urology;  Laterality: N/A;   CYSTOSCOPY W/ RETROGRADES Bilateral 07/31/2018   Procedure: CYSTOSCOPY WITH BILATERAL RETROGRADE PYELOGRAM;  Surgeon: [name], MD;  Location: [facility];  Service: Urology;  Laterality: Bilateral;   CYSTOSCOPY W/ RETROGRADES Right 02/15/2021   Procedure: CYSTOSCOPY WITH RETROGRADE PYELOGRAM;  Surgeon: [name], MD;  Location: [facility];  Service: Urology;  Laterality: Right;   CYSTOSCOPY WITH BIOPSY N/A 07/31/2018   Procedure: CYSTOSCOPY WITH BIOPSY;  Surgeon: [name], MD;  Location: [facility];  Service: Urology;  Laterality: N/A;   CYSTOSCOPY WITH  BIOPSY N/A 02/15/2021   Procedure: CYSTOSCOPY WITH BIOPSY;  Surgeon: [name], MD;  Location: [facility];  Service: Urology;  Laterality: N/A;   LEFT HEART CATHETERIZATION WITH CORONARY ANGIOGRAM N/A 06/17/2014   Procedure: LEFT HEART CATHETERIZATION WITH CORONARY ANGIOGRAM;  Surgeon: [name], MD;  Location: [facility];  Service: Cardiovascular;  Laterality: N/A;   LEFT HEART CATHETERIZATION WITH CORONARY ANGIOGRAM N/A 02/06/2015   Procedure: LEFT HEART CATHETERIZATION WITH CORONARY ANGIOGRAM;  Surgeon: [name], MD;  Location: [facility] CATH LAB;  Service: Cardiovascular;  Laterality: N/A;   PERIPHERAL VASCULAR ATHERECTOMY   08/28/2018   Procedure: PERIPHERAL VASCULAR ATHERECTOMY;  Surgeon: Serafina Mitchell, MD;  Location: [facility];  Service: Cardiovascular;;  Prox lt.SFA, Distal SFA   PERIPHERAL VASCULAR INTERVENTION  07/09/2019   Procedure: PERIPHERAL VASCULAR INTERVENTION;  Surgeon: Serafina Mitchell, MD;  Location: [facility];  Service: Cardiovascular;;  Lt. SFA   POLYPECTOMY  01/14/2019   Procedure: POLYPECTOMY;  Surgeon: Rogene Houston, MD;  [facility];  Service: Endoscopy;;  colon   PROSTATE BIOPSY N/A 03/31/2014   Procedure: BIOPSY TRANSRECTAL ULTRASONIC PROSTATE (TUBP);  Surgeon: [name], MD;  Location: [facility];  Service: Urology;  Laterality: N/A;   PROSTATE BIOPSY N/A 02/15/2021   Procedure: BIOPSY TRANSRECTAL ULTRASONIC PROSTATE (TUBP);  Surgeon: [name], MD;  Location: [facility];  Service: Urology;  Laterality: N/A;  68 MINS   TRANSURETHRAL RESECTION OF BLADDER TUMOR N/A 03/14/2013   Procedure: TRANSURETHRAL RESECTION OF BLADDER TUMOR (TURBT);  Surgeon: [name], MD;  Location: [facility];  Service: Urology;  Laterality: N/A;  1 HR WITH MITOMYCIN INSTILLATION    TRANSURETHRAL RESECTION OF BLADDER TUMOR N/A 09/30/2013   Procedure: TRANSURETHRAL RESECTION OF BLADDER TUMOR (TURBT);  Surgeon: [name], MD;  Location: [facility];  Service: Urology;  Laterality: N/A;   TRANSURETHRAL RESECTION OF BLADDER TUMOR N/A 03/31/2014   Procedure: TRANSURETHRAL RESECTION OF BLADDER TUMOR (TURBT);  Surgeon: [name], MD;  Location: [facility];  Service: Urology;  Laterality: N/A;   TRANSURETHRAL RESECTION OF BLADDER TUMOR N/A 05/31/2015   Procedure: Cystoscopy with clott evacuation and fulgeration of bleeders, retrograde pylegram;  Surgeon: [name], MD;  Location: [facility];  Service: Urology;  Laterality: N/A;    Social History   Socioeconomic History    Marital status: Married    Spouse name: Not on file   Number of children: Not on file   Years of education: Not on file   Highest education level: Not on file  Occupational History   Not on file  Tobacco Use   Smoking status: Former    Packs/day: 1.00    Years: 30.00    Additional pack years: 0.00    Total pack years: 30.00    Types: Cigarettes    Quit date: 03/11/1993    Years since quitting: 30.0   Smokeless tobacco: Never  Vaping Use   Vaping Use: Never used  Substance and Sexual Activity   Alcohol use: No    Alcohol/week: 0.0 standard drinks of alcohol   Drug use: No   Sexual activity: Not Currently  Other Topics Concern   Not on file  Social History Narrative   Not on file   Social Determinants of Health   Financial Resource Strain: Low Risk  (11/14/2022)   Overall Financial Resource Strain (CARDIA)    Difficulty of Paying Living Expenses: Not  very hard  Food Insecurity: No Food Insecurity (11/14/2022)   Hunger Vital Sign    Worried About Running Out of Food in the Last Year: Never true    Ran Out of Food in the Last Year: Never true  Transportation Needs: No Transportation Needs (11/14/2022)   PRAPARE - Hydrologist (Medical): No    Lack of Transportation (Non-Medical): No  Physical Activity: Not on file  Stress: Not on file  Social Connections: Not on file    Family History  Problem Relation Age of Onset   Cancer Child    Asthma Mother     Outpatient Encounter Medications as of 03/09/2023  Medication Sig   acetaminophen (TYLENOL) 500 MG tablet Take 500 mg by mouth every 6 (six) hours as needed (pain).   aspirin 81 MG chewable tablet Chew 1 tablet (81 mg total) by mouth daily.   carvedilol (COREG) 12.5 MG tablet Take 1 tablet (12.5 mg total) by mouth in the morning and at bedtime.   empagliflozin (JARDIANCE) 10 MG TABS tablet Take 1 tablet (10 mg total) by mouth daily before breakfast.   Evolocumab (REPATHA SURECLICK) [id number] MG/ML  SOAJ Inject 140 mg into the skin every 14 (fourteen) days.   losartan (COZAAR) 25 MG tablet Take 1 tablet (25 mg total) by mouth daily.   [DISCONTINUED] glimepiride (AMARYL) 2 MG tablet Take 1 tablet (2 mg total) by mouth 2 (two) times daily.   [DISCONTINUED] TRADJENTA 5 MG TABS tablet Take 1 tablet (5 mg total) by mouth daily.   glimepiride (AMARYL) 2 MG tablet Take 1 tablet (2 mg total) by mouth 2 (two) times daily.   TRADJENTA 5 MG TABS tablet Take 1 tablet (5 mg total) by mouth daily.   [DISCONTINUED] metFORMIN (GLUCOPHAGE) 500 MG tablet Take 500 mg by mouth 2 (two) times daily. (Patient not taking: Reported on 03/09/2023)   No facility-administered encounter medications on file as of 03/09/2023.    ALLERGIES: Allergies  Allergen Reactions   Lipitor [Atorvastatin] Other (See Comments)    Myalgia   Other Other (See Comments)    All Cholesterol Meds - causes muscle and joint pain   Pravastatin Other (See Comments)    Myalgia    VACCINATION STATUS: Immunization History  Administered Date(s) Administered   Influenza, High Dose Seasonal PF 08/29/2017, 08/16/2018   Influenza-Unspecified 09/08/2014    Diabetes He presents for his follow-up diabetic visit. He has type 2 diabetes mellitus. Onset time: diagnosed at approx age of 42. His disease course has been worsening. There are no hypoglycemic associated symptoms. There are no diabetic associated symptoms. There are no hypoglycemic complications. Diabetic complications include nephropathy. Risk factors for coronary artery disease include diabetes mellitus, dyslipidemia, family history, male sex, obesity, hypertension and sedentary lifestyle. Current diabetic treatment includes oral agent (dual therapy) (Tradjenta 5 mg and Glimepiride 2 mg po BID). He is compliant with treatment most of the time. His weight is fluctuating minimally. He is following a generally unhealthy diet. When asked about meal planning, he reported none. He has not had a  previous visit with a dietitian. He rarely participates in exercise. His home blood glucose trend is fluctuating minimally. His breakfast blood glucose range is generally >200 mg/dl. (He presents today with no meter or logs to review.  His POCT A1c today is 8.1%, increasing from last visit.  He notes he stopped his Metformin and Glimepiride between visits and he was having upset stomach.  He has only been  taking the Tradjenta since last visit.   He notes his glucose was over 240 this morning.) An ACE inhibitor/angiotensin II receptor blocker is not being taken. He sees a podiatrist.Eye exam is current.     Review of systems  Constitutional: + steadily decreasing body weight, current Body mass index is 33.15 kg/m., no fatigue, + subjective hyperthermia, no subjective hypothermia Eyes: no blurry vision, no xerophthalmia ENT: no sore throat, no nodules palpated in throat, no dysphagia/odynophagia, no hoarseness Cardiovascular: no chest pain, no shortness of breath, no palpitations, no leg swelling Respiratory: no cough, no shortness of breath Gastrointestinal: no nausea/vomiting/diarrhea Genitourinary: + polyuria Musculoskeletal: no muscle/joint aches Skin: no rashes, no hyperemia Neurological: no tremors, no numbness, no tingling, no dizziness Psychiatric: no depression, no anxiety  Objective:     BP 134/82 (BP Location: Right Arm, Patient Position: Sitting, Cuff Size: Large) Comment: Recheck with manuel cuff  Pulse (!) 59   Ht 5\' 8"  (1.727 m)   Wt 218 lb (98.9 kg)   BMI 33.15 kg/m   Wt Readings from Last 3 Encounters:  03/09/23 218 lb (98.9 kg)  12/30/22 219 lb (99.3 kg)  11/30/22 220 lb (99.8 kg)     BP Readings from Last 3 Encounters:  03/09/23 134/82  01/06/23 (!) 142/76  12/30/22 (!) 153/75     Physical Exam- Limited  Constitutional:  Body mass index is 33.15 kg/m. , not in acute distress, normal state of mind, HOH wears bilateral hearing aids Eyes:  EOMI, no  exophthalmos Musculoskeletal: no gross deformities, strength intact in all four extremities, no gross restriction of joint movements Skin:  no rashes, no hyperemia Neurological: no tremor with outstretched hands   Diabetic Foot Exam - Simple   No data filed     CMP ( most recent) CMP     Component Value Date/Time   NA 143 10/21/2022 1001   K 4.9 10/21/2022 1001   CL 104 10/21/2022 1001   CO2 20 10/21/2022 1001   GLUCOSE 183 (H) 10/21/2022 1001   GLUCOSE 193 (H) 02/15/2021 0701   BUN 17 10/21/2022 1001   CREATININE 1.44 (H) 10/21/2022 1001   CREATININE 1.43 (H) 02/25/2020 1148   CALCIUM 9.6 10/21/2022 1001   PROT 7.2 11/10/2016 2351   ALBUMIN 4.2 11/10/2016 2351   AST 32 11/10/2016 2351   ALT 20 11/10/2016 2351   ALKPHOS 47 11/10/2016 2351   BILITOT 0.5 11/10/2016 2351   GFRNONAA 45 (L) 01/04/2021 0953   GFRAA 47 (L) 07/09/2019 0801     Diabetic Labs (most recent): Lab Results  Component Value Date   HGBA1C 8.1 (A) 03/09/2023   HGBA1C 7.9 08/23/2022   HGBA1C 7.2 (H) 07/30/2018   MICROALBUR 30 mg'l 10/10/2022     Lipid Panel ( most recent) Lipid Panel     Component Value Date/Time   CHOL 138 11/12/2016 0101   TRIG 372 (A) 08/23/2022 0000   HDL 25 (L) 11/12/2016 0101   CHOLHDL 5.5 11/12/2016 0101   VLDL 39 11/12/2016 0101   LDLCALC 115 08/23/2022 0000      Lab Results  Component Value Date   TSH 1.17 08/23/2022   TSH 1.805 02/06/2015           Assessment & Plan:   1) Type 2 diabetes mellitus with stage 3a chronic kidney disease, without long-term current use of insulin ([place])  He presents today with no meter or logs to review.  His POCT A1c today is 8.1%, increasing from last visit.  He notes he stopped his Metformin and Glimepiride between visits and he was having upset stomach.  He has only been taking the Tradjenta since last visit.   He notes his glucose was over 240 this morning.  - Jovann Livers has currently uncontrolled symptomatic  type 2 DM since 77 years of age.   -Recent labs reviewed.  - I had a long discussion with him about the progressive nature of diabetes and the pathology behind its complications. -his diabetes is complicated by mild CKD and he remains at a high risk for more acute and chronic complications which include CAD, CVA, CKD, retinopathy, and neuropathy. These are all discussed in detail with him.  The following Lifestyle Medicine recommendations according to [facility]) were discussed and offered to patient and he agrees to start the journey:  A. Whole Foods, Plant-based plate comprising of fruits and vegetables, plant-based proteins, whole-grain carbohydrates was discussed in detail with the patient.   A list for source of those nutrients were also provided to the patient.  Patient will use only water or unsweetened tea for hydration. B.  The need to stay away from risky substances including alcohol, smoking; obtaining 7 to 9 hours of restorative sleep, at least 150 minutes of moderate intensity exercise weekly, the importance of healthy social connections,  and stress reduction techniques were discussed. C.  A full color page of  Calorie density of various food groups per pound showing examples of each food groups was provided to the patient.  - Nutritional counseling repeated at each appointment due to patients tendency to fall back in to old habits.  - The patient admits there is a room for improvement in their diet and drink choices. -  Suggestion is made for the patient to avoid simple carbohydrates from their diet including Cakes, Sweet Desserts / Pastries, Ice Cream, Soda (diet and regular), Sweet Tea, Candies, Chips, Cookies, Sweet Pastries, Store Bought Juices, Alcohol in Excess of 1-2 drinks a day, Artificial Sweeteners, Coffee Creamer, and "Sugar-free" Products. This will help patient to have stable blood glucose profile and potentially avoid unintended weight  gain.   - I encouraged the patient to switch to unprocessed or minimally processed complex starch and increased protein intake (animal or plant source), fruits, and vegetables.   - Patient is advised to stick to a routine mealtimes to eat 3 meals a day and avoid unnecessary snacks (to snack only to correct hypoglycemia).  - I have approached him with the following individualized plan to manage his diabetes and patient agrees:   - he is advised to continue Glimepiride 2 mg po twice daily with meals, therapeutically suitable for patient (not likely causing diarrhea).  I did continue his Tradjenta 5 mg po daily today and also added low dose Jardiance 10 mg po daily.  We went over possible side effects to watch for.  -he is encouraged to start monitoring glucose twice daily, before breakfast and before bed, and to call the clinic if he has readings less than 70 or above 300 for 3 tests in a row.  - Adjustment parameters are given to him for hypo and hyperglycemia in writing. - he is encouraged to call clinic for blood glucose levels less than 70 or above 300 mg /dl.  - Specific targets for  A1c; LDL, HDL, and Triglycerides were discussed with the patient.  2) Blood Pressure /Hypertension:  his blood pressure is controlled to target.   he is advised to continue  his current medications including Hydralazine 75 mg p.o. TID and Metoprolol 25 mg po daily.  3) Lipids/Hyperlipidemia:    Review of his recent lipid panel from 08/23/22 showed uncontrolled LDL at 115 and elevated triglycerides of 372.  He is statin intolerant says his PCP was going to start him on Repatha but has not heard about it yet.  4)  Weight/Diet:  his Body mass index is 33.15 kg/m.  -  clearly complicating his diabetes care.   he is a candidate for weight loss. I discussed with him the fact that loss of 5 - 10% of his  current body weight will have the most impact on his diabetes management.  Exercise, and detailed carbohydrates  information provided  -  detailed on discharge instructions.  5) Chronic Care/Health Maintenance: -he is not on ACEI/ARB and is intolerant to Statin medications and is encouraged to initiate and continue to follow up with Ophthalmology, Dentist, Podiatrist at least yearly or according to recommendations, and advised to stay away from smoking. I have recommended yearly flu vaccine and pneumonia vaccine at least every 5 years; moderate intensity exercise for up to 150 minutes weekly; and sleep for at least 7 hours a day.  - he is advised to maintain close follow up with Redmond School, MD for primary care needs, as well as his other providers for optimal and coordinated care.     I spent  49  minutes in the care of the patient today including review of labs from [place], Lipids, Thyroid Function, Hematology (current and previous including abstractions from other facilities); face-to-face time discussing  his blood glucose readings/logs, discussing hypoglycemia and hyperglycemia episodes and symptoms, medications doses, his options of short and long term treatment based on the latest standards of care / guidelines;  discussion about incorporating lifestyle medicine;  and documenting the encounter. Risk reduction counseling performed per USPSTF guidelines to reduce obesity and cardiovascular risk factors.     Please refer to Patient Instructions for Blood Glucose Monitoring and Insulin/Medications Dosing Guide"  in media tab for additional information. Please  also refer to " Patient Self Inventory" in the Media  tab for reviewed elements of pertinent patient history.  Janan Halter participated in the discussions, expressed understanding, and voiced agreement with the above plans.  All questions were answered to his satisfaction. he is encouraged to contact clinic should he have any questions or concerns prior to his return visit.     Follow up plan: - Return in about 3 months (around 06/08/2023) for  Diabetes F/U with A1c in office, No previsit labs, Bring meter and logs.  Rayetta Pigg, [facility], [facility] [address] [place], [place] [address] Phone: [phone] Fax: [phone]  03/09/2023, 12:47 PM

## 2023-03-13 NOTE — Progress Notes (Signed)
History of Present Illness:      [name] presents for the following issues   BPH:    Timothy [name] is a 77 year-old male established patient who is here for follow up regarding further evaluation of BPH and lower urinary tract symptoms.   Patient is currently treated with Alfuzosin for his symptoms. His symptoms have been stable over the last year.    He is on an alpha-blocker for BPH.    4.9.2024:   Bladder Cancer:   Initial TURBT on 4.10.2014. He had papillary, low-grade, NMIBC. He eventually was found to have recurrence within 6 months, and underwent repeat TURBT on 10.27.2014. Again, this was low-grade, NMIBC. Because of his recurrence in a short period of time, despite using mitomycin after his first and second treatments, he underwent BCG induction, which was completed in January, 2014. He did have a significant inflammatory response to the mitomycin.    In April, 2015 at routine follow-up, he was found to have 2 recurrences, one approximately 1.5 and 1 approximately 1.0 cm. He underwent TURBT on 4.27.2015. This revealed high-grade NMIBC. He completed a second BCG induction course in early August, 2015. He subsequently underwent cystoscopy, bilateral retrogrades, and bladder biopsy on 5.3.2016. Biopsies of the bladder were negative. Retrograde studies were normal. He has had significant dysuria during his treatments. He has completed 3 maintenance BCG treatments, the last of which was 11.29.2016.    Because of the patient's significant side effects from his BCG i.e. dysuria, bladder pain, we stopped maintenance therapy after his 11.29.2016 dose.    8.26.2019: Because of positive cytologies, he underwent cystoscopy, bilateral retrogrades/renal washings, bladder barbotage and bladder biopsy. Bladder washings were negative, barbotage specimen negative, biopsies revealed only benign/inflammatory change.     3.14.2022: Repeat anesthetic cystoscopy and biopsy revealed benign  urothelial changes with chronic inflammation.  No carcinoma identified.      Prostate Cancer:   He is participating in active surveillance.    Because of a rising PSA, he underwent a concurrent transrectal ultrasound and biopsy of his prostate on 4.27.2015. Prostatic volume was 53.55 cm. PSA was 9.13. PSA density 0.17. 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 That biopsy was left base lateral, and less than 5% of the core was involved. There was no granulomatous reaction noted. He underwent repeat/surveillance biopsy on 1.8.2015. Prostatic volume was measured at 53.5 mL. All 12 cores returned negative for adenocarcinoma, with 1 core in the right apex revealing chronic granulomatous inflammation.    2.11.2020: PSA 8.7  3.23.2021: PSA 6.2 11.2.2021: PSA 8.0   3.14.2022: Repeat ultrasound and biopsy of the prostate.  No adenocarcinoma found.  Most cores had focal inflammation and granulomas.   5.9.2023: PSA 5.4     4.9.2024: Here for first visit in a year.  He denies any real urinary issues other than occasional dysuria.  No blood in the urine.  No recent PSA. Past Medical History:  Diagnosis Date   Arthritis    Bladder cancer    Blood clot in vein 2018   left   CAD (coronary artery disease)    a. 06/2014: NSTEMI s/p PTCA of LCx, 60-80% residual in distal LAD, 70% prox small D1.   b. 02/06/15 NSTEMI  s/p successful PCI/DES to mid RCA c. angioplasty alone to LCx in 11/2016   Decreased GFR 45 % per 01-04-2021 labs   Diabetes mellitus, type 2    Elevated PSA    HLD (hyperlipidemia)    HOH (hard of hearing)  Hypertension    NSTEMI (non-ST elevated myocardial infarction) 2015   Peripheral vascular disease    Poor historian    Wears hearing aid in right ear     Past Surgical History:  Procedure Laterality Date   ABDOMINAL AORTOGRAM W/LOWER EXTREMITY N/A 08/28/2018   Procedure: ABDOMINAL AORTOGRAM W/LOWER EXTREMITY;  Surgeon: Nada Libman, MD;  Location: [facility];  Service:  Cardiovascular;  Laterality: N/A;  bilateral   ABDOMINAL AORTOGRAM W/LOWER EXTREMITY N/A 07/09/2019   Procedure: ABDOMINAL AORTOGRAM W/LOWER EXTREMITY;  Surgeon: Nada Libman, MD;  Location: [facility];  Service: Cardiovascular;  Laterality: N/A;  unilateral Lt   CARDIAC CATHETERIZATION  02/06/2015   Procedure: CORONARY STENT INTERVENTION;  Surgeon: Marykay Lex, MD;  Location: [facility] CATH LAB;  Service: Cardiovascular;;  Mid RCA   CARDIAC CATHETERIZATION N/A 11/11/2016   Procedure: Left Heart Cath and Coronary Angiography;  Surgeon: Corky Crafts, MD;  Location: [facility] INVASIVE CV LAB;  Service: Cardiovascular;  Laterality: N/A;   CARDIAC CATHETERIZATION N/A 11/11/2016   Procedure: Coronary Balloon Angioplasty;  Surgeon: Corky Crafts, MD;  Location: [facility] INVASIVE CV LAB;  Service: Cardiovascular;  Laterality: N/A;   COLONOSCOPY N/A 01/14/2019   Procedure: COLONOSCOPY;  Surgeon: Malissa Hippo, MD;  Location: [facility];  Service: Endoscopy;  Laterality: N/A;  730   CORONARY ANGIOPLASTY  11/11/2016   Mid Cx to Dist Cx lesion, 100 %stenosed. This lesion was treated with balloon angioplasty with a 2.0 balloon   CYSTOSCOPY W/ RETROGRADES N/A 05/05/2015   Procedure: CYSTOSCOPY WITH BILATERAL RETROGRADE PYELOGRAMS AND BLADDER BIOPSIES;  Surgeon: Marcine Matar, MD;  Location: [facility];  Service: Urology;  Laterality: N/A;   CYSTOSCOPY W/ RETROGRADES Bilateral 07/31/2018   Procedure: CYSTOSCOPY WITH BILATERAL RETROGRADE PYELOGRAM;  Surgeon: Marcine Matar, MD;  Location: [facility];  Service: Urology;  Laterality: Bilateral;   CYSTOSCOPY W/ RETROGRADES Right 02/15/2021   Procedure: CYSTOSCOPY WITH RETROGRADE PYELOGRAM;  Surgeon: Marcine Matar, MD;  Location: [facility];  Service: Urology;  Laterality: Right;   CYSTOSCOPY WITH BIOPSY N/A 07/31/2018   Procedure: CYSTOSCOPY WITH BIOPSY;  Surgeon: Marcine Matar, MD;  Location: [facility];  Service: Urology;  Laterality:  N/A;   CYSTOSCOPY WITH BIOPSY N/A 02/15/2021   Procedure: CYSTOSCOPY WITH BIOPSY;  Surgeon: Marcine Matar, MD;  Location: [facility];  Service: Urology;  Laterality: N/A;   LEFT HEART CATHETERIZATION WITH CORONARY ANGIOGRAM N/A 06/17/2014   Procedure: LEFT HEART CATHETERIZATION WITH CORONARY ANGIOGRAM;  Surgeon: Marykay Lex, MD;  Location: [facility] CATH LAB;  Service: Cardiovascular;  Laterality: N/A;   LEFT HEART CATHETERIZATION WITH CORONARY ANGIOGRAM N/A 02/06/2015   Procedure: LEFT HEART CATHETERIZATION WITH CORONARY ANGIOGRAM;  Surgeon: Marykay Lex, MD;  Location: [facility] CATH LAB;  Service: Cardiovascular;  Laterality: N/A;   PERIPHERAL VASCULAR ATHERECTOMY  08/28/2018   Procedure: PERIPHERAL VASCULAR ATHERECTOMY;  Surgeon: Nada Libman, MD;  Location: [facility];  Service: Cardiovascular;;  Prox lt.SFA, Distal SFA   PERIPHERAL VASCULAR INTERVENTION  07/09/2019   Procedure: PERIPHERAL VASCULAR INTERVENTION;  Surgeon: Nada Libman, MD;  Location: [facility];  Service: Cardiovascular;;  Lt. SFA   POLYPECTOMY  01/14/2019   Procedure: POLYPECTOMY;  Surgeon: Malissa Hippo, MD;  [facility];  Service: Endoscopy;;  colon   PROSTATE BIOPSY N/A 03/31/2014   Procedure: BIOPSY TRANSRECTAL ULTRASONIC PROSTATE (TUBP);  Surgeon: Marcine Matar, MD;  Location: [facility];  Service:  Urology;  Laterality: N/A;   PROSTATE BIOPSY N/A 02/15/2021   Procedure: BIOPSY TRANSRECTAL ULTRASONIC PROSTATE (TUBP);  Surgeon: Marcine Matar, MD;  Location: [facility];  Service: Urology;  Laterality: N/A;  45 MINS   TRANSURETHRAL RESECTION OF BLADDER TUMOR N/A 03/14/2013   Procedure: TRANSURETHRAL RESECTION OF BLADDER TUMOR (TURBT);  Surgeon: Marcine Matar, MD;  Location: [facility];  Service: Urology;  Laterality: N/A;  1 HR WITH MITOMYCIN INSTILLATION    TRANSURETHRAL RESECTION OF BLADDER TUMOR N/A 09/30/2013    Procedure: TRANSURETHRAL RESECTION OF BLADDER TUMOR (TURBT);  Surgeon: Marcine Matar, MD;  Location: [facility];  Service: Urology;  Laterality: N/A;   TRANSURETHRAL RESECTION OF BLADDER TUMOR N/A 03/31/2014   Procedure: TRANSURETHRAL RESECTION OF BLADDER TUMOR (TURBT);  Surgeon: Marcine Matar, MD;  Location: [facility];  Service: Urology;  Laterality: N/A;   TRANSURETHRAL RESECTION OF BLADDER TUMOR N/A 05/31/2015   Procedure: Cystoscopy with clott evacuation and fulgeration of bleeders, retrograde pylegram;  Surgeon: Sebastian Ache, MD;  Location: [facility];  Service: Urology;  Laterality: N/A;    Home Medications:  Allergies as of 03/14/2023       Reactions   Lipitor [atorvastatin] Other (See Comments)   Myalgia   Other Other (See Comments)   All Cholesterol Meds - causes muscle and joint pain   Pravastatin Other (See Comments)   Myalgia        Medication List        Accurate as of March 13, 2023  8:25 PM. If you have any questions, ask your nurse or doctor.          acetaminophen 500 MG tablet Commonly known as: TYLENOL Take 500 mg by mouth every 6 (six) hours as needed (pain).   aspirin 81 MG chewable tablet Chew 1 tablet (81 mg total) by mouth daily.   carvedilol 12.5 MG tablet Commonly known as: COREG Take 1 tablet (12.5 mg total) by mouth in the morning and at bedtime.   empagliflozin 10 MG Tabs tablet Commonly known as: Jardiance Take 1 tablet (10 mg total) by mouth daily before breakfast.   glimepiride 2 MG tablet Commonly known as: AMARYL Take 1 tablet (2 mg total) by mouth 2 (two) times daily.   losartan 25 MG tablet Commonly known as: COZAAR Take 1 tablet (25 mg total) by mouth daily.   Repatha SureClick 140 MG/ML Soaj Generic drug: Evolocumab Inject 140 mg into the skin every 14 (fourteen) days.   Tradjenta 5 MG Tabs tablet Generic drug: linagliptin Take 1 tablet (5 mg total) by mouth daily.         Allergies:  Allergies  Allergen Reactions   Lipitor [Atorvastatin] Other (See Comments)    Myalgia   Other Other (See Comments)    All Cholesterol Meds - causes muscle and joint pain   Pravastatin Other (See Comments)    Myalgia    Family History  Problem Relation Age of Onset   Cancer Child    Asthma Mother     Social History:  reports that he quit smoking about 30 years ago. His smoking use included cigarettes. He has a 30.00 pack-year smoking history. He has never used smokeless tobacco. He reports that he does not drink alcohol and does not use drugs.  ROS: A complete review of systems was performed.  All systems are negative except for pertinent findings as noted.  Physical Exam:  Vital signs in last 24 hours: There were no  vitals taken for this visit. Constitutional:  Alert and oriented, No acute distress Cardiovascular: Regular rate  Respiratory: Normal respiratory effort Neurologic: Grossly intact, no focal deficits Psychiatric: Normal mood and affect  I have reviewed prior pt notes  I have reviewed urinalysis results--clear  I have independently reviewed prior imaging--prostate ultrasound volume reviewed  I have reviewed prior PSA and pathology results  I have reviewed prior urine culture  Cystoscopy Procedure Note:  Indication: Bladder cancer surveillance  After informed consent and discussion of the procedure and its risks, Timothy [name] was positioned and prepped in the standard fashion.  Cystoscopy was performed with a flexible cystoscope.   Findings: Urethra: No stricture or lesion Prostate: Obstructing, mildly with bilobar hypertrophy Bladder neck: Open Ureteral orifices: Normal bilaterally Bladder: Several erythematous areas, stable, consistent with prior biopsy sites.  No urothelial abnormalities.  The patient tolerated the procedure well.     Impression/Assessment:   1.  History of high-grade nonmuscle invasive bladder cancer,  completed immune therapy.  Overall cystoscopy today looks stable  2.  Prostate cancer, low-volume, low risk, on active surveillance  Plan:  1.  Cystoscopy covered with Cipro  2.  Bladder washing sent for cytology  3.  PSA will be checked today  4.  I will have him come back in 6 months for cystoscopy

## 2023-03-14 ENCOUNTER — Ambulatory Visit: Payer: Medicare HMO | Admitting: Urology

## 2023-03-14 ENCOUNTER — Encounter: Payer: Self-pay | Admitting: Urology

## 2023-03-14 VITALS — BP 137/81 | HR 62

## 2023-03-14 DIAGNOSIS — Z8551 Personal history of malignant neoplasm of bladder: Secondary | ICD-10-CM | POA: Diagnosis not present

## 2023-03-14 DIAGNOSIS — C61 Malignant neoplasm of prostate: Secondary | ICD-10-CM | POA: Diagnosis not present

## 2023-03-14 DIAGNOSIS — R8289 Other abnormal findings on cytological and histological examination of urine: Secondary | ICD-10-CM | POA: Diagnosis not present

## 2023-03-14 LAB — URINALYSIS, ROUTINE W REFLEX MICROSCOPIC
Bilirubin, UA: NEGATIVE
Ketones, UA: NEGATIVE
Leukocytes,UA: NEGATIVE
Nitrite, UA: NEGATIVE
Protein,UA: NEGATIVE
RBC, UA: NEGATIVE
Specific Gravity, UA: 1.025 (ref 1.005–1.030)
Urobilinogen, Ur: 1 mg/dL (ref 0.2–1.0)
pH, UA: 6 (ref 5.0–7.5)

## 2023-03-14 MED ORDER — CIPROFLOXACIN HCL 500 MG PO TABS
500.0000 mg | ORAL_TABLET | Freq: Once | ORAL | Status: AC
Start: 2023-03-14 — End: 2023-03-14
  Administered 2023-03-14: 500 mg via ORAL

## 2023-03-14 NOTE — Addendum Note (Signed)
Addended by: Ferdinand Lango on: 03/14/2023 11:41 AM   Modules accepted: Orders

## 2023-03-16 LAB — CYTOLOGY, URINE

## 2023-03-17 ENCOUNTER — Telehealth: Payer: Self-pay

## 2023-03-17 NOTE — Telephone Encounter (Signed)
Patient/wife is aware of MD recommendation and voiced understanding

## 2023-03-17 NOTE — Telephone Encounter (Signed)
-----   Message from Marcine Matar, MD sent at 03/17/2023 12:59 PM EDT ----- Notify pt no cancer cells seen in urine ----- Message ----- From: Troy Sine, CMA Sent: 03/17/2023   8:24 AM EDT To: Marcine Matar, MD  Please review

## 2023-05-05 ENCOUNTER — Other Ambulatory Visit (HOSPITAL_COMMUNITY): Payer: Self-pay | Admitting: Internal Medicine

## 2023-05-05 DIAGNOSIS — E1159 Type 2 diabetes mellitus with other circulatory complications: Secondary | ICD-10-CM | POA: Diagnosis not present

## 2023-05-05 DIAGNOSIS — E1140 Type 2 diabetes mellitus with diabetic neuropathy, unspecified: Secondary | ICD-10-CM | POA: Diagnosis not present

## 2023-05-05 DIAGNOSIS — I10 Essential (primary) hypertension: Secondary | ICD-10-CM | POA: Diagnosis not present

## 2023-05-05 DIAGNOSIS — E1151 Type 2 diabetes mellitus with diabetic peripheral angiopathy without gangrene: Secondary | ICD-10-CM | POA: Diagnosis not present

## 2023-05-05 DIAGNOSIS — I739 Peripheral vascular disease, unspecified: Secondary | ICD-10-CM

## 2023-05-05 DIAGNOSIS — Z6833 Body mass index (BMI) 33.0-33.9, adult: Secondary | ICD-10-CM | POA: Diagnosis not present

## 2023-05-05 DIAGNOSIS — R0789 Other chest pain: Secondary | ICD-10-CM | POA: Diagnosis not present

## 2023-05-05 DIAGNOSIS — E1165 Type 2 diabetes mellitus with hyperglycemia: Secondary | ICD-10-CM | POA: Diagnosis not present

## 2023-05-05 DIAGNOSIS — E1129 Type 2 diabetes mellitus with other diabetic kidney complication: Secondary | ICD-10-CM | POA: Diagnosis not present

## 2023-05-05 DIAGNOSIS — E6609 Other obesity due to excess calories: Secondary | ICD-10-CM | POA: Diagnosis not present

## 2023-05-11 DIAGNOSIS — E1140 Type 2 diabetes mellitus with diabetic neuropathy, unspecified: Secondary | ICD-10-CM | POA: Diagnosis not present

## 2023-05-11 DIAGNOSIS — M79671 Pain in right foot: Secondary | ICD-10-CM | POA: Diagnosis not present

## 2023-05-11 DIAGNOSIS — M79672 Pain in left foot: Secondary | ICD-10-CM | POA: Diagnosis not present

## 2023-05-11 DIAGNOSIS — B351 Tinea unguium: Secondary | ICD-10-CM | POA: Diagnosis not present

## 2023-05-11 DIAGNOSIS — M1990 Unspecified osteoarthritis, unspecified site: Secondary | ICD-10-CM | POA: Diagnosis not present

## 2023-05-15 ENCOUNTER — Ambulatory Visit (HOSPITAL_COMMUNITY)
Admission: RE | Admit: 2023-05-15 | Discharge: 2023-05-15 | Disposition: A | Discharge status: Home / Self Care | Payer: Medicare HMO | Source: Ambulatory Visit | Attending: Internal Medicine | Admitting: Internal Medicine

## 2023-05-15 DIAGNOSIS — I739 Peripheral vascular disease, unspecified: Secondary | ICD-10-CM

## 2023-05-15 DIAGNOSIS — E1151 Type 2 diabetes mellitus with diabetic peripheral angiopathy without gangrene: Secondary | ICD-10-CM | POA: Diagnosis not present

## 2023-05-22 ENCOUNTER — Telehealth: Payer: Self-pay | Admitting: Cardiology

## 2023-05-22 NOTE — Telephone Encounter (Signed)
Spoke with pt's wife Myrene Buddy ( ok per DPR). She states that the test was ordered  by the PCP office. Instructed wife to have pt call PCP office.

## 2023-05-22 NOTE — Telephone Encounter (Signed)
Patient states he was told he was told he has blood clots and was told someone was supposed to call him back. He says he has not gotten a call and does not have the number of the person who called him.

## 2023-05-29 ENCOUNTER — Ambulatory Visit: Payer: Medicare HMO | Admitting: Physician Assistant

## 2023-05-29 VITALS — BP 155/75 | HR 57 | Temp 97.5°F | Resp 16 | Ht 68.0 in | Wt 210.8 lb

## 2023-05-29 DIAGNOSIS — I739 Peripheral vascular disease, unspecified: Secondary | ICD-10-CM

## 2023-05-29 NOTE — Progress Notes (Signed)
Office Note   History of Present Illness   Timothy [name] is a 77 y.o. ([birth year]) male who presents as a triage visit.  He has a history of atherectomy and drug-coated balloon angioplasty of the left SFA in 2019 for claudication.  He developed recurrent stenosis and underwent left SFA stenting in August 2020 by Dr. Myra Gianotti.  He was last seen by our office in December 2023 and was asymptomatic at the time.  He returns today as a triage visit.  He states over the past few months he has developed cramping in both of his calves again while walking. He feels like he can only take about 20-30 steps before he has to stop. The cramping is equal in both of his calves. It does not happen at rest or when laying down. He thought the cramping was from his blood pressure medicine, but it has persisted even after decreasing the dosage of his medication. He denies any rest pain or tissue loss.  He got recent ABIs ordered by his PCP and these were performed on 6/10.  Current Outpatient Medications  Medication Sig Dispense Refill   acetaminophen (TYLENOL) 500 MG tablet Take 500 mg by mouth every 6 (six) hours as needed (pain).     aspirin 81 MG chewable tablet Chew 1 tablet (81 mg total) by mouth daily.     carvedilol (COREG) 12.5 MG tablet Take 1 tablet (12.5 mg total) by mouth in the morning and at bedtime. 180 tablet 3   empagliflozin (JARDIANCE) 10 MG TABS tablet Take 1 tablet (10 mg total) by mouth daily before breakfast. 90 tablet 1   Evolocumab (REPATHA SURECLICK) 140 MG/ML SOAJ Inject 140 mg into the skin every 14 (fourteen) days. 2 mL 11   glimepiride (AMARYL) 2 MG tablet Take 1 tablet (2 mg total) by mouth 2 (two) times daily. 180 tablet 3   losartan (COZAAR) 25 MG tablet Take 1 tablet (25 mg total) by mouth daily. 90 tablet 3   TRADJENTA 5 MG TABS tablet Take 1 tablet (5 mg total) by mouth daily. (Patient not taking: Reported on 05/29/2023) 90 tablet 1   No current facility-administered  medications for this visit.    REVIEW OF SYSTEMS (negative unless checked):   Cardiac:  []  Chest pain or chest pressure? []  Shortness of breath upon activity? []  Shortness of breath when lying flat? []  Irregular heart rhythm?  Vascular:  [x]  Pain in calf, thigh, or hip brought on by walking? []  Pain in feet at night that wakes you up from your sleep? []  Blood clot in your veins? []  Leg swelling?  Pulmonary:  []  Oxygen at home? []  Productive cough? []  Wheezing?  Neurologic:  []  Sudden weakness in arms or legs? []  Sudden numbness in arms or legs? []  Sudden onset of difficult speaking or slurred speech? []  Temporary loss of vision in one eye? []  Problems with dizziness?  Gastrointestinal:  []  Blood in stool? []  Vomited blood?  Genitourinary:  []  Burning when urinating? []  Blood in urine?  Psychiatric:  []  Major depression  Hematologic:  []  Bleeding problems? []  Problems with blood clotting?  Dermatologic:  []  Rashes or ulcers?  Constitutional:  []  Fever or chills?  Ear/Nose/Throat:  []  Change in hearing? []  Nose bleeds? []  Sore throat?  Musculoskeletal:  []  Back pain? []  Joint pain? []  Muscle pain?   Physical Examination   Vitals:   05/29/23 1031  BP: (!) 155/75  Pulse: (!) 57  Resp: 16  Temp: Marland Kitchen)  97.5 F (36.4 C)  TempSrc: Temporal  SpO2: 96%  Weight: 210 lb 12.8 oz (95.6 kg)  Height: 5\' 8"  (1.727 m)   Body mass index is 32.05 kg/m.  General:  WDWN in NAD; vital signs documented above Gait: Not observed HENT: WNL, normocephalic Pulmonary: normal non-labored breathing , without rales, rhonchi,  wheezing Cardiac: regular Abdomen: soft, NT, no masses Skin: without rashes Vascular Exam/Pulses: palpable femoral pulses, nonpalpable popliteal pulses or pedal pulses. Monophasic DP/PT doppler signals bilaterally Extremities: without ischemic changes, without gangrene , without cellulitis; without open wounds;  Musculoskeletal: no muscle  wasting or atrophy  Neurologic: A&O X 3;  No focal weakness or paresthesias are detected Psychiatric:  The pt has Normal affect.  Non-Invasive Vascular imaging   ABI (05/15/2023) R:  ABI: 0.72 (0.85),  PT: mono DP: mono TBI:  not performed L:  ABI: 0.52 (0.90),  PT: mono DP: tri TBI: not performed   Medical Decision Making   Timothy [name] is a 77 y.o. male who presents as a triage visit  The patient has a history of left SFA stenting due to claudication.  His studies performed in December demonstrated a patent left SFA stent and stable ABIs.  His right ABI was 0.85.  His left ABI was 0.90.  He had recent ABIs done on 6/10, with a left ABI of 0.52 and right ABI of 0.72 The patient has had worsening bilateral calf claudication over the past few months.  This is equal in both calves and occurs after 20-30 steps. He was previously asymptomatic in December. He previously had palpable pedal pulses in December. His pedal and popliteal pulses are not palpable today. He does have palpable femoral pulses. Given that his calf claudication has returned and his left ABI has significantly dropped from 0.90 to 0.52, it is likely that his left SFA stent has either occluded or has significant stenosis. He also potentially has worsening tibial disease. He would benefit from intervention. We will schedule the patient for abdominal aortogram with LLE runoff, possible left femoropopliteal atherectomy/stenting and possible tibial angioplasty in the next few weeks with Dr.Brabham   Timothy Dubonnet PA-C [facility] Office: [phone]  Clinic MD: Myra Gianotti

## 2023-05-29 NOTE — H&P (View-Only) (Signed)
  Office Note   History of Present Illness   Nuchem Crupi is a 77 y.o. (04/09/1946) male who presents as a triage visit.  He has a history of atherectomy and drug-coated balloon angioplasty of the left SFA in 2019 for claudication.  He developed recurrent stenosis and underwent left SFA stenting in August 2020 by Dr. Brabham.  He was last seen by our office in December 2023 and was asymptomatic at the time.  He returns today as a triage visit.  He states over the past few months he has developed cramping in both of his calves again while walking. He feels like he can only take about 20-30 steps before he has to stop. The cramping is equal in both of his calves. It does not happen at rest or when laying down. He thought the cramping was from his blood pressure medicine, but it has persisted even after decreasing the dosage of his medication. He denies any rest pain or tissue loss.  He got recent ABIs ordered by his PCP and these were performed on 6/10.  Current Outpatient Medications  Medication Sig Dispense Refill   acetaminophen (TYLENOL) 500 MG tablet Take 500 mg by mouth every 6 (six) hours as needed (pain).     aspirin 81 MG chewable tablet Chew 1 tablet (81 mg total) by mouth daily.     carvedilol (COREG) 12.5 MG tablet Take 1 tablet (12.5 mg total) by mouth in the morning and at bedtime. 180 tablet 3   empagliflozin (JARDIANCE) 10 MG TABS tablet Take 1 tablet (10 mg total) by mouth daily before breakfast. 90 tablet 1   Evolocumab (REPATHA SURECLICK) 140 MG/ML SOAJ Inject 140 mg into the skin every 14 (fourteen) days. 2 mL 11   glimepiride (AMARYL) 2 MG tablet Take 1 tablet (2 mg total) by mouth 2 (two) times daily. 180 tablet 3   losartan (COZAAR) 25 MG tablet Take 1 tablet (25 mg total) by mouth daily. 90 tablet 3   TRADJENTA 5 MG TABS tablet Take 1 tablet (5 mg total) by mouth daily. (Patient not taking: Reported on 05/29/2023) 90 tablet 1   No current facility-administered  medications for this visit.    REVIEW OF SYSTEMS (negative unless checked):   Cardiac:  [] Chest pain or chest pressure? [] Shortness of breath upon activity? [] Shortness of breath when lying flat? [] Irregular heart rhythm?  Vascular:  [x] Pain in calf, thigh, or hip brought on by walking? [] Pain in feet at night that wakes you up from your sleep? [] Blood clot in your veins? [] Leg swelling?  Pulmonary:  [] Oxygen at home? [] Productive cough? [] Wheezing?  Neurologic:  [] Sudden weakness in arms or legs? [] Sudden numbness in arms or legs? [] Sudden onset of difficult speaking or slurred speech? [] Temporary loss of vision in one eye? [] Problems with dizziness?  Gastrointestinal:  [] Blood in stool? [] Vomited blood?  Genitourinary:  [] Burning when urinating? [] Blood in urine?  Psychiatric:  [] Major depression  Hematologic:  [] Bleeding problems? [] Problems with blood clotting?  Dermatologic:  [] Rashes or ulcers?  Constitutional:  [] Fever or chills?  Ear/Nose/Throat:  [] Change in hearing? [] Nose bleeds? [] Sore throat?  Musculoskeletal:  [] Back pain? [] Joint pain? [] Muscle pain?   Physical Examination   Vitals:   05/29/23 1031  BP: (!) 155/75  Pulse: (!) 57  Resp: 16  Temp: (!)   97.5 F (36.4 C)  TempSrc: Temporal  SpO2: 96%  Weight: 210 lb 12.8 oz (95.6 kg)  Height: 5' 8" (1.727 m)   Body mass index is 32.05 kg/m.  General:  WDWN in NAD; vital signs documented above Gait: Not observed HENT: WNL, normocephalic Pulmonary: normal non-labored breathing , without rales, rhonchi,  wheezing Cardiac: regular Abdomen: soft, NT, no masses Skin: without rashes Vascular Exam/Pulses: palpable femoral pulses, nonpalpable popliteal pulses or pedal pulses. Monophasic DP/PT doppler signals bilaterally Extremities: without ischemic changes, without gangrene , without cellulitis; without open wounds;  Musculoskeletal: no muscle  wasting or atrophy  Neurologic: A&O X 3;  No focal weakness or paresthesias are detected Psychiatric:  The pt has Normal affect.  Non-Invasive Vascular imaging   ABI (05/15/2023) R:  ABI: 0.72 (0.85),  PT: mono DP: mono TBI:  not performed L:  ABI: 0.52 (0.90),  PT: mono DP: tri TBI: not performed   Medical Decision Making   Jakaree Capobianco is a 77 y.o. male who presents as a triage visit  The patient has a history of left SFA stenting due to claudication.  His studies performed in December demonstrated a patent left SFA stent and stable ABIs.  His right ABI was 0.85.  His left ABI was 0.90.  He had recent ABIs done on 6/10, with a left ABI of 0.52 and right ABI of 0.72 The patient has had worsening bilateral calf claudication over the past few months.  This is equal in both calves and occurs after 20-30 steps. He was previously asymptomatic in December. He previously had palpable pedal pulses in December. His pedal and popliteal pulses are not palpable today. He does have palpable femoral pulses. Given that his calf claudication has returned and his left ABI has significantly dropped from 0.90 to 0.52, it is likely that his left SFA stent has either occluded or has significant stenosis. He also potentially has worsening tibial disease. He would benefit from intervention. We will schedule the patient for abdominal aortogram with LLE runoff, possible left femoropopliteal atherectomy/stenting and possible tibial angioplasty in the next few weeks with Dr.Brabham   Wilma Michaelson PA-C [facility] Office: [phone]  Clinic MD: Brabham 

## 2023-06-01 ENCOUNTER — Other Ambulatory Visit: Payer: Self-pay

## 2023-06-01 DIAGNOSIS — I739 Peripheral vascular disease, unspecified: Secondary | ICD-10-CM

## 2023-06-01 DIAGNOSIS — T82858A Stenosis of vascular prosthetic devices, implants and grafts, initial encounter: Secondary | ICD-10-CM

## 2023-06-12 ENCOUNTER — Telehealth: Payer: Self-pay

## 2023-06-12 NOTE — Telephone Encounter (Signed)
Per [employer], Berkley Harvey is only approved for CPT [id number] for an SFA Angioplasty and stent.  Codes 19147 and [phone] for an SFA Atherectomy and a Tibial Angioplasty are set to deny.  Per Dr. Myra Gianotti, "The patient probably will not need a tibeal angioplasty.Hopefully the patient will not need an atherectomy but he won't know until he starts the angiogram.  In some cases the angiogram is only diagnostic".  Dr. Myra Gianotti has agreed to do a Peer to Peer Review with Dr. Dina Rich today between 4:15 - 4:30 pm.

## 2023-06-13 ENCOUNTER — Encounter (HOSPITAL_COMMUNITY)
Admission: RE | Disposition: A | Discharge status: Home / Self Care | Payer: Self-pay | Source: Home / Self Care | Attending: Surgery

## 2023-06-13 ENCOUNTER — Ambulatory Visit (HOSPITAL_COMMUNITY)
Admission: RE | Admit: 2023-06-13 | Discharge: 2023-06-13 | Disposition: A | Discharge status: Home / Self Care | Payer: Medicare HMO | Attending: Surgery | Admitting: Surgery

## 2023-06-13 DIAGNOSIS — I70213 Atherosclerosis of native arteries of extremities with intermittent claudication, bilateral legs: Secondary | ICD-10-CM | POA: Diagnosis not present

## 2023-06-13 DIAGNOSIS — I70212 Atherosclerosis of native arteries of extremities with intermittent claudication, left leg: Secondary | ICD-10-CM | POA: Insufficient documentation

## 2023-06-13 DIAGNOSIS — T82858A Stenosis of vascular prosthetic devices, implants and grafts, initial encounter: Secondary | ICD-10-CM | POA: Diagnosis not present

## 2023-06-13 DIAGNOSIS — I739 Peripheral vascular disease, unspecified: Secondary | ICD-10-CM

## 2023-06-13 HISTORY — PX: PERIPHERAL VASCULAR INTERVENTION: CATH118257

## 2023-06-13 HISTORY — PX: ABDOMINAL AORTOGRAM W/LOWER EXTREMITY: CATH118223

## 2023-06-13 LAB — POCT I-STAT, CHEM 8
BUN: 26 mg/dL — ABNORMAL HIGH (ref 8–23)
Calcium, Ion: 1.17 mmol/L (ref 1.15–1.40)
Chloride: 107 mmol/L (ref 98–111)
Creatinine, Ser: 1.4 mg/dL — ABNORMAL HIGH (ref 0.61–1.24)
Glucose, Bld: 201 mg/dL — ABNORMAL HIGH (ref 70–99)
HCT: 47 % (ref 39.0–52.0)
Hemoglobin: 16 g/dL (ref 13.0–17.0)
Potassium: 4.4 mmol/L (ref 3.5–5.1)
Sodium: 140 mmol/L (ref 135–145)
TCO2: 27 mmol/L (ref 22–32)

## 2023-06-13 LAB — GLUCOSE, CAPILLARY: Glucose-Capillary: 140 mg/dL — ABNORMAL HIGH (ref 70–99)

## 2023-06-13 LAB — POCT ACTIVATED CLOTTING TIME: Activated Clotting Time: 287 seconds

## 2023-06-13 SURGERY — ABDOMINAL AORTOGRAM W/LOWER EXTREMITY
Anesthesia: LOCAL

## 2023-06-13 MED ORDER — LABETALOL HCL 5 MG/ML IV SOLN: 10.0000 mg | INTRAVENOUS | Status: DC | PRN

## 2023-06-13 MED ORDER — CLOPIDOGREL BISULFATE 75 MG PO TABS: 75.0000 mg | ORAL_TABLET | Freq: Every day | ORAL | Status: DC

## 2023-06-13 MED ORDER — SODIUM CHLORIDE 0.9 % IV SOLN: 250.0000 mL | INTRAVENOUS | Status: DC | PRN

## 2023-06-13 MED ORDER — ASPIRIN 81 MG PO CHEW
CHEWABLE_TABLET | ORAL | Status: AC
  Filled 2023-06-13: qty 1

## 2023-06-13 MED ORDER — HEPARIN SODIUM (PORCINE) 1000 UNIT/ML IJ SOLN
INTRAMUSCULAR | Status: DC | PRN
  Administered 2023-06-13: 10000 [IU] via INTRAVENOUS

## 2023-06-13 MED ORDER — SODIUM CHLORIDE 0.9% FLUSH: 3.0000 mL | INTRAVENOUS | Status: DC | PRN

## 2023-06-13 MED ORDER — ASPIRIN 81 MG PO CHEW
CHEWABLE_TABLET | ORAL | Status: DC | PRN
  Administered 2023-06-13: 81 mg via ORAL

## 2023-06-13 MED ORDER — CLOPIDOGREL BISULFATE 300 MG PO TABS
ORAL_TABLET | ORAL | Status: AC
  Filled 2023-06-13: qty 1

## 2023-06-13 MED ORDER — LIDOCAINE HCL (PF) 1 % IJ SOLN
INTRAMUSCULAR | Status: AC
  Filled 2023-06-13: qty 30

## 2023-06-13 MED ORDER — HEPARIN (PORCINE) IN NACL 1000-0.9 UT/500ML-% IV SOLN
INTRAVENOUS | Status: DC | PRN
  Administered 2023-06-13 (×2): 500 mL

## 2023-06-13 MED ORDER — FENTANYL CITRATE (PF) 100 MCG/2ML IJ SOLN
INTRAMUSCULAR | Status: DC | PRN
  Administered 2023-06-13: 50 ug via INTRAVENOUS

## 2023-06-13 MED ORDER — ONDANSETRON HCL 4 MG/2ML IJ SOLN: 4.0000 mg | Freq: Four times a day (QID) | INTRAMUSCULAR | Status: DC | PRN

## 2023-06-13 MED ORDER — SODIUM CHLORIDE 0.9 % IV SOLN: INTRAVENOUS | Status: DC

## 2023-06-13 MED ORDER — MORPHINE SULFATE (PF) 2 MG/ML IV SOLN: 2.0000 mg | INTRAVENOUS | Status: DC | PRN

## 2023-06-13 MED ORDER — FENTANYL CITRATE (PF) 100 MCG/2ML IJ SOLN
INTRAMUSCULAR | Status: AC
  Filled 2023-06-13: qty 2

## 2023-06-13 MED ORDER — MIDAZOLAM HCL 2 MG/2ML IJ SOLN
INTRAMUSCULAR | Status: AC
  Filled 2023-06-13: qty 2

## 2023-06-13 MED ORDER — IODIXANOL 320 MG/ML IV SOLN
INTRAVENOUS | Status: DC | PRN
  Administered 2023-06-13: 150 mL

## 2023-06-13 MED ORDER — LIDOCAINE HCL (PF) 1 % IJ SOLN
INTRAMUSCULAR | Status: DC | PRN
  Administered 2023-06-13: 15 mL

## 2023-06-13 MED ORDER — CLOPIDOGREL BISULFATE 75 MG PO TABS: 75.0000 mg | ORAL_TABLET | Freq: Every day | ORAL | 11 refills | Status: DC

## 2023-06-13 MED ORDER — ASPIRIN 81 MG PO TBEC: 81.0000 mg | DELAYED_RELEASE_TABLET | Freq: Every day | ORAL | Status: DC

## 2023-06-13 MED ORDER — OXYCODONE HCL 5 MG PO TABS: 5.0000 mg | ORAL_TABLET | ORAL | Status: DC | PRN

## 2023-06-13 MED ORDER — HEPARIN SODIUM (PORCINE) 1000 UNIT/ML IJ SOLN
INTRAMUSCULAR | Status: AC
  Filled 2023-06-13: qty 10

## 2023-06-13 MED ORDER — ACETAMINOPHEN 325 MG PO TABS: 650.0000 mg | ORAL_TABLET | ORAL | Status: DC | PRN

## 2023-06-13 MED ORDER — MIDAZOLAM HCL 2 MG/2ML IJ SOLN
INTRAMUSCULAR | Status: DC | PRN
  Administered 2023-06-13: 2 mg via INTRAVENOUS

## 2023-06-13 MED ORDER — SODIUM CHLORIDE 0.9% FLUSH: 3.0000 mL | Freq: Two times a day (BID) | INTRAVENOUS | Status: DC

## 2023-06-13 MED ORDER — SODIUM CHLORIDE 0.9 % WEIGHT BASED INFUSION: 1.0000 mL/kg/h | INTRAVENOUS | Status: DC

## 2023-06-13 MED ORDER — HYDRALAZINE HCL 20 MG/ML IJ SOLN: 5.0000 mg | INTRAMUSCULAR | Status: DC | PRN

## 2023-06-13 MED ORDER — CLOPIDOGREL BISULFATE 300 MG PO TABS
ORAL_TABLET | ORAL | Status: DC | PRN
  Administered 2023-06-13: 300 mg via ORAL

## 2023-06-13 MED ORDER — CLOPIDOGREL BISULFATE 75 MG PO TABS: 300.0000 mg | ORAL_TABLET | Freq: Once | ORAL | Status: DC

## 2023-06-13 SURGICAL SUPPLY — 23 items
BALLN MUSTANG 5X100X135 (BALLOONS) ×2
BALLOON MUSTANG 5X100X135 (BALLOONS) IMPLANT
CATH OMNI FLUSH 5F 65CM (CATHETERS) IMPLANT
CATH QUICKCROSS SUPP .035X90CM (MICROCATHETER) IMPLANT
DEVICE TORQUE SEADRAGON GRN (MISCELLANEOUS) IMPLANT
DEVICE VASC CLSR CELT ART 6 (Vascular Products) IMPLANT
GUIDEWIRE ANGLED .035X150CM (WIRE) IMPLANT
KIT ENCORE 26 ADVANTAGE (KITS) IMPLANT
KIT MICROPUNCTURE NIT STIFF (SHEATH) IMPLANT
KIT PV (KITS) ×3 IMPLANT
SHEATH CATAPULT 6FR 45 (SHEATH) IMPLANT
SHEATH PINNACLE 5F 10CM (SHEATH) IMPLANT
SHEATH PINNACLE 6F 10CM (SHEATH) IMPLANT
SHEATH PROBE COVER 6X72 (BAG) IMPLANT
STENT ELUVIA 6X150X130 (Permanent Stent) IMPLANT
STENT ELUVIA 6X40X130 (Permanent Stent) IMPLANT
STOPCOCK MORSE 400PSI 3WAY (MISCELLANEOUS) IMPLANT
SYR MEDRAD MARK 7 150ML (SYRINGE) ×3 IMPLANT
TRANSDUCER W/STOPCOCK (MISCELLANEOUS) ×3 IMPLANT
TRAY PV CATH (CUSTOM PROCEDURE TRAY) ×3 IMPLANT
TUBING CIL FLEX 10 FLL-RA (TUBING) IMPLANT
WIRE BENTSON .035X145CM (WIRE) IMPLANT
WIRE HI TORQ VERSACORE 300 (WIRE) IMPLANT

## 2023-06-13 NOTE — Progress Notes (Signed)
Pt ambulated without difficulty or bleeding.  Discharge instructions given to pt and wife who verbalize understanding and deny further questions.  Discharged home with  wife who will drive and stay with pt x 24 hrs.

## 2023-06-13 NOTE — Interval H&P Note (Signed)
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  The various methods of treatment have been discussed with the patient and family. After consideration of risks, benefits and other options for treatment, the patient has consented to  Procedure(s): ABDOMINAL AORTOGRAM W/LOWER EXTREMITY (N/A) as a surgical intervention.  The patient's history has been reviewed, patient examined, no change in status, stable for surgery.  I have reviewed the patient's chart and labs.  Questions were answered to the patient's satisfaction.     Durene Cal

## 2023-06-13 NOTE — Op Note (Signed)
Patient name: Timothy [name] MRN: [id number] DOB: [birth year] Sex: male  06/13/2023 Pre-operative Diagnosis: Claudication Post-operative diagnosis:  Same Surgeon:  Durene Cal Procedure Performed:  1.  Ultrasound-guided access, right femoral artery  2.  Abdominal aortogram  3.  Bilateral lower extremity angiogram  4.  Selective injection with cath in the left superficial femoral artery from the right common femoral artery  5.  Stent, left superficial femoral artery  6.  Conscious sedation, 71 minutes  7.  Closure device, Celt   Indications: This is a 77 year old gentleman who has previously undergone lower extremity intervention to the left leg.  He has had recurrent symptoms with a drop in his ABIs and now has bilateral symptoms.  He is here for further evaluation and possible intervention.  Procedure:  The patient was identified in the holding area and taken to room 8.  The patient was then placed supine on the table and prepped and draped in the usual sterile fashion.  A time out was called.  Conscious sedation was administered with the use of IV fentanyl and Versed under continuous physician and nurse monitoring.  Heart rate, blood pressure, and oxygen saturation were continuously monitored.  Total sedation time was 71 minutes.  Ultrasound was used to evaluate the right common femoral artery.  It was patent .  A digital ultrasound image was acquired.  A micropuncture needle was used to access the right common femoral artery under ultrasound guidance.  An 018 wire was advanced without resistance and a micropuncture sheath was placed.  The 018 wire was removed and a benson wire was placed.  The micropuncture sheath was exchanged for a 5 french sheath.  An omniflush catheter was advanced over the wire to the level of L-1.  An abdominal angiogram was obtained.  Next, using the omniflush catheter and a benson wire, the aortic bifurcation was crossed and the catheter was placed into theleft  external iliac artery and left runoff was obtained.  right runoff was performed via retrograde sheath injections.  Findings:   Aortogram: No significant renal artery stenosis was identified.  The infrarenal abdominal aorta is widely patent as are bilateral common and external iliac arteries.  Again noted is a luminal irregularity between the common and external iliac arteries which does not have a pressure gradient.  Bilateral common femoral arteries are widely patent  Right Lower Extremity: Right common femoral profundofemoral artery are widely patent.  Superficial femoral artery has mild disease throughout.  The popliteal artery is patent.  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  Superficial artery is occluded just proximal to the previously placed stent.  There is reconstitution of the above-knee popliteal artery to distal edge of the stent.  Posterior tibial runoff  Intervention: After the above images were acquired the decision was made to proceed with intervention.  A 6 French 45 cm sheath was inserted.  The patient was fully heparinized.  Using an 035 Glidewire and a quick cross catheter, I was able to cross the occluded proximal superficial femoral artery and reenter within the stent.  Selective injections with the cath in the popliteal artery were performed confirming successful reentry.  I also performed selective injections with the catheter in the proximal superficial femoral artery to better define his anatomy.  I then predilated the occlusion with a 5 mm balloon and treated  it with a 6 x 150 Eluvia stent that had about a 1 cm overlap from the previously placed stent.  I also elected to treat the distal edge of the previous stent with a 6 x 40 Eluvia.  All lesions were postdilated with a 5 mm balloon.  Completion imaging showed  resolution of the stenosis with no change in runoff.  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  This was able to be crossed and treated with a 6 x 150 Eluvia stent.  There was also a 60% stenosis at the distal edge of the previously placed stent.  This was covered with a 6 x 40 Eluvia.  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  This lesion is amenable to percutaneous intervention if the symptoms justify treatment is tibials   V. Durene Cal, M.D., [facility] [place] Office: [phone] Pager:  [phone]

## 2023-06-14 ENCOUNTER — Encounter (HOSPITAL_COMMUNITY): Payer: Self-pay | Admitting: Surgery

## 2023-06-22 ENCOUNTER — Ambulatory Visit: Payer: Medicare HMO | Admitting: Nurse Practitioner

## 2023-06-22 DIAGNOSIS — E1122 Type 2 diabetes mellitus with diabetic chronic kidney disease: Secondary | ICD-10-CM

## 2023-06-22 DIAGNOSIS — E782 Mixed hyperlipidemia: Secondary | ICD-10-CM

## 2023-06-22 DIAGNOSIS — I10 Essential (primary) hypertension: Secondary | ICD-10-CM

## 2023-06-22 DIAGNOSIS — Z7984 Long term (current) use of oral hypoglycemic drugs: Secondary | ICD-10-CM

## 2023-06-30 ENCOUNTER — Other Ambulatory Visit: Payer: Self-pay | Admitting: Student

## 2023-07-03 ENCOUNTER — Other Ambulatory Visit: Payer: Self-pay | Admitting: *Deleted

## 2023-07-03 DIAGNOSIS — T82858A Stenosis of vascular prosthetic devices, implants and grafts, initial encounter: Secondary | ICD-10-CM

## 2023-07-03 DIAGNOSIS — I739 Peripheral vascular disease, unspecified: Secondary | ICD-10-CM

## 2023-07-04 ENCOUNTER — Encounter: Payer: Self-pay | Admitting: Nurse Practitioner

## 2023-07-04 ENCOUNTER — Ambulatory Visit: Payer: Medicare HMO | Admitting: Nurse Practitioner

## 2023-07-04 VITALS — BP 116/69 | HR 64 | Ht 68.0 in | Wt 211.8 lb

## 2023-07-04 DIAGNOSIS — N1831 Chronic kidney disease, stage 3a: Secondary | ICD-10-CM

## 2023-07-04 DIAGNOSIS — Z7984 Long term (current) use of oral hypoglycemic drugs: Secondary | ICD-10-CM

## 2023-07-04 DIAGNOSIS — E782 Mixed hyperlipidemia: Secondary | ICD-10-CM | POA: Diagnosis not present

## 2023-07-04 DIAGNOSIS — E1122 Type 2 diabetes mellitus with diabetic chronic kidney disease: Secondary | ICD-10-CM

## 2023-07-04 DIAGNOSIS — I10 Essential (primary) hypertension: Secondary | ICD-10-CM

## 2023-07-04 LAB — POCT GLYCOSYLATED HEMOGLOBIN (HGB A1C): Hemoglobin A1C: 8 % — AB (ref 4.0–5.6)

## 2023-07-04 MED ORDER — GLIMEPIRIDE 2 MG PO TABS: 2.0000 mg | ORAL_TABLET | Freq: Two times a day (BID) | ORAL | 3 refills | Status: DC

## 2023-07-04 MED ORDER — TRADJENTA 5 MG PO TABS: 5.0000 mg | ORAL_TABLET | Freq: Every day | ORAL | 1 refills | Status: DC

## 2023-07-04 MED ORDER — EMPAGLIFLOZIN 10 MG PO TABS: 10.0000 mg | ORAL_TABLET | Freq: Every day | ORAL | 1 refills | Status: DC

## 2023-07-04 NOTE — Progress Notes (Signed)
Endocrinology Follow Up Note       07/04/2023, 9:23 AM   Subjective:    Patient ID: Timothy [name], male    DOB: [birth year].  Timothy [name] is being seen in follow up after being seen in consultation for management of currently uncontrolled symptomatic diabetes requested by  Elfredia Nevins, MD.   Past Medical History:  Diagnosis Date   Arthritis    Bladder cancer (HCC)    Blood clot in vein 2018   left   CAD (coronary artery disease)    a. 06/2014: NSTEMI s/p PTCA of LCx, 60-80% residual in distal LAD, 70% prox small D1.   b. 02/06/15 NSTEMI  s/p successful PCI/DES to mid RCA c. angioplasty alone to LCx in 11/2016   Decreased GFR 45 % per 01-04-2021 labs   Diabetes mellitus, type 2 (HCC)    Elevated PSA    HLD (hyperlipidemia)    HOH (hard of hearing)    Hypertension    NSTEMI (non-ST elevated myocardial infarction) (HCC) 2015   Peripheral vascular disease (HCC)    Poor historian    Wears hearing aid in right ear     Past Surgical History:  Procedure Laterality Date   ABDOMINAL AORTOGRAM W/LOWER EXTREMITY N/A 08/28/2018   Procedure: ABDOMINAL AORTOGRAM W/LOWER EXTREMITY;  Surgeon: Nada Libman, MD;  [facility];  Service: Cardiovascular;  Laterality: N/A;  bilateral   ABDOMINAL AORTOGRAM W/LOWER EXTREMITY N/A 07/09/2019   Procedure: ABDOMINAL AORTOGRAM W/LOWER EXTREMITY;  Surgeon: Nada Libman, MD;  Location: [facility];  Service: Cardiovascular;  Laterality: N/A;  unilateral Lt   ABDOMINAL AORTOGRAM W/LOWER EXTREMITY N/A 06/13/2023   Procedure: ABDOMINAL AORTOGRAM W/LOWER EXTREMITY;  Surgeon: Nada Libman, MD;  Location: [facility];  Service: Cardiovascular;  Laterality: N/A;   CARDIAC CATHETERIZATION  02/06/2015   Procedure: CORONARY STENT INTERVENTION;  Surgeon: Marykay Lex, MD;  Location: [facility] CATH LAB;  Service: Cardiovascular;;  Mid RCA   CARDIAC  CATHETERIZATION N/A 11/11/2016   Procedure: Left Heart Cath and Coronary Angiography;  Surgeon: Corky Crafts, MD;  Location: [facility] INVASIVE CV LAB;  Service: Cardiovascular;  Laterality: N/A;   CARDIAC CATHETERIZATION N/A 11/11/2016   Procedure: Coronary Balloon Angioplasty;  Surgeon: Corky Crafts, MD;  Location: [facility] INVASIVE CV LAB;  Service: Cardiovascular;  Laterality: N/A;   COLONOSCOPY N/A 01/14/2019   Procedure: COLONOSCOPY;  Surgeon: Malissa Hippo, MD;  [facility]: [facility];  Service: Endoscopy;  Laterality: N/A;  730   CORONARY ANGIOPLASTY  11/11/2016   Mid Cx to Dist Cx lesion, 100 %stenosed. This lesion was treated with balloon angioplasty with a 2.0 balloon   CYSTOSCOPY W/ RETROGRADES N/A 05/05/2015   Procedure: CYSTOSCOPY WITH BILATERAL RETROGRADE PYELOGRAMS AND BLADDER BIOPSIES;  Surgeon: Marcine Matar, MD;  Location: [facility];  Service: Urology;  Laterality: N/A;   CYSTOSCOPY W/ RETROGRADES Bilateral 07/31/2018   Procedure: CYSTOSCOPY WITH BILATERAL RETROGRADE PYELOGRAM;  Surgeon: Marcine Matar, MD;  Location: [facility];  Service: Urology;  Laterality: Bilateral;   CYSTOSCOPY W/ RETROGRADES Right 02/15/2021   Procedure: CYSTOSCOPY WITH RETROGRADE PYELOGRAM;  Surgeon: Marcine Matar, MD;  Location: [facility];  Service: Urology;  Laterality: Right;   CYSTOSCOPY WITH BIOPSY N/A 07/31/2018   Procedure: CYSTOSCOPY WITH BIOPSY;  Surgeon: Marcine Matar, MD;  Location: [facility];  Service: Urology;  Laterality: N/A;   CYSTOSCOPY WITH BIOPSY N/A 02/15/2021   Procedure: CYSTOSCOPY WITH BIOPSY;  Surgeon: Marcine Matar, MD;  Location: [facility];  Service: Urology;  Laterality: N/A;   LEFT HEART CATHETERIZATION WITH CORONARY ANGIOGRAM N/A 06/17/2014   Procedure: LEFT HEART CATHETERIZATION WITH CORONARY ANGIOGRAM;  Surgeon: Marykay Lex, MD;  Location: [facility] CATH LAB;  Service: Cardiovascular;  Laterality: N/A;   LEFT HEART CATHETERIZATION  WITH CORONARY ANGIOGRAM N/A 02/06/2015   Procedure: LEFT HEART CATHETERIZATION WITH CORONARY ANGIOGRAM;  Surgeon: Marykay Lex, MD;  Location: [facility] CATH LAB;  Service: Cardiovascular;  Laterality: N/A;   PERIPHERAL VASCULAR ATHERECTOMY  08/28/2018   Procedure: PERIPHERAL VASCULAR ATHERECTOMY;  Surgeon: Nada Libman, MD;  [facility];  Service: Cardiovascular;;  Prox lt.SFA, Distal SFA   PERIPHERAL VASCULAR INTERVENTION  07/09/2019   Procedure: PERIPHERAL VASCULAR INTERVENTION;  Surgeon: Nada Libman, MD;  [facility];  Service: Cardiovascular;;  Lt. SFA   PERIPHERAL VASCULAR INTERVENTION  06/13/2023   Procedure: PERIPHERAL VASCULAR INTERVENTION;  Surgeon: Nada Libman, MD;  [facility];  Service: Cardiovascular;;  Lt SFA   POLYPECTOMY  01/14/2019   Procedure: POLYPECTOMY;  Surgeon: Malissa Hippo, MD;  [facility];  Service: Endoscopy;;  colon   PROSTATE BIOPSY N/A 03/31/2014   Procedure: BIOPSY TRANSRECTAL ULTRASONIC PROSTATE (TUBP);  Surgeon: Marcine Matar, MD;  Location: [facility];  Service: Urology;  Laterality: N/A;   PROSTATE BIOPSY N/A 02/15/2021   Procedure: BIOPSY TRANSRECTAL ULTRASONIC PROSTATE (TUBP);  Surgeon: Marcine Matar, MD;  Location: [facility];  Service: Urology;  Laterality: N/A;  45 MINS   TRANSURETHRAL RESECTION OF BLADDER TUMOR N/A 03/14/2013   Procedure: TRANSURETHRAL RESECTION OF BLADDER TUMOR (TURBT);  Surgeon: Marcine Matar, MD;  Location: [facility];  Service: Urology;  Laterality: N/A;  1 HR WITH MITOMYCIN INSTILLATION    TRANSURETHRAL RESECTION OF BLADDER TUMOR N/A 09/30/2013   Procedure: TRANSURETHRAL RESECTION OF BLADDER TUMOR (TURBT);  Surgeon: Marcine Matar, MD;  Location: [facility];  Service: Urology;  Laterality: N/A;   TRANSURETHRAL RESECTION OF BLADDER TUMOR N/A 03/31/2014   Procedure: TRANSURETHRAL RESECTION OF  BLADDER TUMOR (TURBT);  Surgeon: Marcine Matar, MD;  Location: [facility];  Service: Urology;  Laterality: N/A;   TRANSURETHRAL RESECTION OF BLADDER TUMOR N/A 05/31/2015   Procedure: Cystoscopy with clott evacuation and fulgeration of bleeders, retrograde pylegram;  Surgeon: Sebastian Ache, MD;  Location: [facility];  Service: Urology;  Laterality: N/A;    Social History   Socioeconomic History   Marital status: Married    Spouse name: Not on file   Number of children: Not on file   Years of education: Not on file   Highest education level: Not on file  Occupational History   Not on file  Tobacco Use   Smoking status: Former    Current packs/day: 0.00    Average packs/day: 1 pack/day for 30.0 years (30.0 ttl pk-yrs)    Types: Cigarettes    Start date: 03/12/1963    Quit date: 03/11/1993    Years since quitting: 30.3   Smokeless tobacco: Never  Vaping Use   Vaping status: Never Used  Substance and Sexual Activity   Alcohol use: No  Alcohol/week: 0.0 standard drinks of alcohol   Drug use: No   Sexual activity: Not Currently  Other Topics Concern   Not on file  Social History Narrative   Not on file   Social Determinants of Health   Financial Resource Strain: Low Risk  (11/14/2022)   Overall Financial Resource Strain (CARDIA)    Difficulty of Paying Living Expenses: Not very hard  Food Insecurity: No Food Insecurity (11/14/2022)   Hunger Vital Sign    Worried About Running Out of Food in the Last Year: Never true    Ran Out of Food in the Last Year: Never true  Transportation Needs: No Transportation Needs (11/14/2022)   PRAPARE - Administrator, Civil Service (Medical): No    Lack of Transportation (Non-Medical): No  Physical Activity: Not on file  Stress: Not on file  Social Connections: Not on file    Family History  Problem Relation Age of Onset   Cancer Child    Asthma Mother     Outpatient Encounter Medications as of 07/04/2023   Medication Sig   acetaminophen (TYLENOL) 500 MG tablet Take 500 mg by mouth every 6 (six) hours as needed (pain).   aspirin 81 MG chewable tablet Chew 1 tablet (81 mg total) by mouth daily.   carvedilol (COREG) 12.5 MG tablet Take 1 tablet (12.5 mg total) by mouth in the morning and at bedtime.   clopidogrel (PLAVIX) 75 MG tablet Take 1 tablet (75 mg total) by mouth daily.   diclofenac Sodium (VOLTAREN) 1 % GEL Apply 2 g topically daily as needed (pain).   Evolocumab (REPATHA SURECLICK) 140 MG/ML SOAJ Inject 140 mg into the skin every 14 (fourteen) days.   losartan (COZAAR) 25 MG tablet TAKE 1 TABLET (25 MG TOTAL) BY MOUTH DAILY.   naproxen sodium (ALEVE) 220 MG tablet Take 220 mg by mouth daily as needed (Pain).   [DISCONTINUED] empagliflozin (JARDIANCE) 10 MG TABS tablet Take 1 tablet (10 mg total) by mouth daily before breakfast.   [DISCONTINUED] glimepiride (AMARYL) 2 MG tablet Take 1 tablet (2 mg total) by mouth 2 (two) times daily.   empagliflozin (JARDIANCE) 10 MG TABS tablet Take 1 tablet (10 mg total) by mouth daily before breakfast.   glimepiride (AMARYL) 2 MG tablet Take 1 tablet (2 mg total) by mouth 2 (two) times daily.   TRADJENTA 5 MG TABS tablet Take 1 tablet (5 mg total) by mouth daily.   [DISCONTINUED] TRADJENTA 5 MG TABS tablet Take 1 tablet (5 mg total) by mouth daily. (Patient not taking: Reported on 05/29/2023)   No facility-administered encounter medications on file as of 07/04/2023.    ALLERGIES: Allergies  Allergen Reactions   Lipitor [Atorvastatin] Other (See Comments)    Myalgia   Other Other (See Comments)    All Cholesterol Meds - causes muscle and joint pain   Pravastatin Other (See Comments)    Myalgia    VACCINATION STATUS: Immunization History  Administered Date(s) Administered   Influenza, High Dose Seasonal PF 08/29/2017, 08/16/2018   Influenza-Unspecified 09/08/2014    Diabetes He presents for his follow-up diabetic visit. He has type 2  diabetes mellitus. Onset time: diagnosed at approx age of 83. His disease course has been stable. There are no hypoglycemic associated symptoms. There are no diabetic associated symptoms. There are no hypoglycemic complications. Diabetic complications include nephropathy. Risk factors for coronary artery disease include diabetes mellitus, dyslipidemia, family history, male sex, obesity, hypertension and sedentary lifestyle. Current diabetic treatment  includes oral agent (triple therapy). He is compliant with treatment some of the time (has not been taking Tradjenta due to misunderstanding). His weight is fluctuating minimally. He is following a generally unhealthy diet. When asked about meal planning, he reported none. He has not had a previous visit with a dietitian. He rarely participates in exercise. His home blood glucose trend is fluctuating minimally. His overall blood glucose range is 140-180 mg/dl. (He presents today with his meter (in spanish and wrong date) with readings averaging between 140-180.  His POCT A1c today is 8%, essentially unchanged from previous visit.  He notes he has not been taking his Tradjenta due to misunderstanding.  He asks if there is a cheaper alternative to [place].  I did print out patient assistance application for both those medications to help cover cost.  He denies any hypoglycemia.) An ACE inhibitor/angiotensin II receptor blocker is not being taken. He sees a podiatrist.Eye exam is current.     Review of systems  Constitutional: + Minimally fluctuating body weight,  current Body mass index is 32.2 kg/m. , no fatigue, no subjective hyperthermia, no subjective hypothermia Eyes: no blurry vision, no xerophthalmia ENT: no sore throat, no nodules palpated in throat, no dysphagia/odynophagia, no hoarseness Cardiovascular: no chest pain, no shortness of breath, no palpitations, no leg swelling Respiratory: no cough, no shortness of  breath Gastrointestinal: no nausea/vomiting/diarrhea Musculoskeletal: no muscle/joint aches Skin: no rashes, no hyperemia Neurological: no tremors, no numbness, no tingling, no dizziness Psychiatric: no depression, no anxiety  Objective:     BP 116/69 (BP Location: Left Arm, Patient Position: Sitting, Cuff Size: Large)   Pulse 64   Ht 5\' 8"  (1.727 m)   Wt 211 lb 12.8 oz (96.1 kg)   BMI 32.20 kg/m   Wt Readings from Last 3 Encounters:  07/04/23 211 lb 12.8 oz (96.1 kg)  06/13/23 210 lb (95.3 kg)  05/29/23 210 lb 12.8 oz (95.6 kg)     BP Readings from Last 3 Encounters:  07/04/23 116/69  06/13/23 (!) 161/69  05/29/23 (!) 155/75     Physical Exam- Limited  Constitutional:  Body mass index is 32.2 kg/m. , not in acute distress, normal state of mind, HOH wears bilateral hearing aids Eyes:  EOMI, no exophthalmos Musculoskeletal: no gross deformities, strength intact in all four extremities, no gross restriction of joint movements Skin:  no rashes, no hyperemia Neurological: no tremor with outstretched hands   Diabetic Foot Exam - Simple   No data filed     CMP ( most recent) CMP     Component Value Date/Time   NA 140 06/13/2023 0859   NA 143 10/21/2022 1001   K 4.4 06/13/2023 0859   CL 107 06/13/2023 0859   CO2 20 10/21/2022 1001   GLUCOSE 201 (H) 06/13/2023 0859   BUN 26 (H) 06/13/2023 0859   BUN 17 10/21/2022 1001   CREATININE 1.40 (H) 06/13/2023 0859   CREATININE 1.43 (H) 02/25/2020 1148   CALCIUM 9.6 10/21/2022 1001   PROT 7.2 11/10/2016 2351   ALBUMIN 4.2 11/10/2016 2351   AST 32 11/10/2016 2351   ALT 20 11/10/2016 2351   ALKPHOS 47 11/10/2016 2351   BILITOT 0.5 11/10/2016 2351   GFRNONAA 45 (L) 01/04/2021 0953   GFRAA 47 (L) 07/09/2019 0801     Diabetic Labs (most recent): Lab Results  Component Value Date   HGBA1C 8.0 (A) 07/04/2023   HGBA1C 8.1 (A) 03/09/2023   HGBA1C 7.9 08/23/2022  MICROALBUR 30 mg'l 10/10/2022     Lipid Panel ( most  recent) Lipid Panel     Component Value Date/Time   CHOL 138 11/12/2016 0101   TRIG 372 (A) 08/23/2022 0000   HDL 25 (L) 11/12/2016 0101   CHOLHDL 5.5 11/12/2016 0101   VLDL 39 11/12/2016 0101   LDLCALC 115 08/23/2022 0000      Lab Results  Component Value Date   TSH 1.17 08/23/2022   TSH 1.805 02/06/2015           Assessment & Plan:   1) Type 2 diabetes mellitus with stage 3a chronic kidney disease, without long-term current use of insulin (HCC)  He presents today with his meter (in spanish and wrong date) with readings averaging between 140-180.  His POCT A1c today is 8%, essentially unchanged from previous visit.  He notes he has not been taking his Tradjenta due to misunderstanding.  He asks if there is a cheaper alternative to [place].  I did print out patient assistance application for both those medications to help cover cost.  He denies any hypoglycemia.  - Azariel Paynter has currently uncontrolled symptomatic type 2 DM since 77 years of age.   -Recent labs reviewed.  - I had a long discussion with him about the progressive nature of diabetes and the pathology behind its complications. -his diabetes is complicated by mild CKD and he remains at a high risk for more acute and chronic complications which include CAD, CVA, CKD, retinopathy, and neuropathy. These are all discussed in detail with him.  The following Lifestyle Medicine recommendations according to American College of Lifestyle Medicine [facility]) were discussed and offered to patient and he agrees to start the journey:  A. Whole Foods, Plant-based plate comprising of fruits and vegetables, plant-based proteins, whole-grain carbohydrates was discussed in detail with the patient.   A list for source of those nutrients were also provided to the patient.  Patient will use only water or unsweetened tea for hydration. B.  The need to stay away from risky substances including alcohol, smoking; obtaining  7 to 9 hours of restorative sleep, at least 150 minutes of moderate intensity exercise weekly, the importance of healthy social connections,  and stress reduction techniques were discussed. C.  A full color page of  Calorie density of various food groups per pound showing examples of each food groups was provided to the patient.  - Nutritional counseling repeated at each appointment due to patients tendency to fall back in to old habits.  - The patient admits there is a room for improvement in their diet and drink choices. -  Suggestion is made for the patient to avoid simple carbohydrates from their diet including Cakes, Sweet Desserts / Pastries, Ice Cream, Soda (diet and regular), Sweet Tea, Candies, Chips, Cookies, Sweet Pastries, Store Bought Juices, Alcohol in Excess of 1-2 drinks a day, Artificial Sweeteners, Coffee Creamer, and "Sugar-free" Products. This will help patient to have stable blood glucose profile and potentially avoid unintended weight gain.   - I encouraged the patient to switch to unprocessed or minimally processed complex starch and increased protein intake (animal or plant source), fruits, and vegetables.   - Patient is advised to stick to a routine mealtimes to eat 3 meals a day and avoid unnecessary snacks (to snack only to correct hypoglycemia).  - I have approached him with the following individualized plan to manage his diabetes and patient agrees:   - he is advised to continue  Glimepiride 2 mg po twice daily with meals, therapeutically suitable for patient (not likely causing diarrhea), Tradjenta 5 mg po daily, and Jardiance 10 mg po daily.  I encouraged him to fill out the PAP forms and return ASAP to help get him financial assistance.  -he is encouraged to start monitoring glucose twice daily, before breakfast and before bed, and to call the clinic if he has readings less than 70 or above 300 for 3 tests in a row.  - Adjustment parameters are given to him for hypo  and hyperglycemia in writing. - he is encouraged to call clinic for blood glucose levels less than 70 or above 300 mg /dl.  - Specific targets for  A1c; LDL, HDL, and Triglycerides were discussed with the patient.  2) Blood Pressure /Hypertension:  his blood pressure is controlled to target.   he is advised to continue his current medications including Hydralazine 75 mg p.o. TID and Metoprolol 25 mg po daily.  3) Lipids/Hyperlipidemia:    Review of his recent lipid panel from 08/23/22 showed uncontrolled LDL at 115 and elevated triglycerides of 372.  He is statin intolerant says his PCP was going to start him on Repatha but has not heard about it yet.  4)  Weight/Diet:  his Body mass index is 32.2 kg/m.  -  clearly complicating his diabetes care.   he is a candidate for weight loss. I discussed with him the fact that loss of 5 - 10% of his  current body weight will have the most impact on his diabetes management.  Exercise, and detailed carbohydrates information provided  -  detailed on discharge instructions.  5) Chronic Care/Health Maintenance: -he is not on ACEI/ARB and is intolerant to Statin medications and is encouraged to initiate and continue to follow up with Ophthalmology, Dentist, Podiatrist at least yearly or according to recommendations, and advised to stay away from smoking. I have recommended yearly flu vaccine and pneumonia vaccine at least every 5 years; moderate intensity exercise for up to 150 minutes weekly; and sleep for at least 7 hours a day.  - he is advised to maintain close follow up with Elfredia Nevins, MD for primary care needs, as well as his other providers for optimal and coordinated care.      I spent  43  minutes in the care of the patient today including review of labs from CMP, Lipids, Thyroid Function, Hematology (current and previous including abstractions from other facilities); face-to-face time discussing  his blood glucose readings/logs, discussing  hypoglycemia and hyperglycemia episodes and symptoms, medications doses, his options of short and long term treatment based on the latest standards of care / guidelines;  discussion about incorporating lifestyle medicine;  and documenting the encounter. Risk reduction counseling performed per USPSTF guidelines to reduce obesity and cardiovascular risk factors.     Please refer to Patient Instructions for Blood Glucose Monitoring and Insulin/Medications Dosing Guide"  in media tab for additional information. Please  also refer to " Patient Self Inventory" in the Media  tab for reviewed elements of pertinent patient history.  [name] participated in the discussions, expressed understanding, and voiced agreement with the above plans.  All questions were answered to his satisfaction. he is encouraged to contact clinic should he have any questions or concerns prior to his return visit.     Follow up plan: - Return in about 3 months (around 10/04/2023) for Diabetes F/U with A1c in office, No previsit labs, Bring meter and logs.  Ronny Bacon, [facility] [address] [place], [place] [address] Phone: [phone] Fax: [phone]  07/04/2023, 9:23 AM

## 2023-07-24 ENCOUNTER — Ambulatory Visit: Payer: Medicare HMO | Admitting: Surgery

## 2023-07-24 ENCOUNTER — Encounter: Payer: Self-pay | Admitting: Surgery

## 2023-07-24 ENCOUNTER — Ambulatory Visit (HOSPITAL_COMMUNITY)
Admission: RE | Admit: 2023-07-24 | Discharge: 2023-07-24 | Disposition: A | Discharge status: Home / Self Care | Payer: Medicare HMO | Source: Ambulatory Visit | Attending: Surgery | Admitting: Surgery

## 2023-07-24 ENCOUNTER — Ambulatory Visit (INDEPENDENT_AMBULATORY_CARE_PROVIDER_SITE_OTHER)
Admission: RE | Admit: 2023-07-24 | Discharge: 2023-07-24 | Disposition: A | Discharge status: Home / Self Care | Payer: Medicare HMO | Source: Ambulatory Visit | Attending: Surgery | Admitting: Surgery

## 2023-07-24 VITALS — BP 141/83 | HR 58 | Temp 98.4°F | Resp 20 | Ht 68.0 in | Wt 210.0 lb

## 2023-07-24 DIAGNOSIS — I739 Peripheral vascular disease, unspecified: Secondary | ICD-10-CM

## 2023-07-24 DIAGNOSIS — I70213 Atherosclerosis of native arteries of extremities with intermittent claudication, bilateral legs: Secondary | ICD-10-CM | POA: Diagnosis not present

## 2023-07-24 DIAGNOSIS — T82858A Stenosis of vascular prosthetic devices, implants and grafts, initial encounter: Secondary | ICD-10-CM | POA: Insufficient documentation

## 2023-07-24 LAB — VAS US ABI WITH/WO TBI
Left ABI: 0.9
Right ABI: 0.76

## 2023-07-24 MED ORDER — CILOSTAZOL 100 MG PO TABS
100.0000 mg | ORAL_TABLET | Freq: Two times a day (BID) | ORAL | 11 refills | Status: DC
Start: 2023-07-24 — End: 2024-10-03

## 2023-07-24 NOTE — Progress Notes (Signed)
[facility] [place]  Patient name: Timothy [name] MRN: [id number] DOB: 1946/06/11 Sex: male   REASON FOR VISIT:    Follow up  HISOTRY OF PRESENT ILLNESS:    Timothy [name] is a 77 y.o. male who has undergone the following procedures:  08/28/2018: Atherectomy left SFA and popliteal artery with DCB (claudication) 07/09/2019: Stent, left superficial femoral artery 06/13/2023: Stent, left superficial femoral artery (claudication)  He recently had complaints of bilateral claudication, left greater than right.  At angiography, he had recurrent stenosis on the left which was treated with a stent.  On the right he had a high-grade stenosis at the origin of his posterior tibial artery which was not treated.  Today he states that his left leg symptoms have resolved but he still has symptoms on the right.  He does not have rest pain or open wounds.  The patient has a history of coronary artery disease, status post PCI in 2017.  He is a former smoker.  He takes Repatha for hypercholesterolemia.  He is medically managed for hypertension. PAST MEDICAL HISTORY:   Past Medical History:  Diagnosis Date   Arthritis    Bladder cancer (HCC)    Blood clot in vein 2018   left   CAD (coronary artery disease)    a. 06/2014: NSTEMI s/p PTCA of LCx, 60-80% residual in distal LAD, 70% prox small D1.   b. 02/06/15 NSTEMI  s/p successful PCI/DES to mid RCA c. angioplasty alone to LCx in 11/2016   Decreased GFR 45 % per 01-04-2021 labs   Diabetes mellitus, type 2 (HCC)    Elevated PSA    HLD (hyperlipidemia)    HOH (hard of hearing)    Hypertension    NSTEMI (non-ST elevated myocardial infarction) (HCC) 2015   Peripheral vascular disease (HCC)    Poor historian    Wears hearing aid in right ear      FAMILY HISTORY:   Family History  Problem Relation Age of Onset   Cancer Child    Asthma Mother     SOCIAL HISTORY:   Social History    Tobacco Use   Smoking status: Former    Current packs/day: 0.00    Average packs/day: 1 pack/day for 30.0 years (30.0 ttl pk-yrs)    Types: Cigarettes    Start date: 03/12/1963    Quit date: 03/11/1993    Years since quitting: 30.3   Smokeless tobacco: Never  Substance Use Topics   Alcohol use: No    Alcohol/week: 0.0 standard drinks of alcohol     ALLERGIES:   Allergies  Allergen Reactions   Lipitor [Atorvastatin] Other (See Comments)    Myalgia   Other Other (See Comments)    All Cholesterol Meds - causes muscle and joint pain   Pravastatin Other (See Comments)    Myalgia     CURRENT MEDICATIONS:   Current Outpatient Medications  Medication Sig Dispense Refill   acetaminophen (TYLENOL) 500 MG tablet Take 500 mg by mouth every 6 (six) hours as needed (pain).     aspirin 81 MG chewable tablet Chew 1 tablet (81 mg total) by mouth daily.     carvedilol (COREG) 12.5 MG tablet Take 1 tablet (12.5 mg total) by mouth in the morning and at bedtime. 180 tablet 3   clopidogrel (PLAVIX) 75 MG tablet Take 1 tablet (75 mg total) by mouth daily. 30 tablet 11   diclofenac Sodium (VOLTAREN) 1 % GEL Apply 2 g topically daily  as needed (pain).     empagliflozin (JARDIANCE) 10 MG TABS tablet Take 1 tablet (10 mg total) by mouth daily before breakfast. 90 tablet 1   Evolocumab (REPATHA SURECLICK) 140 MG/ML SOAJ Inject 140 mg into the skin every 14 (fourteen) days. 2 mL 11   glimepiride (AMARYL) 2 MG tablet Take 1 tablet (2 mg total) by mouth 2 (two) times daily. 180 tablet 3   losartan (COZAAR) 25 MG tablet TAKE 1 TABLET (25 MG TOTAL) BY MOUTH DAILY. 90 tablet 3   naproxen sodium (ALEVE) 220 MG tablet Take 220 mg by mouth daily as needed (Pain).     TRADJENTA 5 MG TABS tablet Take 1 tablet (5 mg total) by mouth daily. 90 tablet 1   No current facility-administered medications for this visit.    REVIEW OF SYSTEMS:   [X]  denotes positive finding, [ ]  denotes negative finding Cardiac   Comments:  Chest pain or chest pressure:    Shortness of breath upon exertion:    Short of breath when lying flat:    Irregular heart rhythm:        Vascular    Pain in calf, thigh, or hip brought on by ambulation: x   Pain in feet at night that wakes you up from your sleep:     Blood clot in your veins:    Leg swelling:         Pulmonary    Oxygen at home:    Productive cough:     Wheezing:         Neurologic    Sudden weakness in arms or legs:     Sudden numbness in arms or legs:     Sudden onset of difficulty speaking or slurred speech:    Temporary loss of vision in one eye:     Problems with dizziness:         Gastrointestinal    Blood in stool:     Vomited blood:         Genitourinary    Burning when urinating:     Blood in urine:        Psychiatric    Major depression:         Hematologic    Bleeding problems:    Problems with blood clotting too easily:        Skin    Rashes or ulcers:        Constitutional    Fever or chills:      PHYSICAL EXAM:   Vitals:   07/24/23 1102  BP: (!) 141/83  Pulse: (!) 58  Resp: 20  Temp: 98.4 F (36.9 C)  SpO2: 96%  Weight: 210 lb (95.3 kg)  Height: 5\' 8"  (1.727 m)    GENERAL: The patient is a well-nourished male, in no acute distress. The vital signs are documented above. CARDIAC: There is a regular rate and rhythm.  PULMONARY: Non-labored respirations MUSCULOSKELETAL: There are no major deformities or cyanosis. NEUROLOGIC: No focal weakness or paresthesias are detected. SKIN: There are no ulcers or rashes noted. PSYCHIATRIC: The patient has a normal affect.  STUDIES:   I have reviewed the following: ABI/TBIToday's ABIToday's TBIPrevious ABIPrevious TBI  +-------+-----------+-----------+------------+------------+  Right 0.76       0.43       0.85        0.59          +-------+-----------+-----------+------------+------------+  Left  0.90       0.55  0.90        0.60           +-------+-----------+-----------+------------+------------+  Left: Patent stent with no evidence of stenosis in the superficial femoral  artery.   MEDICAL ISSUES:   Claudication: The patient's symptoms have resolved on the left leg after repeat stenting.  I discussed that he has tibial disease on the right and that I would not treat that for claudication.  I will start him on cilostazol to see if this helps alleviate any of his symptoms.  He is scheduled for surveillance imaging in 6 months    Charlena Cross, MD, FACS [facility] [phone] Pager [phone]

## 2023-08-10 ENCOUNTER — Telehealth: Payer: Self-pay | Admitting: *Deleted

## 2023-08-10 NOTE — Progress Notes (Signed)
  Care Coordination Note  08/10/2023 Name: Timothy [name] MRN: [id number] DOB: 05-18-1946  Timothy [name] is a 77 y.o. year old male who is a primary care patient of Elfredia Nevins, MD and is actively engaged with the care management team. I reached out to [name] by phone today to assist with re-scheduling a follow up visit with the RN Case Manager  Follow up plan: Unsuccessful telephone outreach attempt made. A HIPAA compliant phone message was left for the patient providing contact information and requesting a return call.   [facility]  Care Coordination Care Guide  Direct Dial: [phone]

## 2023-08-14 ENCOUNTER — Other Ambulatory Visit: Payer: Self-pay

## 2023-08-14 DIAGNOSIS — I739 Peripheral vascular disease, unspecified: Secondary | ICD-10-CM

## 2023-08-14 NOTE — Progress Notes (Signed)
  Care Coordination Note  08/14/2023 Name: Timothy [name] MRN: [id number] DOB: [birth year]  Timothy [name] is a 77 y.o. year old male who is a primary care patient of Elfredia Nevins, MD and is actively engaged with the care management team. I reached out to [name] by phone today to assist with scheduling a follow up visit with the RN Case Manager  Follow up plan: Telephone appointment with care management team member scheduled for:09/11/23  [facility] Coordination Care Guide  Direct Dial: [phone]

## 2023-08-15 ENCOUNTER — Ambulatory Visit: Payer: Medicare HMO | Attending: Cardiology | Admitting: Cardiology

## 2023-08-15 ENCOUNTER — Encounter: Payer: Self-pay | Admitting: Cardiology

## 2023-08-15 VITALS — BP 136/72 | HR 65 | Ht 68.0 in | Wt 213.6 lb

## 2023-08-15 DIAGNOSIS — E782 Mixed hyperlipidemia: Secondary | ICD-10-CM

## 2023-08-15 DIAGNOSIS — I2510 Atherosclerotic heart disease of native coronary artery without angina pectoris: Secondary | ICD-10-CM | POA: Diagnosis not present

## 2023-08-15 DIAGNOSIS — I10 Essential (primary) hypertension: Secondary | ICD-10-CM | POA: Diagnosis not present

## 2023-08-15 NOTE — Addendum Note (Signed)
Addended by: Leonides Schanz C on: 08/15/2023 12:07 PM   Modules accepted: Orders

## 2023-08-15 NOTE — Progress Notes (Signed)
Clinical Summary Timothy Timothy [name] is a 77 y.o.male seen today for follow up of the following medical problems.    1. HTN  - norvasc that we started last visit caused foot pain, stopped and resolved.      -last visit patient had concerns hydralazine was causing cramps. We tried stopping, cramps did resolved off medication.   - home bp's 120s/70s -compliant with meds.      2. Hyperlipidemia - intolerant to statins, has been on zetia only. He reports muscle aches on zetia, now off.  - labs followed by pcp   - 01/2020 TC 206 TG 364 HDL 28 LDL 114 -prior authorization for repatha approved  - taking repatha - upcoming labs with pcp     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 Echo LVEF 50-55%, inferior hypokinesis, grade I diastolic dysfunction. - admit 11/2016 with NSTEMI. Cath as reported below. Received balloon angioplasty of mid to distal LCX, too small to be stented.  - echo 11/2016 LVEF 40-45%.       - last visit he was doing well without symptoms but was noted to have new inferior and lateral precordial TWIs, and lateral Qwaves - referred for stress test. Jan 2019 nuclear stress small moderate intensity partially reversible mid to apical anterior defect consistent with mild ischemia. LVEF 25% by nuclear, however echo 02/2018 showed LVEF stabel 40-45%.      07/2022 echo: LVEF 45-50% - Timothy [name]. Timothy [name]    4. PAD - 07/2018 ABIs right 1.12, left 0.8 - followed by vascular. Has had left SFA stent.   - Jan 2021 normal ABIs, normal left SFA stent.       06/2023 stent to left SFA - not interseted in SET program.    5. History of bladder cancer - followed by urology, s/p recesection Notes mention low volume prostate cancer as well   AAA screen: neg [place] 2018 Past Medical History:  Diagnosis Date   Arthritis    Bladder cancer (HCC)    Blood clot in vein 2018   left   CAD (coronary artery disease)    a. 06/2014: NSTEMI s/p  PTCA of LCx, 60-80% residual in distal LAD, 70% prox small D1.   b. 02/06/15 NSTEMI  s/p successful PCI/DES to mid RCA c. angioplasty alone to LCx in 11/2016   Decreased GFR 45 % per 01-04-2021 labs   Diabetes mellitus, type 2 (HCC)    Elevated PSA    HLD (hyperlipidemia)    HOH (hard of hearing)    Hypertension    NSTEMI (non-ST elevated myocardial infarction) (HCC) 2015   Peripheral vascular disease (HCC)    Poor historian    Wears hearing aid in right ear      Allergies  Allergen Reactions   Lipitor [Atorvastatin] Other (See Comments)    Myalgia   Other Other (See Comments)    All Cholesterol Meds - causes muscle and joint pain   Pravastatin Other (See Comments)    Myalgia     Current Outpatient Medications  Medication Sig Dispense Refill   acetaminophen (TYLENOL) 500 MG tablet Take 500 mg by mouth every 6 (six) hours as needed (pain).     aspirin 81 MG chewable tablet Chew 1 tablet (81 mg total) by mouth daily.     carvedilol (COREG) 12.5 MG tablet Take 1 tablet (12.5 mg total) by mouth in the morning and at bedtime. 180 tablet 3  cilostazol (PLETAL) 100 MG tablet Take 1 tablet (100 mg total) by mouth 2 (two) times daily before a meal. 60 tablet 11   clopidogrel (PLAVIX) 75 MG tablet Take 1 tablet (75 mg total) by mouth daily. 30 tablet 11   diclofenac Sodium (VOLTAREN) 1 % GEL Apply 2 g topically daily as needed (pain).     empagliflozin (JARDIANCE) 10 MG TABS tablet Take 1 tablet (10 mg total) by mouth daily before breakfast. 90 tablet 1   Evolocumab (REPATHA SURECLICK) 140 MG/ML SOAJ Inject 140 mg into the skin every 14 (fourteen) days. 2 mL 11   glimepiride (AMARYL) 2 MG tablet Take 1 tablet (2 mg total) by mouth 2 (two) times daily. 180 tablet 3   losartan (COZAAR) 25 MG tablet TAKE 1 TABLET (25 MG TOTAL) BY MOUTH DAILY. 90 tablet 3   naproxen sodium (ALEVE) 220 MG tablet Take 220 mg by mouth daily as needed (Pain).     TRADJENTA 5 MG TABS tablet Take 1 tablet (5 mg  total) by mouth daily. 90 tablet 1   Timothy [name]     Past Surgical History:  Procedure Laterality Date   ABDOMINAL AORTOGRAM W/LOWER EXTREMITY N/A 08/28/2018   Procedure: ABDOMINAL AORTOGRAM W/LOWER EXTREMITY;  Surgeon: Nada Libman, MD;  Location: [facility];  Service: Cardiovascular;  Laterality: N/A;  bilateral   ABDOMINAL AORTOGRAM W/LOWER EXTREMITY N/A 07/09/2019   Procedure: ABDOMINAL AORTOGRAM W/LOWER EXTREMITY;  Surgeon: Nada Libman, MD;  Location: [facility];  Service: Cardiovascular;  Laterality: N/A;  unilateral Lt   ABDOMINAL AORTOGRAM W/LOWER EXTREMITY N/A 06/13/2023   Procedure: ABDOMINAL AORTOGRAM W/LOWER EXTREMITY;  Surgeon: Nada Libman, MD;  Location: [facility];  Service: Cardiovascular;  Laterality: N/A;   CARDIAC CATHETERIZATION  02/06/2015   Procedure: CORONARY STENT INTERVENTION;  Surgeon: Marykay Lex, MD;  Location: [facility] CATH LAB;  Service: Cardiovascular;;  Mid RCA   CARDIAC CATHETERIZATION N/A 11/11/2016   Procedure: Left Heart Cath and Coronary Angiography;  Surgeon: Corky Crafts, MD;  Location: [facility] INVASIVE CV LAB;  Service: Cardiovascular;  Laterality: N/A;   CARDIAC CATHETERIZATION N/A 11/11/2016   Procedure: Coronary Balloon Angioplasty;  Surgeon: Corky Crafts, MD;  Location: [facility] INVASIVE CV LAB;  Service: Cardiovascular;  Laterality: N/A;   COLONOSCOPY N/A 01/14/2019   Procedure: COLONOSCOPY;  Surgeon: Malissa Hippo, MD;  Location: [facility];  Service: Endoscopy;  Laterality: N/A;  730   CORONARY ANGIOPLASTY  11/11/2016   Mid Cx to Dist Cx lesion, 100 %stenosed. This lesion was treated with balloon angioplasty with a 2.0 balloon   CYSTOSCOPY W/ RETROGRADES N/A 05/05/2015   Procedure: CYSTOSCOPY WITH BILATERAL RETROGRADE PYELOGRAMS AND BLADDER BIOPSIES;  Surgeon: Marcine Matar, MD;  Location: [facility];  Service: Urology;  Laterality: N/A;   CYSTOSCOPY W/  RETROGRADES Bilateral 07/31/2018   Procedure: CYSTOSCOPY WITH BILATERAL RETROGRADE PYELOGRAM;  Surgeon: Marcine Matar, MD;  Location: [facility];  Service: Urology;  Laterality: Bilateral;   CYSTOSCOPY W/ RETROGRADES Right 02/15/2021   Procedure: CYSTOSCOPY WITH RETROGRADE PYELOGRAM;  Surgeon: Marcine Matar, MD;  Location: [facility];  Service: Urology;  Laterality: Right;   CYSTOSCOPY WITH BIOPSY N/A 07/31/2018   Procedure: CYSTOSCOPY WITH BIOPSY;  Surgeon: Marcine Matar, MD;  Location: [facility];  Service: Urology;  Laterality: N/A;   CYSTOSCOPY WITH BIOPSY N/A 02/15/2021   Procedure: CYSTOSCOPY WITH BIOPSY;  Surgeon: Marcine Matar, MD;  Location: [facility];  Service: Urology;  Laterality: N/A;   LEFT HEART CATHETERIZATION WITH CORONARY ANGIOGRAM N/A 06/17/2014   Procedure: LEFT HEART CATHETERIZATION WITH CORONARY ANGIOGRAM;  Surgeon: Marykay Lex, MD;  Location: [facility] CATH LAB;  Service: Cardiovascular;  Laterality: N/A;   LEFT HEART CATHETERIZATION WITH CORONARY ANGIOGRAM N/A 02/06/2015   Procedure: LEFT HEART CATHETERIZATION WITH CORONARY ANGIOGRAM;  Surgeon: Marykay Lex, MD;  Location: [facility] CATH LAB;  Service: Cardiovascular;  Laterality: N/A;   PERIPHERAL VASCULAR ATHERECTOMY  08/28/2018   Procedure: PERIPHERAL VASCULAR ATHERECTOMY;  Surgeon: Nada Libman, MD;  Location: [facility];  Service: Cardiovascular;;  Prox lt.SFA, Distal SFA   PERIPHERAL VASCULAR INTERVENTION  07/09/2019   Procedure: PERIPHERAL VASCULAR INTERVENTION;  Surgeon: Nada Libman, MD;  Location: [facility];  Service: Cardiovascular;;  Lt. SFA   PERIPHERAL VASCULAR INTERVENTION  06/13/2023   Procedure: PERIPHERAL VASCULAR INTERVENTION;  Surgeon: Nada Libman, MD;  Location: [facility];  Service: Cardiovascular;;  Lt SFA   POLYPECTOMY  01/14/2019   Procedure: POLYPECTOMY;  Surgeon: Malissa Hippo, MD;  [facility];  Service: Endoscopy;;   colon   PROSTATE BIOPSY N/A 03/31/2014   Procedure: BIOPSY TRANSRECTAL ULTRASONIC PROSTATE (TUBP);  Surgeon: Marcine Matar, MD;  Location[facility];  Service: Urology;  Laterality: N/A;   PROSTATE BIOPSY N/A 02/15/2021   Procedure: BIOPSY TRANSRECTAL ULTRASONIC PROSTATE (TUBP);  Surgeon: Marcine Matar, MD;  Location: [facility];  Service: Urology;  Laterality: N/A;  45 MINS   TRANSURETHRAL RESECTION OF BLADDER TUMOR N/A 03/14/2013   Procedure: TRANSURETHRAL RESECTION OF BLADDER TUMOR (TURBT);  Surgeon: Marcine Matar, MD;  Location: [facility];  Service: Urology;  Laterality: N/A;  1 HR WITH MITOMYCIN INSTILLATION    TRANSURETHRAL RESECTION OF BLADDER TUMOR N/A 09/30/2013   Procedure: TRANSURETHRAL RESECTION OF BLADDER TUMOR (TURBT);  Surgeon: Marcine Matar, MD;  Location: [facility];  Service: Urology;  Laterality: N/A;   TRANSURETHRAL RESECTION OF BLADDER TUMOR N/A 03/31/2014   Procedure: TRANSURETHRAL RESECTION OF BLADDER TUMOR (TURBT);  Surgeon: Marcine Matar, MD;  Location: [facility];  Service: Urology;  Laterality: N/A;   TRANSURETHRAL RESECTION OF BLADDER TUMOR N/A 05/31/2015   Procedure: Cystoscopy with clott evacuation and fulgeration of bleeders, retrograde pylegram;  Surgeon: Sebastian Ache, MD;  Location: [facility];  Service: Urology;  Laterality: N/A;     Allergies  Allergen Reactions   Lipitor [Atorvastatin] Other (See Comments)    Myalgia   Other Other (See Comments)    All Cholesterol Meds - causes muscle and joint pain   Pravastatin Other (See Comments)    Myalgia      Family History  Problem Relation Age of Onset   Cancer Child    Asthma Mother      Social History Timothy Timothy [name] reports that he quit smoking about 30 years ago. His smoking use included cigarettes. He started smoking about 60 years ago. He has a 30 pack-year smoking history. He has never used smokeless  tobacco. Timothy Timothy [name] reports Timothy [name] history of alcohol use.   Review of Systems CONSTITUTIONAL: Timothy [name] weight loss, fever, chills, weakness or fatigue.  HEENT: Eyes: Timothy [name] visual loss, blurred vision, double vision or yellow sclerae.Timothy [name] hearing loss, sneezing, congestion, runny nose or sore throat.  SKIN: Timothy [name] rash or itching.  CARDIOVASCULAR: per hpi RESPIRATORY: Timothy [name] shortness of breath, cough or sputum.  GASTROINTESTINAL: Timothy [name] anorexia, nausea, vomiting or diarrhea. Timothy [name] abdominal pain  or blood.  GENITOURINARY: Timothy [name] burning on urination, Timothy [name] polyuria NEUROLOGICAL: Timothy [name] headache, dizziness, syncope, paralysis, ataxia, numbness or tingling in the extremities. Timothy [name] change in bowel or bladder control.  MUSCULOSKELETAL: Timothy [name] muscle, back pain, joint pain or stiffness.  LYMPHATICS: Timothy [name] enlarged nodes. Timothy [name] history of splenectomy.  PSYCHIATRIC: Timothy [name] history of depression or anxiety.  ENDOCRINOLOGIC: Timothy [name] reports of sweating, cold or heat intolerance. Timothy [name] polyuria or polydipsia.  Marland Kitchen   Physical Examination Today's Vitals   08/15/23 1134  BP: 136/72  Pulse: 65  SpO2: 98%  Weight: 213 lb 9.6 oz (96.9 kg)  Height: 5\' 8"  (1.727 m)   Body mass index is 32.48 kg/m.  Gen: resting comfortably, Timothy [name] acute distress HEENT: Timothy [name] scleral icterus, pupils equal round and reactive, Timothy [name] palptable cervical adenopathy,  CV: RRR, Timothy [name] m/rg, Timothy [name] jvd Resp: Clear to auscultation bilaterally GI: abdomen is soft, non-tender, non-distended, normal bowel sounds, Timothy [name] hepatosplenomegaly MSK: extremities are warm, Timothy [name] edema.  Skin: warm, Timothy [name] rash Neuro:  Timothy [name] focal deficits Psych: appropriate affect   Diagnostic Studies  06/2014 Cath FINDINGS:   Hemodynamics:   Central Aortic Pressure / Mean: 108/62/83 mmHg Left Ventricular Pressure / LVEDP: 107/7/17 mmHg   Left Ventriculography: EF: ~45% % Wall Motion: global Hypokinesis   Coronary Anatomy: Dominance: Right Left Main: Large caliber, long trunk that trifurcates into the LAD, Circumflex & Ramus  Intermedius. Angiographically normal. LAD: Begins as a large caliber vessel that tapers into a relatively small caliber (~1.5-2.0 mm) vessel distally.  It wraps the apex, perfusing the distal 1/3 of the infero-apex. The distal vessel has tandem 60&70-80% focal lesions prior to the apex. D1: Very small caliber bifurcating vessel with proximal ~70% stenosis.   Left Circumflex: Begins as a large caliber vessel that also tapers to a small to moderate caliber vessel in the AV Groove where it gives off a small caliber OM1 Timothy Timothy [name] and is 100% occluded just beyond that point.    Post-PTCA: beyond the occluded AV Groove Circumflex, there is a small to moderate caliber LPL Timothy Timothy [name].   Ramus intermedius: Large caliber vessel that bifurcates in the mid-vessel into to small to moderate caliber branches that both reach the apex.    RCA: Large caliber, dominant vessel with diffuse ~20-40% lesions, but Timothy [name] obstructive disease.  It bifurcates distally in to a small caliber, short PDA and Right Posterior AV Groove Timothy Timothy [name] (RPAV) branches.  RPAV gives off several very small banches.   After reviewing the initial angiography, the culprit lesion was thought to be the occluded distal-AV Groove circumflesx.  Preparation were made to proceed with PTCA on this lesion.   Percutaneous Coronary Intervention:   Guide: 6 Fr   CLS 3.5  -- very difficult guide support, catheter moved & disengaged with patient movement Guidewire: BMW (advanced initially in to an atrial Timothy Timothy [name] for support & balloon advanced into proximal Circumflex allowing for further advancmemt of the wire beyond the culprit lesion. Predilation Balloon #1: Emerge 1.5 mm x 12 mm; Used to assist with wire support to advance guidewire 6 Atm x 30 Sec, 8 Atm x 30 Sec Post inflation revealed a small to moderate follow-on circumflex with LPL Timothy Timothy [name].    Due to the relatively small sized distal vessel, the decision was to perform PTCA only. Balloon #2: Euphora 2.0 mm x 12  mm;   8 Atm x 90 Sec x 2 with IC NTG - Timothy [name] recoil Final Diameter: ~2.0 mm   Post deployment angiography in multiple views, with and without guidewire  in place revealed excellent stent deployment and lesion coverage.  There was Timothy [name] evidence of dissection or perforation.   POST-OPERATIVE DIAGNOSIS:   Severe 2 vessel disease in small caliber vessel - distal AV Groove Circumflex 100% and distal LAD ~60&80% lesions (1.5 mm vessel) Successful PTCA of the occluded AV Groove Circumflex with Stent-like result. Moderately reduced LVEF by LV Gram Normal LVEDP     06/2014 Echo Study Conclusions  - Left ventricle: The cavity size was normal. Wall thickness was   normal. Systolic function was normal. The estimated ejection   fraction was in the range of 50% to 55%. Inferior hypokinesis.   Doppler parameters are consistent with abnormal left ventricular   relaxation (grade 1 diastolic dysfunction). The E/e&' ratio is   15, suggesting borderline elevated LV filling pressure. - Aortic valve: Trileaflet. Sclerosis without stenosis. There was   Timothy [name] regurgitation. - Left atrium: The atrium was normal in size. - Tricuspid valve: There was mild regurgitation. - Pulmonary arteries: PA peak pressure: 33 mm Hg (S).  Impressions:  - LVEF 50-55%, inferior hypokinesis, mild TR, top normal RVSP,   diastolic dysfunction with borderline elevated LV filling   pressure.     11/2016 echo Study Conclusions   - Left ventricle: The cavity size was normal. Wall thickness was   normal. Systolic function was mildly to moderately reduced. The   estimated ejection fraction was in the range of 40% to 45%. There   is akinesis of the basal-midinferior and inferoseptal myocardium.   Doppler parameters are consistent with abnormal left ventricular   relaxation (grade 1 diastolic dysfunction). - Aortic valve: Trileaflet; mildly thickened, mildly calcified   leaflets. There was trivial regurgitation.   Impressions:   -  EF is mildly reduced when compared to prior.     11/2016 cath There is mild left ventricular systolic dysfunction. The left ventricular ejection fraction is 45-50% by visual estimate. Mid RCA-2 lesion, 25 %stenosed. Patent mid RCA stent. Ost 2nd Diag to 2nd Diag lesion, 80 %stenosed. Unchanged from prior. Dist LAD lesion, 60 %stenosed. Unchanged from prior. Mid Cx to Dist Cx lesion, 100 %stenosed. This lesion was treated with balloon angioplasty with a 2.0 balloon. Several inflations were performed. Post intervention, there is a 0% residual stenosis. The vessel was too small to be stented. There is also significant tortuosity in the proximal vessel. LV end diastolic pressure is mildly elevated.   Initially, Brilinta was given for antiplatelet therapy in the Cath Lab. Since he did not receive a stent, we will transition him to clopidogrel.  He needs aggressive secondary prevention including lipid-lowering therapy. He has been intolerant to statins in the past. Would need to consider PCS K9 inhibitor along with other secondary prevention. I anticipate he'll be here 2 days in the hospital. Echocardiogram would be reasonable to check ejection fraction and mitral apparatus.   LAD and diagonal disease was not addressed since they appeared unchanged angiographically. If he has refractory angina, could address the LAD lesion at a later time.   Jan 2018 AAA [place] Timothy [name] AAA     Jan 2019 nuclear stress Timothy [name] diagnostic ST segment changes. Occasional PVCs noted throughout the study. Hypertensive response. Technically low risk Duke treadmill score 4.5. Blood pressure demonstrated a hypertensive response to exercise. Small, moderate intensity, partially reversible mid to apical anterior defect consistent with ischemia. This is a high risk study predominantly based on calculated LVEF. Consider echocardiogram for further confirmation. Nuclear stress EF: 25%.     02/2018 echo  Study Conclusions   - Left  ventricle: Mid and basal inferior wall hypokinesis. The   cavity size was mildly dilated. Wall thickness was normal.   Systolic function was mildly to moderately reduced. The estimated   ejection fraction was in the range of 40% to 45%. - Aortic valve: There was trivial regurgitation. Valve area (VTI):   1.51 cm^2. Valve area (Vmax): 1.75 cm^2. Valve area (Vmean): 1.64   cm^2. - Atrial septum: Timothy [name] defect or patent foramen ovale was identified. - Pulmonary arteries: PA peak pressure: 37 mm Hg (S).     07/2022 echo 1. Left ventricular ejection fraction, by estimation, is 45 to 50%. The  left ventricle has mildly decreased function. The left ventricle has Timothy [name]  regional wall motion abnormalities. Left ventricular diastolic parameters  are indeterminate. The average left   ventricular global longitudinal strain is -17.3 %.   2. Right ventricular systolic function is normal. The right ventricular  size is normal. Tricuspid regurgitation signal is inadequate for assessing  PA pressure.   3. The mitral valve is normal in structure. Mild mitral valve  regurgitation. Timothy [name] evidence of mitral stenosis.   4. The tricuspid valve is abnormal.   5. The aortic valve is tricuspid. There is mild calcification of the  aortic valve. There is mild thickening of the aortic valve. Aortic valve  regurgitation is mild. Aortic valve sclerosis/calcification is present,  without any evidence of aortic  stenosis.   6. The inferior vena cava is normal in size with greater than 50%  respiratory variability, suggesting right atrial pressure of 3 mmHg.    Assessment and Plan  1. HTN - at goal, continue current meds   2. Hyperlipidemia - history of CAD and PAD - intolerant to statins and zetia.  - on repatha, he reports upcoming labs with pcp next week - continue current meds   3. CAD/ICM - Timothy [name] recent symptoms. Medical therapy limited by renal dysfunction and mixed compliance.  - we will continue current meds -  EKG shows NSR, chronic ST/T changes         Timothy Timothy [name], M.D.

## 2023-08-15 NOTE — Patient Instructions (Signed)
Medication Instructions:  Your physician has recommended you make the following change in your medication:   -Stop Naproxen   Labwork: None  Testing/Procedures: None  Follow-Up: 6 month follow up with Dr. Wyline Mood  Any Other Special Instructions Will Be Listed Below (If Applicable).     If you need a refill on your cardiac medications before your next appointment, please call your pharmacy.

## 2023-08-24 DIAGNOSIS — I10 Essential (primary) hypertension: Secondary | ICD-10-CM | POA: Diagnosis not present

## 2023-08-24 DIAGNOSIS — E6609 Other obesity due to excess calories: Secondary | ICD-10-CM | POA: Diagnosis not present

## 2023-08-24 DIAGNOSIS — Z6833 Body mass index (BMI) 33.0-33.9, adult: Secondary | ICD-10-CM | POA: Diagnosis not present

## 2023-08-24 DIAGNOSIS — Z8551 Personal history of malignant neoplasm of bladder: Secondary | ICD-10-CM | POA: Diagnosis not present

## 2023-08-24 DIAGNOSIS — Z0001 Encounter for general adult medical examination with abnormal findings: Secondary | ICD-10-CM | POA: Diagnosis not present

## 2023-08-24 DIAGNOSIS — E782 Mixed hyperlipidemia: Secondary | ICD-10-CM | POA: Diagnosis not present

## 2023-08-24 DIAGNOSIS — Z1331 Encounter for screening for depression: Secondary | ICD-10-CM | POA: Diagnosis not present

## 2023-08-24 DIAGNOSIS — Z23 Encounter for immunization: Secondary | ICD-10-CM | POA: Diagnosis not present

## 2023-08-24 DIAGNOSIS — E1100 Type 2 diabetes mellitus with hyperosmolarity without nonketotic hyperglycemic-hyperosmolar coma (NKHHC): Secondary | ICD-10-CM | POA: Diagnosis not present

## 2023-08-24 DIAGNOSIS — I2510 Atherosclerotic heart disease of native coronary artery without angina pectoris: Secondary | ICD-10-CM | POA: Diagnosis not present

## 2023-08-24 DIAGNOSIS — K219 Gastro-esophageal reflux disease without esophagitis: Secondary | ICD-10-CM | POA: Diagnosis not present

## 2023-09-11 ENCOUNTER — Ambulatory Visit: Payer: Self-pay | Admitting: *Deleted

## 2023-09-11 ENCOUNTER — Encounter: Payer: Self-pay | Admitting: *Deleted

## 2023-09-11 NOTE — Patient Outreach (Signed)
Care Coordination   Follow Up Visit Note   09/11/2023 Name: Timothy [name] MRN: [id number] DOB: 12-18-1945  Timothy [name] is a 77 y.o. year old male who sees Elfredia Nevins, MD for primary care. I spoke with  [name] by phone today.  What matters to the patients health and wellness today?  Getting medications at a more affordable price and managing blood sugar.     Goals Addressed               This Visit's Progress     Patient Stated     Effective DM management (pt-stated)   On track     Care Coordination Goals: Patient will follow-up with endocrinologist every 3 months or as recommended Patient will take medication as prescribed and reach out to provider with any negative side effects Patient will continue to monitor and record blood sugar daily and as needed with glucometer, and will call endocrinologist with any readings outside of recommended range Patient will take blood sugar log and meter to provider visits for review Patient will follow a modified carbohydrate diet and decrease simple carbohydrates and sugars. Eat 3 meals per day with 30 grams of carbohydrates and up to 2 snacks per day, if needed, with less than 15 grams of carbohydrates.  Patient will increase activity level as tolerated with an ultimate goal of at least 150 minutes of exercise per week Patient will check feet daily for sores, wounds, calluses, etc and will notify provider of any abnormal findings Patient will have yearly eye exams to check for or monitor diabetic retinopathy Patient will reach out to RN Care Coordinator [phone] with any care coordination or resource needs        Other     Manage Blood Pressure   On track     Care Coordination Goals: Patient will take medications as directed and report any negative side effects to provider  Patient will use a pill box/organizer to help keep up with when to take medications Patient will monitor and record blood pressure daily  and as needed and will call PCP or specialist with any readings outside of recommended range Patient will keep all recommended follow-up appointments with PCP and specialists (cardiology, nephrology, etc) Patient will take blood pressure log to PCP and specialty appointments for review Patient will follow a low sodium/DASH diet  Patient will reach out to RN Care Coordinator [phone] with any care coordination or resource needs          SDOH assessments and interventions completed:  Yes  SDOH Interventions Today    Flowsheet Row Most Recent Value  SDOH Interventions   Food Insecurity Interventions Intervention Not Indicated  Transportation Interventions Intervention Not Indicated  Financial Strain Interventions Intervention Not Indicated  Physical Activity Interventions Intervention Not Indicated        Care Coordination Interventions:  Yes, provided  Interventions Today    Flowsheet Row Most Recent Value  Chronic Disease   Chronic disease during today's visit Diabetes, Hypertension (HTN), Other  [BPH]  General Interventions   General Interventions Discussed/Reviewed General Interventions Discussed, General Interventions Reviewed, Annual Eye Exam, Labs, Durable Medical Equipment (DME), Annual Foot Exam, Doctor Visits, Communication with  Labs Hgb A1c every 3 months  Doctor Visits Discussed/Reviewed Doctor Visits Reviewed, Doctor Visits Discussed, PCP, Specialist  Durable Medical Equipment (DME) Glucomoter, BP Cuff  [BP 130/72. Blood sugar 142]  PCP/Specialist Visits Compliance with follow-up visit  [Urologist, 09/12/23, Endocrinologist 10/09/23]  Communication with PCP/Specialists  [Staff message  to [facility] Clinical Pool Re: status of prescription assistance applications for Jardiance, Tradjenta, and Repatha]  Exercise Interventions   Exercise Discussed/Reviewed Exercise Discussed, Exercise Reviewed, Physical Activity  Physical Activity Discussed/Reviewed  Physical Activity Reviewed, Physical Activity Discussed  [encouraged to increase activity level as tolerated with an ultimate goal of at least 150 minutes per week]  Education Interventions   Education Provided Provided Education, Provided Printed Education  [printed education on Medicare Extra Help for Prescription Drug Coverage]  Provided Verbal Education On Nutrition, Foot Care, Eye Care, Labs, Applications, Blood Sugar Monitoring, When to see the doctor, Exercise  [How to apply for Medicare Extra Help for Prescription Drugs]  Labs Reviewed Hgb A1c  [07/04/23 A1C 8.0%]  Mental Health Interventions   Mental Health Discussed/Reviewed Mental Health Discussed, Mental Health Reviewed  [No mental heath concerns at this time]  Nutrition Interventions   Nutrition Discussed/Reviewed Nutrition Discussed, Nutrition Reviewed, Carbohydrate meal planning, Portion sizes, Fluid intake, Adding fruits and vegetables, Increasing proteins, Decreasing salt, Decreasing sugar intake  [Eat 3 meals per day with 30 GM of CHO and up to 2 snacks per day, if needed, with less than 15 GM of CHO]  Pharmacy Interventions   Pharmacy Dicussed/Reviewed Pharmacy Topics Discussed, Pharmacy Topics Reviewed, Medications and their functions, Affording Medications  [taking medciations as directed. Rayne Du, and Repatha are expensive. Working with endocrinology office regaridng prescription assistance and has completed and turned applications back into them. Explained Medicare Extra Help.]  Safety Interventions   Safety Discussed/Reviewed Safety Discussed, Safety Reviewed, Fall Risk  Advanced Directive Interventions   Advanced Directives Discussed/Reviewed Advanced Directives Discussed, Advanced Directives Reviewed       Follow up plan: Follow up call scheduled for 09/26/23    Encounter Outcome:  Patient Visit Completed   Timothy Loll, RN, BSN Care Management Coordinator [facility]  [facility] Direct  Dial: [phone] Main #: [phone]

## 2023-09-11 NOTE — Progress Notes (Signed)
History of Present Illness:     Timothy [name] presents for the following issues   BPH:    Patient is currently treated with Alfuzosin for his symptoms. His symptoms have been stable over the last year.        Bladder Cancer:   Initial TURBT on 4.10.2014. He had papillary, low-grade, NMIBC. He eventually was found to have recurrence within 6 months, and underwent repeat TURBT on 10.27.2014. Again, this was low-grade, NMIBC. Because of his recurrence in a short period of time, despite using mitomycin after his first and second treatments, he underwent BCG induction, which was completed in January, 2014. He did have a significant inflammatory response to the mitomycin.    In April, 2015 at routine follow-up, he was found to have 2 recurrences, one approximately 1.5 and 1 approximately 1.0 cm. He underwent TURBT on 4.27.2015. This revealed high-grade NMIBC. He completed a second BCG induction course in early August, 2015. He subsequently underwent cystoscopy, bilateral retrogrades, and bladder biopsy on 5.3.2016. Biopsies of the bladder were negative. Retrograde studies were normal. He has had significant dysuria during his treatments. He has completed 3 maintenance BCG treatments, the last of which was 11.29.2016.    Because of the patient's significant side effects from his BCG i.e. dysuria, bladder pain, we stopped maintenance therapy after his 11.29.2016 dose.    8.26.2019: Because of positive cytologies, he underwent cystoscopy, bilateral retrogrades/renal washings, bladder barbotage and bladder biopsy. Bladder washings were negative, barbotage specimen negative, biopsies revealed only benign/inflammatory change.     3.14.2022: Repeat anesthetic cystoscopy and biopsy revealed benign urothelial changes with chronic inflammation.  No carcinoma identified.    Prostate Cancer:   He is participating in active surveillance.    Because of a rising PSA, he underwent a concurrent transrectal  ultrasound and biopsy of his prostate on 4.27.2015. Prostatic volume was 53.55 cm. PSA was 9.13. PSA density 0.17. 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 That biopsy was left base lateral, and less than 5% of the core was involved. There was no granulomatous reaction noted. He underwent repeat/surveillance biopsy on 1.8.2015. Prostatic volume was measured at 53.5 mL. All 12 cores returned negative for adenocarcinoma, with 1 core in the right apex revealing chronic granulomatous inflammation.    2.11.2020: PSA 8.7  3.23.2021: PSA 6.2 11.2.2021: PSA 8.0   3.14.2022: Repeat ultrasound and biopsy of the prostate.  No adenocarcinoma found.  Most cores had focal inflammation and granulomas.   5.9.2023: PSA 5.4 4.9.2024:  Past Medical History:  Diagnosis Date   Arthritis    Bladder cancer (HCC)    Blood clot in vein 2018   left   CAD (coronary artery disease)    a. 06/2014: NSTEMI s/p PTCA of LCx, 60-80% residual in distal LAD, 70% prox small D1.   b. 02/06/15 NSTEMI  s/p successful PCI/DES to mid RCA c. angioplasty alone to LCx in 11/2016   Decreased GFR 45 % per 01-04-2021 labs   Diabetes mellitus, type 2 (HCC)    Elevated PSA    HLD (hyperlipidemia)    HOH (hard of hearing)    Hypertension    NSTEMI (non-ST elevated myocardial infarction) (HCC) 2015   Peripheral vascular disease (HCC)    Poor historian    Wears hearing aid in right ear     Past Surgical History:  Procedure Laterality Date   ABDOMINAL AORTOGRAM W/LOWER EXTREMITY N/A 08/28/2018   Procedure: ABDOMINAL AORTOGRAM W/LOWER EXTREMITY;  Surgeon: Nada Libman, MD;  Location: [facility]  INVASIVE CV LAB;  Service: Cardiovascular;  Laterality: N/A;  bilateral   ABDOMINAL AORTOGRAM W/LOWER EXTREMITY N/A 07/09/2019   Procedure: ABDOMINAL AORTOGRAM W/LOWER EXTREMITY;  Surgeon: Nada Libman, MD;  Location: [facility] INVASIVE CV LAB;  Service: Cardiovascular;  Laterality: N/A;  unilateral Lt   ABDOMINAL AORTOGRAM W/LOWER EXTREMITY N/A 06/13/2023    Procedure: ABDOMINAL AORTOGRAM W/LOWER EXTREMITY;  Surgeon: Nada Libman, MD;  Location: [facility] INVASIVE CV LAB;  Service: Cardiovascular;  Laterality: N/A;   CARDIAC CATHETERIZATION  02/06/2015   Procedure: CORONARY STENT INTERVENTION;  Surgeon: Marykay Lex, MD;  Location: [facility] CATH LAB;  Service: Cardiovascular;;  Mid RCA   CARDIAC CATHETERIZATION N/A 11/11/2016   Procedure: Left Heart Cath and Coronary Angiography;  Surgeon: Corky Crafts, MD;  Location: [facility] INVASIVE CV LAB;  Service: Cardiovascular;  Laterality: N/A;   CARDIAC CATHETERIZATION N/A 11/11/2016   Procedure: Coronary Balloon Angioplasty;  Surgeon: Corky Crafts, MD;  Location: [facility] INVASIVE CV LAB;  Service: Cardiovascular;  Laterality: N/A;   COLONOSCOPY N/A 01/14/2019   Procedure: COLONOSCOPY;  Surgeon: Malissa Hippo, MD;  Location: [facility] ENDO SUITE;  Service: Endoscopy;  Laterality: N/A;  730   CORONARY ANGIOPLASTY  11/11/2016   Mid Cx to Dist Cx lesion, 100 %stenosed. This lesion was treated with balloon angioplasty with a 2.0 balloon   CYSTOSCOPY W/ RETROGRADES N/A 05/05/2015   Procedure: CYSTOSCOPY WITH BILATERAL RETROGRADE PYELOGRAMS AND BLADDER BIOPSIES;  Surgeon: Marcine Matar, MD;  Location: [facility] ORS;  Service: Urology;  Laterality: N/A;   CYSTOSCOPY W/ RETROGRADES Bilateral 07/31/2018   Procedure: CYSTOSCOPY WITH BILATERAL RETROGRADE PYELOGRAM;  Surgeon: Marcine Matar, MD;  Location: [facility];  Service: Urology;  Laterality: Bilateral;   CYSTOSCOPY W/ RETROGRADES Right 02/15/2021   Procedure: CYSTOSCOPY WITH RETROGRADE PYELOGRAM;  Surgeon: Marcine Matar, MD;  Location[facility];  Service: Urology;  Laterality: Right;   CYSTOSCOPY WITH BIOPSY N/A 07/31/2018   Procedure: CYSTOSCOPY WITH BIOPSY;  Surgeon: Marcine Matar, MD;  Location: [facility];  Service: Urology;  Laterality: N/A;   CYSTOSCOPY WITH BIOPSY N/A 02/15/2021   Procedure: CYSTOSCOPY WITH BIOPSY;  Surgeon: Marcine Matar, MD;   Location: [facility];  Service: Urology;  Laterality: N/A;   LEFT HEART CATHETERIZATION WITH CORONARY ANGIOGRAM N/A 06/17/2014   Procedure: LEFT HEART CATHETERIZATION WITH CORONARY ANGIOGRAM;  Surgeon: Marykay Lex, MD;  Location: [facility] CATH LAB;  Service: Cardiovascular;  Laterality: N/A;   LEFT HEART CATHETERIZATION WITH CORONARY ANGIOGRAM N/A 02/06/2015   Procedure: LEFT HEART CATHETERIZATION WITH CORONARY ANGIOGRAM;  Surgeon: Marykay Lex, MD;  Location: [facility] CATH LAB;  Service: Cardiovascular;  Laterality: N/A;   PERIPHERAL VASCULAR ATHERECTOMY  08/28/2018   Procedure: PERIPHERAL VASCULAR ATHERECTOMY;  Surgeon: Nada Libman, MD;  Location: [facility] INVASIVE CV LAB;  Service: Cardiovascular;;  Prox lt.SFA, Distal SFA   PERIPHERAL VASCULAR INTERVENTION  07/09/2019   Procedure: PERIPHERAL VASCULAR INTERVENTION;  Surgeon: Nada Libman, MD;  Location: [facility] INVASIVE CV LAB;  Service: Cardiovascular;;  Lt. SFA   PERIPHERAL VASCULAR INTERVENTION  06/13/2023   Procedure: PERIPHERAL VASCULAR INTERVENTION;  Surgeon: Nada Libman, MD;  Location: [facility] INVASIVE CV LAB;  Service: Cardiovascular;;  Lt SFA   POLYPECTOMY  01/14/2019   Procedure: POLYPECTOMY;  Surgeon: Malissa Hippo, MD;  [facility];  Service: Endoscopy;;  colon   PROSTATE BIOPSY N/A 03/31/2014   Procedure: BIOPSY TRANSRECTAL ULTRASONIC PROSTATE (TUBP);  Surgeon: Marcine Matar, MD;  Location: [facility];  Service:  Urology;  Laterality: N/A;   PROSTATE BIOPSY N/A 02/15/2021   Procedure: BIOPSY TRANSRECTAL ULTRASONIC PROSTATE (TUBP);  Surgeon: Marcine Matar, MD;  Location: [facility];  Service: Urology;  Laterality: N/A;  45 MINS   TRANSURETHRAL RESECTION OF BLADDER TUMOR N/A 03/14/2013   Procedure: TRANSURETHRAL RESECTION OF BLADDER TUMOR (TURBT);  Surgeon: Marcine Matar, MD;  Location: [facility];  Service: Urology;  Laterality: N/A;  1 HR WITH MITOMYCIN  INSTILLATION    TRANSURETHRAL RESECTION OF BLADDER TUMOR N/A 09/30/2013   Procedure: TRANSURETHRAL RESECTION OF BLADDER TUMOR (TURBT);  Surgeon: Marcine Matar, MD;  Location: [facility];  Service: Urology;  Laterality: N/A;   TRANSURETHRAL RESECTION OF BLADDER TUMOR N/A 03/31/2014   Procedure: TRANSURETHRAL RESECTION OF BLADDER TUMOR (TURBT);  Surgeon: Marcine Matar, MD;  Location: [facility];  Service: Urology;  Laterality: N/A;   TRANSURETHRAL RESECTION OF BLADDER TUMOR N/A 05/31/2015   Procedure: Cystoscopy with clott evacuation and fulgeration of bleeders, retrograde pylegram;  Surgeon: Sebastian Ache, MD;  Location: [facility];  Service: Urology;  Laterality: N/A;    Home Medications:  Allergies as of 09/12/2023       Reactions   Lipitor [atorvastatin] Other (See Comments)   Myalgia   Other Other (See Comments)   All Cholesterol Meds - causes muscle and joint pain   Pravastatin Other (See Comments)   Myalgia        Medication List        Accurate as of September 11, 2023  9:21 AM. If you have any questions, ask your nurse or doctor.          acetaminophen 500 MG tablet Commonly known as: TYLENOL Take 500 mg by mouth every 6 (six) hours as needed (pain).   aspirin 81 MG chewable tablet Chew 1 tablet (81 mg total) by mouth daily.   carvedilol 12.5 MG tablet Commonly known as: COREG Take 1 tablet (12.5 mg total) by mouth in the morning and at bedtime.   cilostazol 100 MG tablet Commonly known as: PLETAL Take 1 tablet (100 mg total) by mouth 2 (two) times daily before a meal.   clopidogrel 75 MG tablet Commonly known as: Plavix Take 1 tablet (75 mg total) by mouth daily.   diclofenac Sodium 1 % Gel Commonly known as: VOLTAREN Apply 2 g topically daily as needed (pain).   empagliflozin 10 MG Tabs tablet Commonly known as: Jardiance Take 1 tablet (10 mg total) by mouth daily before breakfast.   glimepiride 2 MG tablet Commonly  known as: AMARYL Take 1 tablet (2 mg total) by mouth 2 (two) times daily.   losartan 25 MG tablet Commonly known as: COZAAR TAKE 1 TABLET (25 MG TOTAL) BY MOUTH DAILY.   Repatha SureClick 140 MG/ML Soaj Generic drug: Evolocumab Inject 140 mg into the skin every 14 (fourteen) days.   Tradjenta 5 MG Tabs tablet Generic drug: linagliptin Take 1 tablet (5 mg total) by mouth daily.        Allergies:  Allergies  Allergen Reactions   Lipitor [Atorvastatin] Other (See Comments)    Myalgia   Other Other (See Comments)    All Cholesterol Meds - causes muscle and joint pain   Pravastatin Other (See Comments)    Myalgia    Family History  Problem Relation Age of Onset   Cancer Child    Asthma Mother     Social History:  reports that he quit smoking about 30 years ago. His  smoking use included cigarettes. He started smoking about 60 years ago. He has a 30 pack-year smoking history. He has never used smokeless tobacco. He reports that he does not drink alcohol and does not use drugs.  ROS: A complete review of systems was performed.  All systems are negative except for pertinent findings as noted.  Physical Exam:  Vital signs in last 24 hours: There were no vitals taken for this visit. Constitutional:  Alert and oriented, No acute distress Cardiovascular: Regular rate  Respiratory: Normal respiratory effort GI: Abdomen is soft, nontender, nondistended, no abdominal masses. No CVAT.  Genitourinary: Normal male phallus, testes are descended bilaterally and non-tender and without masses, scrotum is normal in appearance without lesions or masses, perineum is normal on inspection. Lymphatic: No lymphadenopathy Neurologic: Grossly intact, no focal deficits Psychiatric: Normal mood and affect  I have reviewed prior pt notes  I have reviewed notes from referring/previous physicians  I have reviewed urinalysis results  I have independently reviewed prior imaging  I have reviewed  prior PSA results  I have reviewed prior urine culture   Impression/Assessment:  ***  Plan:  ***

## 2023-09-12 ENCOUNTER — Ambulatory Visit (INDEPENDENT_AMBULATORY_CARE_PROVIDER_SITE_OTHER): Payer: Medicare HMO | Admitting: Urology

## 2023-09-12 ENCOUNTER — Encounter: Payer: Self-pay | Admitting: Urology

## 2023-09-12 ENCOUNTER — Telehealth: Payer: Self-pay

## 2023-09-12 VITALS — BP 148/81 | HR 62

## 2023-09-12 DIAGNOSIS — C61 Malignant neoplasm of prostate: Secondary | ICD-10-CM | POA: Diagnosis not present

## 2023-09-12 DIAGNOSIS — Z8551 Personal history of malignant neoplasm of bladder: Secondary | ICD-10-CM | POA: Diagnosis not present

## 2023-09-12 DIAGNOSIS — R8289 Other abnormal findings on cytological and histological examination of urine: Secondary | ICD-10-CM | POA: Diagnosis not present

## 2023-09-12 DIAGNOSIS — N401 Enlarged prostate with lower urinary tract symptoms: Secondary | ICD-10-CM | POA: Diagnosis not present

## 2023-09-12 DIAGNOSIS — R3916 Straining to void: Secondary | ICD-10-CM | POA: Diagnosis not present

## 2023-09-12 LAB — URINALYSIS, ROUTINE W REFLEX MICROSCOPIC
Bilirubin, UA: NEGATIVE
Ketones, UA: NEGATIVE
Leukocytes,UA: NEGATIVE
Nitrite, UA: NEGATIVE
Protein,UA: NEGATIVE
RBC, UA: NEGATIVE
Specific Gravity, UA: 1.01 (ref 1.005–1.030)
Urobilinogen, Ur: 0.2 mg/dL (ref 0.2–1.0)
pH, UA: 6 (ref 5.0–7.5)

## 2023-09-12 MED ORDER — CIPROFLOXACIN HCL 500 MG PO TABS
500.0000 mg | ORAL_TABLET | Freq: Once | ORAL | Status: AC
Start: 2023-09-12 — End: 2023-09-12
  Administered 2023-09-12: 500 mg via ORAL

## 2023-09-12 NOTE — Telephone Encounter (Signed)
Correction. He needs an [employer] Patient assistance form for Repatha. Repatha is Managed by Dr Wyline Mood.

## 2023-09-12 NOTE — Telephone Encounter (Signed)
Spoke with a representative at [facility] Patient Geophysicist/field seismologist program. Posey Rea of why the process was delayed. The patient was approved for Jardiance & Tradjenta. These will be shipped to patients home.They do not handle any assisitance for Repatha. Called pt. No answer. Lft msg for him to call back. He will need to fill out a [employer]. I will put this in the mail for him to fill out his part and mail or bring back to office.

## 2023-09-13 LAB — PSA: Prostate Specific Ag, Serum: 3.6 ng/mL (ref 0.0–4.0)

## 2023-09-13 NOTE — Telephone Encounter (Signed)
Could not get pt on the phone. Mailed him an [employer] pt asst form for the Repatha with a note that he can fill out the patient section and return to Dr Verna Czech office if he had not done so already.

## 2023-09-14 ENCOUNTER — Telehealth: Payer: Self-pay

## 2023-09-14 LAB — CYTOLOGY, URINE

## 2023-09-14 NOTE — Telephone Encounter (Signed)
Patient made aware and voiced understanding.

## 2023-09-14 NOTE — Telephone Encounter (Signed)
-----   Message from Bertram Millard Dahlstedt sent at 09/14/2023 12:32 PM EDT ----- Please notify patient that PSA is down to 3.6, excellent.  Also, urine cytology did not show any cancer cells. ----- Message ----- From: Nell Range Lab Results In Sent: 09/12/2023   3:36 PM EDT To: Marcine Matar, MD

## 2023-09-26 ENCOUNTER — Ambulatory Visit: Payer: Self-pay | Admitting: *Deleted

## 2023-09-26 ENCOUNTER — Encounter: Payer: Self-pay | Admitting: *Deleted

## 2023-09-26 NOTE — Patient Outreach (Signed)
Care Coordination   Follow Up Visit Note   09/26/2023 Name: Timothy [name] MRN: [id number] DOB: [birth year]  Timothy [name] is a 77 y.o. year old male who sees Elfredia Nevins, MD for primary care. I spoke with  [name] by phone today.  What matters to the patients health and wellness today?  Obtaining prescription assistance    Goals Addressed               This Visit's Progress     Patient Stated     COMPLETED: Effective DM management (pt-stated)        Care Coordination Goals: Patient will keep appointment with endocrinologist on 10/09/23 Patient will take medication as prescribed and reach out to provider with any negative side effects Patient will work with endocrinology office regarding prescription assistance for Repatha  He has received approval for Jardiance and Tradjenta Per patient, he returned application for Repatha assistance through [employer] Patient will continue to monitor and record blood sugar daily and as needed with glucometer, and will call endocrinologist with any readings outside of recommended range Patient will take blood sugar log and meter to provider visits for review Patient will follow a modified carbohydrate diet and decrease simple carbohydrates and sugars.  Eat 3 meals per day with 30 grams of carbohydrates and up to 2 snacks per day, if needed, with less than 15 grams of carbohydrates.  Patient will increase activity level as tolerated with an ultimate goal of at least 150 minutes of exercise per week Patient will check feet daily for sores, wounds, calluses, etc and will notify provider of any abnormal findings Patient will have yearly eye exams to check for or monitor diabetic retinopathy Patient will reach out to RN Care Coordinator [phone] with any care coordination or resource needs  Patient does not have any acute or urgent needs related to this goal and will follow-up with PCP regarding management.       Other      COMPLETED: Manage Blood Pressure        Care Coordination Goals: Patient will take medications as directed and report any negative side effects to provider  Patient will use a pill box/organizer to help keep up with when to take medications Patient will monitor and record blood pressure daily and as needed and will call PCP or specialist with any readings outside of recommended range Patient will keep all recommended follow-up appointments with PCP and specialists (cardiology, nephrology, etc) Patient will take blood pressure log to PCP and specialty appointments for review Patient will follow a low sodium/DASH diet  Patient will reach out to RN Care Coordinator [phone] with any care coordination or resource needs   Patient does not have any acute or urgent needs related to this goal and will follow-up with PCP regarding management.        SDOH assessments and interventions completed:  Yes SDOH Interventions Today    Flowsheet Row Most Recent Value  SDOH Interventions   Transportation Interventions Intervention Not Indicated  Financial Strain Interventions Intervention Not Indicated      Care Coordination Interventions:  Yes, provided  Interventions Today    Flowsheet Row Most Recent Value  Chronic Disease   Chronic disease during today's visit Diabetes  General Interventions   General Interventions Discussed/Reviewed General Interventions Discussed, General Interventions Reviewed, Doctor Visits, Communication with  Doctor Visits Discussed/Reviewed Doctor Visits Discussed, Doctor Visits Reviewed, Specialist, PCP, Annual Wellness Visits  PCP/Specialist Visits Compliance with follow-up visit  [Follow-up with  PCP for routine care and for care management services. Ronny Bacon, NP (endocrinologist) on 10/09/23]  Communication with PCP/Specialists  [Staff Messaging with [facility] Re: prescription assistance for jardiance and Tradjenta. This was  approved. They mailed form to patient for [employer] for assistance with Repatha.]  Education Interventions   Education Provided Provided Education  Provided Verbal Education On Medication, When to see the doctor  Pharmacy Interventions   Pharmacy Dicussed/Reviewed Pharmacy Topics Discussed, Pharmacy Topics Reviewed, Medications and their functions, Affording Medications  [Follow-up with [facility] Re: precription assistance for Repatha or with Maxwell Caul, PharmD with [facility].]       Follow up plan: No further intervention required. Patient's Primary Care office is not partnering with the [facility] for care management and will be providing Care Management Services themselves. This final Care Management note will be securely faxed to the PCP office for handoff. Patient has been encouraged to reach out to their PCP office with any resource or care management needs.   Encounter Outcome:  Patient Visit Completed   Demetrios Loll, RN, BSN Care Management Coordinator [facility]  [facility] Direct Dial: [phone] Main #: [phone]

## 2023-10-09 ENCOUNTER — Ambulatory Visit: Payer: Medicare HMO | Admitting: Nurse Practitioner

## 2023-10-09 ENCOUNTER — Encounter: Payer: Self-pay | Admitting: Nurse Practitioner

## 2023-10-09 VITALS — BP 130/74 | HR 55 | Ht 68.0 in | Wt 213.0 lb

## 2023-10-09 DIAGNOSIS — Z7984 Long term (current) use of oral hypoglycemic drugs: Secondary | ICD-10-CM | POA: Diagnosis not present

## 2023-10-09 DIAGNOSIS — E1122 Type 2 diabetes mellitus with diabetic chronic kidney disease: Secondary | ICD-10-CM

## 2023-10-09 DIAGNOSIS — E782 Mixed hyperlipidemia: Secondary | ICD-10-CM | POA: Diagnosis not present

## 2023-10-09 DIAGNOSIS — N1831 Chronic kidney disease, stage 3a: Secondary | ICD-10-CM

## 2023-10-09 DIAGNOSIS — I10 Essential (primary) hypertension: Secondary | ICD-10-CM

## 2023-10-09 LAB — POCT UA - MICROALBUMIN
Creatinine, POC: 300 mg/dL
Microalbumin Ur, POC: 80 mg/L

## 2023-10-09 LAB — POCT GLYCOSYLATED HEMOGLOBIN (HGB A1C): Hemoglobin A1C: 7.3 % — AB (ref 4.0–5.6)

## 2023-10-09 NOTE — Progress Notes (Signed)
Endocrinology Follow Up Note       10/09/2023, 12:10 PM   Subjective:    Patient ID: Timothy [name], male    DOB: 01/07/1946.  Timothy [name] is being seen in follow up after being seen in consultation for management of currently uncontrolled symptomatic diabetes requested by  Elfredia Nevins, MD.   Past Medical History:  Diagnosis Date   Arthritis    Bladder cancer (HCC)    Blood clot in vein 2018   left   CAD (coronary artery disease)    a. 06/2014: NSTEMI s/p PTCA of LCx, 60-80% residual in distal LAD, 70% prox small D1.   b. 02/06/15 NSTEMI  s/p successful PCI/DES to mid RCA c. angioplasty alone to LCx in 11/2016   Decreased GFR 45 % per 01-04-2021 labs   Diabetes mellitus, type 2 (HCC)    Elevated PSA    HLD (hyperlipidemia)    HOH (hard of hearing)    Hypertension    NSTEMI (non-ST elevated myocardial infarction) (HCC) 2015   Peripheral vascular disease (HCC)    Poor historian    Wears hearing aid in right ear     Past Surgical History:  Procedure Laterality Date   ABDOMINAL AORTOGRAM W/LOWER EXTREMITY N/A 08/28/2018   Procedure: ABDOMINAL AORTOGRAM W/LOWER EXTREMITY;  Surgeon: Nada Libman, MD;  [facility]: [facility];  Service: Cardiovascular;  Laterality: N/A;  bilateral   ABDOMINAL AORTOGRAM W/LOWER EXTREMITY N/A 07/09/2019   Procedure: ABDOMINAL AORTOGRAM W/LOWER EXTREMITY;  Surgeon: Nada Libman, MD;  Location: [facility];  Service: Cardiovascular;  Laterality: N/A;  unilateral Lt   ABDOMINAL AORTOGRAM W/LOWER EXTREMITY N/A 06/13/2023   Procedure: ABDOMINAL AORTOGRAM W/LOWER EXTREMITY;  Surgeon: Nada Libman, MD;  Location: [facility];  Service: Cardiovascular;  Laterality: N/A;   CARDIAC CATHETERIZATION  02/06/2015   Procedure: CORONARY STENT INTERVENTION;  Surgeon: Marykay Lex, MD;  Location: [facility] CATH LAB;  Service: Cardiovascular;;  Mid RCA   CARDIAC  CATHETERIZATION N/A 11/11/2016   Procedure: Left Heart Cath and Coronary Angiography;  Surgeon: Corky Crafts, MD;  Location: [facility] INVASIVE CV LAB;  Service: Cardiovascular;  Laterality: N/A;   CARDIAC CATHETERIZATION N/A 11/11/2016   Procedure: Coronary Balloon Angioplasty;  Surgeon: Corky Crafts, MD;  Location: [facility] INVASIVE CV LAB;  Service: Cardiovascular;  Laterality: N/A;   COLONOSCOPY N/A 01/14/2019   Procedure: COLONOSCOPY;  Surgeon: Malissa Hippo, MD;  Location: [facility];  Service: Endoscopy;  Laterality: N/A;  730   CORONARY ANGIOPLASTY  11/11/2016   Mid Cx to Dist Cx lesion, 100 %stenosed. This lesion was treated with balloon angioplasty with a 2.0 balloon   CYSTOSCOPY W/ RETROGRADES N/A 05/05/2015   Procedure: CYSTOSCOPY WITH BILATERAL RETROGRADE PYELOGRAMS AND BLADDER BIOPSIES;  Surgeon: Marcine Matar, MD;  Location: [facility];  Service: Urology;  Laterality: N/A;   CYSTOSCOPY W/ RETROGRADES Bilateral 07/31/2018   Procedure: CYSTOSCOPY WITH BILATERAL RETROGRADE PYELOGRAM;  Surgeon: Marcine Matar, MD;  Location: [facility];  Service: Urology;  Laterality: Bilateral;   CYSTOSCOPY W/ RETROGRADES Right 02/15/2021   Procedure: CYSTOSCOPY WITH RETROGRADE PYELOGRAM;  Surgeon: Marcine Matar, MD;  Location: [facility];  Service: Urology;  Laterality: Right;   CYSTOSCOPY WITH BIOPSY N/A 07/31/2018   Procedure: CYSTOSCOPY WITH BIOPSY;  Surgeon: Marcine Matar, MD;  Location: [facility];  Service: Urology;  Laterality: N/A;   CYSTOSCOPY WITH BIOPSY N/A 02/15/2021   Procedure: CYSTOSCOPY WITH BIOPSY;  Surgeon: Marcine Matar, MD;  Location: [facility];  Service: Urology;  Laterality: N/A;   LEFT HEART CATHETERIZATION WITH CORONARY ANGIOGRAM N/A 06/17/2014   Procedure: LEFT HEART CATHETERIZATION WITH CORONARY ANGIOGRAM;  Surgeon: Marykay Lex, MD;  Location: [facility] CATH LAB;  Service: Cardiovascular;  Laterality: N/A;   LEFT HEART CATHETERIZATION  WITH CORONARY ANGIOGRAM N/A 02/06/2015   Procedure: LEFT HEART CATHETERIZATION WITH CORONARY ANGIOGRAM;  Surgeon: Marykay Lex, MD;  Location: [facility] CATH LAB;  Service: Cardiovascular;  Laterality: N/A;   PERIPHERAL VASCULAR ATHERECTOMY  08/28/2018   Procedure: PERIPHERAL VASCULAR ATHERECTOMY;  Surgeon: Nada Libman, MD;  [facility]: [facility];  Service: Cardiovascular;;  Prox lt.SFA, Distal SFA   PERIPHERAL VASCULAR INTERVENTION  07/09/2019   Procedure: PERIPHERAL VASCULAR INTERVENTION;  Surgeon: Nada Libman, MD;  Location: [facility];  Service: Cardiovascular;;  Lt. SFA   PERIPHERAL VASCULAR INTERVENTION  06/13/2023   Procedure: PERIPHERAL VASCULAR INTERVENTION;  Surgeon: Nada Libman, MD;  Location: [facility];  Service: Cardiovascular;;  Lt SFA   POLYPECTOMY  01/14/2019   Procedure: POLYPECTOMY;  Surgeon: Malissa Hippo, MD;  [facility]: [facility];  Service: Endoscopy;;  colon   PROSTATE BIOPSY N/A 03/31/2014   Procedure: BIOPSY TRANSRECTAL ULTRASONIC PROSTATE (TUBP);  Surgeon: Marcine Matar, MD;  Location: [facility];  Service: Urology;  Laterality: N/A;   PROSTATE BIOPSY N/A 02/15/2021   Procedure: BIOPSY TRANSRECTAL ULTRASONIC PROSTATE (TUBP);  Surgeon: Marcine Matar, MD;  Location: [facility];  Service: Urology;  Laterality: N/A;  45 MINS   TRANSURETHRAL RESECTION OF BLADDER TUMOR N/A 03/14/2013   Procedure: TRANSURETHRAL RESECTION OF BLADDER TUMOR (TURBT);  Surgeon: Marcine Matar, MD;  Location: [facility];  Service: Urology;  Laterality: N/A;  1 HR WITH MITOMYCIN INSTILLATION    TRANSURETHRAL RESECTION OF BLADDER TUMOR N/A 09/30/2013   Procedure: TRANSURETHRAL RESECTION OF BLADDER TUMOR (TURBT);  Surgeon: Marcine Matar, MD;  Location: [facility];  Service: Urology;  Laterality: N/A;   TRANSURETHRAL RESECTION OF BLADDER TUMOR N/A 03/31/2014   Procedure: TRANSURETHRAL RESECTION OF  BLADDER TUMOR (TURBT);  Surgeon: Marcine Matar, MD;  Location: [facility];  Service: Urology;  Laterality: N/A;   TRANSURETHRAL RESECTION OF BLADDER TUMOR N/A 05/31/2015   Procedure: Cystoscopy with clott evacuation and fulgeration of bleeders, retrograde pylegram;  Surgeon: Sebastian Ache, MD;  Location: [facility];  Service: Urology;  Laterality: N/A;    Social History   Socioeconomic History   Marital status: Married    Spouse name: Not on file   Number of children: Not on file   Years of education: Not on file   Highest education level: Not on file  Occupational History   Not on file  Tobacco Use   Smoking status: Former    Current packs/day: 0.00    Average packs/day: 1 pack/day for 30.0 years (30.0 ttl pk-yrs)    Types: Cigarettes    Start date: 03/12/1963    Quit date: 03/11/1993    Years since quitting: 30.6   Smokeless tobacco: Never  Vaping Use   Vaping status: Never Used  Substance and Sexual Activity   Alcohol use: No  Alcohol/week: 0.0 standard drinks of alcohol   Drug use: No   Sexual activity: Not Currently  Other Topics Concern   Not on file  Social History Narrative   Not on file   Social Determinants of Health   Financial Resource Strain: Low Risk  (09/26/2023)   Overall Financial Resource Strain (CARDIA)    Difficulty of Paying Living Expenses: Not very hard  Food Insecurity: No Food Insecurity (09/11/2023)   Hunger Vital Sign    Worried About Running Out of Food in the Last Year: Never true    Ran Out of Food in the Last Year: Never true  Transportation Needs: No Transportation Needs (09/26/2023)   PRAPARE - Administrator, Civil Service (Medical): No    Lack of Transportation (Non-Medical): No  Physical Activity: Sufficiently Active (09/11/2023)   Exercise Vital Sign    Days of Exercise per Week: 5 days    Minutes of Exercise per Session: 30 min  Stress: Not on file  Social Connections: Not on file    Family History   Problem Relation Age of Onset   Cancer Child    Asthma Mother     Outpatient Encounter Medications as of 10/09/2023  Medication Sig   aspirin 81 MG chewable tablet Chew 1 tablet (81 mg total) by mouth daily.   carvedilol (COREG) 12.5 MG tablet Take 1 tablet (12.5 mg total) by mouth in the morning and at bedtime.   cilostazol (PLETAL) 100 MG tablet Take 1 tablet (100 mg total) by mouth 2 (two) times daily before a meal.   clopidogrel (PLAVIX) 75 MG tablet Take 1 tablet (75 mg total) by mouth daily.   empagliflozin (JARDIANCE) 10 MG TABS tablet Take 1 tablet (10 mg total) by mouth daily before breakfast.   Evolocumab (REPATHA SURECLICK) 140 MG/ML SOAJ Inject 140 mg into the skin every 14 (fourteen) days.   glimepiride (AMARYL) 2 MG tablet Take 1 tablet (2 mg total) by mouth 2 (two) times daily.   losartan (COZAAR) 25 MG tablet TAKE 1 TABLET (25 MG TOTAL) BY MOUTH DAILY.   TRADJENTA 5 MG TABS tablet Take 1 tablet (5 mg total) by mouth daily.   acetaminophen (TYLENOL) 500 MG tablet Take 500 mg by mouth every 6 (six) hours as needed (pain).   diclofenac Sodium (VOLTAREN) 1 % GEL Apply 2 g topically daily as needed (pain).   No facility-administered encounter medications on file as of 10/09/2023.    ALLERGIES: Allergies  Allergen Reactions   Lipitor [Atorvastatin] Other (See Comments)    Myalgia   Other Other (See Comments)    All Cholesterol Meds - causes muscle and joint pain   Pravastatin Other (See Comments)    Myalgia    VACCINATION STATUS: Immunization History  Administered Date(s) Administered   Influenza, High Dose Seasonal PF 08/29/2017, 08/16/2018   Influenza-Unspecified 09/08/2014    Diabetes He presents for his follow-up diabetic visit. He has type 2 diabetes mellitus. Onset time: diagnosed at approx age of 53. His disease course has been improving. There are no hypoglycemic associated symptoms. Associated symptoms include polyuria (on Jardiance). There are no  hypoglycemic complications. Diabetic complications include nephropathy. Risk factors for coronary artery disease include diabetes mellitus, dyslipidemia, family history, male sex, obesity, hypertension and sedentary lifestyle. Current diabetic treatment includes oral agent (triple therapy). He is compliant with treatment most of the time. His weight is fluctuating minimally. He is following a generally unhealthy diet. When asked about meal planning, he reported  none. He has not had a previous visit with a dietitian. He rarely participates in exercise. His home blood glucose trend is decreasing steadily. His overall blood glucose range is 130-140 mg/dl. (He presents today with his meter showing stable, at target glycemic profile overall.  His POCT A1c today is 7.3%, improving from last visit of 8%.  Analysis of his meter shows 7-day average of 133, 14-day average of 132, 30-day average of 135.  He denies any hypoglycemia.  He notes he does not think his recent patient assistance package came with both the [place] and the Jardiance.  He thinks he is nearly out of his Jardiance.) An ACE inhibitor/angiotensin II receptor blocker is not being taken. He sees a podiatrist.Eye exam is current.     Review of systems  Constitutional: + Minimally fluctuating body weight,  current Body mass index is 32.39 kg/m. , no fatigue, no subjective hyperthermia, no subjective hypothermia Eyes: no blurry vision, no xerophthalmia ENT: no sore throat, no nodules palpated in throat, no dysphagia/odynophagia, no hoarseness Cardiovascular: no chest pain, no shortness of breath, no palpitations, no leg swelling Respiratory: no cough, no shortness of breath Gastrointestinal: no nausea/vomiting/diarrhea Musculoskeletal: no muscle/joint aches Skin: no rashes, no hyperemia Neurological: no tremors, + numbness/ tingling to BLE- worse at night and in AM, no dizziness Psychiatric: no depression, no anxiety  Objective:     BP  130/74   Pulse (!) 55   Ht 5\' 8"  (1.727 m)   Wt 213 lb (96.6 kg)   BMI 32.39 kg/m   Wt Readings from Last 3 Encounters:  10/09/23 213 lb (96.6 kg)  08/15/23 213 lb 9.6 oz (96.9 kg)  07/24/23 210 lb (95.3 kg)     BP Readings from Last 3 Encounters:  10/09/23 130/74  09/12/23 (!) 148/81  08/15/23 136/72     Physical Exam- Limited  Constitutional:  Body mass index is 32.39 kg/m. , not in acute distress, normal state of mind, HOH wears bilateral hearing aids Eyes:  EOMI, no exophthalmos Musculoskeletal: no gross deformities, strength intact in all four extremities, no gross restriction of joint movements Skin:  no rashes, no hyperemia Neurological: no tremor with outstretched hands   Diabetic Foot Exam - Simple   Simple Foot Form Diabetic Foot exam was performed with the following findings: Yes 10/09/2023 11:03 AM  Visual Inspection No deformities, no ulcerations, no other skin breakdown bilaterally: Yes Sensation Testing Intact to touch and monofilament testing bilaterally: Yes Pulse Check Posterior Tibialis and Dorsalis pulse intact bilaterally: Yes Comments     CMP ( most recent) CMP     Component Value Date/Time   NA 140 06/13/2023 0859   NA 143 10/21/2022 1001   K 4.4 06/13/2023 0859   CL 107 06/13/2023 0859   CO2 20 10/21/2022 1001   GLUCOSE 201 (H) 06/13/2023 0859   BUN 26 (H) 06/13/2023 0859   BUN 17 10/21/2022 1001   CREATININE 1.40 (H) 06/13/2023 0859   CREATININE 1.43 (H) 02/25/2020 1148   CALCIUM 9.6 10/21/2022 1001   PROT 7.2 11/10/2016 2351   ALBUMIN 4.2 11/10/2016 2351   AST 32 11/10/2016 2351   ALT 20 11/10/2016 2351   ALKPHOS 47 11/10/2016 2351   BILITOT 0.5 11/10/2016 2351   GFRNONAA 45 (L) 01/04/2021 0953   GFRAA 47 (L) 07/09/2019 0801     Diabetic Labs (most recent): Lab Results  Component Value Date   HGBA1C 7.3 (A) 10/09/2023   HGBA1C 8.0 (A) 07/04/2023   HGBA1C 8.1 (  A) 03/09/2023   MICROALBUR 80 10/09/2023   MICROALBUR 30  mg'l 10/10/2022     Lipid Panel ( most recent) Lipid Panel     Component Value Date/Time   CHOL 138 11/12/2016 0101   TRIG 372 (A) 08/23/2022 0000   HDL 25 (L) 11/12/2016 0101   CHOLHDL 5.5 11/12/2016 0101   VLDL 39 11/12/2016 0101   LDLCALC 115 08/23/2022 0000      Lab Results  Component Value Date   TSH 1.17 08/23/2022   TSH 1.805 02/06/2015           Assessment & Plan:   1) Type 2 diabetes mellitus with stage 3a chronic kidney disease, without long-term current use of insulin (HCC)  He presents today with his meter showing stable, at target glycemic profile overall.  His POCT A1c today is 7.3%, improving from last visit of 8%.  Analysis of his meter shows 7-day average of 133, 14-day average of 132, 30-day average of 135.  He denies any hypoglycemia.  He notes he does not think his recent patient assistance package came with both the [place] and the Jardiance.  He thinks he is nearly out of his Jardiance.  - Timothy [name] has currently uncontrolled symptomatic type 2 DM since 77 years of age.   -Recent labs reviewed.  His POCT UM shows mild microalbuminuria consistent with his stage 3a kidney disease.  - I had a long discussion with him about the progressive nature of diabetes and the pathology behind its complications. -his diabetes is complicated by mild CKD and he remains at a high risk for more acute and chronic complications which include CAD, CVA, CKD, retinopathy, and neuropathy. These are all discussed in detail with him.  The following Lifestyle Medicine recommendations according to American College of Lifestyle Medicine [facility]) were discussed and offered to patient and he agrees to start the journey:  A. Whole Foods, Plant-based plate comprising of fruits and vegetables, plant-based proteins, whole-grain carbohydrates was discussed in detail with the patient.   A list for source of those nutrients were also provided to the patient.  Patient will use only  water or unsweetened tea for hydration. B.  The need to stay away from risky substances including alcohol, smoking; obtaining 7 to 9 hours of restorative sleep, at least 150 minutes of moderate intensity exercise weekly, the importance of healthy social connections,  and stress reduction techniques were discussed. C.  A full color page of  Calorie density of various food groups per pound showing examples of each food groups was provided to the patient.  - Nutritional counseling repeated at each appointment due to patients tendency to fall back in to old habits.  - The patient admits there is a room for improvement in their diet and drink choices. -  Suggestion is made for the patient to avoid simple carbohydrates from their diet including Cakes, Sweet Desserts / Pastries, Ice Cream, Soda (diet and regular), Sweet Tea, Candies, Chips, Cookies, Sweet Pastries, Store Bought Juices, Alcohol in Excess of 1-2 drinks a day, Artificial Sweeteners, Coffee Creamer, and "Sugar-free" Products. This will help patient to have stable blood glucose profile and potentially avoid unintended weight gain.   - I encouraged the patient to switch to unprocessed or minimally processed complex starch and increased protein intake (animal or plant source), fruits, and vegetables.   - Patient is advised to stick to a routine mealtimes to eat 3 meals a day and avoid unnecessary snacks (to snack only to correct  hypoglycemia).  - I have approached him with the following individualized plan to manage his diabetes and patient agrees:   - he is advised to continue Glimepiride 2 mg po twice daily with meals, therapeutically suitable for patient (not likely causing diarrhea), Tradjenta 5 mg po daily, and Jardiance 10 mg po daily.  I will have my nurse reach out to the PAP to see if both the Tradjenta and Jardiance were sent to him.  -he is encouraged to start monitoring glucose twice daily, before breakfast and before bed, and to call  the clinic if he has readings less than 70 or above 300 for 3 tests in a row.  - Adjustment parameters are given to him for hypo and hyperglycemia in writing. - he is encouraged to call clinic for blood glucose levels less than 70 or above 300 mg /dl.  - Specific targets for  A1c; LDL, HDL, and Triglycerides were discussed with the patient.  2) Blood Pressure /Hypertension:  his blood pressure is controlled to target.   he is advised to continue his current medications including Hydralazine 75 mg p.o. TID and Metoprolol 25 mg po daily.  3) Lipids/Hyperlipidemia:    Review of his recent lipid panel from 08/24/23 showed controlled LDL at 40 and elevated triglycerides of 273 (improving).  He is statin intolerant says his PCP was going to start him on Repatha but has not heard about it yet.  He was denied patient assistance for Repatha.  4)  Weight/Diet:  his Body mass index is 32.39 kg/m.  -  clearly complicating his diabetes care.   he is a candidate for weight loss. I discussed with him the fact that loss of 5 - 10% of his  current body weight will have the most impact on his diabetes management.  Exercise, and detailed carbohydrates information provided  -  detailed on discharge instructions.  5) Chronic Care/Health Maintenance: -he is not on ACEI/ARB and is intolerant to Statin medications and is encouraged to initiate and continue to follow up with Ophthalmology, Dentist, Podiatrist at least yearly or according to recommendations, and advised to stay away from smoking. I have recommended yearly flu vaccine and pneumonia vaccine at least every 5 years; moderate intensity exercise for up to 150 minutes weekly; and sleep for at least 7 hours a day.  - he is advised to maintain close follow up with Elfredia Nevins, MD for primary care needs, as well as his other providers for optimal and coordinated care.      I spent  41  minutes in the care of the patient today including review of labs from  CMP, Lipids, Thyroid Function, Hematology (current and previous including abstractions from other facilities); face-to-face time discussing  his blood glucose readings/logs, discussing hypoglycemia and hyperglycemia episodes and symptoms, medications doses, his options of short and long term treatment based on the latest standards of care / guidelines;  discussion about incorporating lifestyle medicine;  and documenting the encounter. Risk reduction counseling performed per USPSTF guidelines to reduce obesity and cardiovascular risk factors.     Please refer to Patient Instructions for Blood Glucose Monitoring and Insulin/Medications Dosing Guide"  in media tab for additional information. Please  also refer to " Patient Self Inventory" in the Media  tab for reviewed elements of pertinent patient history.  [name] participated in the discussions, expressed understanding, and voiced agreement with the above plans.  All questions were answered to his satisfaction. he is encouraged to contact clinic should  he have any questions or concerns prior to his return visit.     Follow up plan: - Return in about 3 months (around 01/09/2024) for Diabetes F/U with A1c in office, No previsit labs, Bring meter and logs.  Ronny Bacon, [facility] [address] [place], [place] [address] Phone: [phone] Fax: [phone]  10/09/2023, 12:10 PM

## 2023-10-12 ENCOUNTER — Other Ambulatory Visit: Payer: Self-pay

## 2023-10-12 MED ORDER — EMPAGLIFLOZIN 10 MG PO TABS: 10.0000 mg | ORAL_TABLET | Freq: Every day | ORAL | 3 refills | Status: DC

## 2023-10-12 MED ORDER — TRADJENTA 5 MG PO TABS: 5.0000 mg | ORAL_TABLET | Freq: Every day | ORAL | 3 refills | Status: DC

## 2023-11-22 DIAGNOSIS — E1151 Type 2 diabetes mellitus with diabetic peripheral angiopathy without gangrene: Secondary | ICD-10-CM | POA: Diagnosis not present

## 2023-11-22 DIAGNOSIS — E6609 Other obesity due to excess calories: Secondary | ICD-10-CM | POA: Diagnosis not present

## 2023-11-22 DIAGNOSIS — E1129 Type 2 diabetes mellitus with other diabetic kidney complication: Secondary | ICD-10-CM | POA: Diagnosis not present

## 2023-11-22 DIAGNOSIS — R197 Diarrhea, unspecified: Secondary | ICD-10-CM | POA: Diagnosis not present

## 2023-11-22 DIAGNOSIS — I10 Essential (primary) hypertension: Secondary | ICD-10-CM | POA: Diagnosis not present

## 2023-11-22 DIAGNOSIS — Z6833 Body mass index (BMI) 33.0-33.9, adult: Secondary | ICD-10-CM | POA: Diagnosis not present

## 2023-11-22 DIAGNOSIS — E1165 Type 2 diabetes mellitus with hyperglycemia: Secondary | ICD-10-CM | POA: Diagnosis not present

## 2023-11-22 DIAGNOSIS — E1140 Type 2 diabetes mellitus with diabetic neuropathy, unspecified: Secondary | ICD-10-CM | POA: Diagnosis not present

## 2023-11-22 DIAGNOSIS — K219 Gastro-esophageal reflux disease without esophagitis: Secondary | ICD-10-CM | POA: Diagnosis not present

## 2023-12-06 ENCOUNTER — Other Ambulatory Visit: Payer: Self-pay | Admitting: Cardiology

## 2023-12-07 ENCOUNTER — Other Ambulatory Visit: Payer: Self-pay | Admitting: Cardiology

## 2023-12-07 DIAGNOSIS — I214 Non-ST elevation (NSTEMI) myocardial infarction: Secondary | ICD-10-CM

## 2023-12-07 DIAGNOSIS — E7800 Pure hypercholesterolemia, unspecified: Secondary | ICD-10-CM

## 2023-12-08 ENCOUNTER — Encounter (INDEPENDENT_AMBULATORY_CARE_PROVIDER_SITE_OTHER): Payer: Self-pay | Admitting: *Deleted

## 2024-01-16 ENCOUNTER — Other Ambulatory Visit: Payer: Self-pay

## 2024-01-16 ENCOUNTER — Ambulatory Visit (INDEPENDENT_AMBULATORY_CARE_PROVIDER_SITE_OTHER): Payer: Medicare HMO | Admitting: Nurse Practitioner

## 2024-01-16 ENCOUNTER — Encounter: Payer: Self-pay | Admitting: Nurse Practitioner

## 2024-01-16 VITALS — BP 122/74 | HR 78 | Ht 68.0 in | Wt 215.0 lb

## 2024-01-16 DIAGNOSIS — E1122 Type 2 diabetes mellitus with diabetic chronic kidney disease: Secondary | ICD-10-CM | POA: Diagnosis not present

## 2024-01-16 DIAGNOSIS — Z7984 Long term (current) use of oral hypoglycemic drugs: Secondary | ICD-10-CM | POA: Diagnosis not present

## 2024-01-16 DIAGNOSIS — I10 Essential (primary) hypertension: Secondary | ICD-10-CM | POA: Diagnosis not present

## 2024-01-16 DIAGNOSIS — E782 Mixed hyperlipidemia: Secondary | ICD-10-CM

## 2024-01-16 DIAGNOSIS — N1831 Chronic kidney disease, stage 3a: Secondary | ICD-10-CM | POA: Diagnosis not present

## 2024-01-16 LAB — POCT GLYCOSYLATED HEMOGLOBIN (HGB A1C): Hemoglobin A1C: 7.8 % — AB (ref 4.0–5.6)

## 2024-01-16 MED ORDER — EMPAGLIFLOZIN 10 MG PO TABS: 10.0000 mg | ORAL_TABLET | Freq: Every day | ORAL | 3 refills | Status: DC

## 2024-01-16 MED ORDER — TRADJENTA 5 MG PO TABS: 5.0000 mg | ORAL_TABLET | Freq: Every day | ORAL | 3 refills | Status: DC

## 2024-01-16 MED ORDER — GLIMEPIRIDE 2 MG PO TABS: 2.0000 mg | ORAL_TABLET | Freq: Two times a day (BID) | ORAL | 3 refills | Status: DC

## 2024-01-16 NOTE — Progress Notes (Signed)
Endocrinology Follow Up Note       01/16/2024, 11:50 AM   Subjective:    Patient ID: Timothy [name], male    DOB: 10/26/1946.  Timothy [name] is being seen in follow up after being seen in consultation for management of currently uncontrolled symptomatic diabetes requested by  Elfredia Nevins, MD.   Past Medical History:  Diagnosis Date   Arthritis    Bladder cancer (HCC)    Blood clot in vein 2018   left   CAD (coronary artery disease)    a. 06/2014: NSTEMI s/p PTCA of LCx, 60-80% residual in distal LAD, 70% prox small D1.   b. 02/06/15 NSTEMI  s/p successful PCI/DES to mid RCA c. angioplasty alone to LCx in 11/2016   Decreased GFR 45 % per 01-04-2021 labs   Diabetes mellitus, type 2 (HCC)    Elevated PSA    HLD (hyperlipidemia)    HOH (hard of hearing)    Hypertension    NSTEMI (non-ST elevated myocardial infarction) (HCC) 2015   Peripheral vascular disease (HCC)    Poor historian    Wears hearing aid in right ear     Past Surgical History:  Procedure Laterality Date   ABDOMINAL AORTOGRAM W/LOWER EXTREMITY N/A 08/28/2018   Procedure: ABDOMINAL AORTOGRAM W/LOWER EXTREMITY;  Surgeon: Nada Libman, MD;  [facility];  Service: Cardiovascular;  Laterality: N/A;  bilateral   ABDOMINAL AORTOGRAM W/LOWER EXTREMITY N/A 07/09/2019   Procedure: ABDOMINAL AORTOGRAM W/LOWER EXTREMITY;  Surgeon: Nada Libman, MD;  Location: [facility];  Service: Cardiovascular;  Laterality: N/A;  unilateral Lt   ABDOMINAL AORTOGRAM W/LOWER EXTREMITY N/A 06/13/2023   Procedure: ABDOMINAL AORTOGRAM W/LOWER EXTREMITY;  Surgeon: Nada Libman, MD;  Location: [facility];  Service: Cardiovascular;  Laterality: N/A;   CARDIAC CATHETERIZATION  02/06/2015   Procedure: CORONARY STENT INTERVENTION;  Surgeon: Marykay Lex, MD;  Location: [facility] CATH LAB;  Service: Cardiovascular;;  Mid RCA   CARDIAC  CATHETERIZATION N/A 11/11/2016   Procedure: Left Heart Cath and Coronary Angiography;  Surgeon: Corky Crafts, MD;  Location: [facility] INVASIVE CV LAB;  Service: Cardiovascular;  Laterality: N/A;   CARDIAC CATHETERIZATION N/A 11/11/2016   Procedure: Coronary Balloon Angioplasty;  Surgeon: Corky Crafts, MD;  Location: [facility] INVASIVE CV LAB;  Service: Cardiovascular;  Laterality: N/A;   COLONOSCOPY N/A 01/14/2019   Procedure: COLONOSCOPY;  Surgeon: Malissa Hippo, MD;  Location: [facility];  Service: Endoscopy;  Laterality: N/A;  730   CORONARY ANGIOPLASTY  11/11/2016   Mid Cx to Dist Cx lesion, 100 %stenosed. This lesion was treated with balloon angioplasty with a 2.0 balloon   CYSTOSCOPY W/ RETROGRADES N/A 05/05/2015   Procedure: CYSTOSCOPY WITH BILATERAL RETROGRADE PYELOGRAMS AND BLADDER BIOPSIES;  Surgeon: Marcine Matar, MD;  Location: [facility];  Service: Urology;  Laterality: N/A;   CYSTOSCOPY W/ RETROGRADES Bilateral 07/31/2018   Procedure: CYSTOSCOPY WITH BILATERAL RETROGRADE PYELOGRAM;  Surgeon: Marcine Matar, MD;  Location: [facility];  Service: Urology;  Laterality: Bilateral;   CYSTOSCOPY W/ RETROGRADES Right 02/15/2021   Procedure: CYSTOSCOPY WITH RETROGRADE PYELOGRAM;  Surgeon: Marcine Matar, MD;  Location: [facility];  Service: Urology;  Laterality: Right;   CYSTOSCOPY WITH BIOPSY N/A 07/31/2018   Procedure: CYSTOSCOPY WITH BIOPSY;  Surgeon: Marcine Matar, MD;  Location: [facility];  Service: Urology;  Laterality: N/A;   CYSTOSCOPY WITH BIOPSY N/A 02/15/2021   Procedure: CYSTOSCOPY WITH BIOPSY;  Surgeon: Marcine Matar, MD;  Location: [facility];  Service: Urology;  Laterality: N/A;   LEFT HEART CATHETERIZATION WITH CORONARY ANGIOGRAM N/A 06/17/2014   Procedure: LEFT HEART CATHETERIZATION WITH CORONARY ANGIOGRAM;  Surgeon: Marykay Lex, MD;  Location: [facility] CATH LAB;  Service: Cardiovascular;  Laterality: N/A;   LEFT HEART CATHETERIZATION  WITH CORONARY ANGIOGRAM N/A 02/06/2015   Procedure: LEFT HEART CATHETERIZATION WITH CORONARY ANGIOGRAM;  Surgeon: Marykay Lex, MD;  Location: [facility] CATH LAB;  Service: Cardiovascular;  Laterality: N/A;   PERIPHERAL VASCULAR ATHERECTOMY  08/28/2018   Procedure: PERIPHERAL VASCULAR ATHERECTOMY;  Surgeon: Nada Libman, MD;  [facility];  Service: Cardiovascular;;  Prox lt.SFA, Distal SFA   PERIPHERAL VASCULAR INTERVENTION  07/09/2019   Procedure: PERIPHERAL VASCULAR INTERVENTION;  Surgeon: Nada Libman, MD;  [facility]: [facility];  Service: Cardiovascular;;  Lt. SFA   PERIPHERAL VASCULAR INTERVENTION  06/13/2023   Procedure: PERIPHERAL VASCULAR INTERVENTION;  Surgeon: Nada Libman, MD;  [facility]: [facility];  Service: Cardiovascular;;  Lt SFA   POLYPECTOMY  01/14/2019   Procedure: POLYPECTOMY;  Surgeon: Malissa Hippo, MD;  [facility];  Service: Endoscopy;;  colon   PROSTATE BIOPSY N/A 03/31/2014   Procedure: BIOPSY TRANSRECTAL ULTRASONIC PROSTATE (TUBP);  Surgeon: Marcine Matar, MD;  Location: [facility];  Service: Urology;  Laterality: N/A;   PROSTATE BIOPSY N/A 02/15/2021   Procedure: BIOPSY TRANSRECTAL ULTRASONIC PROSTATE (TUBP);  Surgeon: Marcine Matar, MD;  Location: [facility];  Service: Urology;  Laterality: N/A;  45 MINS   TRANSURETHRAL RESECTION OF BLADDER TUMOR N/A 03/14/2013   Procedure: TRANSURETHRAL RESECTION OF BLADDER TUMOR (TURBT);  Surgeon: Marcine Matar, MD;  Location: [facility];  Service: Urology;  Laterality: N/A;  1 HR WITH MITOMYCIN INSTILLATION    TRANSURETHRAL RESECTION OF BLADDER TUMOR N/A 09/30/2013   Procedure: TRANSURETHRAL RESECTION OF BLADDER TUMOR (TURBT);  Surgeon: Marcine Matar, MD;  Location: [facility];  Service: Urology;  Laterality: N/A;   TRANSURETHRAL RESECTION OF BLADDER TUMOR N/A 03/31/2014   Procedure: TRANSURETHRAL RESECTION OF  BLADDER TUMOR (TURBT);  Surgeon: Marcine Matar, MD;  Location: [facility];  Service: Urology;  Laterality: N/A;   TRANSURETHRAL RESECTION OF BLADDER TUMOR N/A 05/31/2015   Procedure: Cystoscopy with clott evacuation and fulgeration of bleeders, retrograde pylegram;  Surgeon: Sebastian Ache, MD;  Location: [facility];  Service: Urology;  Laterality: N/A;    Social History   Socioeconomic History   Marital status: Married    Spouse name: Not on file   Number of children: Not on file   Years of education: Not on file   Highest education level: Not on file  Occupational History   Not on file  Tobacco Use   Smoking status: Former    Current packs/day: 0.00    Average packs/day: 1 pack/day for 30.0 years (30.0 ttl pk-yrs)    Types: Cigarettes    Start date: 03/12/1963    Quit date: 03/11/1993    Years since quitting: 30.8   Smokeless tobacco: Never  Vaping Use   Vaping status: Never Used  Substance and Sexual Activity   Alcohol use: No  Alcohol/week: 0.0 standard drinks of alcohol   Drug use: No   Sexual activity: Not Currently  Other Topics Concern   Not on file  Social History Narrative   Not on file   Social Drivers of Health   Financial Resource Strain: Low Risk  (09/26/2023)   Overall Financial Resource Strain (CARDIA)    Difficulty of Paying Living Expenses: Not very hard  Food Insecurity: No Food Insecurity (09/11/2023)   Hunger Vital Sign    Worried About Running Out of Food in the Last Year: Never true    Ran Out of Food in the Last Year: Never true  Transportation Needs: No Transportation Needs (09/26/2023)   PRAPARE - Administrator, Civil Service (Medical): No    Lack of Transportation (Non-Medical): No  Physical Activity: Sufficiently Active (09/11/2023)   Exercise Vital Sign    Days of Exercise per Week: 5 days    Minutes of Exercise per Session: 30 min  Stress: Not on file  Social Connections: Not on file    Family History   Problem Relation Age of Onset   Cancer Child    Asthma Mother     Outpatient Encounter Medications as of 01/16/2024  Medication Sig   acetaminophen (TYLENOL) 500 MG tablet Take 500 mg by mouth every 6 (six) hours as needed (pain).   aspirin 81 MG chewable tablet Chew 1 tablet (81 mg total) by mouth daily.   carvedilol (COREG) 12.5 MG tablet TAKE 1 TABLET BY MOUTH TWICE A DAY   cilostazol (PLETAL) 100 MG tablet Take 1 tablet (100 mg total) by mouth 2 (two) times daily before a meal.   clopidogrel (PLAVIX) 75 MG tablet Take 1 tablet (75 mg total) by mouth daily.   diclofenac Sodium (VOLTAREN) 1 % GEL Apply 2 g topically daily as needed (pain).   empagliflozin (JARDIANCE) 10 MG TABS tablet Take 1 tablet (10 mg total) by mouth daily before breakfast.   Evolocumab (REPATHA SURECLICK) 140 MG/ML SOAJ INJECT 140 MG INTO THE SKIN EVERY 14 (FOURTEEN) DAYS.   losartan (COZAAR) 25 MG tablet TAKE 1 TABLET (25 MG TOTAL) BY MOUTH DAILY.   TRADJENTA 5 MG TABS tablet Take 1 tablet (5 mg total) by mouth daily.   [DISCONTINUED] glimepiride (AMARYL) 2 MG tablet Take 1 tablet (2 mg total) by mouth 2 (two) times daily.   glimepiride (AMARYL) 2 MG tablet Take 1 tablet (2 mg total) by mouth 2 (two) times daily.   No facility-administered encounter medications on file as of 01/16/2024.    ALLERGIES: Allergies  Allergen Reactions   Lipitor [Atorvastatin] Other (See Comments)    Myalgia   Other Other (See Comments)    All Cholesterol Meds - causes muscle and joint pain   Pravastatin Other (See Comments)    Myalgia    VACCINATION STATUS: Immunization History  Administered Date(s) Administered   Influenza, High Dose Seasonal PF 08/29/2017, 08/16/2018   Influenza-Unspecified 09/08/2014    Diabetes He presents for his follow-up diabetic visit. He has type 2 diabetes mellitus. Onset time: diagnosed at approx age of 30. His disease course has been stable. There are no hypoglycemic associated symptoms.  Associated symptoms include polyuria (on Jardiance). There are no hypoglycemic complications. Diabetic complications include nephropathy. Risk factors for coronary artery disease include diabetes mellitus, dyslipidemia, family history, male sex, obesity, hypertension and sedentary lifestyle. Current diabetic treatment includes oral agent (triple therapy). He is compliant with treatment most of the time. His weight is fluctuating minimally.  He is following a generally unhealthy diet. When asked about meal planning, he reported none. He has not had a previous visit with a dietitian. He rarely participates in exercise. His home blood glucose trend is fluctuating minimally. His overall blood glucose range is 140-180 mg/dl. (He presents today with his meter showing stable, at target glycemic profile overall.  His POCT A1c today is 7.8%,increasing slightly from last visit of 7.3%.  Analysis of his meter shows 7-day average of 154, 14-day average of 148, 30-day average of 143.  He denies any hypoglycemia.  [facility] notes [facility] Jardiance [facility], even though he gets this from [facility]AP.  He notes he gets "hot after he goes pee" and is wondering what that could be from.) An ACE inhibitor/angiotensin II receptor blocker is not being taken. He sees a podiatrist.Eye exam is current.     Review of systems  Constitutional: + Minimally fluctuating body weight,  current Body mass index is 32.69 kg/m. , no fatigue, no subjective hyperthermia, no subjective hypothermia Eyes: no blurry vision, no xerophthalmia ENT: no sore throat, no nodules palpated in throat, no dysphagia/odynophagia, no hoarseness Cardiovascular: no chest pain, no shortness of breath, no palpitations, no leg swelling Respiratory: no cough, no shortness of breath Gastrointestinal: no nausea/vomiting/diarrhea Musculoskeletal: no muscle/joint aches Skin: no rashes, no hyperemia Neurological: no tremors, + numbness/ tingling to BLE- worse at  night and in AM, no dizziness Psychiatric: no depression, no anxiety  Objective:     BP 122/74 (BP Location: Left Arm, Patient Position: Sitting, Cuff Size: Large)   Pulse 78   Ht 5\' 8"  (1.727 m)   Wt 215 lb (97.5 kg)   BMI 32.69 kg/m   Wt Readings from Last 3 Encounters:  01/16/24 215 lb (97.5 kg)  10/09/23 213 lb (96.6 kg)  08/15/23 213 lb 9.6 oz (96.9 kg)     BP Readings from Last 3 Encounters:  01/16/24 122/74  10/09/23 130/74  09/12/23 (!) 148/81     Physical Exam- Limited  Constitutional:  Body mass index is 32.69 kg/m. , not in acute distress, normal state of mind, HOH wears bilateral hearing aids Eyes:  EOMI, no exophthalmos Musculoskeletal: no gross deformities, strength intact in all four extremities, no gross restriction of joint movements Skin:  no rashes, no hyperemia Neurological: no tremor with outstretched hands   Diabetic Foot Exam - Simple   No data filed     CMP ( most recent) CMP     Component Value Date/Time   NA 140 06/13/2023 0859   NA 143 10/21/2022 1001   K 4.4 06/13/2023 0859   CL 107 06/13/2023 0859   CO2 20 10/21/2022 1001   GLUCOSE 201 (H) 06/13/2023 0859   BUN 26 (H) 06/13/2023 0859   BUN 17 10/21/2022 1001   CREATININE 1.40 (H) 06/13/2023 0859   CREATININE 1.43 (H) 02/25/2020 1148   CALCIUM 9.6 10/21/2022 1001   PROT 7.2 11/10/2016 2351   ALBUMIN 4.2 11/10/2016 2351   AST 32 11/10/2016 2351   ALT 20 11/10/2016 2351   ALKPHOS 47 11/10/2016 2351   BILITOT 0.5 11/10/2016 2351   GFRNONAA 45 (L) 01/04/2021 0953   GFRAA 47 (L) 07/09/2019 0801     Diabetic Labs (most recent): Lab Results  Component Value Date   HGBA1C 7.8 (A) 01/16/2024   HGBA1C 7.3 (A) 10/09/2023   HGBA1C 8.0 (A) 07/04/2023   MICROALBUR 80 10/09/2023   MICROALBUR 30 mg'l 10/10/2022  Lipid Panel ( most recent) Lipid Panel     Component Value Date/Time   CHOL 138 11/12/2016 0101   TRIG 372 (A) 08/23/2022 0000   HDL 25 (L) 11/12/2016 0101    CHOLHDL 5.5 11/12/2016 0101   VLDL 39 11/12/2016 0101   LDLCALC 115 08/23/2022 0000      Lab Results  Component Value Date   TSH 1.17 08/23/2022   TSH 1.805 02/06/2015           Assessment & Plan:   1) Type 2 diabetes mellitus with stage 3a chronic kidney disease, without long-term current use of insulin (HCC)  He presents today with his meter showing stable, at target glycemic profile overall.  His POCT A1c today is 7.8%,increasing slightly from last visit of 7.3%.  Analysis of his meter shows 7-day average of 154, 14-day average of 148, 30-day average of 143.  He denies any hypoglycemia.  [facility] Jardiance [facility], even though he gets this from [facility]AP.  He notes he gets "hot after he goes pee" and is wondering what that could be from.  - Tomasz Steeves has currently uncontrolled symptomatic type 2 DM since 78 years of age.   -Recent labs reviewed.  His POCT UM shows mild microalbuminuria consistent with his stage 3a kidney disease.  - I had a long discussion with him about the progressive nature of diabetes and the pathology behind its complications. -his diabetes is complicated by mild CKD and he remains at a high risk for more acute and chronic complications which include CAD, CVA, CKD, retinopathy, and neuropathy. These are all discussed in detail with him.  The following Lifestyle Medicine recommendations according to American College of Lifestyle Medicine [facility]) were discussed and offered to patient and he agrees to start the journey:  A. Whole Foods, Plant-based plate comprising of fruits and vegetables, plant-based proteins, whole-grain carbohydrates was discussed in detail with the patient.   A list for source of those nutrients were also provided to the patient.  Patient will use only water or unsweetened tea for hydration. B.  The need to stay away from risky substances including alcohol, smoking; obtaining 7 to 9 hours of restorative sleep, at  least 150 minutes of moderate intensity exercise weekly, the importance of healthy social connections,  and stress reduction techniques were discussed. C.  A full color page of  Calorie density of various food groups per pound showing examples of each food groups was provided to the patient.  - Nutritional counseling repeated at each appointment due to patients tendency to fall back in to old habits.  - The patient admits there is a room for improvement in their diet and drink choices. -  Suggestion is made for the patient to avoid simple carbohydrates from their diet including Cakes, Sweet Desserts / Pastries, Ice Cream, Soda (diet and regular), Sweet Tea, Candies, Chips, Cookies, Sweet Pastries, Store Bought Juices, Alcohol in Excess of 1-2 drinks a day, Artificial Sweeteners, Coffee Creamer, and "Sugar-free" Products. This will help patient to have stable blood glucose profile and potentially avoid unintended weight gain.   - I encouraged the patient to switch to unprocessed or minimally processed complex starch and increased protein intake (animal or plant source), fruits, and vegetables.   - Patient is advised to stick to a routine mealtimes to eat 3 meals a day and avoid unnecessary snacks (to snack only to correct hypoglycemia).  - I have approached him with the following  individualized plan to manage his diabetes and patient agrees:   - he is advised to continue Glimepiride 2 mg po twice daily with meals, therapeutically suitable for patient, Tradjenta 5 mg po daily, and Jardiance 10 mg po daily. He gets his [place] and Tradjenta from patient assistance.  [facility] Jardiance [facility].  -he is encouraged to start monitoring glucose twice daily, before breakfast and before bed, and to call the clinic if he has readings less than 70 or above 300 for 3 tests in a row.  - Adjustment parameters are given to him for hypo and hyperglycemia in writing. -  he is encouraged to call clinic for blood glucose levels less than 70 or above 300 mg /dl.  - Specific targets for  A1c; LDL, HDL, and Triglycerides were discussed with the patient.  2) Blood Pressure /Hypertension:  his blood pressure is controlled to target.   he is advised to continue his current medications including Hydralazine 75 mg p.o. TID and Metoprolol 25 mg po daily.  3) Lipids/Hyperlipidemia:    Review of his recent lipid panel from 08/24/23 showed controlled LDL at 40 and elevated triglycerides of 273 (improving).  He is statin intolerant says his PCP started him on Repatha.  He notes this has become too expensive now that Medicare has changed.  I encouraged him to reach out to the original prescriber about doing a patient assistance application.  4)  Weight/Diet:  his Body mass index is 32.69 kg/m.  -  clearly complicating his diabetes care.   he is a candidate for weight loss. I discussed with him the fact that loss of 5 - 10% of his  current body weight will have the most impact on his diabetes management.  Exercise, and detailed carbohydrates information provided  -  detailed on discharge instructions.  5) Chronic Care/Health Maintenance: -he is not on ACEI/ARB and is intolerant to Statin medications and is encouraged to initiate and continue to follow up with Ophthalmology, Dentist, Podiatrist at least yearly or according to recommendations, and advised to stay away from smoking. I have recommended yearly flu vaccine and pneumonia vaccine at least every 5 years; moderate intensity exercise for up to 150 minutes weekly; and sleep for at least 7 hours a day.  - he is advised to maintain close follow up with Elfredia Nevins, MD for primary care needs, as well as his other providers for optimal and coordinated care.  He asked me about handicap sticker today.  I deferred this to his PC as we do not fill out that paperwork here.     I spent  34  minutes in the care of the patient  today including review of labs from CMP, Lipids, Thyroid Function, Hematology (current and previous including abstractions from other facilities); face-to-face time discussing  his blood glucose readings/logs, discussing hypoglycemia and hyperglycemia episodes and symptoms, medications doses, his options of short and long term treatment based on the latest standards of care / guidelines;  discussion about incorporating lifestyle medicine;  and documenting the encounter. Risk reduction counseling performed per USPSTF guidelines to reduce obesity and cardiovascular risk factors.     Please refer to Patient Instructions for Blood Glucose Monitoring and Insulin/Medications Dosing Guide"  in media tab for additional information. Please  also refer to " Patient Self Inventory" in the Media  tab for reviewed elements of pertinent patient history.  [name] participated in the discussions, expressed understanding, and  voiced agreement with the above plans.  All questions were answered to his satisfaction. he is encouraged to contact clinic should he have any questions or concerns prior to his return visit.     Follow up plan: - Return in about 4 months (around 05/15/2024) for Diabetes F/U with A1c in office.  Ronny Bacon, [facility] [address] [place], [place] [address] Phone: [phone] Fax: [phone]  01/16/2024, 11:50 AM

## 2024-01-22 ENCOUNTER — Ambulatory Visit (HOSPITAL_COMMUNITY)
Admission: RE | Admit: 2024-01-22 | Discharge: 2024-01-22 | Disposition: A | Discharge status: Home / Self Care | Payer: Medicare HMO | Source: Ambulatory Visit | Attending: Surgery | Admitting: Surgery

## 2024-01-22 ENCOUNTER — Ambulatory Visit (INDEPENDENT_AMBULATORY_CARE_PROVIDER_SITE_OTHER): Payer: Medicare HMO | Admitting: Physician Assistant

## 2024-01-22 ENCOUNTER — Ambulatory Visit (INDEPENDENT_AMBULATORY_CARE_PROVIDER_SITE_OTHER)
Admission: RE | Admit: 2024-01-22 | Discharge: 2024-01-22 | Disposition: A | Discharge status: Home / Self Care | Payer: Medicare HMO | Source: Ambulatory Visit | Attending: Surgery | Admitting: Surgery

## 2024-01-22 VITALS — BP 162/84 | HR 59 | Temp 97.6°F | Ht 68.0 in | Wt 215.6 lb

## 2024-01-22 DIAGNOSIS — I739 Peripheral vascular disease, unspecified: Secondary | ICD-10-CM

## 2024-01-22 LAB — VAS US ABI WITH/WO TBI
Left ABI: 0.89
Right ABI: 0.71

## 2024-01-25 NOTE — Progress Notes (Signed)
Office Note   History of Present Illness   Timothy [name] is a 78 y.o. (10/26/1946) male who presents for surveillance of PAD.  He has undergone the following vascular procedures:  1) left SFA and popliteal artery atherectomy with DCBA on 08/28/2018 2) left SFA stenting on 07/09/2019 3) left SFA stenting on 06/13/2023  He has a history of bilateral lower extremity claudication, left greater than right.  His left lower extremity claudication resolved after SFA stenting.  He has tibial disease on the right, which would not be intervened on for claudication.  He was started on Pletal at his last visit 6 months ago to see if this would help alleviate his symptoms.  He returns today for follow-up.  He states his left leg still feels great, without claudication.  He is still experiencing right calf claudication.  He denies any rest pain or tissue loss.  He has not been taking his Pletal daily.  He says that he only takes it "every once in a while" if he anticipates longer distances of walking.  He does not feel like Pletal has helped with his symptoms at all.  Current Outpatient Medications  Medication Sig Dispense Refill   acetaminophen (TYLENOL) 500 MG tablet Take 500 mg by mouth every 6 (six) hours as needed (pain).     aspirin 81 MG chewable tablet Chew 1 tablet (81 mg total) by mouth daily.     carvedilol (COREG) 12.5 MG tablet TAKE 1 TABLET BY MOUTH TWICE A DAY 180 tablet 3   cilostazol (PLETAL) 100 MG tablet Take 1 tablet (100 mg total) by mouth 2 (two) times daily before a meal. 60 tablet 11   clopidogrel (PLAVIX) 75 MG tablet Take 1 tablet (75 mg total) by mouth daily. 30 tablet 11   diclofenac Sodium (VOLTAREN) 1 % GEL Apply 2 g topically daily as needed (pain).     empagliflozin (JARDIANCE) 10 MG TABS tablet Take 1 tablet (10 mg total) by mouth daily before breakfast. 90 tablet 3   Evolocumab (REPATHA SURECLICK) 140 MG/ML SOAJ INJECT 140 MG INTO THE SKIN EVERY 14 (FOURTEEN) DAYS. 6 mL  3   glimepiride (AMARYL) 2 MG tablet Take 1 tablet (2 mg total) by mouth 2 (two) times daily. 180 tablet 3   losartan (COZAAR) 25 MG tablet TAKE 1 TABLET (25 MG TOTAL) BY MOUTH DAILY. 90 tablet 3   TRADJENTA 5 MG TABS tablet Take 1 tablet (5 mg total) by mouth daily. 90 tablet 3   No current facility-administered medications for this visit.    REVIEW OF SYSTEMS (negative unless checked):   Cardiac:  []  Chest pain or chest pressure? []  Shortness of breath upon activity? []  Shortness of breath when lying flat? []  Irregular heart rhythm?  Vascular:  [x]  Pain in calf, thigh, or hip brought on by walking? []  Pain in feet at night that wakes you up from your sleep? []  Blood clot in your veins? []  Leg swelling?  Pulmonary:  []  Oxygen at home? []  Productive cough? []  Wheezing?  Neurologic:  []  Sudden weakness in arms or legs? []  Sudden numbness in arms or legs? []  Sudden onset of difficult speaking or slurred speech? []  Temporary loss of vision in one eye? []  Problems with dizziness?  Gastrointestinal:  []  Blood in stool? []  Vomited blood?  Genitourinary:  []  Burning when urinating? []  Blood in urine?  Psychiatric:  []  Major depression  Hematologic:  []  Bleeding problems? []  Problems with blood clotting?  Dermatologic:  []   Rashes or ulcers?  Constitutional:  []  Fever or chills?  Ear/Nose/Throat:  []  Change in hearing? []  Nose bleeds? []  Sore throat?  Musculoskeletal:  []  Back pain? []  Joint pain? []  Muscle pain?   Physical Examination   Vitals:   01/22/24 1250  BP: (!) 162/84  Pulse: (!) 59  Temp: 97.6 F (36.4 C)  SpO2: 97%  Weight: 215 lb 9.6 oz (97.8 kg)  Height: 5\' 8"  (1.727 m)   Body mass index is 32.78 kg/m.  General:  WDWN in NAD; vital signs documented above Gait: Not observed HENT: WNL, normocephalic Pulmonary: normal non-labored breathing , without rales, rhonchi,  wheezing Cardiac: regular Abdomen: soft, NT, no masses Skin:  without rashes Vascular Exam/Pulses: Nonpalpable pedal pulses bilaterally. Monophasic right PT doppler signal. Brisk DP/PT doppler signals on the left Extremities: without ischemic changes, without gangrene , without cellulitis; without open wounds;  Musculoskeletal: no muscle wasting or atrophy  Neurologic: A&O X 3;  No focal weakness or paresthesias are detected Psychiatric:  The pt has Normal affect.  Non-Invasive Vascular imaging   ABI (01/22/2024) R:  ABI: 0.71 (0.76),  PT: mono DP:  barely audible TBI:  0.45 L:  ABI: 0.89 (0.9),  PT: mono DP: mono TBI: 0.54   LLE Arterial Duplex (01/22/2024) Patent left SFA stent without stenosis  Medical Decision Making   Timothy [name] is a 78 y.o. male who presents for surveillance of PAD  Based on the patient's vascular studies, his ABIs are essentially unchanged from his last visit.  His right ABI 0.71 and left ABI is 0.89 Arterial duplex demonstrates a patent left SFA stent without stenosis He denies any return of claudication symptoms in the left lower extremity.  He denies any rest pain or tissue loss of bilateral lower extremities. He does endorse continued, unchanged right calf claudication.  He has not been taking his Pletal daily since it was first prescribed 6 months ago.  I have explained to the patient that the only way he would experience benefit from Pletal was if he took it every day.  He will start taking this medication every day On exam he has nonpalpable pedal pulses.  He does have intact Doppler signals of bilateral lower extremities He will continue his aspirin, Plavix, and Pletal.  He can follow-up with our office in 1 year with repeat ABIs   [name] PA-C [facility] Office: [phone]  Call MD: Chestine Spore

## 2024-02-05 ENCOUNTER — Encounter: Payer: Self-pay | Admitting: Cardiology

## 2024-02-05 ENCOUNTER — Ambulatory Visit: Payer: Medicare HMO | Attending: Cardiology | Admitting: Cardiology

## 2024-02-05 VITALS — BP 134/82 | HR 68 | Ht 68.0 in | Wt 215.6 lb

## 2024-02-05 DIAGNOSIS — E782 Mixed hyperlipidemia: Secondary | ICD-10-CM

## 2024-02-05 DIAGNOSIS — I2510 Atherosclerotic heart disease of native coronary artery without angina pectoris: Secondary | ICD-10-CM | POA: Diagnosis not present

## 2024-02-05 DIAGNOSIS — I255 Ischemic cardiomyopathy: Secondary | ICD-10-CM | POA: Diagnosis not present

## 2024-02-05 DIAGNOSIS — I10 Essential (primary) hypertension: Secondary | ICD-10-CM

## 2024-02-05 NOTE — Progress Notes (Signed)
 Clinical Summary Timothy [name] is a 78 y.o.male seen today for follow up of the following medical problems.    1. HTN  - norvasc that we started last visit caused foot pain, stopped and resolved.  -he had concerns hydralazine was causing cramps. We tried stopping, cramps did resolved off medication.    - compliant with meds     2. Hyperlipidemia - intolerant to statins, has been on zetia only. He reports muscle aches on zetia, now off.  - labs followed by pcp   - 01/2020 TC 206 TG 364 HDL 28 LDL 114 -prior authorization for repatha approved - 08/2023 TC 113 TG 273 HDL 31 LDL 40 - recent A1c 7.8   - taking repatha - upcoming labs with pcp     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 Echo LVEF 50-55%, inferior hypokinesis, grade I diastolic dysfunction. - admit 11/2016 with NSTEMI. Cath as reported below. Received balloon angioplasty of mid to distal LCX, too small to be stented.  - echo 11/2016 LVEF 40-45%.       - Jan 2019 nuclear stress small moderate intensity partially reversible mid to apical anterior defect consistent with mild ischemia. LVEF 25% by nuclear, however echo 02/2018 showed LVEF stabel 40-45%.   07/2022 echo: LVEF 45-50%  - no chest pains, no SOB/DOE    4. PAD - 07/2018 ABIs right 1.12, left 0.8 - followed by vascular. Has had left SFA stenting on 2 separate occasions most recent 06/2023   - Jan 2021 normal ABIs, normal left SFA stent.  - followed by vascular      5. History of bladder cancer - followed by urology, s/p recesection Notes mention low volume prostate cancer as well   AAA screen: neg [place] 2018 Past Medical History:  Diagnosis Date   Arthritis    Bladder cancer (HCC)    Blood clot in vein 2018   left   CAD (coronary artery disease)    a. 06/2014: NSTEMI s/p PTCA of LCx, 60-80% residual in distal LAD, 70% prox small D1.   b. 02/06/15 NSTEMI  s/p successful PCI/DES to mid RCA c. angioplasty alone to  LCx in 11/2016   Decreased GFR 45 % per 01-04-2021 labs   Diabetes mellitus, type 2 (HCC)    Elevated PSA    HLD (hyperlipidemia)    HOH (hard of hearing)    Hypertension    NSTEMI (non-ST elevated myocardial infarction) (HCC) 2015   Peripheral vascular disease (HCC)    Poor historian    Wears hearing aid in right ear      Allergies  Allergen Reactions   Lipitor [Atorvastatin] Other (See Comments)    Myalgia   Other Other (See Comments)    All Cholesterol Meds - causes muscle and joint pain   Pravastatin Other (See Comments)    Myalgia     Current Outpatient Medications  Medication Sig Dispense Refill   acetaminophen (TYLENOL) 500 MG tablet Take 500 mg by mouth every 6 (six) hours as needed (pain).     aspirin 81 MG chewable tablet Chew 1 tablet (81 mg total) by mouth daily.     carvedilol (COREG) 12.5 MG tablet TAKE 1 TABLET BY MOUTH TWICE A DAY 180 tablet 3   cilostazol (PLETAL) 100 MG tablet Take 1 tablet (100 mg total) by mouth 2 (two) times daily before a meal. 60 tablet 11   clopidogrel (PLAVIX) 75 MG tablet Take 1  tablet (75 mg total) by mouth daily. 30 tablet 11   diclofenac Sodium (VOLTAREN) 1 % GEL Apply 2 g topically daily as needed (pain).     empagliflozin (JARDIANCE) 10 MG TABS tablet Take 1 tablet (10 mg total) by mouth daily before breakfast. 90 tablet 3   Evolocumab (REPATHA SURECLICK) 140 MG/ML SOAJ INJECT 140 MG INTO THE SKIN EVERY 14 (FOURTEEN) DAYS. 6 mL 3   glimepiride (AMARYL) 2 MG tablet Take 1 tablet (2 mg total) by mouth 2 (two) times daily. 180 tablet 3   losartan (COZAAR) 25 MG tablet TAKE 1 TABLET (25 MG TOTAL) BY MOUTH DAILY. 90 tablet 3   TRADJENTA 5 MG TABS tablet Take 1 tablet (5 mg total) by mouth daily. 90 tablet 3   No current facility-administered medications for this visit.     Past Surgical History:  Procedure Laterality Date   ABDOMINAL AORTOGRAM W/LOWER EXTREMITY N/A 08/28/2018   Procedure: ABDOMINAL AORTOGRAM W/LOWER EXTREMITY;   Surgeon: Nada Libman, MD;  Location: [facility];  Service: Cardiovascular;  Laterality: N/A;  bilateral   ABDOMINAL AORTOGRAM W/LOWER EXTREMITY N/A 07/09/2019   Procedure: ABDOMINAL AORTOGRAM W/LOWER EXTREMITY;  Surgeon: Nada Libman, MD;  Location: [facility];  Service: Cardiovascular;  Laterality: N/A;  unilateral Lt   ABDOMINAL AORTOGRAM W/LOWER EXTREMITY N/A 06/13/2023   Procedure: ABDOMINAL AORTOGRAM W/LOWER EXTREMITY;  Surgeon: Nada Libman, MD;  Location: [facility];  Service: Cardiovascular;  Laterality: N/A;   CARDIAC CATHETERIZATION  02/06/2015   Procedure: CORONARY STENT INTERVENTION;  Surgeon: Marykay Lex, MD;  Location: [facility] CATH LAB;  Service: Cardiovascular;;  Mid RCA   CARDIAC CATHETERIZATION N/A 11/11/2016   Procedure: Left Heart Cath and Coronary Angiography;  Surgeon: Corky Crafts, MD;  Location: [facility] INVASIVE CV LAB;  Service: Cardiovascular;  Laterality: N/A;   CARDIAC CATHETERIZATION N/A 11/11/2016   Procedure: Coronary Balloon Angioplasty;  Surgeon: Corky Crafts, MD;  Location: [facility] INVASIVE CV LAB;  Service: Cardiovascular;  Laterality: N/A;   COLONOSCOPY N/A 01/14/2019   Procedure: COLONOSCOPY;  Surgeon: Malissa Hippo, MD;  Location: [facility];  Service: Endoscopy;  Laterality: N/A;  730   CORONARY ANGIOPLASTY  11/11/2016   Mid Cx to Dist Cx lesion, 100 %stenosed. This lesion was treated with balloon angioplasty with a 2.0 balloon   CYSTOSCOPY W/ RETROGRADES N/A 05/05/2015   Procedure: CYSTOSCOPY WITH BILATERAL RETROGRADE PYELOGRAMS AND BLADDER BIOPSIES;  Surgeon: Marcine Matar, MD;  Location: [facility];  Service: Urology;  Laterality: N/A;   CYSTOSCOPY W/ RETROGRADES Bilateral 07/31/2018   Procedure: CYSTOSCOPY WITH BILATERAL RETROGRADE PYELOGRAM;  Surgeon: Marcine Matar, MD;  Location: [facility];  Service: Urology;  Laterality: Bilateral;   CYSTOSCOPY W/ RETROGRADES Right 02/15/2021   Procedure: CYSTOSCOPY WITH RETROGRADE  PYELOGRAM;  Surgeon: Marcine Matar, MD;  Location: [facility];  Service: Urology;  Laterality: Right;   CYSTOSCOPY WITH BIOPSY N/A 07/31/2018   Procedure: CYSTOSCOPY WITH BIOPSY;  Surgeon: Marcine Matar, MD;  Location: [facility];  Service: Urology;  Laterality: N/A;   CYSTOSCOPY WITH BIOPSY N/A 02/15/2021   Procedure: CYSTOSCOPY WITH BIOPSY;  Surgeon: Marcine Matar, MD;  Location: [facility];  Service: Urology;  Laterality: N/A;   LEFT HEART CATHETERIZATION WITH CORONARY ANGIOGRAM N/A 06/17/2014   Procedure: LEFT HEART CATHETERIZATION WITH CORONARY ANGIOGRAM;  Surgeon: Marykay Lex, MD;  Location: [facility] CATH LAB;  Service: Cardiovascular;  Laterality: N/A;   LEFT HEART CATHETERIZATION WITH CORONARY ANGIOGRAM  N/A 02/06/2015   Procedure: LEFT HEART CATHETERIZATION WITH CORONARY ANGIOGRAM;  Surgeon: Marykay Lex, MD;  Location: [facility] CATH LAB;  Service: Cardiovascular;  Laterality: N/A;   PERIPHERAL VASCULAR ATHERECTOMY  08/28/2018   Procedure: PERIPHERAL VASCULAR ATHERECTOMY;  Surgeon: Nada Libman, MD;  Location: [facility];  Service: Cardiovascular;;  Prox lt.SFA, Distal SFA   PERIPHERAL VASCULAR INTERVENTION  07/09/2019   Procedure: PERIPHERAL VASCULAR INTERVENTION;  Surgeon: Nada Libman, MD;  Location: [facility];  Service: Cardiovascular;;  Lt. SFA   PERIPHERAL VASCULAR INTERVENTION  06/13/2023   Procedure: PERIPHERAL VASCULAR INTERVENTION;  Surgeon: Nada Libman, MD;  Location: [facility];  Service: Cardiovascular;;  Lt SFA   POLYPECTOMY  01/14/2019   Procedure: POLYPECTOMY;  Surgeon: Malissa Hippo, MD;  [facility];  Service: Endoscopy;;  colon   PROSTATE BIOPSY N/A 03/31/2014   Procedure: BIOPSY TRANSRECTAL ULTRASONIC PROSTATE (TUBP);  Surgeon: Marcine Matar, MD;  Location: [facility];  Service: Urology;  Laterality: N/A;   PROSTATE BIOPSY N/A 02/15/2021   Procedure: BIOPSY TRANSRECTAL  ULTRASONIC PROSTATE (TUBP);  Surgeon: Marcine Matar, MD;  Location: [facility];  Service: Urology;  Laterality: N/A;  45 MINS   TRANSURETHRAL RESECTION OF BLADDER TUMOR N/A 03/14/2013   Procedure: TRANSURETHRAL RESECTION OF BLADDER TUMOR (TURBT);  Surgeon: Marcine Matar, MD;  Location: [facility];  Service: Urology;  Laterality: N/A;  1 HR WITH MITOMYCIN INSTILLATION    TRANSURETHRAL RESECTION OF BLADDER TUMOR N/A 09/30/2013   Procedure: TRANSURETHRAL RESECTION OF BLADDER TUMOR (TURBT);  Surgeon: Marcine Matar, MD;  Location: [facility];  Service: Urology;  Laterality: N/A;   TRANSURETHRAL RESECTION OF BLADDER TUMOR N/A 03/31/2014   Procedure: TRANSURETHRAL RESECTION OF BLADDER TUMOR (TURBT);  Surgeon: Marcine Matar, MD;  Location: [facility];  Service: Urology;  Laterality: N/A;   TRANSURETHRAL RESECTION OF BLADDER TUMOR N/A 05/31/2015   Procedure: Cystoscopy with clott evacuation and fulgeration of bleeders, retrograde pylegram;  Surgeon: Sebastian Ache, MD;  Location: [facility];  Service: Urology;  Laterality: N/A;     Allergies  Allergen Reactions   Lipitor [Atorvastatin] Other (See Comments)    Myalgia   Other Other (See Comments)    All Cholesterol Meds - causes muscle and joint pain   Pravastatin Other (See Comments)    Myalgia      Family History  Problem Relation Age of Onset   Cancer Child    Asthma Mother      Social History Mr. Mccaffrey reports that he quit smoking about 30 years ago. His smoking use included cigarettes. He started smoking about 60 years ago. He has a 30 pack-year smoking history. He has never used smokeless tobacco. Mr. Bruington reports no history of alcohol use.  Marland Kitchen   Physical Examination Today's Vitals   02/05/24 1139  BP: 134/82  Pulse: 68  SpO2: 97%  Weight: 215 lb 9.6 oz (97.8 kg)  Height: 5\' 8"  (1.727 m)   Body mass index is 32.78 kg/m.  Gen: resting  comfortably, no acute distress HEENT: no scleral icterus, pupils equal round and reactive, no palptable cervical adenopathy,  CV: RRR, no mrg, no jvd Resp: Clear to auscultation bilaterally GI: abdomen is soft, non-tender, non-distended, normal bowel sounds, no hepatosplenomegaly MSK: extremities are warm, no edema.  Skin: warm, no rash Neuro:  no focal deficits Psych: appropriate affect   Diagnostic Studies  06/2014 Cath FINDINGS:   Hemodynamics:  Central Aortic Pressure / Mean: 108/62/83 mmHg Left Ventricular Pressure / LVEDP: 107/7/17 mmHg   Left Ventriculography: EF: ~45% % Wall Motion: global Hypokinesis   Coronary Anatomy: Dominance: Right Left Main: Large caliber, long trunk that trifurcates into the LAD, Circumflex & Ramus Intermedius. Angiographically normal. LAD: Begins as a large caliber vessel that tapers into a relatively small caliber (~1.5-2.0 mm) vessel distally.  It wraps the apex, perfusing the distal 1/3 of the infero-apex. The distal vessel has tandem 60&70-80% focal lesions prior to the apex. D1: Very small caliber bifurcating vessel with proximal ~70% stenosis.   Left Circumflex: Begins as a large caliber vessel that also tapers to a small to moderate caliber vessel in the AV Groove where it gives off a small caliber OM1 Lakin Romer and is 100% occluded just beyond that point.    Post-PTCA: beyond the occluded AV Groove Circumflex, there is a small to moderate caliber LPL Concettina Leth.   Ramus intermedius: Large caliber vessel that bifurcates in the mid-vessel into to small to moderate caliber branches that both reach the apex.    RCA: Large caliber, dominant vessel with diffuse ~20-40% lesions, but no obstructive disease.  It bifurcates distally in to a small caliber, short PDA and Right Posterior AV Groove Cayetano Mikita (RPAV) branches.  RPAV gives off several very small banches.   After reviewing the initial angiography, the culprit lesion was thought to be the occluded  distal-AV Groove circumflesx.  Preparation were made to proceed with PTCA on this lesion.   Percutaneous Coronary Intervention:   Guide: 6 Fr   CLS 3.5  -- very difficult guide support, catheter moved & disengaged with patient movement Guidewire: BMW (advanced initially in to an atrial Kysa Calais for support & balloon advanced into proximal Circumflex allowing for further advancmemt of the wire beyond the culprit lesion. Predilation Balloon #1: Emerge 1.5 mm x 12 mm; Used to assist with wire support to advance guidewire 6 Atm x 30 Sec, 8 Atm x 30 Sec Post inflation revealed a small to moderate follow-on circumflex with LPL Margan Elias.    Due to the relatively small sized distal vessel, the decision was to perform PTCA only. Balloon #2: Euphora 2.0 mm x 12 mm;   8 Atm x 90 Sec x 2 with IC NTG - no recoil Final Diameter: ~2.0 mm   Post deployment angiography in multiple views, with and without guidewire in place revealed excellent stent deployment and lesion coverage.  There was no evidence of dissection or perforation.   POST-OPERATIVE DIAGNOSIS:   Severe 2 vessel disease in small caliber vessel - distal AV Groove Circumflex 100% and distal LAD ~60&80% lesions (1.5 mm vessel) Successful PTCA of the occluded AV Groove Circumflex with Stent-like result. Moderately reduced LVEF by LV Gram Normal LVEDP     06/2014 Echo Study Conclusions  - Left ventricle: The cavity size was normal. Wall thickness was   normal. Systolic function was normal. The estimated ejection   fraction was in the range of 50% to 55%. Inferior hypokinesis.   Doppler parameters are consistent with abnormal left ventricular   relaxation (grade 1 diastolic dysfunction). The E/e&' ratio is   15, suggesting borderline elevated LV filling pressure. - Aortic valve: Trileaflet. Sclerosis without stenosis. There was   no regurgitation. - Left atrium: The atrium was normal in size. - Tricuspid valve: There was mild regurgitation. -  Pulmonary arteries: PA peak pressure: 33 mm Hg (S).  Impressions:  - LVEF 50-55%, inferior hypokinesis, mild TR, top normal  RVSP,   diastolic dysfunction with borderline elevated LV filling   pressure.     11/2016 echo Study Conclusions   - Left ventricle: The cavity size was normal. Wall thickness was   normal. Systolic function was mildly to moderately reduced. The   estimated ejection fraction was in the range of 40% to 45%. There   is akinesis of the basal-midinferior and inferoseptal myocardium.   Doppler parameters are consistent with abnormal left ventricular   relaxation (grade 1 diastolic dysfunction). - Aortic valve: Trileaflet; mildly thickened, mildly calcified   leaflets. There was trivial regurgitation.   Impressions:   - EF is mildly reduced when compared to prior.     11/2016 cath There is mild left ventricular systolic dysfunction. The left ventricular ejection fraction is 45-50% by visual estimate. Mid RCA-2 lesion, 25 %stenosed. Patent mid RCA stent. Ost 2nd Diag to 2nd Diag lesion, 80 %stenosed. Unchanged from prior. Dist LAD lesion, 60 %stenosed. Unchanged from prior. Mid Cx to Dist Cx lesion, 100 %stenosed. This lesion was treated with balloon angioplasty with a 2.0 balloon. Several inflations were performed. Post intervention, there is a 0% residual stenosis. The vessel was too small to be stented. There is also significant tortuosity in the proximal vessel. LV end diastolic pressure is mildly elevated.   Initially, Brilinta was given for antiplatelet therapy in the Cath Lab. Since he did not receive a stent, we will transition him to clopidogrel.  He needs aggressive secondary prevention including lipid-lowering therapy. He has been intolerant to statins in the past. Would need to consider PCS K9 inhibitor along with other secondary prevention. I anticipate he'll be here 2 days in the hospital. Echocardiogram would be reasonable to check ejection fraction  and mitral apparatus.   LAD and diagonal disease was not addressed since they appeared unchanged angiographically. If he has refractory angina, could address the LAD lesion at a later time.   Jan 2018 AAA [place] No AAA     Jan 2019 nuclear stress No diagnostic ST segment changes. Occasional PVCs noted throughout the study. Hypertensive response. Technically low risk Duke treadmill score 4.5. Blood pressure demonstrated a hypertensive response to exercise. Small, moderate intensity, partially reversible mid to apical anterior defect consistent with ischemia. This is a high risk study predominantly based on calculated LVEF. Consider echocardiogram for further confirmation. Nuclear stress EF: 25%.     02/2018 echo Study Conclusions   - Left ventricle: Mid and basal inferior wall hypokinesis. The   cavity size was mildly dilated. Wall thickness was normal.   Systolic function was mildly to moderately reduced. The estimated   ejection fraction was in the range of 40% to 45%. - Aortic valve: There was trivial regurgitation. Valve area (VTI):   1.51 cm^2. Valve area (Vmax): 1.75 cm^2. Valve area (Vmean): 1.64   cm^2. - Atrial septum: No defect or patent foramen ovale was identified. - Pulmonary arteries: PA peak pressure: 37 mm Hg (S).     07/2022 echo 1. Left ventricular ejection fraction, by estimation, is 45 to 50%. The  left ventricle has mildly decreased function. The left ventricle has no  regional wall motion abnormalities. Left ventricular diastolic parameters  are indeterminate. The average left   ventricular global longitudinal strain is -17.3 %.   2. Right ventricular systolic function is normal. The right ventricular  size is normal. Tricuspid regurgitation signal is inadequate for assessing  PA pressure.   3. The mitral valve is normal in structure. Mild mitral valve  regurgitation. No evidence of mitral stenosis.   4. The tricuspid valve is abnormal.   5. The aortic valve  is tricuspid. There is mild calcification of the  aortic valve. There is mild thickening of the aortic valve. Aortic valve  regurgitation is mild. Aortic valve sclerosis/calcification is present,  without any evidence of aortic  stenosis.   6. The inferior vena cava is normal in size with greater than 50%  respiratory variability, suggesting right atrial pressure of 3 mmHg.      Assessment and Plan  1. HTN - bp is at goal, continue current meds   2. Hyperlipidemia - history of CAD and PAD - intolerant to statins and zetia.  - on repatha, LDL is at goal. TGs should improve as glycemic control improves - continue current meds   3. CAD/ICM - Medical therapy limited by renal dysfunction and mixed compliance. Most recent echo low normal to mildly decreased LVEF. - no symptoms, continue current meds     Antoine Poche, M.D.

## 2024-02-05 NOTE — Patient Instructions (Signed)
 Medication Instructions:  Continue all current medications.   Labwork: none  Testing/Procedures: none  Follow-Up: 6 months   Any Other Special Instructions Will Be Listed Below (If Applicable).   If you need a refill on your cardiac medications before your next appointment, please call your pharmacy.

## 2024-02-06 ENCOUNTER — Other Ambulatory Visit (HOSPITAL_COMMUNITY): Payer: Self-pay

## 2024-02-06 ENCOUNTER — Telehealth: Payer: Self-pay | Admitting: Pharmacy Technician

## 2024-02-06 NOTE — Telephone Encounter (Signed)
 The pt already got the rx on 01/27/24. I asked [facility] to [facility] so pt could get a refund. Pt will get 140.00 refund per [facility].

## 2024-02-06 NOTE — Telephone Encounter (Signed)
 Patient Advocate Encounter   The patient was approved for a [employer] grant that will help cover the cost of repatha Total amount awarded, 2500.  Effective: 01/07/24 - 01/05/25   [id number] WUJ:[id number]   Pharmacy provided with approval and processing information. Patient informed via telephone

## 2024-03-05 DIAGNOSIS — E6609 Other obesity due to excess calories: Secondary | ICD-10-CM | POA: Diagnosis not present

## 2024-03-05 DIAGNOSIS — Z6833 Body mass index (BMI) 33.0-33.9, adult: Secondary | ICD-10-CM | POA: Diagnosis not present

## 2024-03-05 DIAGNOSIS — R1013 Epigastric pain: Secondary | ICD-10-CM | POA: Diagnosis not present

## 2024-03-07 ENCOUNTER — Encounter: Payer: Self-pay | Admitting: *Deleted

## 2024-03-11 NOTE — Progress Notes (Signed)
 History of Present Illness:     Timothy [name] presents for the following issues   BPH:    Patient is currently treated with Alfuzosin for his symptoms.  He is pleased with his symptoms currently   . Bladder Cancer:   Initial TURBT on 4.10.2014. He had papillary, low-grade, NMIBC. He eventually was found to have recurrence within 6 months, and underwent repeat TURBT on 10.27.2014. Again, this was low-grade, NMIBC. Because of his recurrence in a short period of time, despite using mitomycin after his first and second treatments, he underwent BCG induction, which was completed in January, 2014. He did have a significant inflammatory response to the mitomycin.    In April, 2015 at routine follow-up, he was found to have 2 recurrences, one approximately 1.5 and 1 approximately 1.0 cm. He underwent TURBT on 4.27.2015. This revealed high-grade NMIBC. He completed a second BCG induction course in early August, 2015. He subsequently underwent cystoscopy, bilateral retrogrades, and bladder biopsy on 5.3.2016. Biopsies of the bladder were negative. Retrograde studies were normal. He has had significant dysuria during his treatments. He has completed 3 maintenance BCG treatments, the last of which was 11.29.2016.    Because of the patient's significant side effects from his BCG i.e. dysuria, bladder pain, we stopped maintenance therapy after his 11.29.2016 dose.    8.26.2019: Because of positive cytologies, he underwent cystoscopy, bilateral retrogrades/renal washings, bladder barbotage and bladder biopsy. Bladder washings were negative, barbotage specimen negative, biopsies revealed only benign/inflammatory change.     3.14.2022: Repeat anesthetic cystoscopy and biopsy revealed benign urothelial changes with chronic inflammation.  No carcinoma identified.   4.9.2024: Cystoscopy negative, cytology revealed atypical cells. 10.8.2024: Cystoscopy negative, cytology revealed atypical cells again.  Prostate  Cancer:   He is participating in active surveillance.    Because of a rising PSA, he underwent a concurrent transrectal ultrasound and biopsy of his prostate on 4.27.2015. Prostatic volume was 53.55 cm. PSA was 9.13. PSA density 0.17. 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 That biopsy was left base lateral, and less than 5% of the core was involved. There was no granulomatous reaction noted. He underwent repeat/surveillance biopsy on 1.8.2015. Prostatic volume was measured at 53.5 mL. All 12 cores returned negative for adenocarcinoma, with 1 core in the right apex revealing chronic granulomatous inflammation.    2.11.2020: PSA 8.7  3.23.2021: PSA 6.2 11.2.2021: PSA 8.0   3.14.2022: Repeat ultrasound and biopsy of the prostate.  No adenocarcinoma found.  Most cores had focal inflammation and granulomas.  Prostate volume 74 mL.  5.9.2023: PSA 5.4 10.8.2024: PSA 3.6  Past Medical History:  Diagnosis Date   Arthritis    Bladder cancer (HCC)    Blood clot in vein 2018   left   CAD (coronary artery disease)    a. 06/2014: NSTEMI s/p PTCA of LCx, 60-80% residual in distal LAD, 70% prox small D1.   b. 02/06/15 NSTEMI  s/p successful PCI/DES to mid RCA c. angioplasty alone to LCx in 11/2016   Decreased GFR 45 % per 01-04-2021 labs   Diabetes mellitus, type 2 (HCC)    Elevated PSA    HLD (hyperlipidemia)    HOH (hard of hearing)    Hypertension    NSTEMI (non-ST elevated myocardial infarction) (HCC) 2015   Peripheral vascular disease (HCC)    Poor historian    Wears hearing aid in right ear     Past Surgical History:  Procedure Laterality Date   ABDOMINAL AORTOGRAM W/LOWER EXTREMITY N/A 08/28/2018  Procedure: ABDOMINAL AORTOGRAM W/LOWER EXTREMITY;  Surgeon: Nada Libman, MD;  Location: [facility];  Service: Cardiovascular;  Laterality: N/A;  bilateral   ABDOMINAL AORTOGRAM W/LOWER EXTREMITY N/A 07/09/2019   Procedure: ABDOMINAL AORTOGRAM W/LOWER EXTREMITY;  Surgeon: Nada Libman, MD;  Location: [facility];  Service: Cardiovascular;  Laterality: N/A;  unilateral Lt   ABDOMINAL AORTOGRAM W/LOWER EXTREMITY N/A 06/13/2023   Procedure: ABDOMINAL AORTOGRAM W/LOWER EXTREMITY;  Surgeon: Nada Libman, MD;  Location: [facility];  Service: Cardiovascular;  Laterality: N/A;   CARDIAC CATHETERIZATION  02/06/2015   Procedure: CORONARY STENT INTERVENTION;  Surgeon: Marykay Lex, MD;  [facility] CATH LAB;  Service: Cardiovascular;;  Mid RCA   CARDIAC CATHETERIZATION N/A 11/11/2016   Procedure: Left Heart Cath and Coronary Angiography;  Surgeon: Corky Crafts, MD;  Location: [facility] INVASIVE CV LAB;  Service: Cardiovascular;  Laterality: N/A;   CARDIAC CATHETERIZATION N/A 11/11/2016   Procedure: Coronary Balloon Angioplasty;  Surgeon: Corky Crafts, MD;  Location: [facility] INVASIVE CV LAB;  Service: Cardiovascular;  Laterality: N/A;   COLONOSCOPY N/A 01/14/2019   Procedure: COLONOSCOPY;  Surgeon: Malissa Hippo, MD;  Location: [facility];  Service: Endoscopy;  Laterality: N/A;  730   CORONARY ANGIOPLASTY  11/11/2016   Mid Cx to Dist Cx lesion, 100 %stenosed. This lesion was treated with balloon angioplasty with a 2.0 balloon   CYSTOSCOPY W/ RETROGRADES N/A 05/05/2015   Procedure: CYSTOSCOPY WITH BILATERAL RETROGRADE PYELOGRAMS AND BLADDER BIOPSIES;  Surgeon: Marcine Matar, MD;  Location: [facility];  Service: Urology;  Laterality: N/A;   CYSTOSCOPY W/ RETROGRADES Bilateral 07/31/2018   Procedure: CYSTOSCOPY WITH BILATERAL RETROGRADE PYELOGRAM;  Surgeon: Marcine Matar, MD;  Location: [facility];  Service: Urology;  Laterality: Bilateral;   CYSTOSCOPY W/ RETROGRADES Right 02/15/2021   Procedure: CYSTOSCOPY WITH RETROGRADE PYELOGRAM;  Surgeon: Marcine Matar, MD;  Location: [facility];  Service: Urology;  Laterality: Right;   CYSTOSCOPY WITH BIOPSY N/A 07/31/2018   Procedure: CYSTOSCOPY WITH BIOPSY;  Surgeon: Marcine Matar, MD;  Location: [facility];  Service: Urology;  Laterality: N/A;   CYSTOSCOPY WITH BIOPSY N/A 02/15/2021   Procedure: CYSTOSCOPY WITH BIOPSY;  Surgeon: Marcine Matar, MD;  Location: [facility];  Service: Urology;  Laterality: N/A;   LEFT HEART CATHETERIZATION WITH CORONARY ANGIOGRAM N/A 06/17/2014   Procedure: LEFT HEART CATHETERIZATION WITH CORONARY ANGIOGRAM;  Surgeon: Marykay Lex, MD;  Location: [facility] CATH LAB;  Service: Cardiovascular;  Laterality: N/A;   LEFT HEART CATHETERIZATION WITH CORONARY ANGIOGRAM N/A 02/06/2015   Procedure: LEFT HEART CATHETERIZATION WITH CORONARY ANGIOGRAM;  Surgeon: Marykay Lex, MD;  Location: [facility] CATH LAB;  Service: Cardiovascular;  Laterality: N/A;   PERIPHERAL VASCULAR ATHERECTOMY  08/28/2018   Procedure: PERIPHERAL VASCULAR ATHERECTOMY;  Surgeon: Nada Libman, MD;  Location: [facility];  Service: Cardiovascular;;  Prox lt.SFA, Distal SFA   PERIPHERAL VASCULAR INTERVENTION  07/09/2019   Procedure: PERIPHERAL VASCULAR INTERVENTION;  Surgeon: Nada Libman, MD;  Location: [facility];  Service: Cardiovascular;;  Lt. SFA   PERIPHERAL VASCULAR INTERVENTION  06/13/2023   Procedure: PERIPHERAL VASCULAR INTERVENTION;  Surgeon: Nada Libman, MD;  Location: [facility];  Service: Cardiovascular;;  Lt SFA   POLYPECTOMY  01/14/2019   Procedure: POLYPECTOMY;  Surgeon: Malissa Hippo, MD;  [facility]: [facility];  Service: Endoscopy;;  colon   PROSTATE BIOPSY N/A 03/31/2014   Procedure: BIOPSY TRANSRECTAL ULTRASONIC PROSTATE (  TUBP);  Surgeon: Marcine Matar, MD;  Location: [facility];  Service: Urology;  Laterality: N/A;   PROSTATE BIOPSY N/A 02/15/2021   Procedure: BIOPSY TRANSRECTAL ULTRASONIC PROSTATE (TUBP);  Surgeon: Marcine Matar, MD;  Location: [facility];  Service: Urology;  Laterality: N/A;  45 MINS   TRANSURETHRAL RESECTION OF BLADDER TUMOR N/A 03/14/2013   Procedure: TRANSURETHRAL RESECTION OF  BLADDER TUMOR (TURBT);  Surgeon: Marcine Matar, MD;  Location: [facility];  Service: Urology;  Laterality: N/A;  1 HR WITH MITOMYCIN INSTILLATION    TRANSURETHRAL RESECTION OF BLADDER TUMOR N/A 09/30/2013   Procedure: TRANSURETHRAL RESECTION OF BLADDER TUMOR (TURBT);  Surgeon: Marcine Matar, MD;  Location: [facility];  Service: Urology;  Laterality: N/A;   TRANSURETHRAL RESECTION OF BLADDER TUMOR N/A 03/31/2014   Procedure: TRANSURETHRAL RESECTION OF BLADDER TUMOR (TURBT);  Surgeon: Marcine Matar, MD;  Location: [facility];  Service: Urology;  Laterality: N/A;   TRANSURETHRAL RESECTION OF BLADDER TUMOR N/A 05/31/2015   Procedure: Cystoscopy with clott evacuation and fulgeration of bleeders, retrograde pylegram;  Surgeon: Sebastian Ache, MD;  Location: [facility];  Service: Urology;  Laterality: N/A;    Home Medications:  Allergies as of 03/12/2024       Reactions   Lipitor [atorvastatin] Other (See Comments)   Myalgia   Other Other (See Comments)   All Cholesterol Meds - causes muscle and joint pain   Pravastatin Other (See Comments)   Myalgia        Medication List        Accurate as of March 11, 2024 12:53 PM. If you have any questions, ask your nurse or doctor.          acetaminophen 500 MG tablet Commonly known as: TYLENOL Take 500 mg by mouth every 6 (six) hours as needed (pain).   aspirin 81 MG chewable tablet Chew 1 tablet (81 mg total) by mouth daily.   carvedilol 12.5 MG tablet Commonly known as: COREG TAKE 1 TABLET BY MOUTH TWICE A DAY   cilostazol 100 MG tablet Commonly known as: PLETAL Take 1 tablet (100 mg total) by mouth 2 (two) times daily before a meal.   clopidogrel 75 MG tablet Commonly known as: Plavix Take 1 tablet (75 mg total) by mouth daily.   diclofenac Sodium 1 % Gel Commonly known as: VOLTAREN Apply 2 g topically daily as needed (pain).   empagliflozin 10 MG Tabs tablet Commonly  known as: Jardiance Take 1 tablet (10 mg total) by mouth daily before breakfast.   glimepiride 2 MG tablet Commonly known as: AMARYL Take 1 tablet (2 mg total) by mouth 2 (two) times daily.   losartan 25 MG tablet Commonly known as: COZAAR TAKE 1 TABLET (25 MG TOTAL) BY MOUTH DAILY.   Repatha SureClick 140 MG/ML Soaj Generic drug: Evolocumab INJECT 140 MG INTO THE SKIN EVERY 14 (FOURTEEN) DAYS.   Tradjenta 5 MG Tabs tablet Generic drug: linagliptin Take 1 tablet (5 mg total) by mouth daily.        Allergies:  Allergies  Allergen Reactions   Lipitor [Atorvastatin] Other (See Comments)    Myalgia   Other Other (See Comments)    All Cholesterol Meds - causes muscle and joint pain   Pravastatin Other (See Comments)    Myalgia    Family History  Problem Relation Age of Onset   Cancer Child    Asthma Mother     Social History:  reports that he  quit smoking about 31 years ago. His smoking use included cigarettes. He started smoking about 61 years ago. He has a 30 pack-year smoking history. He has never used smokeless tobacco. He reports that he does not drink alcohol and does not use drugs.  ROS: A complete review of systems was performed.  All systems are negative except for pertinent findings as noted.  Physical Exam:  Vital signs in last 24 hours: There were no vitals taken for this visit. Constitutional:  Alert and oriented, No acute distress Cardiovascular: Regular rate  Respiratory: Normal respiratory effort Neurologic: Grossly intact, no focal deficits Psychiatric: Normal mood and affect  I have reviewed prior pt notes  I have reviewed urinalysis results  I have independently reviewed prior imaging-prostate ultrasound volume  I have reviewed prior PSA and pathology results  I have reviewed prior urine cytology  Cystoscopy Procedure Note:  Indication: ***  After informed consent and discussion of the procedure and its risks, Benjamim Harnish was  positioned and prepped in the standard fashion.  Cystoscopy was performed with a flexible cystoscope.   Findings: Urethra:*** Prostate:*** Bladder neck:*** Ureteral orifices:*** Bladder:***  The patient tolerated the procedure well.           Impression/Assessment:  1.  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  2.  History of high-grade nonmuscle invasive bladder cancer with history of immune therapy, no evidence of recurrence based on cystoscopy today  3.  BPH  Plan:  1.  Cystoscopy covered with Cipro today  2.  Bladder washings sent for cytology  3.  PSA is checked today  4.  I will see back in 6 months for possible cystoscopy

## 2024-03-12 ENCOUNTER — Encounter: Payer: Self-pay | Admitting: Urology

## 2024-03-12 ENCOUNTER — Ambulatory Visit (INDEPENDENT_AMBULATORY_CARE_PROVIDER_SITE_OTHER): Payer: Medicare HMO | Admitting: Urology

## 2024-03-12 VITALS — BP 148/76 | HR 64

## 2024-03-12 DIAGNOSIS — N1831 Chronic kidney disease, stage 3a: Secondary | ICD-10-CM | POA: Diagnosis not present

## 2024-03-12 DIAGNOSIS — Z8551 Personal history of malignant neoplasm of bladder: Secondary | ICD-10-CM

## 2024-03-12 DIAGNOSIS — R350 Frequency of micturition: Secondary | ICD-10-CM

## 2024-03-12 DIAGNOSIS — R3916 Straining to void: Secondary | ICD-10-CM

## 2024-03-12 DIAGNOSIS — N401 Enlarged prostate with lower urinary tract symptoms: Secondary | ICD-10-CM

## 2024-03-12 DIAGNOSIS — E1122 Type 2 diabetes mellitus with diabetic chronic kidney disease: Secondary | ICD-10-CM | POA: Diagnosis not present

## 2024-03-12 DIAGNOSIS — C61 Malignant neoplasm of prostate: Secondary | ICD-10-CM | POA: Diagnosis not present

## 2024-03-12 DIAGNOSIS — R8289 Other abnormal findings on cytological and histological examination of urine: Secondary | ICD-10-CM | POA: Diagnosis not present

## 2024-03-12 LAB — URINALYSIS, ROUTINE W REFLEX MICROSCOPIC
Bilirubin, UA: NEGATIVE
Ketones, UA: NEGATIVE
Leukocytes,UA: NEGATIVE
Nitrite, UA: NEGATIVE
Protein,UA: NEGATIVE
RBC, UA: NEGATIVE
Specific Gravity, UA: 1.015 (ref 1.005–1.030)
Urobilinogen, Ur: 0.2 mg/dL (ref 0.2–1.0)
pH, UA: 6 (ref 5.0–7.5)

## 2024-03-12 MED ORDER — SOLIFENACIN SUCCINATE 10 MG PO TABS
10.0000 mg | ORAL_TABLET | Freq: Every day | ORAL | 11 refills | Status: DC
Start: 2024-03-12 — End: 2024-09-17

## 2024-03-12 NOTE — Progress Notes (Signed)
 Bladder Scan completed today.  Patient can void prior to the bladder scan. Bladder scan result: 34  Performed By: Alfonse Spruce. CMA  Additional notes-

## 2024-03-13 LAB — COMPREHENSIVE METABOLIC PANEL WITH GFR
ALT: 12 IU/L (ref 0–44)
AST: 19 IU/L (ref 0–40)
Albumin: 4.6 g/dL (ref 3.8–4.8)
Alkaline Phosphatase: 73 IU/L (ref 44–121)
BUN/Creatinine Ratio: 14 (ref 10–24)
BUN: 22 mg/dL (ref 8–27)
Bilirubin Total: 0.5 mg/dL (ref 0.0–1.2)
CO2: 21 mmol/L (ref 20–29)
Calcium: 9.4 mg/dL (ref 8.6–10.2)
Chloride: 105 mmol/L (ref 96–106)
Creatinine, Ser: 1.55 mg/dL — ABNORMAL HIGH (ref 0.76–1.27)
Globulin, Total: 2.5 g/dL (ref 1.5–4.5)
Glucose: 176 mg/dL — ABNORMAL HIGH (ref 70–99)
Potassium: 5.1 mmol/L (ref 3.5–5.2)
Sodium: 143 mmol/L (ref 134–144)
Total Protein: 7.1 g/dL (ref 6.0–8.5)
eGFR: 46 mL/min/{1.73_m2} — ABNORMAL LOW (ref >59)

## 2024-03-13 LAB — CYTOLOGY, URINE

## 2024-03-13 LAB — PSA: Prostate Specific Ag, Serum: 2.3 ng/mL (ref 0.0–4.0)

## 2024-03-19 ENCOUNTER — Telehealth: Payer: Self-pay | Admitting: *Deleted

## 2024-03-19 ENCOUNTER — Telehealth: Payer: Self-pay

## 2024-03-19 ENCOUNTER — Ambulatory Visit (INDEPENDENT_AMBULATORY_CARE_PROVIDER_SITE_OTHER): Admitting: Gastroenterology

## 2024-03-19 ENCOUNTER — Encounter: Payer: Self-pay | Admitting: Gastroenterology

## 2024-03-19 VITALS — BP 160/88 | HR 72 | Temp 97.8°F | Ht 68.0 in | Wt 281.2 lb

## 2024-03-19 DIAGNOSIS — R1030 Lower abdominal pain, unspecified: Secondary | ICD-10-CM

## 2024-03-19 DIAGNOSIS — R160 Hepatomegaly, not elsewhere classified: Secondary | ICD-10-CM

## 2024-03-19 DIAGNOSIS — Z860101 Personal history of adenomatous and serrated colon polyps: Secondary | ICD-10-CM

## 2024-03-19 DIAGNOSIS — Z8719 Personal history of other diseases of the digestive system: Secondary | ICD-10-CM

## 2024-03-19 DIAGNOSIS — Z860100 Personal history of colon polyps, unspecified: Secondary | ICD-10-CM

## 2024-03-19 NOTE — Telephone Encounter (Signed)
 LMOVM to call back to give US  appt details for 4/28, arrival 8:15am, npo midnight

## 2024-03-19 NOTE — Telephone Encounter (Signed)
  Request for patient to stop medication prior to procedure or is needing cleareance  What type of surgery is being performed? COLONOSCOPY  When is surgery scheduled? TBD  What type of clearance is required (medical or pharmacy to hold medication or both? Medication  Are there any medications that need to be held prior to surgery and how long? PLAVIX X 5 DAYS  Name of physician performing surgery?  Dr. Samantha Cress [facility]  Phone: [phone] Fax: [phone]  Anethesia type (none, local, MAC, general)? MAC

## 2024-03-19 NOTE — Patient Instructions (Signed)
 I recommend taking Benefiber each morning.  We are arranging a colonoscopy with Dr. Sammi Crick in the near future due to your history of colon polyps  I have also ordered an ultrasound of your liver due to your history of a fatty liver.  You will not take Jardiance for 72 hours prior to the colonoscopy. No amaryl the day of the colonoscopy.  We will see you in 3 months!  It was a pleasure to see you today. I want to create trusting relationships with patients and provide genuine, compassionate, and quality care. I truly value your feedback, so please be on the lookout for a survey regarding your visit with me today. I appreciate your time in completing this!         Delman Ferns, PhD, ANP-BC [facility]

## 2024-03-19 NOTE — Telephone Encounter (Signed)
-----   Message from Malcolm Scrivener Dahlstedt sent at 03/19/2024 12:09 PM EDT ----- These call patient.  Great news, PSA down to 2.3.  His urine cytology did not show cancer cells, follow-up as planned. ----- Message ----- From: Garner [facility] Lab Results In Sent: 03/12/2024   3:36 PM EDT To: Trent Frizzle, MD

## 2024-03-19 NOTE — Telephone Encounter (Signed)
 Called pt to give results from MD Dahlstedt pt voiced his understanding

## 2024-03-19 NOTE — Progress Notes (Addendum)
 Gastroenterology Office Note    Referring Provider: Minus Amel, MD Primary Care Physician:  Dr. Glady Laming Primary GI: Dr. Sammi Crick     Chief Complaint   Chief Complaint  Patient presents with   Abdominal Pain     History of Present Illness   Timothy [name] is a 78 y.o. male presenting today at the request of Dr. Glady Laming due to abdominal pain. He is quite pleasant but a difficult/limited historian. Pertinent medical history including bladder cancer, DM, CAD with stent, HLD, HTN, NSTEMI, PVD. History of adenomas with need for 5 year surveillance.   Pt reports lower abdominal pain in July 2024 and was prescribed antibiotics. No imaging at that time. No abdominal pain currently. States he feels like it happens every summer. Pain will be acute onset, some greens and beans have possibly led to onset per patient. Will take pepto or alka seltzer to help. Antibiotics helped resolved pain in July.   Not able to quantify how often this discomfort happens. Feels like a hunger pain and better after eating. No weight loss. Good appetite. No rectal bleeding. Will skip a day sometimes with BM and then says will "catch up". If skips a day, may be looser the next day. Butter beans will cause loose stool. Sometimes stool will "blow out". Sometimes goes back to soft BM.    Colonoscopy 2020: two 5 mm polyps in ascending colon and cecum, two small polyps in cecum, diverticulosis, 5 year surveillance, tubular adenomas  No FH colon cancer   Past Medical History:  Diagnosis Date   Arthritis    Bladder cancer (HCC)    Blood clot in vein 2018   left   CAD (coronary artery disease)    a. 06/2014: NSTEMI s/p PTCA of LCx, 60-80% residual in distal LAD, 70% prox small D1.   b. 02/06/15 NSTEMI  s/p successful PCI/DES to mid RCA c. angioplasty alone to LCx in 11/2016   Decreased GFR 45 % per 01-04-2021 labs   Diabetes mellitus, type 2 (HCC)    Elevated PSA    HLD (hyperlipidemia)    HOH (hard  of hearing)    Hypertension    NSTEMI (non-ST elevated myocardial infarction) (HCC) 2015   Peripheral vascular disease (HCC)    Poor historian    Wears hearing aid in right ear     Past Surgical History:  Procedure Laterality Date   ABDOMINAL AORTOGRAM W/LOWER EXTREMITY N/A 08/28/2018   Procedure: ABDOMINAL AORTOGRAM W/LOWER EXTREMITY;  Surgeon: Margherita Shell, MD;  [facility]: [facility];  Service: Cardiovascular;  Laterality: N/A;  bilateral   ABDOMINAL AORTOGRAM W/LOWER EXTREMITY N/A 07/09/2019   Procedure: ABDOMINAL AORTOGRAM W/LOWER EXTREMITY;  Surgeon: Margherita Shell, MD;  Location: [facility];  Service: Cardiovascular;  Laterality: N/A;  unilateral Lt   ABDOMINAL AORTOGRAM W/LOWER EXTREMITY N/A 06/13/2023   Procedure: ABDOMINAL AORTOGRAM W/LOWER EXTREMITY;  Surgeon: Margherita Shell, MD;  Location: [facility];  Service: Cardiovascular;  Laterality: N/A;   CARDIAC CATHETERIZATION  02/06/2015   Procedure: CORONARY STENT INTERVENTION;  Surgeon: Arleen Lacer, MD;  Location: [facility] CATH LAB;  Service: Cardiovascular;;  Mid RCA   CARDIAC CATHETERIZATION N/A 11/11/2016   Procedure: Left Heart Cath and Coronary Angiography;  Surgeon: Lucendia Rusk, MD;  Location: [facility]IVE CV LAB;  Service: Cardiovascular;  Laterality: N/A;   CARDIAC CATHETERIZATION N/A 11/11/2016   Procedure: Coronary Balloon Angioplasty;  Surgeon: Lucendia Rusk, MD;  Location:  [facility];  Service: Cardiovascular;  Laterality: N/A;   COLONOSCOPY N/A 01/14/2019   Procedure: COLONOSCOPY;  Surgeon: Ruby Corporal, MD;  Location: [facility];  Service: Endoscopy;  Laterality: N/A;  730   CORONARY ANGIOPLASTY  11/11/2016   Mid Cx to Dist Cx lesion, 100 %stenosed. This lesion was treated with balloon angioplasty with a 2.0 balloon   CYSTOSCOPY W/ RETROGRADES N/A 05/05/2015   Procedure: CYSTOSCOPY WITH BILATERAL RETROGRADE PYELOGRAMS AND BLADDER BIOPSIES;  Surgeon: Trent Frizzle, MD;   Location: [facility];  Service: Urology;  Laterality: N/A;   CYSTOSCOPY W/ RETROGRADES Bilateral 07/31/2018   Procedure: CYSTOSCOPY WITH BILATERAL RETROGRADE PYELOGRAM;  Surgeon: Trent Frizzle, MD;  Location: [facility];  Service: Urology;  Laterality: Bilateral;   CYSTOSCOPY W/ RETROGRADES Right 02/15/2021   Procedure: CYSTOSCOPY WITH RETROGRADE PYELOGRAM;  Surgeon: Trent Frizzle, MD;  Location: [facility];  Service: Urology;  Laterality: Right;   CYSTOSCOPY WITH BIOPSY N/A 07/31/2018   Procedure: CYSTOSCOPY WITH BIOPSY;  Surgeon: Trent Frizzle, MD;  Location: [facility];  Service: Urology;  Laterality: N/A;   CYSTOSCOPY WITH BIOPSY N/A 02/15/2021   Procedure: CYSTOSCOPY WITH BIOPSY;  Surgeon: Trent Frizzle, MD;  Location: [facility];  Service: Urology;  Laterality: N/A;   LEFT HEART CATHETERIZATION WITH CORONARY ANGIOGRAM N/A 06/17/2014   Procedure: LEFT HEART CATHETERIZATION WITH CORONARY ANGIOGRAM;  Surgeon: Arleen Lacer, MD;  Location: [facility] CATH LAB;  Service: Cardiovascular;  Laterality: N/A;   LEFT HEART CATHETERIZATION WITH CORONARY ANGIOGRAM N/A 02/06/2015   Procedure: LEFT HEART CATHETERIZATION WITH CORONARY ANGIOGRAM;  Surgeon: Arleen Lacer, MD;  Location: [facility] CATH LAB;  Service: Cardiovascular;  Laterality: N/A;   PERIPHERAL VASCULAR ATHERECTOMY  08/28/2018   Procedure: PERIPHERAL VASCULAR ATHERECTOMY;  Surgeon: Margherita Shell, MD;  [facility]: [facility];  Service: Cardiovascular;;  Prox lt.SFA, Distal SFA   PERIPHERAL VASCULAR INTERVENTION  07/09/2019   Procedure: PERIPHERAL VASCULAR INTERVENTION;  Surgeon: Margherita Shell, MD;  [facility]: [facility];  Service: Cardiovascular;;  Lt. SFA   PERIPHERAL VASCULAR INTERVENTION  06/13/2023   Procedure: PERIPHERAL VASCULAR INTERVENTION;  Surgeon: Margherita Shell, MD;  [facility]: [facility];  Service: Cardiovascular;;  Lt SFA   POLYPECTOMY  01/14/2019   Procedure: POLYPECTOMY;  Surgeon:  Ruby Corporal, MD;  [facility];  Service: Endoscopy;;  colon   PROSTATE BIOPSY N/A 03/31/2014   Procedure: BIOPSY TRANSRECTAL ULTRASONIC PROSTATE (TUBP);  Surgeon: Trent Frizzle, MD;  Location: [facility];  Service: Urology;  Laterality: N/A;   PROSTATE BIOPSY N/A 02/15/2021   Procedure: BIOPSY TRANSRECTAL ULTRASONIC PROSTATE (TUBP);  Surgeon: Trent Frizzle, MD;  Location: [facility];  Service: Urology;  Laterality: N/A;  45 MINS   TRANSURETHRAL RESECTION OF BLADDER TUMOR N/A 03/14/2013   Procedure: TRANSURETHRAL RESECTION OF BLADDER TUMOR (TURBT);  Surgeon: Trent Frizzle, MD;  Location: [facility];  Service: Urology;  Laterality: N/A;  1 HR WITH MITOMYCIN  INSTILLATION    TRANSURETHRAL RESECTION OF BLADDER TUMOR N/A 09/30/2013   Procedure: TRANSURETHRAL RESECTION OF BLADDER TUMOR (TURBT);  Surgeon: Trent Frizzle, MD;  Location: [facility];  Service: Urology;  Laterality: N/A;   TRANSURETHRAL RESECTION OF BLADDER TUMOR N/A 03/31/2014   Procedure: TRANSURETHRAL RESECTION OF BLADDER TUMOR (TURBT);  Surgeon: Trent Frizzle, MD;  Location: [facility];  Service: Urology;  Laterality: N/A;   TRANSURETHRAL RESECTION OF BLADDER TUMOR N/A 05/31/2015   Procedure:  Cystoscopy with clott evacuation and fulgeration of bleeders, retrograde pylegram;  Surgeon: Osborn Blaze, MD;  Location: [facility];  Service: Urology;  Laterality: N/A;    Current Outpatient Medications  Medication Sig Dispense Refill   acetaminophen  (TYLENOL ) 500 MG tablet Take 500 mg by mouth every 6 (six) hours as needed (pain).     aspirin  81 MG chewable tablet Chew 1 tablet (81 mg total) by mouth daily.     carvedilol  (COREG ) 12.5 MG tablet TAKE 1 TABLET BY MOUTH TWICE A DAY 180 tablet 3   cilostazol  (PLETAL ) 100 MG tablet Take 1 tablet (100 mg total) by mouth 2 (two) times daily before a meal. 60 tablet 11   clopidogrel  (PLAVIX )  75 MG tablet Take 1 tablet (75 mg total) by mouth daily. 30 tablet 11   diclofenac Sodium (VOLTAREN) 1 % GEL Apply 2 g topically daily as needed (pain).     empagliflozin  (JARDIANCE ) 10 MG TABS tablet Take 1 tablet (10 mg total) by mouth daily before breakfast. 90 tablet 3   Evolocumab  (REPATHA  SURECLICK) 140 MG/ML SOAJ INJECT 140 MG INTO THE SKIN EVERY 14 (FOURTEEN) DAYS. 6 mL 3   glimepiride  (AMARYL ) 2 MG tablet Take 1 tablet (2 mg total) by mouth 2 (two) times daily. 180 tablet 3   losartan  (COZAAR ) 25 MG tablet TAKE 1 TABLET (25 MG TOTAL) BY MOUTH DAILY. 90 tablet 3   solifenacin  (VESICARE ) 10 MG tablet Take 1 tablet (10 mg total) by mouth daily. 30 tablet 11   TRADJENTA  5 MG TABS tablet Take 1 tablet (5 mg total) by mouth daily. 90 tablet 3   No current facility-administered medications for this visit.    Allergies as of 03/19/2024 - Review Complete 03/19/2024  Allergen Reaction Noted   Lipitor [atorvastatin ] Other (See Comments) 08/07/2014   Other Other (See Comments) 05/28/2015   Pravastatin  Other (See Comments) 08/07/2014    Family History  Problem Relation Age of Onset   Cancer Child    Asthma Mother     Social History   Socioeconomic History   Marital status: Married    Spouse name: Not on file   Number of children: Not on file   Years of education: Not on file   Highest education level: Not on file  Occupational History   Not on file  Tobacco Use   Smoking status: Former    Current packs/day: 0.00    Average packs/day: 1 pack/day for 30.0 years (30.0 ttl pk-yrs)    Types: Cigarettes    Start date: 03/12/1963    Quit date: 03/11/1993    Years since quitting: 31.0   Smokeless tobacco: Never  Vaping Use   Vaping status: Never Used  Substance and Sexual Activity   Alcohol  use: No    Alcohol /week: 0.0 standard drinks of alcohol    Drug use: No   Sexual activity: Not Currently  Other Topics Concern   Not on file  Social History Narrative   Not on file   Social  Drivers of Health   Financial Resource Strain: Low Risk  (09/26/2023)   Overall Financial Resource Strain (CARDIA)    Difficulty of Paying Living Expenses: Not very hard  Food Insecurity: No Food Insecurity (09/11/2023)   Hunger Vital Sign    Worried About Running Out of Food in the Last Year: Never true    Ran Out of Food in the Last Year: Never true  Transportation Needs: No Transportation Needs (09/26/2023)   PRAPARE - Transportation  Lack of Transportation (Medical): No    Lack of Transportation (Non-Medical): No  Physical Activity: Sufficiently Active (09/11/2023)   Exercise Vital Sign    Days of Exercise per Week: 5 days    Minutes of Exercise per Session: 30 min  Stress: Not on file  Social Connections: Not on file  Intimate Partner Violence: Not on file     Review of Systems   Gen: Denies any fever, chills, fatigue, weight loss, lack of appetite.  CV: Denies chest pain, heart palpitations, peripheral edema, syncope.  Resp: Denies shortness of breath at rest or with exertion. Denies wheezing or cough.  GI: Denies dysphagia or odynophagia. Denies jaundice, hematemesis, fecal incontinence. GU : Denies urinary burning, urinary frequency, urinary hesitancy MS: Denies joint pain, muscle weakness, cramps, or limitation of movement.  Derm: Denies rash, itching, dry skin Psych: Denies depression, anxiety, memory loss, and confusion Heme: Denies bruising, bleeding, and enlarged lymph nodes.   Physical Exam   BP (!) 160/88 (BP Location: Right Arm, Patient Position: Sitting, Cuff Size: Large)   Pulse 72   Temp 97.8 F (36.6 C) (Oral)   Ht 5\' 8"  (1.727 m)   Wt 281 lb 3.2 oz (127.6 kg)   SpO2 96%   BMI 42.76 kg/m  General:   Alert and oriented. Pleasant and cooperative. Well-nourished and well-developed.  Head:  Normocephalic and atraumatic. Eyes:  Without icterus Ears:  Normal auditory acuity. Lungs:  Clear to auscultation bilaterally.  Heart:  S1, S2 present without  murmurs appreciated.  Abdomen:  +BS, soft, non-tender and non-distended. Possible hepatomegaly Rectal:  Deferred  Msk:  Symmetrical without gross deformities. Normal posture. Extremities:  Without edema. Neurologic:  Alert and  oriented x4;  grossly normal neurologically. Skin:  Intact without significant lesions or rashes. Psych:  Alert and cooperative. Normal mood and affect.   Assessment   Timothy [name] is a 78 y.o. male presenting today at the request of Dr. Glady Laming due to abdominal pain. He is quite pleasant but a difficult/limited historian. Pertinent medical history including bladder cancer, DM, CAD with stent, HLD, HTN, NSTEMI, PVD. History of adenomas with need for 5 year surveillance.   Lower abdominal pain: unclear etiology and is infrequent in episodes. Unable to rule out mild constipation contributing, could have had mild diverticulitis episode in light of known diverticulosis, as he did note improvement in antibiotics last July 2024. Currently, his exam is benign. Recommend Benefiber daily.   Hx of adenomas in 2020 with need for surveillance. Will arrange colonoscopy in the near future.   Possible hepatomegaly: and hx of hepatic steatosis. Update US  abdomen complete. May be simply body habitus.   Requesting to hold Plavix  X 5 days prior.    PLAN   Proceed with colonoscopy by Dr. Dorris Gaul  in near future: the risks, benefits, and alternatives have been discussed with the patient in detail. The patient states understanding and desires to proceed.   Requested clearance to hold Plavix  5 days prior: clearance received and scanned into media dated 4/16  Benefiber daily  US  abdomen completed due to ?HSM: This showed hepatic steatosis. Normal spleen. Can discuss at follow-up  3 month follow-up  Delman Ferns, PhD, ANP-BC [facility] Gastroenterology   I have reviewed the note and agree with the APP's assessment as described in this progress note  Samantha Cress,  MD Gastroenterology and Hepatology [facility] Gastroenterology

## 2024-03-20 DIAGNOSIS — H52223 Regular astigmatism, bilateral: Secondary | ICD-10-CM | POA: Diagnosis not present

## 2024-03-20 DIAGNOSIS — H5203 Hypermetropia, bilateral: Secondary | ICD-10-CM | POA: Diagnosis not present

## 2024-03-20 DIAGNOSIS — E119 Type 2 diabetes mellitus without complications: Secondary | ICD-10-CM | POA: Diagnosis not present

## 2024-03-20 NOTE — Telephone Encounter (Signed)
Clearance received and scanned into media.

## 2024-03-20 NOTE — Telephone Encounter (Signed)
 Spoke with spouse and she will give appt details

## 2024-04-01 ENCOUNTER — Ambulatory Visit (HOSPITAL_COMMUNITY)
Admission: RE | Admit: 2024-04-01 | Discharge: 2024-04-01 | Disposition: A | Discharge status: Home / Self Care | Source: Ambulatory Visit | Attending: Gastroenterology | Admitting: Gastroenterology

## 2024-04-01 DIAGNOSIS — R160 Hepatomegaly, not elsewhere classified: Secondary | ICD-10-CM | POA: Diagnosis not present

## 2024-04-03 NOTE — Telephone Encounter (Signed)
 Noted. May pursue surveillance colonoscopy by Dr. Sammi Crick. ASA 3. Medication adjustments on AVS. May hold Plavix  X 5 days prior.

## 2024-04-03 NOTE — Telephone Encounter (Signed)
 Noted will have to wait for June schedule

## 2024-04-04 ENCOUNTER — Other Ambulatory Visit: Payer: Self-pay | Admitting: Nurse Practitioner

## 2024-04-04 MED ORDER — DAPAGLIFLOZIN PROPANEDIOL 5 MG PO TABS: 5.0000 mg | ORAL_TABLET | Freq: Every day | ORAL | 3 refills | Status: DC

## 2024-04-04 NOTE — Progress Notes (Signed)
 Patient stopped by, says Jardiance  is not covered, but Farxiga  was listed as a preferred alternative.  Sent in Farxiga  5 mg to his pharmacy.

## 2024-04-10 MED ORDER — PEG 3350-KCL-NA BICARB-NACL 420 G PO SOLR: 4000.0000 mL | Freq: Once | ORAL | 0 refills | Status: AC

## 2024-04-10 NOTE — Telephone Encounter (Signed)
 Pt called back. He has been scheduled with Dr. Sammi Crick 6/3 at 9am. Aware will send instructions to him, will call back from pre-op appointment, rx for prep sent to pharmacy. Patient aware of medications to hold.

## 2024-04-10 NOTE — Telephone Encounter (Signed)
 LM to call back with spouse

## 2024-04-10 NOTE — Addendum Note (Signed)
 Addended by: Feliz Hosteller on: 04/10/2024 04:21 PM   Modules accepted: Orders

## 2024-04-11 NOTE — Telephone Encounter (Signed)
 Called pt and spoke with him and his spouse. They are aware of the pre-op appt details.

## 2024-05-01 NOTE — Telephone Encounter (Signed)
 I spoke with pt. He stated he has not been taking his Pletal . Didn't feel like it helped. Couldn't remember the last time he did take it. I have made Dr. Sammi Crick aware

## 2024-05-01 NOTE — Pre-Procedure Instructions (Signed)
  RE: pletal  Received: Today Estudillo, Kandee Orion, CMA  Daesha Insco V, RN; Rozann Cornell, LPN; Alvester Johnson, LPN; Umberto Ganong, Bearl Limes, MD Antony Baumgartner only informed us  to hold the plavix , not pletal . Our providers at [facility] don't hold pletal .       Previous Messages

## 2024-05-02 NOTE — Pre-Procedure Instructions (Signed)
   RE: pletal  Received: Silvester Drown, CMA  Urban Garden, MD; Venancio Gibney, RN; Rozann Cornell, LPN; Alvester Johnson, LPN I spoke with pt and he stated he was not taking his Pletal  currently. When I asked him when was the last time, he couldn't remember.       Previous Messages    ----- Message ----- From: Urban Garden, MD Sent: 05/01/2024   3:34 PM EDT To: Alvester Johnson, LPN; Feliz Hosteller, CMA; * Subject: RE: pletal                                     Mindy, Can we try reaching Dr. Syliva Even office and ask if this can be held ASAP?  He may start by skipping his dose tomorrow and should be ok for procedure. Thanks ----- Message ----- From: Feliz Hosteller, CMA Sent: 05/01/2024   3:28 PM EDT To: Alvester Johnson, LPN; Venancio Gibney, RN; * Subject: RE: pletal                                     Antony Baumgartner only informed us  to hold the plavix , not pletal . Our providers at [facility] don't hold pletal . ----- Message ----- From: Venancio Gibney, RN Sent: 05/01/2024   3:15 PM EDT To: Alvester Johnson, LPN; Feliz Hosteller, CMA; * Subject: pletal                                         Good afternoon! I see the clearance for Andras Grunewald to hold his plavix . Does he need to hold his pletal  as well? If so, do we have clearance for that? I may have not seen it.

## 2024-05-03 ENCOUNTER — Other Ambulatory Visit: Payer: Self-pay

## 2024-05-03 ENCOUNTER — Encounter (HOSPITAL_COMMUNITY)
Admission: RE | Admit: 2024-05-03 | Discharge: 2024-05-03 | Disposition: A | Discharge status: Home / Self Care | Source: Ambulatory Visit | Attending: Gastroenterology | Admitting: Gastroenterology

## 2024-05-03 ENCOUNTER — Encounter (HOSPITAL_COMMUNITY): Payer: Self-pay

## 2024-05-07 ENCOUNTER — Ambulatory Visit (HOSPITAL_COMMUNITY): Admitting: Anesthesiology

## 2024-05-07 ENCOUNTER — Other Ambulatory Visit: Payer: Self-pay

## 2024-05-07 ENCOUNTER — Ambulatory Visit (HOSPITAL_COMMUNITY)
Admission: RE | Admit: 2024-05-07 | Discharge: 2024-05-07 | Disposition: A | Discharge status: Home / Self Care | Attending: Gastroenterology | Admitting: Gastroenterology

## 2024-05-07 ENCOUNTER — Encounter (INDEPENDENT_AMBULATORY_CARE_PROVIDER_SITE_OTHER): Payer: Self-pay | Admitting: *Deleted

## 2024-05-07 ENCOUNTER — Encounter (HOSPITAL_COMMUNITY)
Admission: RE | Disposition: A | Discharge status: Home / Self Care | Payer: Self-pay | Source: Home / Self Care | Attending: Gastroenterology

## 2024-05-07 ENCOUNTER — Encounter (HOSPITAL_COMMUNITY): Payer: Self-pay | Admitting: Gastroenterology

## 2024-05-07 DIAGNOSIS — Z800 Family history of malignant neoplasm of digestive organs: Secondary | ICD-10-CM | POA: Insufficient documentation

## 2024-05-07 DIAGNOSIS — D123 Benign neoplasm of transverse colon: Secondary | ICD-10-CM | POA: Diagnosis not present

## 2024-05-07 DIAGNOSIS — Z1211 Encounter for screening for malignant neoplasm of colon: Secondary | ICD-10-CM | POA: Diagnosis not present

## 2024-05-07 DIAGNOSIS — Z955 Presence of coronary angioplasty implant and graft: Secondary | ICD-10-CM | POA: Insufficient documentation

## 2024-05-07 DIAGNOSIS — K648 Other hemorrhoids: Secondary | ICD-10-CM | POA: Diagnosis not present

## 2024-05-07 DIAGNOSIS — D122 Benign neoplasm of ascending colon: Secondary | ICD-10-CM | POA: Insufficient documentation

## 2024-05-07 DIAGNOSIS — Z87891 Personal history of nicotine dependence: Secondary | ICD-10-CM | POA: Insufficient documentation

## 2024-05-07 DIAGNOSIS — Z8551 Personal history of malignant neoplasm of bladder: Secondary | ICD-10-CM | POA: Insufficient documentation

## 2024-05-07 DIAGNOSIS — E119 Type 2 diabetes mellitus without complications: Secondary | ICD-10-CM | POA: Insufficient documentation

## 2024-05-07 DIAGNOSIS — I10 Essential (primary) hypertension: Secondary | ICD-10-CM | POA: Diagnosis not present

## 2024-05-07 DIAGNOSIS — Z860100 Personal history of colon polyps, unspecified: Secondary | ICD-10-CM

## 2024-05-07 DIAGNOSIS — Z7984 Long term (current) use of oral hypoglycemic drugs: Secondary | ICD-10-CM | POA: Diagnosis not present

## 2024-05-07 DIAGNOSIS — I2510 Atherosclerotic heart disease of native coronary artery without angina pectoris: Secondary | ICD-10-CM | POA: Insufficient documentation

## 2024-05-07 DIAGNOSIS — K5730 Diverticulosis of large intestine without perforation or abscess without bleeding: Secondary | ICD-10-CM

## 2024-05-07 DIAGNOSIS — K635 Polyp of colon: Secondary | ICD-10-CM | POA: Diagnosis not present

## 2024-05-07 DIAGNOSIS — E785 Hyperlipidemia, unspecified: Secondary | ICD-10-CM | POA: Insufficient documentation

## 2024-05-07 DIAGNOSIS — I252 Old myocardial infarction: Secondary | ICD-10-CM | POA: Insufficient documentation

## 2024-05-07 HISTORY — PX: COLONOSCOPY: SHX5424

## 2024-05-07 LAB — HM COLONOSCOPY

## 2024-05-07 LAB — GLUCOSE, CAPILLARY: Glucose-Capillary: 126 mg/dL — ABNORMAL HIGH (ref 70–99)

## 2024-05-07 SURGERY — COLONOSCOPY
Anesthesia: General

## 2024-05-07 MED ORDER — PROPOFOL 10 MG/ML IV BOLUS
INTRAVENOUS | Status: DC | PRN
  Administered 2024-05-07: 100 mg via INTRAVENOUS

## 2024-05-07 MED ORDER — PROPOFOL 500 MG/50ML IV EMUL
INTRAVENOUS | Status: DC | PRN
  Administered 2024-05-07: 150 ug/kg/min via INTRAVENOUS

## 2024-05-07 MED ORDER — LIDOCAINE 2% (20 MG/ML) 5 ML SYRINGE
INTRAMUSCULAR | Status: DC | PRN
Start: 2024-05-07 — End: 2024-05-07
  Administered 2024-05-07: 100 mg via INTRAVENOUS

## 2024-05-07 MED ORDER — PHENYLEPHRINE 80 MCG/ML (10ML) SYRINGE FOR IV PUSH (FOR BLOOD PRESSURE SUPPORT)
PREFILLED_SYRINGE | INTRAVENOUS | Status: DC | PRN
  Administered 2024-05-07: 80 ug via INTRAVENOUS

## 2024-05-07 MED ORDER — LACTATED RINGERS IV SOLN: INTRAVENOUS | Status: DC | PRN

## 2024-05-07 NOTE — Transfer of Care (Signed)
 Immediate Anesthesia Transfer of Care Note  Patient: Timothy [name]  Procedure(s) Performed: COLONOSCOPY  Patient Location: Endoscopy Unit  Anesthesia Type:General  Level of Consciousness: awake, drowsy, and patient cooperative  Airway & Oxygen Therapy: Patient Spontanous Breathing  Post-op Assessment: Report given to RN, Post -op Vital signs reviewed and stable, and Patient moving all extremities X 4  Post vital signs: Reviewed and stable  Last Vitals:  Vitals Value Taken Time  BP 161/79 0905  Temp 98.7 0905  Pulse 72 0905  Resp 12 0905  SpO2 99 0905    Last Pain:  Vitals:   05/07/24 0830  TempSrc:   PainSc: 0-No pain         Complications: No notable events documented.

## 2024-05-07 NOTE — Op Note (Signed)
 [facility] Patient Name: Timothy [name] Procedure Date: 05/07/2024 8:20 AM MRN: [id number] Date of Birth: 14-Jul-1946 Attending MD: Samantha Cress , , [id number] CSN: [id number] Age: 78 Admit Type: Outpatient Procedure:                Colonoscopy Indications:              Surveillance: Personal history of adenomatous                            polyps on last colonoscopy 5 years ago Providers:                Samantha Cress, Alisa App, [place] Wilson,                            Technician, Theola Fitch Referring MD:              Medicines:                Monitored Anesthesia Care Complications:            No immediate complications. Estimated Blood Loss:     Estimated blood loss: none. Procedure:                Pre-Anesthesia Assessment:                           - Prior to the procedure, a History and Physical                            was performed, and patient medications, allergies                            and sensitivities were reviewed. The patient's                            tolerance of previous anesthesia was reviewed.                           - The risks and benefits of the procedure and the                            sedation options and risks were discussed with the                            patient. All questions were answered and informed                            consent was obtained.                           - ASA Grade Assessment: III - A patient with severe                            systemic disease.                           After obtaining informed consent, the colonoscope  was passed under direct vision. Throughout the                            procedure, the patient's blood pressure, pulse, and                            oxygen saturations were monitored continuously. The                            [id number]-HQ190L ([id number]) scope was introduced through                            the anus and advanced to the the cecum,  identified                            by appendiceal orifice and ileocecal valve. The                            colonoscopy was performed without difficulty. The                            patient tolerated the procedure well. The quality                            of the bowel preparation was adequate. Scope In: 8:36:26 AM Scope Out: 8:57:25 AM Scope Withdrawal Time: 0 hours 13 minutes 47 seconds  Total Procedure Duration: 0 hours 20 minutes 59 seconds  Findings:      The perianal and digital rectal examinations were normal.      Three sessile polyps were found in the transverse colon and ascending       colon. The polyps were 3 to 5 mm in size. These polyps were removed with       a cold snare. Resection and retrieval were complete.      Scattered small-mouthed diverticula were found in the sigmoid colon.      Non-bleeding internal hemorrhoids were found during retroflexion. The       hemorrhoids were medium-sized. Impression:               - Three 3 to 5 mm polyps in the transverse colon                            and in the ascending colon, removed with a cold                            snare. Resected and retrieved.                           - Diverticulosis in the sigmoid colon.                           - Non-bleeding internal hemorrhoids. Moderate Sedation:      Per Anesthesia Care Recommendation:           - Discharge patient to home (ambulatory).                           -  Resume previous diet.                           - Await pathology results.                           - Repeat colonoscopy date to be determined after                            pending pathology results are reviewed for                            surveillance. Procedure Code(s):        --- Professional ---                           [phone], Colonoscopy, flexible; with removal of                            tumor(s), polyp(s), or other lesion(s) by snare                            technique Diagnosis  Code(s):        --- Professional ---                           Z86.010, Personal history of colonic polyps                           D12.3, Benign neoplasm of transverse colon (hepatic                            flexure or splenic flexure)                           D12.2, Benign neoplasm of ascending colon                           K64.8, Other hemorrhoids                           K57.30, Diverticulosis of large intestine without                            perforation or abscess without bleeding CPT copyright 2022 [employer]. All rights reserved. The codes documented in this report are preliminary and upon coder review may  be revised to meet current compliance requirements. Samantha Cress, MD Samantha Cress,  05/07/2024 9:01:53 AM This report has been signed electronically. Number of Addenda: 0

## 2024-05-07 NOTE — Anesthesia Preprocedure Evaluation (Signed)
 Anesthesia Evaluation  Patient identified by MRN, date of birth, ID band Patient awake    Reviewed: Allergy & Precautions, H&P , NPO status , Patient's Chart, lab work & pertinent test results, reviewed documented beta blocker date and time   Airway Mallampati: II  TM Distance: >3 FB Neck ROM: full    Dental no notable dental hx.    Pulmonary neg pulmonary ROS, former smoker   Pulmonary exam normal breath sounds clear to auscultation       Cardiovascular Exercise Tolerance: Good hypertension, negative cardio ROS  Rhythm:regular Rate:Normal     Neuro/Psych negative neurological ROS  negative psych ROS   GI/Hepatic negative GI ROS, Neg liver ROS,,,  Endo/Other  negative endocrine ROSdiabetes    Renal/GU negative Renal ROS  negative genitourinary   Musculoskeletal   Abdominal   Peds  Hematology negative hematology ROS (+)   Anesthesia Other Findings   Reproductive/Obstetrics negative OB ROS                             Anesthesia Physical Anesthesia Plan  ASA: 3  Anesthesia Plan: General   Post-op Pain Management:    Induction:   PONV Risk Score and Plan: Propofol infusion  Airway Management Planned:   Additional Equipment:   Intra-op Plan:   Post-operative Plan:   Informed Consent: I have reviewed the patients History and Physical, chart, labs and discussed the procedure including the risks, benefits and alternatives for the proposed anesthesia with the patient or authorized representative who has indicated his/her understanding and acceptance.     Dental Advisory Given  Plan Discussed with: CRNA  Anesthesia Plan Comments:        Anesthesia Quick Evaluation

## 2024-05-07 NOTE — H&P (Signed)
 Timothy [name] is an 78 y.o. male.   Chief Complaint: history of tubular adenomas HPI: 78 year old male with past medical history bladder cancer, coronary artery disease status post PCI, diabetes, hyperlipidemia, hypertension, history of NSTEMI, coming for history of colon polyps.  Last colonoscopy performed in 2020, had 4 tubular adenomas.  The patient denies having any complaints such as melena, hematochezia, abdominal pain or distention, change in her bowel movement consistency or frequency, no changes in weight recently.  Had 2 brothers in their mid 38s and early 60s that had colon cancer.   Past Medical History:  Diagnosis Date   Arthritis    Bladder cancer (HCC)    Blood clot in vein 2018   left   CAD (coronary artery disease)    a. 06/2014: NSTEMI s/p PTCA of LCx, 60-80% residual in distal LAD, 70% prox small D1.   b. 02/06/15 NSTEMI  s/p successful PCI/DES to mid RCA c. angioplasty alone to LCx in 11/2016   Decreased GFR 45 % per 01-04-2021 labs   Diabetes mellitus, type 2 (HCC)    Elevated PSA    HLD (hyperlipidemia)    HOH (hard of hearing)    Hypertension    NSTEMI (non-ST elevated myocardial infarction) (HCC) 2015   Peripheral vascular disease (HCC)    Poor historian    Wears hearing aid in right ear     Past Surgical History:  Procedure Laterality Date   ABDOMINAL AORTOGRAM W/LOWER EXTREMITY N/A 08/28/2018   Procedure: ABDOMINAL AORTOGRAM W/LOWER EXTREMITY;  Surgeon: Margherita Shell, MD;  Location: [facility];  Service: Cardiovascular;  Laterality: N/A;  bilateral   ABDOMINAL AORTOGRAM W/LOWER EXTREMITY N/A 07/09/2019   Procedure: ABDOMINAL AORTOGRAM W/LOWER EXTREMITY;  Surgeon: Margherita Shell, MD;  Location: [facility];  Service: Cardiovascular;  Laterality: N/A;  unilateral Lt   ABDOMINAL AORTOGRAM W/LOWER EXTREMITY N/A 06/13/2023   Procedure: ABDOMINAL AORTOGRAM W/LOWER EXTREMITY;  Surgeon: Margherita Shell, MD;  Location: [facility];  Service:  Cardiovascular;  Laterality: N/A;   CARDIAC CATHETERIZATION  02/06/2015   Procedure: CORONARY STENT INTERVENTION;  Surgeon: Arleen Lacer, MD;  Location: [facility] CATH LAB;  Service: Cardiovascular;;  Mid RCA   CARDIAC CATHETERIZATION N/A 11/11/2016   Procedure: Left Heart Cath and Coronary Angiography;  Surgeon: Lucendia Rusk, MD;  Location: [facility] INVASIVE CV LAB;  Service: Cardiovascular;  Laterality: N/A;   CARDIAC CATHETERIZATION N/A 11/11/2016   Procedure: Coronary Balloon Angioplasty;  Surgeon: Lucendia Rusk, MD;  Location: [facility] INVASIVE CV LAB;  Service: Cardiovascular;  Laterality: N/A;   COLONOSCOPY N/A 01/14/2019   Procedure: COLONOSCOPY;  Surgeon: Ruby Corporal, MD;  Location: [facility];  Service: Endoscopy;  Laterality: N/A;  730   CORONARY ANGIOPLASTY  11/11/2016   Mid Cx to Dist Cx lesion, 100 %stenosed. This lesion was treated with balloon angioplasty with a 2.0 balloon   CYSTOSCOPY W/ RETROGRADES N/A 05/05/2015   Procedure: CYSTOSCOPY WITH BILATERAL RETROGRADE PYELOGRAMS AND BLADDER BIOPSIES;  Surgeon: Trent Frizzle, MD;  Location: [facility];  Service: Urology;  Laterality: N/A;   CYSTOSCOPY W/ RETROGRADES Bilateral 07/31/2018   Procedure: CYSTOSCOPY WITH BILATERAL RETROGRADE PYELOGRAM;  Surgeon: Trent Frizzle, MD;  Location: [facility];  Service: Urology;  Laterality: Bilateral;   CYSTOSCOPY W/ RETROGRADES Right 02/15/2021   Procedure: CYSTOSCOPY WITH RETROGRADE PYELOGRAM;  Surgeon: Trent Frizzle, MD;  Location: [facility];  Service: Urology;  Laterality: Right;   CYSTOSCOPY WITH BIOPSY N/A 07/31/2018  Procedure: CYSTOSCOPY WITH BIOPSY;  Surgeon: Trent Frizzle, MD;  Location: [facility];  Service: Urology;  Laterality: N/A;   CYSTOSCOPY WITH BIOPSY N/A 02/15/2021   Procedure: CYSTOSCOPY WITH BIOPSY;  Surgeon: Trent Frizzle, MD;  Location: [facility];  Service: Urology;  Laterality: N/A;   LEFT HEART CATHETERIZATION WITH CORONARY  ANGIOGRAM N/A 06/17/2014   Procedure: LEFT HEART CATHETERIZATION WITH CORONARY ANGIOGRAM;  Surgeon: Arleen Lacer, MD;  Location: [facility] CATH LAB;  Service: Cardiovascular;  Laterality: N/A;   LEFT HEART CATHETERIZATION WITH CORONARY ANGIOGRAM N/A 02/06/2015   Procedure: LEFT HEART CATHETERIZATION WITH CORONARY ANGIOGRAM;  Surgeon: Arleen Lacer, MD;  Location: [facility] CATH LAB;  Service: Cardiovascular;  Laterality: N/A;   PERIPHERAL VASCULAR ATHERECTOMY  08/28/2018   Procedure: PERIPHERAL VASCULAR ATHERECTOMY;  Surgeon: Margherita Shell, MD;  Location: [facility];  Service: Cardiovascular;;  Prox lt.SFA, Distal SFA   PERIPHERAL VASCULAR INTERVENTION  07/09/2019   Procedure: PERIPHERAL VASCULAR INTERVENTION;  Surgeon: Margherita Shell, MD;  Location: [facility];  Service: Cardiovascular;;  Lt. SFA   PERIPHERAL VASCULAR INTERVENTION  06/13/2023   Procedure: PERIPHERAL VASCULAR INTERVENTION;  Surgeon: Margherita Shell, MD;  Location: [facility];  Service: Cardiovascular;;  Lt SFA   POLYPECTOMY  01/14/2019   Procedure: POLYPECTOMY;  Surgeon: Ruby Corporal, MD;  [facility];  Service: Endoscopy;;  colon   PROSTATE BIOPSY N/A 03/31/2014   Procedure: BIOPSY TRANSRECTAL ULTRASONIC PROSTATE (TUBP);  Surgeon: Trent Frizzle, MD;  Location: [facility];  Service: Urology;  Laterality: N/A;   PROSTATE BIOPSY N/A 02/15/2021   Procedure: BIOPSY TRANSRECTAL ULTRASONIC PROSTATE (TUBP);  Surgeon: Trent Frizzle, MD;  Location: [facility];  Service: Urology;  Laterality: N/A;  45 MINS   TRANSURETHRAL RESECTION OF BLADDER TUMOR N/A 03/14/2013   Procedure: TRANSURETHRAL RESECTION OF BLADDER TUMOR (TURBT);  Surgeon: Trent Frizzle, MD;  Location: [facility];  Service: Urology;  Laterality: N/A;  1 HR WITH MITOMYCIN  INSTILLATION    TRANSURETHRAL RESECTION OF BLADDER TUMOR N/A 09/30/2013   Procedure: TRANSURETHRAL RESECTION OF BLADDER  TUMOR (TURBT);  Surgeon: Trent Frizzle, MD;  Location: [facility];  Service: Urology;  Laterality: N/A;   TRANSURETHRAL RESECTION OF BLADDER TUMOR N/A 03/31/2014   Procedure: TRANSURETHRAL RESECTION OF BLADDER TUMOR (TURBT);  Surgeon: Trent Frizzle, MD;  Location: [facility];  Service: Urology;  Laterality: N/A;   TRANSURETHRAL RESECTION OF BLADDER TUMOR N/A 05/31/2015   Procedure: Cystoscopy with clott evacuation and fulgeration of bleeders, retrograde pylegram;  Surgeon: Osborn Blaze, MD;  Location: [facility];  Service: Urology;  Laterality: N/A;    Family History  Problem Relation Age of Onset   Asthma Mother    Cancer Child    Colon cancer Neg Hx    Social History:  reports that he quit smoking about 31 years ago. His smoking use included cigarettes. He started smoking about 61 years ago. He has a 30 pack-year smoking history. He has never used smokeless tobacco. He reports that he does not drink alcohol  and does not use drugs.  Allergies:  Allergies  Allergen Reactions   Lipitor [Atorvastatin ] Other (See Comments)    Myalgia   Other Other (See Comments)    All Cholesterol Meds - causes muscle and joint pain   Pravastatin  Other (See Comments)    Myalgia    Medications Prior to Admission  Medication Sig Dispense Refill   acetaminophen  (TYLENOL ) 500  MG tablet Take 500 mg by mouth every 6 (six) hours as needed (pain).     aspirin  81 MG chewable tablet Chew 1 tablet (81 mg total) by mouth daily.     carvedilol  (COREG ) 12.5 MG tablet TAKE 1 TABLET BY MOUTH TWICE A DAY 180 tablet 3   cilostazol  (PLETAL ) 100 MG tablet Take 1 tablet (100 mg total) by mouth 2 (two) times daily before a meal. 60 tablet 11   clopidogrel  (PLAVIX ) 75 MG tablet Take 1 tablet (75 mg total) by mouth daily. 30 tablet 11   diclofenac Sodium (VOLTAREN) 1 % GEL Apply 2 g topically daily as needed (pain).     Evolocumab  (REPATHA  SURECLICK) 140 MG/ML SOAJ INJECT 140 MG INTO THE  SKIN EVERY 14 (FOURTEEN) DAYS. 6 mL 3   glimepiride  (AMARYL ) 2 MG tablet Take 1 tablet (2 mg total) by mouth 2 (two) times daily. 180 tablet 3   losartan  (COZAAR ) 25 MG tablet TAKE 1 TABLET (25 MG TOTAL) BY MOUTH DAILY. 90 tablet 3   solifenacin  (VESICARE ) 10 MG tablet Take 1 tablet (10 mg total) by mouth daily. 30 tablet 11   TRADJENTA  5 MG TABS tablet Take 1 tablet (5 mg total) by mouth daily. 90 tablet 3   dapagliflozin  propanediol (FARXIGA ) 5 MG TABS tablet Take 1 tablet (5 mg total) by mouth daily before breakfast. 30 tablet 3    Results for orders placed or performed during the hospital encounter of 05/07/24 (from the past 48 hours)  Glucose, capillary     Status: Abnormal   Collection Time: 05/07/24  7:27 AM  Result Value Ref Range   Glucose-Capillary 126 (H) 70 - 99 mg/dL    Comment: Glucose reference range applies only to samples taken after fasting for at least 8 hours.   No results found.  Review of Systems  All other systems reviewed and are negative.   Blood pressure (!) 169/77, pulse (!) 57, temperature (!) 97.1 F (36.2 C), temperature source Axillary, resp. rate 14, height 5\' 8"  (1.727 m), weight 127.6 kg, SpO2 100%. Physical Exam  GENERAL: The patient is AO x3, in no acute distress. HEENT: Head is normocephalic and atraumatic. EOMI are intact. Mouth is well hydrated and without lesions. NECK: Supple. No masses LUNGS: Clear to auscultation. No presence of rhonchi/wheezing/rales. Adequate chest expansion HEART: RRR, normal s1 and s2. ABDOMEN: Soft, nontender, no guarding, no peritoneal signs, and nondistended. BS +. No masses. EXTREMITIES: Without any cyanosis, clubbing, rash, lesions or edema. NEUROLOGIC: AOx3, no focal motor deficit. SKIN: no jaundice, no rashes  Assessment/Plan  78 year old male with past medical history bladder cancer, coronary artery disease status post PCI, diabetes, hyperlipidemia, hypertension, history of NSTEMI, coming for history of colon  polyps.  Will proceed with colonoscopy.  Urban Garden, MD 05/07/2024, 7:37 AM

## 2024-05-07 NOTE — Discharge Instructions (Signed)
 You are being discharged to home.  Resume your previous diet.  We are waiting for your pathology results.  Your physician has recommended a repeat colonoscopy (date to be determined after pending pathology results are reviewed) for surveillance.

## 2024-05-08 ENCOUNTER — Encounter (HOSPITAL_COMMUNITY): Payer: Self-pay | Admitting: Gastroenterology

## 2024-05-08 ENCOUNTER — Ambulatory Visit (INDEPENDENT_AMBULATORY_CARE_PROVIDER_SITE_OTHER): Payer: Self-pay | Admitting: Gastroenterology

## 2024-05-08 LAB — SURGICAL PATHOLOGY

## 2024-05-09 NOTE — Anesthesia Postprocedure Evaluation (Signed)
 Anesthesia Post Note  Patient: Timothy [name]  Procedure(s) Performed: COLONOSCOPY  Patient location during evaluation: Phase II Anesthesia Type: General Level of consciousness: awake Pain management: pain level controlled Vital Signs Assessment: post-procedure vital signs reviewed and stable Respiratory status: spontaneous breathing and respiratory function stable Cardiovascular status: blood pressure returned to baseline and stable Postop Assessment: no headache and no apparent nausea or vomiting Anesthetic complications: no Comments: Late entry   No notable events documented.   Last Vitals:  Vitals:   05/07/24 0723 05/07/24 0904  BP: (!) 169/77 (!) 161/61  Pulse: (!) 57 63  Resp: 14 13  Temp: (!) 36.2 C (!) 36.3 C  SpO2: 100% 97%    Last Pain:  Vitals:   05/07/24 0904  TempSrc: Axillary  PainSc: Asleep                 Coretha Dew

## 2024-05-16 ENCOUNTER — Ambulatory Visit: Payer: Medicare HMO | Admitting: Nurse Practitioner

## 2024-05-27 ENCOUNTER — Ambulatory Visit: Admitting: Nurse Practitioner

## 2024-05-27 DIAGNOSIS — Z7984 Long term (current) use of oral hypoglycemic drugs: Secondary | ICD-10-CM

## 2024-05-27 DIAGNOSIS — E1122 Type 2 diabetes mellitus with diabetic chronic kidney disease: Secondary | ICD-10-CM

## 2024-05-27 DIAGNOSIS — E782 Mixed hyperlipidemia: Secondary | ICD-10-CM

## 2024-05-27 DIAGNOSIS — I10 Essential (primary) hypertension: Secondary | ICD-10-CM

## 2024-05-27 DIAGNOSIS — N1831 Chronic kidney disease, stage 3a: Secondary | ICD-10-CM

## 2024-05-31 DIAGNOSIS — Z6833 Body mass index (BMI) 33.0-33.9, adult: Secondary | ICD-10-CM | POA: Diagnosis not present

## 2024-05-31 DIAGNOSIS — E6609 Other obesity due to excess calories: Secondary | ICD-10-CM | POA: Diagnosis not present

## 2024-05-31 DIAGNOSIS — E1140 Type 2 diabetes mellitus with diabetic neuropathy, unspecified: Secondary | ICD-10-CM | POA: Diagnosis not present

## 2024-06-04 ENCOUNTER — Encounter: Payer: Self-pay | Admitting: Nurse Practitioner

## 2024-06-04 ENCOUNTER — Ambulatory Visit (INDEPENDENT_AMBULATORY_CARE_PROVIDER_SITE_OTHER): Admitting: Nurse Practitioner

## 2024-06-04 VITALS — BP 110/70 | HR 71 | Ht 68.0 in | Wt 215.8 lb

## 2024-06-04 DIAGNOSIS — E1122 Type 2 diabetes mellitus with diabetic chronic kidney disease: Secondary | ICD-10-CM

## 2024-06-04 DIAGNOSIS — Z7984 Long term (current) use of oral hypoglycemic drugs: Secondary | ICD-10-CM | POA: Diagnosis not present

## 2024-06-04 DIAGNOSIS — N1831 Chronic kidney disease, stage 3a: Secondary | ICD-10-CM

## 2024-06-04 DIAGNOSIS — E782 Mixed hyperlipidemia: Secondary | ICD-10-CM | POA: Diagnosis not present

## 2024-06-04 DIAGNOSIS — I10 Essential (primary) hypertension: Secondary | ICD-10-CM

## 2024-06-04 LAB — POCT GLYCOSYLATED HEMOGLOBIN (HGB A1C)

## 2024-06-04 MED ORDER — GLIMEPIRIDE 2 MG PO TABS: 2.0000 mg | ORAL_TABLET | Freq: Two times a day (BID) | ORAL | 3 refills | Status: AC

## 2024-06-04 MED ORDER — DAPAGLIFLOZIN PROPANEDIOL 5 MG PO TABS: 5.0000 mg | ORAL_TABLET | Freq: Every day | ORAL | 3 refills | Status: AC

## 2024-06-04 MED ORDER — TRADJENTA 5 MG PO TABS: 5.0000 mg | ORAL_TABLET | Freq: Every day | ORAL | 3 refills | Status: AC

## 2024-06-04 NOTE — Progress Notes (Signed)
 Endocrinology Follow Up Note       06/04/2024, 9:03 AM   Subjective:    Patient ID: Timothy [name], male    DOB: [birth year].  Timothy [name] is being seen in follow up after being seen in consultation for management of currently uncontrolled symptomatic diabetes requested by  Bertell Satterfield, MD.   Past Medical History:  Diagnosis Date   Arthritis    Bladder cancer (HCC)    Blood clot in vein 2018   left   CAD (coronary artery disease)    a. 06/2014: NSTEMI s/p PTCA of LCx, 60-80% residual in distal LAD, 70% prox small D1.   b. 02/06/15 NSTEMI  s/p successful PCI/DES to mid RCA c. angioplasty alone to LCx in 11/2016   Decreased GFR 45 % per 01-04-2021 labs   Diabetes mellitus, type 2 (HCC)    Elevated PSA    HLD (hyperlipidemia)    HOH (hard of hearing)    Hypertension    NSTEMI (non-ST elevated myocardial infarction) (HCC) 2015   Peripheral vascular disease (HCC)    Poor historian    Wears hearing aid in right ear     Past Surgical History:  Procedure Laterality Date   ABDOMINAL AORTOGRAM W/LOWER EXTREMITY N/A 08/28/2018   Procedure: ABDOMINAL AORTOGRAM W/LOWER EXTREMITY;  Surgeon: Serene Gaile ORN, MD;  [facility]: [facility];  Service: Cardiovascular;  Laterality: N/A;  bilateral   ABDOMINAL AORTOGRAM W/LOWER EXTREMITY N/A 07/09/2019   Procedure: ABDOMINAL AORTOGRAM W/LOWER EXTREMITY;  Surgeon: Serene Gaile ORN, MD;  Location: [facility];  Service: Cardiovascular;  Laterality: N/A;  unilateral Lt   ABDOMINAL AORTOGRAM W/LOWER EXTREMITY N/A 06/13/2023   Procedure: ABDOMINAL AORTOGRAM W/LOWER EXTREMITY;  Surgeon: Serene Gaile ORN, MD;  Location: [facility];  Service: Cardiovascular;  Laterality: N/A;   CARDIAC CATHETERIZATION  02/06/2015   Procedure: CORONARY STENT INTERVENTION;  Surgeon: Alm ORN Clay, MD;  Location: [facility] CATH LAB;  Service: Cardiovascular;;  Mid RCA   CARDIAC  CATHETERIZATION N/A 11/11/2016   Procedure: Left Heart Cath and Coronary Angiography;  Surgeon: Candyce GORMAN Reek, MD;  Location: [facility] INVASIVE CV LAB;  Service: Cardiovascular;  Laterality: N/A;   CARDIAC CATHETERIZATION N/A 11/11/2016   Procedure: Coronary Balloon Angioplasty;  Surgeon: Candyce GORMAN Reek, MD;  Location: [facility] INVASIVE CV LAB;  Service: Cardiovascular;  Laterality: N/A;   COLONOSCOPY N/A 01/14/2019   Procedure: COLONOSCOPY;  Surgeon: Golda Claudis PENNER, MD;  [facility]: [facility];  Service: Endoscopy;  Laterality: N/A;  730   COLONOSCOPY N/A 05/07/2024   Procedure: COLONOSCOPY;  Surgeon: Eartha Angelia Sieving, MD;  [facility]: [facility];  Service: Gastroenterology;  Laterality: N/A;  900am, asa 3   CORONARY ANGIOPLASTY  11/11/2016   Mid Cx to Dist Cx lesion, 100 %stenosed. This lesion was treated with balloon angioplasty with a 2.0 balloon   CYSTOSCOPY W/ RETROGRADES N/A 05/05/2015   Procedure: CYSTOSCOPY WITH BILATERAL RETROGRADE PYELOGRAMS AND BLADDER BIOPSIES;  Surgeon: Garnette Shack, MD;  Location: [facility];  Service: Urology;  Laterality: N/A;   CYSTOSCOPY W/ RETROGRADES Bilateral 07/31/2018   Procedure: CYSTOSCOPY WITH BILATERAL RETROGRADE PYELOGRAM;  Surgeon: Shack Garnette, MD;  Location: [facility];  Service:  Urology;  Laterality: Bilateral;   CYSTOSCOPY W/ RETROGRADES Right 02/15/2021   Procedure: CYSTOSCOPY WITH RETROGRADE PYELOGRAM;  Surgeon: Matilda Senior, MD;  Location: [facility];  Service: Urology;  Laterality: Right;   CYSTOSCOPY WITH BIOPSY N/A 07/31/2018   Procedure: CYSTOSCOPY WITH BIOPSY;  Surgeon: Matilda Senior, MD;  Location: [facility];  Service: Urology;  Laterality: N/A;   CYSTOSCOPY WITH BIOPSY N/A 02/15/2021   Procedure: CYSTOSCOPY WITH BIOPSY;  Surgeon: Matilda Senior, MD;  Location: [facility];  Service: Urology;  Laterality: N/A;   LEFT HEART CATHETERIZATION WITH CORONARY ANGIOGRAM N/A 06/17/2014   Procedure:  LEFT HEART CATHETERIZATION WITH CORONARY ANGIOGRAM;  Surgeon: Alm LELON Clay, MD;  Location: [facility] CATH LAB;  Service: Cardiovascular;  Laterality: N/A;   LEFT HEART CATHETERIZATION WITH CORONARY ANGIOGRAM N/A 02/06/2015   Procedure: LEFT HEART CATHETERIZATION WITH CORONARY ANGIOGRAM;  Surgeon: Alm LELON Clay, MD;  Location: [facility] CATH LAB;  Service: Cardiovascular;  Laterality: N/A;   PERIPHERAL VASCULAR ATHERECTOMY  08/28/2018   Procedure: PERIPHERAL VASCULAR ATHERECTOMY;  Surgeon: Serene Gaile LELON, MD;  [facility];  Service: Cardiovascular;;  Prox lt.SFA, Distal SFA   PERIPHERAL VASCULAR INTERVENTION  07/09/2019   Procedure: PERIPHERAL VASCULAR INTERVENTION;  Surgeon: Serene Gaile LELON, MD;  [facility]: [facility];  Service: Cardiovascular;;  Lt. SFA   PERIPHERAL VASCULAR INTERVENTION  06/13/2023   Procedure: PERIPHERAL VASCULAR INTERVENTION;  Surgeon: Serene Gaile LELON, MD;  [facility]: [facility];  Service: Cardiovascular;;  Lt SFA   POLYPECTOMY  01/14/2019   Procedure: POLYPECTOMY;  Surgeon: Golda Claudis PENNER, MD;  [facility];  Service: Endoscopy;;  colon   PROSTATE BIOPSY N/A 03/31/2014   Procedure: BIOPSY TRANSRECTAL ULTRASONIC PROSTATE (TUBP);  Surgeon: Senior Matilda, MD;  Location: [facility];  Service: Urology;  Laterality: N/A;   PROSTATE BIOPSY N/A 02/15/2021   Procedure: BIOPSY TRANSRECTAL ULTRASONIC PROSTATE (TUBP);  Surgeon: Matilda Senior, MD;  Location: [facility];  Service: Urology;  Laterality: N/A;  45 MINS   TRANSURETHRAL RESECTION OF BLADDER TUMOR N/A 03/14/2013   Procedure: TRANSURETHRAL RESECTION OF BLADDER TUMOR (TURBT);  Surgeon: Senior Matilda, MD;  Location: [facility];  Service: Urology;  Laterality: N/A;  1 HR WITH MITOMYCIN  INSTILLATION    TRANSURETHRAL RESECTION OF BLADDER TUMOR N/A 09/30/2013   Procedure: TRANSURETHRAL RESECTION OF BLADDER TUMOR (TURBT);  Surgeon: Senior Matilda, MD;  Location: [facility];  Service: Urology;  Laterality: N/A;   TRANSURETHRAL RESECTION OF BLADDER TUMOR N/A 03/31/2014   Procedure: TRANSURETHRAL RESECTION OF BLADDER TUMOR (TURBT);  Surgeon: Senior Matilda, MD;  Location: [facility];  Service: Urology;  Laterality: N/A;   TRANSURETHRAL RESECTION OF BLADDER TUMOR N/A 05/31/2015   Procedure: Cystoscopy with clott evacuation and fulgeration of bleeders, retrograde pylegram;  Surgeon: Ricardo Likens, MD;  Location: [facility];  Service: Urology;  Laterality: N/A;    Social History   Socioeconomic History   Marital status: Married    Spouse name: Not on file   Number of children: Not on file   Years of education: Not on file   Highest education level: Not on file  Occupational History   Not on file  Tobacco Use   Smoking status: Former    Current packs/day: 0.00    Average packs/day: 1 pack/day for 30.0 years (30.0 ttl pk-yrs)    Types: Cigarettes    Start date: 03/12/1963    Quit date: 03/11/1993  Years since quitting: 31.2   Smokeless tobacco: Never  Vaping Use   Vaping status: Never Used  Substance and Sexual Activity   Alcohol  use: No    Alcohol /week: 0.0 standard drinks of alcohol    Drug use: No   Sexual activity: Not Currently  Other Topics Concern   Not on file  Social History Narrative   Not on file   Social Drivers of Health   Financial Resource Strain: Low Risk  (09/26/2023)   Overall Financial Resource Strain (CARDIA)    Difficulty of Paying Living Expenses: Not very hard  Food Insecurity: No Food Insecurity (09/11/2023)   Hunger Vital Sign    Worried About Running Out of Food in the Last Year: Never true    Ran Out of Food in the Last Year: Never true  Transportation Needs: No Transportation Needs (09/26/2023)   PRAPARE - Administrator, Civil Service (Medical): No    Lack of Transportation (Non-Medical): No  Physical Activity: Sufficiently Active  (09/11/2023)   Exercise Vital Sign    Days of Exercise per Week: 5 days    Minutes of Exercise per Session: 30 min  Stress: Not on file  Social Connections: Not on file    Family History  Problem Relation Age of Onset   Asthma Mother    Cancer Child    Colon cancer Neg Hx     Outpatient Encounter Medications as of 06/04/2024  Medication Sig   acetaminophen  (TYLENOL ) 500 MG tablet Take 500 mg by mouth every 6 (six) hours as needed (pain).   aspirin  81 MG chewable tablet Chew 1 tablet (81 mg total) by mouth daily.   carvedilol  (COREG ) 12.5 MG tablet TAKE 1 TABLET BY MOUTH TWICE A DAY   cilostazol  (PLETAL ) 100 MG tablet Take 1 tablet (100 mg total) by mouth 2 (two) times daily before a meal.   clopidogrel  (PLAVIX ) 75 MG tablet Take 1 tablet (75 mg total) by mouth daily.   diclofenac Sodium (VOLTAREN) 1 % GEL Apply 2 g topically daily as needed (pain).   Evolocumab  (REPATHA  SURECLICK) 140 MG/ML SOAJ INJECT 140 MG INTO THE SKIN EVERY 14 (FOURTEEN) DAYS.   losartan  (COZAAR ) 25 MG tablet TAKE 1 TABLET (25 MG TOTAL) BY MOUTH DAILY.   solifenacin  (VESICARE ) 10 MG tablet Take 1 tablet (10 mg total) by mouth daily.   [DISCONTINUED] dapagliflozin  propanediol (FARXIGA ) 5 MG TABS tablet Take 1 tablet (5 mg total) by mouth daily before breakfast.   [DISCONTINUED] glimepiride  (AMARYL ) 2 MG tablet Take 1 tablet (2 mg total) by mouth 2 (two) times daily.   [DISCONTINUED] TRADJENTA  5 MG TABS tablet Take 1 tablet (5 mg total) by mouth daily.   dapagliflozin  propanediol (FARXIGA ) 5 MG TABS tablet Take 1 tablet (5 mg total) by mouth daily before breakfast.   glimepiride  (AMARYL ) 2 MG tablet Take 1 tablet (2 mg total) by mouth 2 (two) times daily.   TRADJENTA  5 MG TABS tablet Take 1 tablet (5 mg total) by mouth daily.   No facility-administered encounter medications on file as of 06/04/2024.    ALLERGIES: Allergies  Allergen Reactions   Lipitor [Atorvastatin ] Other (See Comments)    Myalgia   Other  Other (See Comments)    All Cholesterol Meds - causes muscle and joint pain   Pravastatin  Other (See Comments)    Myalgia    VACCINATION STATUS: Immunization History  Administered Date(s) Administered   Influenza, High Dose Seasonal PF 08/29/2017, 08/16/2018   Influenza-Unspecified 09/08/2014  Diabetes He presents for his follow-up diabetic visit. He has type 2 diabetes mellitus. Onset time: diagnosed at approx age of 25. His disease course has been improving. There are no hypoglycemic associated symptoms. Associated symptoms include polyuria (on Jardiance ). There are no hypoglycemic complications. Symptoms are improving. Diabetic complications include nephropathy. Risk factors for coronary artery disease include diabetes mellitus, dyslipidemia, family history, male sex, obesity, hypertension and sedentary lifestyle. Current diabetic treatment includes oral agent (triple therapy). He is compliant with treatment most of the time. His weight is fluctuating minimally. He is following a generally unhealthy diet. When asked about meal planning, he reported none. He has not had a previous visit with a dietitian. He rarely participates in exercise. His home blood glucose trend is fluctuating minimally. His overall blood glucose range is 140-180 mg/dl. (He presents today with his meter showing stable, at target glycemic profile overall.  His POCT A1c today is 7.6%, improving from last visit of 7.8%.  Analysis of his meter shows 7-day average of 154 (3 readings), 14-day average of 151 (4 readings), 30-day average of 152 (6 readings).  He notes he generally feels pretty good, tolerating the Farxiga  well.) An ACE inhibitor/angiotensin II receptor blocker is not being taken. He sees a podiatrist.Eye exam is current.     Review of systems  Constitutional: + Minimally fluctuating body weight,  current Body mass index is 32.81 kg/m. , no fatigue, no subjective hyperthermia, no subjective hypothermia Eyes:  no blurry vision, no xerophthalmia ENT: no sore throat, no nodules palpated in throat, no dysphagia/odynophagia, no hoarseness Cardiovascular: no chest pain, no shortness of breath, no palpitations, no leg swelling Respiratory: no cough, no shortness of breath Gastrointestinal: no nausea/vomiting/diarrhea Musculoskeletal: no muscle/joint aches Skin: no rashes, no hyperemia Neurological: no tremors, + numbness/ tingling to BLE- worse at night and in AM, no dizziness Psychiatric: no depression, no anxiety  Objective:     BP 110/70 (BP Location: Left Arm, Patient Position: Sitting, Cuff Size: Large)   Pulse 71   Ht 5' 8 (1.727 m)   Wt 215 lb 12.8 oz (97.9 kg)   BMI 32.81 kg/m   Wt Readings from Last 3 Encounters:  06/04/24 215 lb 12.8 oz (97.9 kg)  05/07/24 281 lb 4.9 oz (127.6 kg)  03/19/24 281 lb 3.2 oz (127.6 kg)     BP Readings from Last 3 Encounters:  06/04/24 110/70  05/07/24 (!) 161/61  03/19/24 (!) 160/88     Physical Exam- Limited  Constitutional:  Body mass index is 32.81 kg/m. , not in acute distress, normal state of mind, HOH wears bilateral hearing aids Eyes:  EOMI, no exophthalmos Musculoskeletal: no gross deformities, strength intact in all four extremities, no gross restriction of joint movements Skin:  no rashes, no hyperemia Neurological: no tremor with outstretched hands   Diabetic Foot Exam - Simple   No data filed     CMP ( most recent) CMP     Component Value Date/Time   NA 143 03/12/2024 1221   K 5.1 03/12/2024 1221   CL 105 03/12/2024 1221   CO2 21 03/12/2024 1221   GLUCOSE 176 (H) 03/12/2024 1221   GLUCOSE 201 (H) 06/13/2023 0859   BUN 22 03/12/2024 1221   CREATININE 1.55 (H) 03/12/2024 1221   CREATININE 1.43 (H) 02/25/2020 1148   CALCIUM  9.4 03/12/2024 1221   PROT 7.1 03/12/2024 1221   ALBUMIN 4.6 03/12/2024 1221   AST 19 03/12/2024 1221   ALT 12 03/12/2024 1221   ALKPHOS  73 03/12/2024 1221   BILITOT 0.5 03/12/2024 1221    GFRNONAA 45 (L) 01/04/2021 0953   GFRAA 47 (L) 07/09/2019 0801     Diabetic Labs (most recent): Lab Results  Component Value Date   HGBA1C 7.8 (A) 01/16/2024   HGBA1C 7.3 (A) 10/09/2023   HGBA1C 8.0 (A) 07/04/2023   MICROALBUR 80 10/09/2023   MICROALBUR 30 mg'l 10/10/2022     Lipid Panel ( most recent) Lipid Panel     Component Value Date/Time   CHOL 138 11/12/2016 0101   TRIG 372 (A) 08/23/2022 0000   HDL 25 (L) 11/12/2016 0101   CHOLHDL 5.5 11/12/2016 0101   VLDL 39 11/12/2016 0101   LDLCALC 115 08/23/2022 0000      Lab Results  Component Value Date   TSH 1.17 08/23/2022   TSH 1.805 02/06/2015           Assessment & Plan:   1) Type 2 diabetes mellitus with stage 3a chronic kidney disease, without long-term current use of insulin  (HCC)  He presents today with his meter showing stable, at target glycemic profile overall.  His POCT A1c today is 7.6%, improving from last visit of 7.8%.  Analysis of his meter shows 7-day average of 154 (3 readings), 14-day average of 151 (4 readings), 30-day average of 152 (6 readings).  He notes he generally feels pretty good, tolerating the Farxiga  well.  - Timothy [name] has currently uncontrolled symptomatic type 2 DM since 78 years of age.   -Recent labs reviewed.    - I had a long discussion with him about the progressive nature of diabetes and the pathology behind its complications. -his diabetes is complicated by mild CKD and he remains at a high risk for more acute and chronic complications which include CAD, CVA, CKD, retinopathy, and neuropathy. These are all discussed in detail with him.  The following Lifestyle Medicine recommendations according to American College of Lifestyle Medicine [facility]) were discussed and offered to patient and he agrees to start the journey:  A. Whole Foods, Plant-based plate comprising of fruits and vegetables, plant-based proteins, whole-grain carbohydrates was discussed in detail with the  patient.   A list for source of those nutrients were also provided to the patient.  Patient will use only water  or unsweetened tea for hydration. B.  The need to stay away from risky substances including alcohol , smoking; obtaining 7 to 9 hours of restorative sleep, at least 150 minutes of moderate intensity exercise weekly, the importance of healthy social connections,  and stress reduction techniques were discussed. C.  A full color page of  Calorie density of various food groups per pound showing examples of each food groups was provided to the patient.  - Nutritional counseling repeated at each appointment due to patients tendency to fall back in to old habits.  - The patient admits there is a room for improvement in their diet and drink choices. -  Suggestion is made for the patient to avoid simple carbohydrates from their diet including Cakes, Sweet Desserts / Pastries, Ice Cream, Soda (diet and regular), Sweet Tea, Candies, Chips, Cookies, Sweet Pastries, Store Bought Juices, Alcohol  in Excess of 1-2 drinks a day, Artificial Sweeteners, Coffee Creamer, and Sugar-free Products. This will help patient to have stable blood glucose profile and potentially avoid unintended weight gain.   - I encouraged the patient to switch to unprocessed or minimally processed complex starch and increased protein intake (animal or plant source), fruits, and vegetables.   -  Patient is advised to stick to a routine mealtimes to eat 3 meals a day and avoid unnecessary snacks (to snack only to correct hypoglycemia).  - I have approached him with the following individualized plan to manage his diabetes and patient agrees:   - he is advised to continue Glimepiride  2 mg po twice daily with meals, therapeutically suitable for patient, Tradjenta  5 mg po daily, and Farxiga  5 mg po daily.   -he is encouraged to start monitoring glucose twice daily, before breakfast and before bed, and to call the clinic if he has readings  less than 70 or above 300 for 3 tests in a row.  - Adjustment parameters are given to him for hypo and hyperglycemia in writing. - he is encouraged to call clinic for blood glucose levels less than 70 or above 300 mg /dl.  - Specific targets for  A1c; LDL, HDL, and Triglycerides were discussed with the patient.  2) Blood Pressure /Hypertension:  his blood pressure is controlled to target.   he is advised to continue his current medications including Hydralazine  75 mg p.o. TID and Metoprolol  25 mg po daily.  3) Lipids/Hyperlipidemia:    Review of his recent lipid panel from 08/24/23 showed controlled LDL at 40 and elevated triglycerides of 273 (improving).  He is statin intolerant says his PCP started him on Repatha .    4)  Weight/Diet:  his Body mass index is 32.81 kg/m.  -  clearly complicating his diabetes care.   he is a candidate for weight loss. I discussed with him the fact that loss of 5 - 10% of his  current body weight will have the most impact on his diabetes management.  Exercise, and detailed carbohydrates information provided  -  detailed on discharge instructions.  5) Chronic Care/Health Maintenance: -he is not on ACEI/ARB and is intolerant to Statin medications and is encouraged to initiate and continue to follow up with Ophthalmology, Dentist, Podiatrist at least yearly or according to recommendations, and advised to stay away from smoking. I have recommended yearly flu vaccine and pneumonia vaccine at least every 5 years; moderate intensity exercise for up to 150 minutes weekly; and sleep for at least 7 hours a day.  - he is advised to maintain close follow up with Bertell Satterfield, MD for primary care needs, as well as his other providers for optimal and coordinated care.       I spent  32  minutes in the care of the patient today including review of labs from CMP, Lipids, Thyroid  Function, Hematology (current and previous including abstractions from other facilities);  face-to-face time discussing  his blood glucose readings/logs, discussing hypoglycemia and hyperglycemia episodes and symptoms, medications doses, his options of short and long term treatment based on the latest standards of care / guidelines;  discussion about incorporating lifestyle medicine;  and documenting the encounter. Risk reduction counseling performed per USPSTF guidelines to reduce obesity and cardiovascular risk factors.     Please refer to Patient Instructions for Blood Glucose Monitoring and Insulin /Medications Dosing Guide  in media tab for additional information. Please  also refer to  Patient Self Inventory in the Media  tab for reviewed elements of pertinent patient history.  Timothy [name] participated in the discussions, expressed understanding, and voiced agreement with the above plans.  All questions were answered to his satisfaction. he is encouraged to contact clinic should he have any questions or concerns prior to his return visit.     Follow up  plan: - Return in about 6 months (around 12/05/2024) for Diabetes F/U with A1c in office, No previsit labs, Bring meter and logs.  Timothy [name], [facility] [place] [facility] [address] [place], [place] [address] Phone: [phone] Fax: [phone]  06/04/2024, 9:03 AM

## 2024-07-24 DIAGNOSIS — E1100 Type 2 diabetes mellitus with hyperosmolarity without nonketotic hyperglycemic-hyperosmolar coma (NKHHC): Secondary | ICD-10-CM | POA: Diagnosis not present

## 2024-08-01 ENCOUNTER — Ambulatory Visit: Admitting: Gastroenterology

## 2024-08-01 VITALS — BP 175/79 | HR 60 | Temp 98.6°F | Ht 68.0 in | Wt 215.2 lb

## 2024-08-01 DIAGNOSIS — Z860100 Personal history of colon polyps, unspecified: Secondary | ICD-10-CM

## 2024-08-01 DIAGNOSIS — K5900 Constipation, unspecified: Secondary | ICD-10-CM | POA: Diagnosis not present

## 2024-08-01 NOTE — Progress Notes (Signed)
 Gastroenterology Office Note     Primary Care Physician:  Bertell Satterfield, MD  Primary Gastroenterologist: Dr. Eartha    Chief Complaint   Chief Complaint  Patient presents with   Follow-up    Follow up fatty liver. Pt complains of blow outs     History of Present Illness   Timothy [name] is a 78 y.o. male presenting today with a history of bladder cancer, DM, CAD with stent, HLD, HTN, NSTEMI, PVD, hepatic steatosis. In interim from last visit, he underwent surveillance colonoscopy and is here for follow-up.   Limited historian.  Notes chronic lower abdominal nagging discomfort.  Also notes it periumbilically at times.  Difficult to obtain adequate history.  Notes blowouts intermittent tentatively and especially at nighttime.  However upon further investigation these blowouts are related to gas.  He notes a bowel movement every day.  After further discussion he states his volumes are small pieces.  Notes intermittent belching and states it is a foul odor.  However, he denies any nausea, vomiting, dysphagia, typical reflux symptoms.  He has no loss of appetite.  No unintentional weight loss.  Denies any food aversions or food fear.  Denies any fecal incontinence.  Denies any diarrhea.  He enjoys eating Pentids, cabbage, greens.  He states this gives him increased blowouts.  He does not appear to have any associated urinary symptoms with the abdominal discomfort.  He thought he might feel better after having polyps removed.  Notably, he did note some improvement in his symptoms after course of antibiotics last year given by his PCP for presumed diverticulitis.   Last colonoscopy June 2025:  - Three 3 to 5 mm polyps in the transverse colon                            and in the ascending colon, removed with a cold                            snare. Resected and retrieved.                           - Diverticulosis in the sigmoid colon.                           - Non-bleeding  internal hemorrhoids. (Tubular adenomas) surveillance in 5 years.    Colonoscopy 2020: two 5 mm polyps in ascending colon and cecum, two small polyps in cecum, diverticulosis, 5 year surveillance, tubular adenomas   Past Medical History:  Diagnosis Date   Arthritis    Bladder cancer (HCC)    Blood clot in vein 2018   left   CAD (coronary artery disease)    a. 06/2014: NSTEMI s/p PTCA of LCx, 60-80% residual in distal LAD, 70% prox small D1.   b. 02/06/15 NSTEMI  s/p successful PCI/DES to mid RCA c. angioplasty alone to LCx in 11/2016   Decreased GFR 45 % per 01-04-2021 labs   Diabetes mellitus, type 2 (HCC)    Elevated PSA    HLD (hyperlipidemia)    HOH (hard of hearing)    Hypertension    NSTEMI (non-ST elevated myocardial infarction) (HCC) 2015   Peripheral vascular disease (HCC)    Poor historian    Wears hearing aid in right ear     Past Surgical  History:  Procedure Laterality Date   ABDOMINAL AORTOGRAM W/LOWER EXTREMITY N/A 08/28/2018   Procedure: ABDOMINAL AORTOGRAM W/LOWER EXTREMITY;  Surgeon: Serene Gaile ORN, MD;  Location: [facility];  Service: Cardiovascular;  Laterality: N/A;  bilateral   ABDOMINAL AORTOGRAM W/LOWER EXTREMITY N/A 07/09/2019   Procedure: ABDOMINAL AORTOGRAM W/LOWER EXTREMITY;  Surgeon: Serene Gaile ORN, MD;  Location: [facility];  Service: Cardiovascular;  Laterality: N/A;  unilateral Lt   ABDOMINAL AORTOGRAM W/LOWER EXTREMITY N/A 06/13/2023   Procedure: ABDOMINAL AORTOGRAM W/LOWER EXTREMITY;  Surgeon: Serene Gaile ORN, MD;  Location: [facility];  Service: Cardiovascular;  Laterality: N/A;   CARDIAC CATHETERIZATION  02/06/2015   Procedure: CORONARY STENT INTERVENTION;  Surgeon: Alm ORN Clay, MD;  Location: [facility] CATH LAB;  Service: Cardiovascular;;  Mid RCA   CARDIAC CATHETERIZATION N/A 11/11/2016   Procedure: Left Heart Cath and Coronary Angiography;  Surgeon: Candyce GORMAN Reek, MD;  Location: [facility] INVASIVE CV LAB;  Service: Cardiovascular;   Laterality: N/A;   CARDIAC CATHETERIZATION N/A 11/11/2016   Procedure: Coronary Balloon Angioplasty;  Surgeon: Candyce GORMAN Reek, MD;  Location: [facility] INVASIVE CV LAB;  Service: Cardiovascular;  Laterality: N/A;   COLONOSCOPY N/A 01/14/2019   Procedure: COLONOSCOPY;  Surgeon: Golda Claudis PENNER, MD;  Location: [facility];  Service: Endoscopy;  Laterality: N/A;  730   COLONOSCOPY N/A 05/07/2024   Procedure: COLONOSCOPY;  Surgeon: Eartha Angelia Sieving, MD;  Location: [facility];  Service: Gastroenterology;  Laterality: N/A;  900am, asa 3   CORONARY ANGIOPLASTY  11/11/2016   Mid Cx to Dist Cx lesion, 100 %stenosed. This lesion was treated with balloon angioplasty with a 2.0 balloon   CYSTOSCOPY W/ RETROGRADES N/A 05/05/2015   Procedure: CYSTOSCOPY WITH BILATERAL RETROGRADE PYELOGRAMS AND BLADDER BIOPSIES;  Surgeon: Garnette Shack, MD;  Location: [facility];  Service: Urology;  Laterality: N/A;   CYSTOSCOPY W/ RETROGRADES Bilateral 07/31/2018   Procedure: CYSTOSCOPY WITH BILATERAL RETROGRADE PYELOGRAM;  Surgeon: Shack Garnette, MD;  Location: [facility];  Service: Urology;  Laterality: Bilateral;   CYSTOSCOPY W/ RETROGRADES Right 02/15/2021   Procedure: CYSTOSCOPY WITH RETROGRADE PYELOGRAM;  Surgeon: Shack Garnette, MD;  Location: [facility];  Service: Urology;  Laterality: Right;   CYSTOSCOPY WITH BIOPSY N/A 07/31/2018   Procedure: CYSTOSCOPY WITH BIOPSY;  Surgeon: Shack Garnette, MD;  Location: [facility];  Service: Urology;  Laterality: N/A;   CYSTOSCOPY WITH BIOPSY N/A 02/15/2021   Procedure: CYSTOSCOPY WITH BIOPSY;  Surgeon: Shack Garnette, MD;  Location: [facility];  Service: Urology;  Laterality: N/A;   LEFT HEART CATHETERIZATION WITH CORONARY ANGIOGRAM N/A 06/17/2014   Procedure: LEFT HEART CATHETERIZATION WITH CORONARY ANGIOGRAM;  Surgeon: Alm ORN Clay, MD;  Location: [facility] CATH LAB;  Service: Cardiovascular;  Laterality: N/A;   LEFT HEART  CATHETERIZATION WITH CORONARY ANGIOGRAM N/A 02/06/2015   Procedure: LEFT HEART CATHETERIZATION WITH CORONARY ANGIOGRAM;  Surgeon: Alm ORN Clay, MD;  Location: [facility] CATH LAB;  Service: Cardiovascular;  Laterality: N/A;   PERIPHERAL VASCULAR ATHERECTOMY  08/28/2018   Procedure: PERIPHERAL VASCULAR ATHERECTOMY;  Surgeon: Serene Gaile ORN, MD;  Location: [facility];  Service: Cardiovascular;;  Prox lt.SFA, Distal SFA   PERIPHERAL VASCULAR INTERVENTION  07/09/2019   Procedure: PERIPHERAL VASCULAR INTERVENTION;  Surgeon: Serene Gaile ORN, MD;  Location: [facility];  Service: Cardiovascular;;  Lt. SFA   PERIPHERAL VASCULAR INTERVENTION  06/13/2023   Procedure: PERIPHERAL VASCULAR INTERVENTION;  Surgeon: Serene Gaile ORN, MD;  Location: [facility];  Service: Cardiovascular;;  Lt SFA   POLYPECTOMY  01/14/2019   Procedure: POLYPECTOMY;  Surgeon: Golda Claudis PENNER, MD;  [facility];  Service: Endoscopy;;  colon   PROSTATE BIOPSY N/A 03/31/2014   Procedure: BIOPSY TRANSRECTAL ULTRASONIC PROSTATE (TUBP);  Surgeon: Garnette Shack, MD;  Location: [facility];  Service: Urology;  Laterality: N/A;   PROSTATE BIOPSY N/A 02/15/2021   Procedure: BIOPSY TRANSRECTAL ULTRASONIC PROSTATE (TUBP);  Surgeon: Shack Garnette, MD;  Location: [facility];  Service: Urology;  Laterality: N/A;  45 MINS   TRANSURETHRAL RESECTION OF BLADDER TUMOR N/A 03/14/2013   Procedure: TRANSURETHRAL RESECTION OF BLADDER TUMOR (TURBT);  Surgeon: Garnette Shack, MD;  Location: [facility];  Service: Urology;  Laterality: N/A;  1 HR WITH MITOMYCIN  INSTILLATION    TRANSURETHRAL RESECTION OF BLADDER TUMOR N/A 09/30/2013   Procedure: TRANSURETHRAL RESECTION OF BLADDER TUMOR (TURBT);  Surgeon: Garnette Shack, MD;  Location: [facility];  Service: Urology;  Laterality: N/A;   TRANSURETHRAL RESECTION OF BLADDER TUMOR N/A 03/31/2014   Procedure:  TRANSURETHRAL RESECTION OF BLADDER TUMOR (TURBT);  Surgeon: Garnette Shack, MD;  Location: [facility];  Service: Urology;  Laterality: N/A;   TRANSURETHRAL RESECTION OF BLADDER TUMOR N/A 05/31/2015   Procedure: Cystoscopy with clott evacuation and fulgeration of bleeders, retrograde pylegram;  Surgeon: Ricardo Likens, MD;  Location: [facility];  Service: Urology;  Laterality: N/A;    Current Outpatient Medications  Medication Sig Dispense Refill   acetaminophen  (TYLENOL ) 500 MG tablet Take 500 mg by mouth every 6 (six) hours as needed (pain).     aspirin  81 MG chewable tablet Chew 1 tablet (81 mg total) by mouth daily.     carvedilol  (COREG ) 12.5 MG tablet TAKE 1 TABLET BY MOUTH TWICE A DAY 180 tablet 3   cilostazol  (PLETAL ) 100 MG tablet Take 1 tablet (100 mg total) by mouth 2 (two) times daily before a meal. 60 tablet 11   clopidogrel  (PLAVIX ) 75 MG tablet Take 1 tablet (75 mg total) by mouth daily. 30 tablet 11   dapagliflozin  propanediol (FARXIGA ) 5 MG TABS tablet Take 1 tablet (5 mg total) by mouth daily before breakfast. 90 tablet 3   diclofenac Sodium (VOLTAREN) 1 % GEL Apply 2 g topically daily as needed (pain).     Evolocumab  (REPATHA  SURECLICK) 140 MG/ML SOAJ INJECT 140 MG INTO THE SKIN EVERY 14 (FOURTEEN) DAYS. 6 mL 3   glimepiride  (AMARYL ) 2 MG tablet Take 1 tablet (2 mg total) by mouth 2 (two) times daily. 180 tablet 3   losartan  (COZAAR ) 25 MG tablet TAKE 1 TABLET (25 MG TOTAL) BY MOUTH DAILY. 90 tablet 3   solifenacin  (VESICARE ) 10 MG tablet Take 1 tablet (10 mg total) by mouth daily. 30 tablet 11   TRADJENTA  5 MG TABS tablet Take 1 tablet (5 mg total) by mouth daily. 90 tablet 3   No current facility-administered medications for this visit.    Allergies as of 08/01/2024 - Review Complete 08/01/2024  Allergen Reaction Noted   Lipitor [atorvastatin ] Other (See Comments) 08/07/2014   Other Other (See Comments) 05/28/2015   Pravastatin  Other (See Comments)  08/07/2014    Family History  Problem Relation Age of Onset   Asthma Mother    Cancer Child    Colon cancer Neg Hx     Social History   Socioeconomic History   Marital status: Married    Spouse name: Not on file   Number  of children: Not on file   Years of education: Not on file   Highest education level: Not on file  Occupational History   Not on file  Tobacco Use   Smoking status: Former    Current packs/day: 0.00    Average packs/day: 1 pack/day for 30.0 years (30.0 ttl pk-yrs)    Types: Cigarettes    Start date: 03/12/1963    Quit date: 03/11/1993    Years since quitting: 31.4   Smokeless tobacco: Never  Vaping Use   Vaping status: Never Used  Substance and Sexual Activity   Alcohol  use: No    Alcohol /week: 0.0 standard drinks of alcohol    Drug use: No   Sexual activity: Not Currently  Other Topics Concern   Not on file  Social History Narrative   Not on file   Social Drivers of Health   Financial Resource Strain: Low Risk  (09/26/2023)   Overall Financial Resource Strain (CARDIA)    Difficulty of Paying Living Expenses: Not very hard  Food Insecurity: No Food Insecurity (09/11/2023)   Hunger Vital Sign    Worried About Running Out of Food in the Last Year: Never true    Ran Out of Food in the Last Year: Never true  Transportation Needs: No Transportation Needs (09/26/2023)   PRAPARE - Administrator, Civil Service (Medical): No    Lack of Transportation (Non-Medical): No  Physical Activity: Sufficiently Active (09/11/2023)   Exercise Vital Sign    Days of Exercise per Week: 5 days    Minutes of Exercise per Session: 30 min  Stress: Not on file  Social Connections: Not on file  Intimate Partner Violence: Not on file     Review of Systems   Gen: Denies any fever, chills, fatigue, weight loss, lack of appetite.  CV: Denies chest pain, heart palpitations, peripheral edema, syncope.  Resp: Denies shortness of breath at rest or with exertion.  Denies wheezing or cough.  GI: Denies dysphagia or odynophagia. Denies jaundice, hematemesis, fecal incontinence. GU : Denies urinary burning, urinary frequency, urinary hesitancy MS: Denies joint pain, muscle weakness, cramps, or limitation of movement.  Derm: Denies rash, itching, dry skin Psych: Denies depression, anxiety, memory loss, and confusion Heme: Denies bruising, bleeding, and enlarged lymph nodes.   Physical Exam   BP (!) 175/79   Pulse 60   Temp 98.6 F (37 C)   Ht 5' 8 (1.727 m)   Wt 215 lb 3.2 oz (97.6 kg)   BMI 32.72 kg/m  General:   Alert and oriented. Pleasant and cooperative. Well-nourished and well-developed.  Head:  Normocephalic and atraumatic. Eyes:  Without icterus Abdomen:  +BS, soft, non-tender with deep palpation and non-distended. No HSM noted. No guarding or rebound. No masses appreciated.  Rectal:  Deferred  Msk:  Symmetrical without gross deformities. Normal posture. Extremities:  Without edema. Neurologic:  Alert and  oriented x4;  grossly normal neurologically. Skin:  Intact without significant lesions or rashes. Psych:  Alert and cooperative. Normal mood and affect.   Assessment   Timothy [name] is a 78 y.o. male presenting today with a history of  bladder cancer, DM, CAD with stent, HLD, HTN, NSTEMI, PVD, hepatic steatosis. In interim from last visit, he underwent surveillance colonoscopy and is here for follow-up.  He is a delightful but difficult historian.  He continues to note chronic lower abdominal discomfort, and this appears most likely associated with unproductive bowel habits and underlying constipation.  What he describes  as blowouts are actually increased flatus, and he endorses different types of foods that are typical for increased gas and bloating.  He has no alarm signs or symptoms such as weight loss, food aversions, food fear, incontinence.  With his constellation of symptoms, we will start with avoidance of gas causing  foods, and we will also start him on MiraLAX once daily.  I would like to avoid prescription in this scenario as to avoid any confusion and limit polypharmacy.  Ultimately, he might benefit from Amitiza or Linzess in the future.  As a note, his abdominal exam is completely benign.  Additionally, he did have SIBO.  We will address constipation first, and we can further discuss this at his next early interval follow-up.    PLAN    Start MiraLAX 1 capful daily. Diet sheet provided for gas and bloating. Return in 6 to 8 weeks Colonoscopy in 5 years Call if no improvement or worsening.   Timothy MICAEL Stager, PhD, ANP-BC [facility] Gastroenterology   I have reviewed the note and agree with the APP's assessment as described in this progress note  Toribio Fortune, MD Gastroenterology and Hepatology [facility] Gastroenterology

## 2024-08-01 NOTE — Patient Instructions (Addendum)
 Let's start Miralax each morning. Take 1 capful in 8 ounces of your favorite beverage. This should hopefully help you with your bowel movements (#2).   I have also included handouts of foods that are probably making you have the blowouts.  Avoid these for the next 1 to 2 weeks and see if you notice any improvement.  Will see you in 6 to 8 weeks or sooner if needed.  I enjoyed seeing you again today! I value our relationship and want to provide genuine, compassionate, and quality care. You may receive a survey regarding your visit with me, and I welcome your feedback! Thanks so much for taking the time to complete this. I look forward to seeing you again.      Therisa MICAEL Stager, PhD, ANP-BC [facility] Gastroenterology

## 2024-08-16 ENCOUNTER — Other Ambulatory Visit: Payer: Self-pay | Admitting: Student

## 2024-09-02 ENCOUNTER — Ambulatory Visit: Admitting: Nurse Practitioner

## 2024-09-11 ENCOUNTER — Other Ambulatory Visit: Payer: Self-pay | Admitting: *Deleted

## 2024-09-11 ENCOUNTER — Telehealth: Payer: Self-pay | Admitting: Nurse Practitioner

## 2024-09-11 DIAGNOSIS — Z7984 Long term (current) use of oral hypoglycemic drugs: Secondary | ICD-10-CM

## 2024-09-11 DIAGNOSIS — E1122 Type 2 diabetes mellitus with diabetic chronic kidney disease: Secondary | ICD-10-CM

## 2024-09-11 MED ORDER — ONETOUCH ULTRA TEST VI STRP: ORAL_STRIP | Status: DC

## 2024-09-11 NOTE — Telephone Encounter (Signed)
 A prescription was sent to the [facility] in [place], [place].

## 2024-09-11 NOTE — Telephone Encounter (Signed)
 Pt needs refill of onetouch ultra test strips [facility] into [facility] in [place], [place]

## 2024-09-16 NOTE — Progress Notes (Signed)
 Impression/Assessment:  1.  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  Last PSA 3.66 months ago  2.  History of high-grade nonmuscle invasive bladder cancer with history of immune therapy, urinalysis today clear  3.  BPH  4.  Voiding symptoms-sweating with urination.  Perhaps an autonomic response  Plan:  1.  Urine is sent for cytology with reflex to FISH  2.  Stop solifenacin , I will put him on tolterodine  3.  PSA is checked today  4.  Office visit in 6 months for cystoscopy  History of Present Illness:     Timothy [name] presents for the following issues   BPH:    Patient is currently treated with tamsulosin  for his symptoms.  He does have urgency and frequency.  Did not tolerate the solifenacin -he said it caused leg cramps.   . Bladder Cancer:   Initial TURBT on 4.10.2014. He had papillary, low-grade, NMIBC. He eventually was found to have recurrence within 6 months, and underwent repeat TURBT on 10.27.2014. Again, this was low-grade, NMIBC. Because of his recurrence in a short period of time, despite using mitomycin  after his first and second treatments, he underwent BCG induction, which was completed in January, 2014. He did have a significant inflammatory response to the mitomycin .    In April, 2015 at routine follow-up, he was found to have 2 recurrences, one approximately 1.5 and 1 approximately 1.0 cm. He underwent TURBT on 4.27.2015. This revealed high-grade NMIBC. He completed a second BCG induction course in early August, 2015. He subsequently underwent cystoscopy, bilateral retrogrades, and bladder biopsy on 5.3.2016. Biopsies of the bladder were negative. Retrograde studies were normal. He has had significant dysuria during his treatments. He has completed 3 maintenance BCG treatments, the last of which was 11.29.2016.    Because of the patient's significant side effects from his BCG i.e. dysuria, bladder pain, we stopped maintenance  therapy after his 11.29.2016 dose.    8.26.2019: Because of positive cytologies, he underwent cystoscopy, bilateral retrogrades/renal washings, bladder barbotage and bladder biopsy. Bladder washings were negative, barbotage specimen negative, biopsies revealed only benign/inflammatory change.     3.14.2022: Repeat anesthetic cystoscopy and biopsy revealed benign urothelial changes with chronic inflammation.  No carcinoma identified.   4.9.2024: Cystoscopy negative, cytology revealed atypical cells. 10.8.2024: Cystoscopy negative, cytology revealed atypical cells again. 4.8.2025: Cystoscopy negative, cytology showed atypical cells   Prostate Cancer:   He is participating in active surveillance.    Because of a rising PSA, he underwent a concurrent transrectal ultrasound and biopsy of his prostate on 4.27.2015. Prostatic volume was 53.55 cm. PSA was 9.13. PSA density 0.17. 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 That biopsy was left base lateral, and less than 5% of the core was involved. There was no granulomatous reaction noted. He underwent repeat/surveillance biopsy on 1.8.2015. Prostatic volume was measured at 53.5 mL. All 12 cores returned negative for adenocarcinoma, with 1 core in the right apex revealing chronic granulomatous inflammation.    2.11.2020: PSA 8.7  3.23.2021: PSA 6.2 11.2.2021: PSA 8.0   3.14.2022: Repeat ultrasound and biopsy of the prostate.  No adenocarcinoma found.  Most cores had focal inflammation and granulomas.  Prostate volume 74 mL.  5.9.2023: PSA 5.4 10.8.2024: PSA 3.6 4.8.2025: PSA 2.3   10.14.2025: Here for routine check.  No gross hematuria.  PSA 6 months ago low at 2.3. Past Medical History:  Diagnosis Date   Arthritis    Bladder cancer (HCC)  Blood clot in vein 2018   left   CAD (coronary artery disease)    a. 06/2014: NSTEMI s/p PTCA of LCx, 60-80% residual in distal LAD, 70% prox small D1.   b. 02/06/15 NSTEMI  s/p successful PCI/DES to mid RCA  c. angioplasty alone to LCx in 11/2016   Decreased GFR 45 % per 01-04-2021 labs   Diabetes mellitus, type 2 (HCC)    Elevated PSA    HLD (hyperlipidemia)    HOH (hard of hearing)    Hypertension    NSTEMI (non-ST elevated myocardial infarction) (HCC) 2015   Peripheral vascular disease    Poor historian    Wears hearing aid in right ear     Past Surgical History:  Procedure Laterality Date   ABDOMINAL AORTOGRAM W/LOWER EXTREMITY N/A 08/28/2018   Procedure: ABDOMINAL AORTOGRAM W/LOWER EXTREMITY;  Surgeon: Serene Gaile ORN, MD;  Location: [facility];  Service: Cardiovascular;  Laterality: N/A;  bilateral   ABDOMINAL AORTOGRAM W/LOWER EXTREMITY N/A 07/09/2019   Procedure: ABDOMINAL AORTOGRAM W/LOWER EXTREMITY;  Surgeon: Serene Gaile ORN, MD;  Location: [facility];  Service: Cardiovascular;  Laterality: N/A;  unilateral Lt   ABDOMINAL AORTOGRAM W/LOWER EXTREMITY N/A 06/13/2023   Procedure: ABDOMINAL AORTOGRAM W/LOWER EXTREMITY;  Surgeon: Serene Gaile ORN, MD;  Location: [facility];  Service: Cardiovascular;  Laterality: N/A;   CARDIAC CATHETERIZATION  02/06/2015   Procedure: CORONARY STENT INTERVENTION;  Surgeon: Alm ORN Clay, MD;  Location: [facility] CATH LAB;  Service: Cardiovascular;;  Mid RCA   CARDIAC CATHETERIZATION N/A 11/11/2016   Procedure: Left Heart Cath and Coronary Angiography;  Surgeon: Candyce GORMAN Reek, MD;  [facility]: [facility] INVASIVE CV LAB;  Service: Cardiovascular;  Laterality: N/A;   CARDIAC CATHETERIZATION N/A 11/11/2016   Procedure: Coronary Balloon Angioplasty;  Surgeon: Candyce GORMAN Reek, MD;  Location: [facility] INVASIVE CV LAB;  Service: Cardiovascular;  Laterality: N/A;   COLONOSCOPY N/A 01/14/2019   Procedure: COLONOSCOPY;  Surgeon: Golda Claudis PENNER, MD;  Location: [facility];  Service: Endoscopy;  Laterality: N/A;  730   COLONOSCOPY N/A 05/07/2024   Procedure: COLONOSCOPY;  Surgeon: Eartha Angelia Sieving, MD;  Location: [facility];  Service:  Gastroenterology;  Laterality: N/A;  900am, asa 3   CORONARY ANGIOPLASTY  11/11/2016   Mid Cx to Dist Cx lesion, 100 %stenosed. This lesion was treated with balloon angioplasty with a 2.0 balloon   CYSTOSCOPY W/ RETROGRADES N/A 05/05/2015   Procedure: CYSTOSCOPY WITH BILATERAL RETROGRADE PYELOGRAMS AND BLADDER BIOPSIES;  Surgeon: Garnette Shack, MD;  Location: [facility];  Service: Urology;  Laterality: N/A;   CYSTOSCOPY W/ RETROGRADES Bilateral 07/31/2018   Procedure: CYSTOSCOPY WITH BILATERAL RETROGRADE PYELOGRAM;  Surgeon: Shack Garnette, MD;  Location: [facility];  Service: Urology;  Laterality: Bilateral;   CYSTOSCOPY W/ RETROGRADES Right 02/15/2021   Procedure: CYSTOSCOPY WITH RETROGRADE PYELOGRAM;  Surgeon: Shack Garnette, MD;  Location: [facility];  Service: Urology;  Laterality: Right;   CYSTOSCOPY WITH BIOPSY N/A 07/31/2018   Procedure: CYSTOSCOPY WITH BIOPSY;  Surgeon: Shack Garnette, MD;  Location: [facility];  Service: Urology;  Laterality: N/A;   CYSTOSCOPY WITH BIOPSY N/A 02/15/2021   Procedure: CYSTOSCOPY WITH BIOPSY;  Surgeon: Shack Garnette, MD;  Location: [facility];  Service: Urology;  Laterality: N/A;   LEFT HEART CATHETERIZATION WITH CORONARY ANGIOGRAM N/A 06/17/2014   Procedure: LEFT HEART CATHETERIZATION WITH CORONARY ANGIOGRAM;  Surgeon: Alm ORN Clay, MD;  Location: [facility] CATH LAB;  Service: Cardiovascular;  Laterality: N/A;  LEFT HEART CATHETERIZATION WITH CORONARY ANGIOGRAM N/A 02/06/2015   Procedure: LEFT HEART CATHETERIZATION WITH CORONARY ANGIOGRAM;  Surgeon: Alm LELON Clay, MD;  Location: [facility] CATH LAB;  Service: Cardiovascular;  Laterality: N/A;   PERIPHERAL VASCULAR ATHERECTOMY  08/28/2018   Procedure: PERIPHERAL VASCULAR ATHERECTOMY;  Surgeon: Serene Gaile LELON, MD;  Location: [facility];  Service: Cardiovascular;;  Prox lt.SFA, Distal SFA   PERIPHERAL VASCULAR INTERVENTION  07/09/2019   Procedure: PERIPHERAL VASCULAR  INTERVENTION;  Surgeon: Serene Gaile LELON, MD;  Location: [facility];  Service: Cardiovascular;;  Lt. SFA   PERIPHERAL VASCULAR INTERVENTION  06/13/2023   Procedure: PERIPHERAL VASCULAR INTERVENTION;  Surgeon: Serene Gaile LELON, MD;  Location: [facility];  Service: Cardiovascular;;  Lt SFA   POLYPECTOMY  01/14/2019   Procedure: POLYPECTOMY;  Surgeon: Golda Claudis PENNER, MD;  [facility]: [facility];  Service: Endoscopy;;  colon   PROSTATE BIOPSY N/A 03/31/2014   Procedure: BIOPSY TRANSRECTAL ULTRASONIC PROSTATE (TUBP);  Surgeon: Garnette Shack, MD;  Location: [facility];  Service: Urology;  Laterality: N/A;   PROSTATE BIOPSY N/A 02/15/2021   Procedure: BIOPSY TRANSRECTAL ULTRASONIC PROSTATE (TUBP);  Surgeon: Shack Garnette, MD;  Location: [facility];  Service: Urology;  Laterality: N/A;  45 MINS   TRANSURETHRAL RESECTION OF BLADDER TUMOR N/A 03/14/2013   Procedure: TRANSURETHRAL RESECTION OF BLADDER TUMOR (TURBT);  Surgeon: Garnette Shack, MD;  Location: [facility];  Service: Urology;  Laterality: N/A;  1 HR WITH MITOMYCIN  INSTILLATION    TRANSURETHRAL RESECTION OF BLADDER TUMOR N/A 09/30/2013   Procedure: TRANSURETHRAL RESECTION OF BLADDER TUMOR (TURBT);  Surgeon: Garnette Shack, MD;  Location: [facility];  Service: Urology;  Laterality: N/A;   TRANSURETHRAL RESECTION OF BLADDER TUMOR N/A 03/31/2014   Procedure: TRANSURETHRAL RESECTION OF BLADDER TUMOR (TURBT);  Surgeon: Garnette Shack, MD;  Location: [facility];  Service: Urology;  Laterality: N/A;   TRANSURETHRAL RESECTION OF BLADDER TUMOR N/A 05/31/2015   Procedure: Cystoscopy with clott evacuation and fulgeration of bleeders, retrograde pylegram;  Surgeon: Ricardo Likens, MD;  Location: [facility];  Service: Urology;  Laterality: N/A;    Home Medications:  Allergies as of 09/17/2024       Reactions   Lipitor [atorvastatin ] Other (See  Comments)   Myalgia   Other Other (See Comments)   All Cholesterol Meds - causes muscle and joint pain   Pravastatin  Other (See Comments)   Myalgia        Medication List        Accurate as of September 16, 2024  4:24 PM. If you have any questions, ask your nurse or doctor.          acetaminophen  500 MG tablet Commonly known as: TYLENOL  Take 500 mg by mouth every 6 (six) hours as needed (pain).   aspirin  81 MG chewable tablet Chew 1 tablet (81 mg total) by mouth daily.   carvedilol  12.5 MG tablet Commonly known as: COREG  TAKE 1 TABLET BY MOUTH TWICE A DAY   cilostazol  100 MG tablet Commonly known as: PLETAL  Take 1 tablet (100 mg total) by mouth 2 (two) times daily before a meal.   clopidogrel  75 MG tablet Commonly known as: Plavix  Take 1 tablet (75 mg total) by mouth daily.   dapagliflozin  propanediol 5 MG Tabs tablet Commonly known as: Farxiga  Take 1 tablet (5 mg total) by mouth daily before breakfast.   diclofenac Sodium 1 % Gel Commonly known as: VOLTAREN Apply  2 g topically daily as needed (pain).   glimepiride  2 MG tablet Commonly known as: AMARYL  Take 1 tablet (2 mg total) by mouth 2 (two) times daily.   losartan  25 MG tablet Commonly known as: COZAAR  TAKE 1 TABLET (25 MG TOTAL) BY MOUTH DAILY.   OneTouch Ultra Test test strip Generic drug: glucose blood Use a (one) strip to test blood sugars twice a day as directed by Provider.   Repatha  SureClick 140 MG/ML Soaj Generic drug: Evolocumab  INJECT 140 MG INTO THE SKIN EVERY 14 (FOURTEEN) DAYS.   solifenacin  10 MG tablet Commonly known as: VESICARE  Take 1 tablet (10 mg total) by mouth daily.   Tradjenta  5 MG Tabs tablet Generic drug: linagliptin  Take 1 tablet (5 mg total) by mouth daily.        Allergies:  Allergies  Allergen Reactions   Lipitor [Atorvastatin ] Other (See Comments)    Myalgia   Other Other (See Comments)    All Cholesterol Meds - causes muscle and joint pain    Pravastatin  Other (See Comments)    Myalgia    Family History  Problem Relation Age of Onset   Asthma Mother    Cancer Child    Colon cancer Neg Hx     Social History:  reports that he quit smoking about 31 years ago. His smoking use included cigarettes. He started smoking about 61 years ago. He has a 30 pack-year smoking history. He has never used smokeless tobacco. He reports that he does not drink alcohol  and does not use drugs.  ROS: A complete review of systems was performed.  All systems are negative except for pertinent findings as noted.  Cystoscopy Procedure Note:  Indication: Bladder cancer surveillance  After informed consent and discussion of the procedure and its risks, Timothy [name] was positioned and prepped in the standard fashion.  Cystoscopy was performed with a flexible cystoscope.   Findings: Urethra: No stricture or lesion Prostate: Obstructive with bilobar hypertrophy Bladder neck: Open Ureteral orifices: Normal Bladder: Old biopsy sites once again visualized.  No urothelial abnormalities  The patient tolerated the procedure well.     I have reviewed prior pt notes  I have reviewed urinalysis results--clear today  I have independently reviewed prior imaging-prostate ultrasound volume  I have reviewed prior PSA and pathology results  I have reviewed prior urine cytologies

## 2024-09-17 ENCOUNTER — Encounter: Payer: Self-pay | Admitting: Urology

## 2024-09-17 ENCOUNTER — Ambulatory Visit (INDEPENDENT_AMBULATORY_CARE_PROVIDER_SITE_OTHER): Admitting: Urology

## 2024-09-17 DIAGNOSIS — Z8551 Personal history of malignant neoplasm of bladder: Secondary | ICD-10-CM

## 2024-09-17 DIAGNOSIS — N401 Enlarged prostate with lower urinary tract symptoms: Secondary | ICD-10-CM | POA: Diagnosis not present

## 2024-09-17 DIAGNOSIS — R39198 Other difficulties with micturition: Secondary | ICD-10-CM | POA: Diagnosis not present

## 2024-09-17 DIAGNOSIS — R350 Frequency of micturition: Secondary | ICD-10-CM | POA: Diagnosis not present

## 2024-09-17 DIAGNOSIS — R3915 Urgency of urination: Secondary | ICD-10-CM | POA: Diagnosis not present

## 2024-09-17 DIAGNOSIS — Z08 Encounter for follow-up examination after completed treatment for malignant neoplasm: Secondary | ICD-10-CM

## 2024-09-17 DIAGNOSIS — C61 Malignant neoplasm of prostate: Secondary | ICD-10-CM

## 2024-09-17 LAB — URINALYSIS, ROUTINE W REFLEX MICROSCOPIC
Bilirubin, UA: NEGATIVE
Ketones, UA: NEGATIVE
Leukocytes,UA: NEGATIVE
Nitrite, UA: NEGATIVE
Protein,UA: NEGATIVE
RBC, UA: NEGATIVE
Specific Gravity, UA: 1.01 (ref 1.005–1.030)
Urobilinogen, Ur: 1 mg/dL (ref 0.2–1.0)
pH, UA: 5.5 (ref 5.0–7.5)

## 2024-09-17 MED ORDER — CIPROFLOXACIN HCL 500 MG PO TABS
500.0000 mg | ORAL_TABLET | Freq: Once | ORAL | Status: AC
  Administered 2024-09-17: 500 mg via ORAL

## 2024-09-17 MED ORDER — TOLTERODINE TARTRATE ER 4 MG PO CP24
4.0000 mg | ORAL_CAPSULE | Freq: Every day | ORAL | 11 refills | Status: AC
Start: 2024-09-17 — End: ?

## 2024-09-17 NOTE — Progress Notes (Signed)
 Urine sent for Fish cytology DIANON via ups

## 2024-09-18 ENCOUNTER — Encounter (INDEPENDENT_AMBULATORY_CARE_PROVIDER_SITE_OTHER): Payer: Self-pay | Admitting: Gastroenterology

## 2024-09-20 DIAGNOSIS — C61 Malignant neoplasm of prostate: Secondary | ICD-10-CM | POA: Diagnosis not present

## 2024-09-21 LAB — PSA: Prostate Specific Ag, Serum: 1.6 ng/mL (ref 0.0–4.0)

## 2024-09-23 ENCOUNTER — Ambulatory Visit: Payer: Self-pay

## 2024-09-23 NOTE — Telephone Encounter (Signed)
-----   Message from Timothy [name] sent at 09/23/2024  8:59 AM EDT ----- Please let patient know that PSA is now down to 1.9.  Great news! ----- Message ----- From: Rebecka Memos Lab Results In Sent: 09/21/2024   5:38 AM EDT To: Timothy Shack, MD

## 2024-09-23 NOTE — Telephone Encounter (Signed)
 Called and lvm of PSA results per MD Dahlstedt advised pt to c/b if needed

## 2024-09-25 LAB — UROVYSION FISH

## 2024-09-25 LAB — URINE CYTOLOGY: CYTOLOGY PLUS /FISH

## 2024-10-03 ENCOUNTER — Ambulatory Visit: Admitting: Gastroenterology

## 2024-10-03 ENCOUNTER — Encounter: Payer: Self-pay | Admitting: Gastroenterology

## 2024-10-03 VITALS — BP 153/87 | HR 61 | Temp 97.7°F | Ht 68.0 in | Wt 214.4 lb

## 2024-10-03 DIAGNOSIS — K5900 Constipation, unspecified: Secondary | ICD-10-CM

## 2024-10-03 DIAGNOSIS — Z860101 Personal history of adenomatous and serrated colon polyps: Secondary | ICD-10-CM

## 2024-10-03 MED ORDER — LINACLOTIDE 290 MCG PO CAPS: 290.0000 ug | ORAL_CAPSULE | Freq: Every day | ORAL | 3 refills | Status: DC

## 2024-10-03 MED ORDER — LINACLOTIDE 290 MCG PO CAPS: 290.0000 ug | ORAL_CAPSULE | Freq: Every day | ORAL | Status: DC

## 2024-10-03 NOTE — Patient Instructions (Signed)
 I have given you samples of the highest dosage of Linzess. We can adjust this if needed. Take your first dose today, 30 minutes before eating.  Diarrhea may occur in the first 2 weeks -keep taking it.  The diarrhea should go away and you should start having normal, complete, full bowel movements.  Make sure to always take this 30 minutes before eating once a day.  Let me know in about 2 weeks how this is working for you! I went ahead and sent a prescription in case it is working well!  We will see you in 6-8 weeks!  I enjoyed seeing you again today! I value our relationship and want to provide genuine, compassionate, and quality care. You may receive a survey regarding your visit with me, and I welcome your feedback! Thanks so much for taking the time to complete this. I look forward to seeing you again.      Therisa MICAEL Stager, PhD, ANP-BC [facility]

## 2024-10-03 NOTE — Progress Notes (Addendum)
 Gastroenterology Office Note     Primary Care Physician:  Bertell Satterfield, MD  Primary Gastroenterologist: Dr. Eartha    Chief Complaint   Chief Complaint  Patient presents with   Follow-up    States he takes miralax every other day and it is working.     History of Present Illness   Timothy [name] is a 78 y.o. male presenting today with a history of bladder cancer, DM, CAD with stent, HLD, HTN, NSTEMI, PVD, hepatic steatosis, constipation, SIBO, last seen Aug 2025 with constipation and started on Miralax. Returns today to discuss.   Miralax every other day, bristol stool scale #2-3. Still with some hard stool at times. Sometimes straining. Hasn't tried every other day Miralax. If eating greens, cabbage, will have a blow out. Bloating is better but still could be better. Will take pepto bismol if has gas or discomfort. This makes him constipated. No GERD. No dysphagia. No postprandial abdominal pain. Eats cabbage, cornbread.      Last colonoscopy June 2025:  - Three 3 to 5 mm polyps in the transverse colon                            and in the ascending colon, removed with a cold                            snare. Resected and retrieved.                           - Diverticulosis in the sigmoid colon.                           - Non-bleeding internal hemorrhoids. (Tubular adenomas) surveillance in 5 years.   US  abdomen April 2025 with fatty liver. LFTs normal.   Past Medical History:  Diagnosis Date   Arthritis    Bladder cancer (HCC)    Blood clot in vein 2018   left   CAD (coronary artery disease)    a. 06/2014: NSTEMI s/p PTCA of LCx, 60-80% residual in distal LAD, 70% prox small D1.   b. 02/06/15 NSTEMI  s/p successful PCI/DES to mid RCA c. angioplasty alone to LCx in 11/2016   Decreased GFR 45 % per 01-04-2021 labs   Diabetes mellitus, type 2 (HCC)    Elevated PSA    HLD (hyperlipidemia)    HOH (hard of hearing)    Hypertension    NSTEMI (non-ST elevated  myocardial infarction) (HCC) 2015   Peripheral vascular disease    Poor historian    Wears hearing aid in right ear     Past Surgical History:  Procedure Laterality Date   ABDOMINAL AORTOGRAM W/LOWER EXTREMITY N/A 08/28/2018   Procedure: ABDOMINAL AORTOGRAM W/LOWER EXTREMITY;  Surgeon: Serene Gaile ORN, MD;  Location: [facility];  Service: Cardiovascular;  Laterality: N/A;  bilateral   ABDOMINAL AORTOGRAM W/LOWER EXTREMITY N/A 07/09/2019   Procedure: ABDOMINAL AORTOGRAM W/LOWER EXTREMITY;  Surgeon: Serene Gaile ORN, MD;  Location: [facility];  Service: Cardiovascular;  Laterality: N/A;  unilateral Lt   ABDOMINAL AORTOGRAM W/LOWER EXTREMITY N/A 06/13/2023   Procedure: ABDOMINAL AORTOGRAM W/LOWER EXTREMITY;  Surgeon: Serene Gaile ORN, MD;  Location: [facility];  Service: Cardiovascular;  Laterality: N/A;   CARDIAC CATHETERIZATION  02/06/2015   Procedure: CORONARY STENT  INTERVENTION;  Surgeon: Alm LELON Clay, MD;  Location: [facility] CATH LAB;  Service: Cardiovascular;;  Mid RCA   CARDIAC CATHETERIZATION N/A 11/11/2016   Procedure: Left Heart Cath and Coronary Angiography;  Surgeon: Candyce GORMAN Reek, MD;  Location: [facility] INVASIVE CV LAB;  Service: Cardiovascular;  Laterality: N/A;   CARDIAC CATHETERIZATION N/A 11/11/2016   Procedure: Coronary Balloon Angioplasty;  Surgeon: Candyce GORMAN Reek, MD;  Location: [facility] INVASIVE CV LAB;  Service: Cardiovascular;  Laterality: N/A;   COLONOSCOPY N/A 01/14/2019   Procedure: COLONOSCOPY;  Surgeon: Golda Claudis PENNER, MD;  Location: [facility];  Service: Endoscopy;  Laterality: N/A;  730   COLONOSCOPY N/A 05/07/2024   Procedure: COLONOSCOPY;  Surgeon: Eartha Angelia Sieving, MD;  Location: [facility];  Service: Gastroenterology;  Laterality: N/A;  900am, asa 3   CORONARY ANGIOPLASTY  11/11/2016   Mid Cx to Dist Cx lesion, 100 %stenosed. This lesion was treated with balloon angioplasty with a 2.0 balloon   CYSTOSCOPY W/ RETROGRADES N/A 05/05/2015    Procedure: CYSTOSCOPY WITH BILATERAL RETROGRADE PYELOGRAMS AND BLADDER BIOPSIES;  Surgeon: Garnette Shack, MD;  Location: [facility];  Service: Urology;  Laterality: N/A;   CYSTOSCOPY W/ RETROGRADES Bilateral 07/31/2018   Procedure: CYSTOSCOPY WITH BILATERAL RETROGRADE PYELOGRAM;  Surgeon: Shack Garnette, MD;  Location: [facility];  Service: Urology;  Laterality: Bilateral;   CYSTOSCOPY W/ RETROGRADES Right 02/15/2021   Procedure: CYSTOSCOPY WITH RETROGRADE PYELOGRAM;  Surgeon: Shack Garnette, MD;  Location: [facility];  Service: Urology;  Laterality: Right;   CYSTOSCOPY WITH BIOPSY N/A 07/31/2018   Procedure: CYSTOSCOPY WITH BIOPSY;  Surgeon: Shack Garnette, MD;  Location: [facility];  Service: Urology;  Laterality: N/A;   CYSTOSCOPY WITH BIOPSY N/A 02/15/2021   Procedure: CYSTOSCOPY WITH BIOPSY;  Surgeon: Shack Garnette, MD;  Location: [facility];  Service: Urology;  Laterality: N/A;   LEFT HEART CATHETERIZATION WITH CORONARY ANGIOGRAM N/A 06/17/2014   Procedure: LEFT HEART CATHETERIZATION WITH CORONARY ANGIOGRAM;  Surgeon: Alm LELON Clay, MD;  Location: [facility] CATH LAB;  Service: Cardiovascular;  Laterality: N/A;   LEFT HEART CATHETERIZATION WITH CORONARY ANGIOGRAM N/A 02/06/2015   Procedure: LEFT HEART CATHETERIZATION WITH CORONARY ANGIOGRAM;  Surgeon: Alm LELON Clay, MD;  Location: [facility] CATH LAB;  Service: Cardiovascular;  Laterality: N/A;   PERIPHERAL VASCULAR ATHERECTOMY  08/28/2018   Procedure: PERIPHERAL VASCULAR ATHERECTOMY;  Surgeon: Serene Gaile LELON, MD;  [facility]: [facility];  Service: Cardiovascular;;  Prox lt.SFA, Distal SFA   PERIPHERAL VASCULAR INTERVENTION  07/09/2019   Procedure: PERIPHERAL VASCULAR INTERVENTION;  Surgeon: Serene Gaile LELON, MD;  Location: [facility];  Service: Cardiovascular;;  Lt. SFA   PERIPHERAL VASCULAR INTERVENTION  06/13/2023   Procedure: PERIPHERAL VASCULAR INTERVENTION;  Surgeon: Serene Gaile LELON, MD;  Location:  [facility];  Service: Cardiovascular;;  Lt SFA   POLYPECTOMY  01/14/2019   Procedure: POLYPECTOMY;  Surgeon: Golda Claudis PENNER, MD;  [facility];  Service: Endoscopy;;  colon   PROSTATE BIOPSY N/A 03/31/2014   Procedure: BIOPSY TRANSRECTAL ULTRASONIC PROSTATE (TUBP);  Surgeon: Garnette Shack, MD;  Location: [facility];  Service: Urology;  Laterality: N/A;   PROSTATE BIOPSY N/A 02/15/2021   Procedure: BIOPSY TRANSRECTAL ULTRASONIC PROSTATE (TUBP);  Surgeon: Shack Garnette, MD;  Location: [facility];  Service: Urology;  Laterality: N/A;  45 MINS   TRANSURETHRAL RESECTION OF BLADDER TUMOR N/A 03/14/2013   Procedure: TRANSURETHRAL RESECTION OF BLADDER TUMOR (TURBT);  Surgeon: Garnette Shack, MD;  Location: [facility];  Service: Urology;  Laterality: N/A;  1 HR WITH MITOMYCIN  INSTILLATION    TRANSURETHRAL RESECTION OF BLADDER TUMOR N/A 09/30/2013   Procedure: TRANSURETHRAL RESECTION OF BLADDER TUMOR (TURBT);  Surgeon: Garnette Shack, MD;  Location: [facility];  Service: Urology;  Laterality: N/A;   TRANSURETHRAL RESECTION OF BLADDER TUMOR N/A 03/31/2014   Procedure: TRANSURETHRAL RESECTION OF BLADDER TUMOR (TURBT);  Surgeon: Garnette Shack, MD;  Location: [facility];  Service: Urology;  Laterality: N/A;   TRANSURETHRAL RESECTION OF BLADDER TUMOR N/A 05/31/2015   Procedure: Cystoscopy with clott evacuation and fulgeration of bleeders, retrograde pylegram;  Surgeon: Ricardo Likens, MD;  Location: [facility];  Service: Urology;  Laterality: N/A;    Current Outpatient Medications  Medication Sig Dispense Refill   acetaminophen  (TYLENOL ) 500 MG tablet Take 500 mg by mouth every 6 (six) hours as needed (pain).     aspirin  81 MG chewable tablet Chew 1 tablet (81 mg total) by mouth daily.     carvedilol  (COREG ) 12.5 MG tablet TAKE 1 TABLET BY MOUTH TWICE A DAY 180 tablet 3   dapagliflozin  propanediol  (FARXIGA ) 5 MG TABS tablet Take 1 tablet (5 mg total) by mouth daily before breakfast. 90 tablet 3   Evolocumab  (REPATHA  SURECLICK) 140 MG/ML SOAJ INJECT 140 MG INTO THE SKIN EVERY 14 (FOURTEEN) DAYS. 6 mL 3   gabapentin (NEURONTIN) 100 MG capsule Take 100 mg by mouth 3 (three) times daily.     glimepiride  (AMARYL ) 2 MG tablet Take 1 tablet (2 mg total) by mouth 2 (two) times daily. 180 tablet 3   glucose blood (ONETOUCH ULTRA TEST) test strip Use a (one) strip to test blood sugars twice a day as directed by Provider. 100 each 05   losartan  (COZAAR ) 25 MG tablet TAKE 1 TABLET (25 MG TOTAL) BY MOUTH DAILY. 90 tablet 2   tolterodine (DETROL LA) 4 MG 24 hr capsule Take 1 capsule (4 mg total) by mouth daily. 30 capsule 11   TRADJENTA  5 MG TABS tablet Take 1 tablet (5 mg total) by mouth daily. 90 tablet 3   No current facility-administered medications for this visit.    Allergies as of 10/03/2024 - Review Complete 10/03/2024  Allergen Reaction Noted   Lipitor [atorvastatin ] Other (See Comments) 08/07/2014   Other Other (See Comments) 05/28/2015   Pravastatin  Other (See Comments) 08/07/2014    Family History  Problem Relation Age of Onset   Asthma Mother    Cancer Child    Colon cancer Neg Hx     Social History   Socioeconomic History   Marital status: Married    Spouse name: Not on file   Number of children: Not on file   Years of education: Not on file   Highest education level: Not on file  Occupational History   Not on file  Tobacco Use   Smoking status: Former    Current packs/day: 0.00    Average packs/day: 1 pack/day for 30.0 years (30.0 ttl pk-yrs)    Types: Cigarettes    Start date: 03/12/1963    Quit date: 03/11/1993    Years since quitting: 31.5   Smokeless tobacco: Never  Vaping Use   Vaping status: Never Used  Substance and Sexual Activity   Alcohol  use: No    Alcohol /week: 0.0 standard drinks of alcohol    Drug use: No   Sexual activity: Not Currently  Other  Topics Concern   Not on file  Social  History Narrative   Not on file   Social Drivers of Health   Financial Resource Strain: Low Risk  (09/26/2023)   Overall Financial Resource Strain (CARDIA)    Difficulty of Paying Living Expenses: Not very hard  Food Insecurity: No Food Insecurity (09/11/2023)   Hunger Vital Sign    Worried About Running Out of Food in the Last Year: Never true    Ran Out of Food in the Last Year: Never true  Transportation Needs: No Transportation Needs (09/26/2023)   PRAPARE - Administrator, Civil Service (Medical): No    Lack of Transportation (Non-Medical): No  Physical Activity: Sufficiently Active (09/11/2023)   Exercise Vital Sign    Days of Exercise per Week: 5 days    Minutes of Exercise per Session: 30 min  Stress: Not on file  Social Connections: Not on file  Intimate Partner Violence: Not on file     Review of Systems   Gen: Denies any fever, chills, fatigue, weight loss, lack of appetite.  CV: Denies chest pain, heart palpitations, peripheral edema, syncope.  Resp: Denies shortness of breath at rest or with exertion. Denies wheezing or cough.  GI: Denies dysphagia or odynophagia. Denies jaundice, hematemesis, fecal incontinence. GU : Denies urinary burning, urinary frequency, urinary hesitancy MS: Denies joint pain, muscle weakness, cramps, or limitation of movement.  Derm: Denies rash, itching, dry skin Psych: Denies depression, anxiety, memory loss, and confusion Heme: Denies bruising, bleeding, and enlarged lymph nodes.   Physical Exam   BP (!) 153/87 (BP Location: Right Arm, Patient Position: Sitting, Cuff Size: Large)   Pulse 61   Temp 97.7 F (36.5 C) (Oral)   Ht 5' 8 (1.727 m)   Wt 214 lb 6.4 oz (97.3 kg)   SpO2 98%   BMI 32.60 kg/m  General:   Alert and oriented. Pleasant and cooperative. Well-nourished and well-developed.  Head:  Normocephalic and atraumatic. Eyes:  Without icterus Abdomen:  +BS, soft,  non-tender and non-distended. No HSM noted. Limited as patient in chair Rectal:  Deferred  Msk:  Symmetrical without gross deformities. Normal posture. Extremities:  Without edema. Neurologic:  Alert and  oriented x4;  grossly normal neurologically. Skin:  Intact without significant lesions or rashes. Psych:  Alert and cooperative. Normal mood and affect.   Assessment   Timothy [name] is a 78 y.o. male presenting today with a history of bladder cancer, DM, CAD with stent, HLD, HTN, NSTEMI, PVD, hepatic steatosis, constipation, SIBO, last seen Aug 2025 with constipation and started on Miralax. Returns today to discuss.   Some improvement noted in constipation and bowel movement frequency, and he has had improvement in discomfort. No red flags such as postprandial abdominal pain, weight loss, food aversion, etc. I suspect dietary intake strongly affecting as well, and he notes blow outs after eating greens.  Will trial Linzess 290 mcg daily as we have samples here. If this is helpful, the prescription has already been sent to his pharmacy to continue. May need to trial Amitiza or can decrease dosage if too strong.   Hepatic steatosis: normal LFTs, no recent CBC on file, can discuss at next visit.    PLAN    Linzess 290 mcg samples and prescription sent, may need to titrate lower, if no improvement can trial Amitiza Return in 6-8 weeks Can discuss hepatic steatosis at next visit although doubt has any advanced fibrosis.    Therisa MICAEL Stager, PhD, ANP-BC [facility] Gastroenterology   I have reviewed  the note and agree with the APP's assessment as described in this progress note  Toribio Fortune, MD Gastroenterology and Hepatology [facility]

## 2024-10-03 NOTE — Progress Notes (Signed)
 Medication Samples have been provided to the patient.  Drug name: Linzess       Strength: 290 mcg        Qty: 2  LOT: [id number]  Exp.Date: 10/04/2025  Dosing instructions: take one before breakfast each morning.   The patient has been instructed regarding the correct time, dose, and frequency of taking this medication, including desired effects and most common side effects.   Timothy [name] 10:17 AM 10/03/2024

## 2024-10-07 ENCOUNTER — Ambulatory Visit: Attending: Cardiology | Admitting: Cardiology

## 2024-10-07 ENCOUNTER — Encounter: Payer: Self-pay | Admitting: Cardiology

## 2024-10-07 VITALS — BP 150/87 | HR 65 | Ht 68.0 in | Wt 215.0 lb

## 2024-10-07 DIAGNOSIS — I255 Ischemic cardiomyopathy: Secondary | ICD-10-CM

## 2024-10-07 DIAGNOSIS — I2510 Atherosclerotic heart disease of native coronary artery without angina pectoris: Secondary | ICD-10-CM

## 2024-10-07 DIAGNOSIS — I10 Essential (primary) hypertension: Secondary | ICD-10-CM | POA: Diagnosis not present

## 2024-10-07 DIAGNOSIS — E782 Mixed hyperlipidemia: Secondary | ICD-10-CM | POA: Diagnosis not present

## 2024-10-07 MED ORDER — LOSARTAN POTASSIUM 50 MG PO TABS: 50.0000 mg | ORAL_TABLET | Freq: Every day | ORAL | 3 refills | Status: AC

## 2024-10-07 NOTE — Patient Instructions (Signed)
 Medication Instructions:   Your physician has recommended you make the following change in your medication:   -Increase Losartan  to 50 mg once daily    *If you need a refill on your cardiac medications before your next appointment, please call your pharmacy*  Lab Work: In 2 weeks:  -BMET -FLP  If you have labs (blood work) drawn today and your tests are completely normal, you will receive your results only by: MyChart Message (if you have MyChart) OR A paper copy in the mail If you have any lab test that is abnormal or we need to change your treatment, we will call you to review the results.  Testing/Procedures: None  Follow-Up: At [facility], you and your health needs are our priority.  As part of our continuing mission to provide you with exceptional heart care, our providers are all part of one team.  This team includes your primary Cardiologist (physician) and Advanced Practice Providers or APPs (Physician Assistants and Nurse Practitioners) who all work together to provide you with the care you need, when you need it.  Your next appointment:   6 month(s)  Provider:   You may see Alvan Carrier, MD or one of the following Advanced Practice Providers on your designated Care Team:   Laymon Qua, PA-C  Scotesia Albertson, [place] Olivia Pavy, [place]     We recommend signing up for the patient portal called MyChart.  Sign up information is provided on this After Visit Summary.  MyChart is used to connect with patients for Virtual Visits (Telemedicine).  Patients are able to view lab/test results, encounter notes, upcoming appointments, etc.  Non-urgent messages can be sent to your provider as well.   To learn more about what you can do with MyChart, go to [email or web address].   Other Instructions

## 2024-10-07 NOTE — Progress Notes (Signed)
 Clinical Summary Timothy [name] is a 78 y.o.male seen today for follow up of the following medical problems.    1. HTN  - norvasc  that we started last visit caused foot pain, stopped and resolved.  -he had concerns hydralazine  was causing cramps. We tried stopping, cramps did resolve off medication.    - he is compliant with meds      2. Hyperlipidemia - intolerant to statins. He reports muscle aches on zetia , now off.  - labs followed by pcp   - 01/2020 TC 206 TG 364 HDL 28 LDL 114 -prior authorization for repatha  approved - 08/2023 TC 113 TG 273 HDL 31 LDL 40 - recent A1c 7.8   - compliant with repatha .      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 Echo LVEF 50-55%, inferior hypokinesis, grade I diastolic dysfunction. - admit 11/2016 with NSTEMI. Cath as reported below. Received balloon angioplasty of mid to distal LCX, too small to be stented.  - echo 11/2016 LVEF 40-45%.       - Jan 2019 nuclear stress small moderate intensity partially reversible mid to apical anterior defect consistent with mild ischemia. LVEF 25% by nuclear, however echo 02/2018 showed LVEF stabel 40-45%.   07/2022 echo: LVEF 45-50%   -denies any chest pains, no SOB/DOE EKG today SR, inferior TWIs that are chronic     4. PAD - 07/2018 ABIs right 1.12, left 0.8 - followed by vascular. Has had left SFA stenting on 2 separate occasions most recent 06/2023   - Jan 2021 normal ABIs, normal left SFA stent.         5. History of bladder cancer - followed by urology, s/p recesection Notes mention low volume prostate cancer as well  6. DM2 - followed by endocrine.      AAA screen: neg US  2018 Past Medical History:  Diagnosis Date   Arthritis    Bladder cancer (HCC)    Blood clot in vein 2018   left   CAD (coronary artery disease)    a. 06/2014: NSTEMI s/p PTCA of LCx, 60-80% residual in distal LAD, 70% prox small D1.   b. 02/06/15 NSTEMI  s/p successful  PCI/DES to mid RCA c. angioplasty alone to LCx in 11/2016   Decreased GFR 45 % per 01-04-2021 labs   Diabetes mellitus, type 2 (HCC)    Elevated PSA    HLD (hyperlipidemia)    HOH (hard of hearing)    Hypertension    NSTEMI (non-ST elevated myocardial infarction) (HCC) 2015   Peripheral vascular disease    Poor historian    Wears hearing aid in right ear      Allergies  Allergen Reactions   Lipitor [Atorvastatin ] Other (See Comments)    Myalgia   Other Other (See Comments)    All Cholesterol Meds - causes muscle and joint pain   Pravastatin  Other (See Comments)    Myalgia     Current Outpatient Medications  Medication Sig Dispense Refill   acetaminophen  (TYLENOL ) 500 MG tablet Take 500 mg by mouth every 6 (six) hours as needed (pain).     aspirin  EC 81 MG tablet Take 81 mg by mouth daily. Swallow whole.     carvedilol  (COREG ) 12.5 MG tablet TAKE 1 TABLET BY MOUTH TWICE A DAY 180 tablet 3   dapagliflozin  propanediol (FARXIGA ) 5 MG TABS tablet Take 1 tablet (5 mg total) by mouth daily before breakfast. 90 tablet 3  Evolocumab  (REPATHA  SURECLICK) 140 MG/ML SOAJ INJECT 140 MG INTO THE SKIN EVERY 14 (FOURTEEN) DAYS. 6 mL 3   gabapentin (NEURONTIN) 100 MG capsule Take 100 mg by mouth 3 (three) times daily.     glimepiride  (AMARYL ) 2 MG tablet Take 1 tablet (2 mg total) by mouth 2 (two) times daily. 180 tablet 3   glucose blood (ONETOUCH ULTRA TEST) test strip Use a (one) strip to test blood sugars twice a day as directed by Provider. 100 each 05   linaclotide (LINZESS) 290 MCG CAPS capsule Take 1 capsule (290 mcg total) by mouth daily before breakfast. 90 capsule 3   linaclotide (LINZESS) 290 MCG CAPS capsule Take 1 capsule (290 mcg total) by mouth daily before breakfast.     losartan  (COZAAR ) 50 MG tablet Take 1 tablet (50 mg total) by mouth daily. 90 tablet 3   tolterodine (DETROL LA) 4 MG 24 hr capsule Take 1 capsule (4 mg total) by mouth daily. 30 capsule 11   TRADJENTA  5 MG TABS  tablet Take 1 tablet (5 mg total) by mouth daily. 90 tablet 3   No current facility-administered medications for this visit.     Past Surgical History:  Procedure Laterality Date   ABDOMINAL AORTOGRAM W/LOWER EXTREMITY N/A 08/28/2018   Procedure: ABDOMINAL AORTOGRAM W/LOWER EXTREMITY;  Surgeon: Serene Gaile ORN, MD;  Location: [facility];  Service: Cardiovascular;  Laterality: N/A;  bilateral   ABDOMINAL AORTOGRAM W/LOWER EXTREMITY N/A 07/09/2019   Procedure: ABDOMINAL AORTOGRAM W/LOWER EXTREMITY;  Surgeon: Serene Gaile ORN, MD;  Location: [facility];  Service: Cardiovascular;  Laterality: N/A;  unilateral Lt   ABDOMINAL AORTOGRAM W/LOWER EXTREMITY N/A 06/13/2023   Procedure: ABDOMINAL AORTOGRAM W/LOWER EXTREMITY;  Surgeon: Serene Gaile ORN, MD;  Location: [facility];  Service: Cardiovascular;  Laterality: N/A;   CARDIAC CATHETERIZATION  02/06/2015   Procedure: CORONARY STENT INTERVENTION;  Surgeon: Alm ORN Clay, MD;  Location: [facility] CATH LAB;  Service: Cardiovascular;;  Mid RCA   CARDIAC CATHETERIZATION N/A 11/11/2016   Procedure: Left Heart Cath and Coronary Angiography;  Surgeon: Candyce GORMAN Reek, MD;  Location: [facility] INVASIVE CV LAB;  Service: Cardiovascular;  Laterality: N/A;   CARDIAC CATHETERIZATION N/A 11/11/2016   Procedure: Coronary Balloon Angioplasty;  Surgeon: Candyce GORMAN Reek, MD;  Location: [facility] INVASIVE CV LAB;  Service: Cardiovascular;  Laterality: N/A;   COLONOSCOPY N/A 01/14/2019   Procedure: COLONOSCOPY;  Surgeon: Golda Claudis PENNER, MD;  Location: [facility];  Service: Endoscopy;  Laterality: N/A;  730   COLONOSCOPY N/A 05/07/2024   Procedure: COLONOSCOPY;  Surgeon: Eartha Angelia Sieving, MD;  Location: [facility];  Service: Gastroenterology;  Laterality: N/A;  900am, asa 3   CORONARY ANGIOPLASTY  11/11/2016   Mid Cx to Dist Cx lesion, 100 %stenosed. This lesion was treated with balloon angioplasty with a 2.0 balloon   CYSTOSCOPY W/ RETROGRADES N/A  05/05/2015   Procedure: CYSTOSCOPY WITH BILATERAL RETROGRADE PYELOGRAMS AND BLADDER BIOPSIES;  Surgeon: Garnette Shack, MD;  Location: [facility];  Service: Urology;  Laterality: N/A;   CYSTOSCOPY W/ RETROGRADES Bilateral 07/31/2018   Procedure: CYSTOSCOPY WITH BILATERAL RETROGRADE PYELOGRAM;  Surgeon: Shack Garnette, MD;  Location: [facility];  Service: Urology;  Laterality: Bilateral;   CYSTOSCOPY W/ RETROGRADES Right 02/15/2021   Procedure: CYSTOSCOPY WITH RETROGRADE PYELOGRAM;  Surgeon: Shack Garnette, MD;  Location: [facility];  Service: Urology;  Laterality: Right;   CYSTOSCOPY WITH BIOPSY N/A 07/31/2018   Procedure: CYSTOSCOPY WITH BIOPSY;  Surgeon: Matilda Senior, MD;  Location: [facility];  Service: Urology;  Laterality: N/A;   CYSTOSCOPY WITH BIOPSY N/A 02/15/2021   Procedure: CYSTOSCOPY WITH BIOPSY;  Surgeon: Matilda Senior, MD;  Location: [facility];  Service: Urology;  Laterality: N/A;   LEFT HEART CATHETERIZATION WITH CORONARY ANGIOGRAM N/A 06/17/2014   Procedure: LEFT HEART CATHETERIZATION WITH CORONARY ANGIOGRAM;  Surgeon: Alm LELON Clay, MD;  Location: [facility] CATH LAB;  Service: Cardiovascular;  Laterality: N/A;   LEFT HEART CATHETERIZATION WITH CORONARY ANGIOGRAM N/A 02/06/2015   Procedure: LEFT HEART CATHETERIZATION WITH CORONARY ANGIOGRAM;  Surgeon: Alm LELON Clay, MD;  Location: [facility] CATH LAB;  Service: Cardiovascular;  Laterality: N/A;   PERIPHERAL VASCULAR ATHERECTOMY  08/28/2018   Procedure: PERIPHERAL VASCULAR ATHERECTOMY;  Surgeon: Serene Gaile LELON, MD;  Location: [facility];  Service: Cardiovascular;;  Prox lt.SFA, Distal SFA   PERIPHERAL VASCULAR INTERVENTION  07/09/2019   Procedure: PERIPHERAL VASCULAR INTERVENTION;  Surgeon: Serene Gaile LELON, MD;  Location: [facility];  Service: Cardiovascular;;  Lt. SFA   PERIPHERAL VASCULAR INTERVENTION  06/13/2023   Procedure: PERIPHERAL VASCULAR INTERVENTION;  Surgeon: Serene Gaile LELON, MD;   Location: [facility];  Service: Cardiovascular;;  Lt SFA   POLYPECTOMY  01/14/2019   Procedure: POLYPECTOMY;  Surgeon: Golda Claudis PENNER, MD;  [facility];  Service: Endoscopy;;  colon   PROSTATE BIOPSY N/A 03/31/2014   Procedure: BIOPSY TRANSRECTAL ULTRASONIC PROSTATE (TUBP);  Surgeon: Senior Matilda, MD;  Location: [facility];  Service: Urology;  Laterality: N/A;   PROSTATE BIOPSY N/A 02/15/2021   Procedure: BIOPSY TRANSRECTAL ULTRASONIC PROSTATE (TUBP);  Surgeon: Matilda Senior, MD;  Location: [facility];  Service: Urology;  Laterality: N/A;  45 MINS   TRANSURETHRAL RESECTION OF BLADDER TUMOR N/A 03/14/2013   Procedure: TRANSURETHRAL RESECTION OF BLADDER TUMOR (TURBT);  Surgeon: Senior Matilda, MD;  Location: [facility];  Service: Urology;  Laterality: N/A;  1 HR WITH MITOMYCIN  INSTILLATION    TRANSURETHRAL RESECTION OF BLADDER TUMOR N/A 09/30/2013   Procedure: TRANSURETHRAL RESECTION OF BLADDER TUMOR (TURBT);  Surgeon: Senior Matilda, MD;  Location: [facility];  Service: Urology;  Laterality: N/A;   TRANSURETHRAL RESECTION OF BLADDER TUMOR N/A 03/31/2014   Procedure: TRANSURETHRAL RESECTION OF BLADDER TUMOR (TURBT);  Surgeon: Senior Matilda, MD;  Location: [facility];  Service: Urology;  Laterality: N/A;   TRANSURETHRAL RESECTION OF BLADDER TUMOR N/A 05/31/2015   Procedure: Cystoscopy with clott evacuation and fulgeration of bleeders, retrograde pylegram;  Surgeon: Ricardo Likens, MD;  Location: [facility];  Service: Urology;  Laterality: N/A;     Allergies  Allergen Reactions   Lipitor [Atorvastatin ] Other (See Comments)    Myalgia   Other Other (See Comments)    All Cholesterol Meds - causes muscle and joint pain   Pravastatin  Other (See Comments)    Myalgia      Family History  Problem Relation Age of Onset   Asthma Mother    Cancer Child    Colon cancer Neg Hx       Social History Timothy [name] reports that he quit smoking about 31 years ago. His smoking use included cigarettes. He started smoking about 61 years ago. He has a 30 pack-year smoking history. He has never used smokeless tobacco. Timothy [name] reports no history of alcohol  use.     Physical Examination Today's Vitals   10/07/24 1014 10/07/24 1038  BP: (!) 156/82 (!) 150/87  Pulse: 65   SpO2: 100%   Weight: 215 lb (97.5 kg)   Height: 5' 8 (1.727 m)    Body mass index is 32.69 kg/m.  Gen: resting comfortably, no acute distress HEENT: no scleral icterus, pupils equal round and reactive, no palptable cervical adenopathy,  CV: RRR, no mrg, no jvd Resp: Clear to auscultation bilaterally GI: abdomen is soft, non-tender, non-distended, normal bowel sounds, no hepatosplenomegaly MSK: extremities are warm, no edema.  Skin: warm, no rash Neuro:  no focal deficits Psych: appropriate affect   Diagnostic Studies  06/2014 Cath FINDINGS:   Hemodynamics:   Central Aortic Pressure / Mean: 108/62/83 mmHg Left Ventricular Pressure / LVEDP: 107/7/17 mmHg   Left Ventriculography: EF: ~45% % Wall Motion: global Hypokinesis   Coronary Anatomy: Dominance: Right Left Main: Large caliber, long trunk that trifurcates into the LAD, Circumflex & Ramus Intermedius. Angiographically normal. LAD: Begins as a large caliber vessel that tapers into a relatively small caliber (~1.5-2.0 mm) vessel distally.  It wraps the apex, perfusing the distal 1/3 of the infero-apex. The distal vessel has tandem 60&70-80% focal lesions prior to the apex. D1: Very small caliber bifurcating vessel with proximal ~70% stenosis.   Left Circumflex: Begins as a large caliber vessel that also tapers to a small to moderate caliber vessel in the AV Groove where it gives off a small caliber OM1 Zacharia Sowles and is 100% occluded just beyond that point.    Post-PTCA: beyond the occluded AV Groove Circumflex, there is a small to  moderate caliber LPL Alira Fretwell.   Ramus intermedius: Large caliber vessel that bifurcates in the mid-vessel into to small to moderate caliber branches that both reach the apex.    RCA: Large caliber, dominant vessel with diffuse ~20-40% lesions, but no obstructive disease.  It bifurcates distally in to a small caliber, short PDA and Right Posterior AV Groove Tove Wideman (RPAV) branches.  RPAV gives off several very small banches.   After reviewing the initial angiography, the culprit lesion was thought to be the occluded distal-AV Groove circumflesx.  Preparation were made to proceed with PTCA on this lesion.   Percutaneous Coronary Intervention:   Guide: 6 Fr   CLS 3.5  -- very difficult guide support, catheter moved & disengaged with patient movement Guidewire: BMW (advanced initially in to an atrial Minda Faas for support & balloon advanced into proximal Circumflex allowing for further advancmemt of the wire beyond the culprit lesion. Predilation Balloon #1: Emerge 1.5 mm x 12 mm; Used to assist with wire support to advance guidewire 6 Atm x 30 Sec, 8 Atm x 30 Sec Post inflation revealed a small to moderate follow-on circumflex with LPL Sallyann Kinnaird.    Due to the relatively small sized distal vessel, the decision was to perform PTCA only. Balloon #2: Euphora 2.0 mm x 12 mm;   8 Atm x 90 Sec x 2 with IC NTG - no recoil Final Diameter: ~2.0 mm   Post deployment angiography in multiple views, with and without guidewire in place revealed excellent stent deployment and lesion coverage.  There was no evidence of dissection or perforation.   POST-OPERATIVE DIAGNOSIS:   Severe 2 vessel disease in small caliber vessel - distal AV Groove Circumflex 100% and distal LAD ~60&80% lesions (1.5 mm vessel) Successful PTCA of the occluded AV Groove Circumflex with Stent-like result. Moderately reduced LVEF by LV Gram Normal LVEDP     06/2014 Echo Study Conclusions  - Left ventricle: The cavity size was normal. Wall  thickness was  normal. Systolic function was normal. The estimated ejection   fraction was in the range of 50% to 55%. Inferior hypokinesis.   Doppler parameters are consistent with abnormal left ventricular   relaxation (grade 1 diastolic dysfunction). The E/e&' ratio is   15, suggesting borderline elevated LV filling pressure. - Aortic valve: Trileaflet. Sclerosis without stenosis. There was   no regurgitation. - Left atrium: The atrium was normal in size. - Tricuspid valve: There was mild regurgitation. - Pulmonary arteries: PA peak pressure: 33 mm Hg (S).  Impressions:  - LVEF 50-55%, inferior hypokinesis, mild TR, top normal RVSP,   diastolic dysfunction with borderline elevated LV filling   pressure.     11/2016 echo Study Conclusions   - Left ventricle: The cavity size was normal. Wall thickness was   normal. Systolic function was mildly to moderately reduced. The   estimated ejection fraction was in the range of 40% to 45%. There   is akinesis of the basal-midinferior and inferoseptal myocardium.   Doppler parameters are consistent with abnormal left ventricular   relaxation (grade 1 diastolic dysfunction). - Aortic valve: Trileaflet; mildly thickened, mildly calcified   leaflets. There was trivial regurgitation.   Impressions:   - EF is mildly reduced when compared to prior.     11/2016 cath There is mild left ventricular systolic dysfunction. The left ventricular ejection fraction is 45-50% by visual estimate. Mid RCA-2 lesion, 25 %stenosed. Patent mid RCA stent. Ost 2nd Diag to 2nd Diag lesion, 80 %stenosed. Unchanged from prior. Dist LAD lesion, 60 %stenosed. Unchanged from prior. Mid Cx to Dist Cx lesion, 100 %stenosed. This lesion was treated with balloon angioplasty with a 2.0 balloon. Several inflations were performed. Post intervention, there is a 0% residual stenosis. The vessel was too small to be stented. There is also significant tortuosity in the  proximal vessel. LV end diastolic pressure is mildly elevated.   Initially, Brilinta  was given for antiplatelet therapy in the Cath Lab. Since he did not receive a stent, we will transition him to clopidogrel .  He needs aggressive secondary prevention including lipid-lowering therapy. He has been intolerant to statins in the past. Would need to consider PCS K9 inhibitor along with other secondary prevention. I anticipate he'll be here 2 days in the hospital. Echocardiogram would be reasonable to check ejection fraction and mitral apparatus.   LAD and diagonal disease was not addressed since they appeared unchanged angiographically. If he has refractory angina, could address the LAD lesion at a later time.   Jan 2018 AAA US  No AAA     Jan 2019 nuclear stress No diagnostic ST segment changes. Occasional PVCs noted throughout the study. Hypertensive response. Technically low risk Duke treadmill score 4.5. Blood pressure demonstrated a hypertensive response to exercise. Small, moderate intensity, partially reversible mid to apical anterior defect consistent with ischemia. This is a high risk study predominantly based on calculated LVEF. Consider echocardiogram for further confirmation. Nuclear stress EF: 25%.     02/2018 echo Study Conclusions   - Left ventricle: Mid and basal inferior wall hypokinesis. The   cavity size was mildly dilated. Wall thickness was normal.   Systolic function was mildly to moderately reduced. The estimated   ejection fraction was in the range of 40% to 45%. - Aortic valve: There was trivial regurgitation. Valve area (VTI):   1.51 cm^2. Valve area (Vmax): 1.75 cm^2. Valve area (Vmean): 1.64   cm^2. - Atrial septum: No defect or patent foramen ovale was identified. - Pulmonary  arteries: PA peak pressure: 37 mm Hg (S).     07/2022 echo 1. Left ventricular ejection fraction, by estimation, is 45 to 50%. The  left ventricle has mildly decreased function. The left  ventricle has no  regional wall motion abnormalities. Left ventricular diastolic parameters  are indeterminate. The average left   ventricular global longitudinal strain is -17.3 %.   2. Right ventricular systolic function is normal. The right ventricular  size is normal. Tricuspid regurgitation signal is inadequate for assessing  PA pressure.   3. The mitral valve is normal in structure. Mild mitral valve  regurgitation. No evidence of mitral stenosis.   4. The tricuspid valve is abnormal.   5. The aortic valve is tricuspid. There is mild calcification of the  aortic valve. There is mild thickening of the aortic valve. Aortic valve  regurgitation is mild. Aortic valve sclerosis/calcification is present,  without any evidence of aortic  stenosis.   6. The inferior vena cava is normal in size with greater than 50%  respiratory variability, suggesting right atrial pressure of 3 mmHg.        Assessment and Plan    1. HTN - elevated today. Side effects to norvacs and hydralazine . Renal function reasonable enough to increase losartan  to 50mg  daily, check bmet 2 weeks   2. Hyperlipidemia - history of CAD and PAD - intolerant to statins and zetia .  - on repatha ,LDL has been at goal. Continue current therapy   3. CAD/ICM - Medical therapy limited by renal dysfunction and mixed compliance. Most recent echo low normal to mildly decreased LVEF. - denies symptoms, continue current meds      Dorn PHEBE Ross, M.D.

## 2024-10-09 DIAGNOSIS — E782 Mixed hyperlipidemia: Secondary | ICD-10-CM | POA: Diagnosis not present

## 2024-10-09 DIAGNOSIS — I10 Essential (primary) hypertension: Secondary | ICD-10-CM | POA: Diagnosis not present

## 2024-10-10 LAB — LIPID PANEL
Chol/HDL Ratio: 3.3 ratio (ref 0.0–5.0)
Cholesterol, Total: 106 mg/dL (ref 100–199)
HDL: 32 mg/dL — ABNORMAL LOW (ref >39)
LDL Chol Calc (NIH): 41 mg/dL (ref 0–99)
Triglycerides: 207 mg/dL — ABNORMAL HIGH (ref 0–149)
VLDL Cholesterol Cal: 33 mg/dL (ref 5–40)

## 2024-10-10 LAB — BASIC METABOLIC PANEL WITH GFR
BUN/Creatinine Ratio: 11 (ref 10–24)
BUN: 21 mg/dL (ref 8–27)
CO2: 22 mmol/L (ref 20–29)
Calcium: 9.2 mg/dL (ref 8.6–10.2)
Chloride: 102 mmol/L (ref 96–106)
Creatinine, Ser: 1.97 mg/dL — ABNORMAL HIGH (ref 0.76–1.27)
Glucose: 147 mg/dL — ABNORMAL HIGH (ref 70–99)
Potassium: 4.8 mmol/L (ref 3.5–5.2)
Sodium: 140 mmol/L (ref 134–144)
eGFR: 34 mL/min/1.73 — ABNORMAL LOW (ref >59)

## 2024-10-29 ENCOUNTER — Encounter: Payer: Self-pay | Admitting: Gastroenterology

## 2024-11-01 ENCOUNTER — Other Ambulatory Visit: Payer: Self-pay | Admitting: Cardiology

## 2024-11-01 DIAGNOSIS — E7800 Pure hypercholesterolemia, unspecified: Secondary | ICD-10-CM

## 2024-11-01 DIAGNOSIS — I214 Non-ST elevation (NSTEMI) myocardial infarction: Secondary | ICD-10-CM

## 2024-11-07 ENCOUNTER — Other Ambulatory Visit: Payer: Self-pay | Admitting: Cardiology

## 2024-12-09 ENCOUNTER — Encounter: Payer: Self-pay | Admitting: Nurse Practitioner

## 2024-12-09 ENCOUNTER — Ambulatory Visit: Admitting: Nurse Practitioner

## 2024-12-09 VITALS — BP 128/78 | HR 64 | Ht 68.0 in | Wt 216.0 lb

## 2024-12-09 DIAGNOSIS — I10 Essential (primary) hypertension: Secondary | ICD-10-CM

## 2024-12-09 DIAGNOSIS — Z7984 Long term (current) use of oral hypoglycemic drugs: Secondary | ICD-10-CM

## 2024-12-09 DIAGNOSIS — N1831 Chronic kidney disease, stage 3a: Secondary | ICD-10-CM

## 2024-12-09 DIAGNOSIS — E1122 Type 2 diabetes mellitus with diabetic chronic kidney disease: Secondary | ICD-10-CM

## 2024-12-09 DIAGNOSIS — E782 Mixed hyperlipidemia: Secondary | ICD-10-CM

## 2024-12-09 LAB — POCT GLYCOSYLATED HEMOGLOBIN (HGB A1C): Hemoglobin A1C: 7.7 % — AB (ref 4.0–5.6)

## 2024-12-09 LAB — POCT UA - MICROALBUMIN
Creatinine, POC: 300 mg/dL
Microalbumin Ur, POC: 30 mg/L

## 2024-12-09 NOTE — Progress Notes (Signed)
 "                                                                        Endocrinology Follow Up Note       12/09/2024, 10:51 AM   Subjective:    Patient ID: Timothy [name], male    DOB: 78-Nov-1947.  79 is being seen in follow up after being seen in consultation for management of currently uncontrolled symptomatic diabetes requested by  Bevely Doffing, FNP.   Past Medical History:  Diagnosis Date   Arthritis    Bladder cancer (HCC)    Blood clot in vein 2018   left   CAD (coronary artery disease)    a. 06/2014: NSTEMI s/p PTCA of LCx, 60-80% residual in distal LAD, 70% prox small D1.   b. 02/06/15 NSTEMI  s/p successful PCI/DES to mid RCA c. angioplasty alone to LCx in 11/2016   Decreased GFR 45 % per 01-04-2021 labs   Diabetes mellitus, type 2 (HCC)    Elevated PSA    HLD (hyperlipidemia)    HOH (hard of hearing)    Hypertension    NSTEMI (non-ST elevated myocardial infarction) (HCC) 2015   Peripheral vascular disease    Poor historian    Wears hearing aid in right ear     Past Surgical History:  Procedure Laterality Date   ABDOMINAL AORTOGRAM W/LOWER EXTREMITY N/A 08/28/2018   Procedure: ABDOMINAL AORTOGRAM W/LOWER EXTREMITY;  Surgeon: Serene Gaile ORN, MD;  [facility];  Service: Cardiovascular;  Laterality: N/A;  bilateral   ABDOMINAL AORTOGRAM W/LOWER EXTREMITY N/A 07/09/2019   Procedure: ABDOMINAL AORTOGRAM W/LOWER EXTREMITY;  Surgeon: Serene Gaile ORN, MD;  Location: [facility];  Service: Cardiovascular;  Laterality: N/A;  unilateral Lt   ABDOMINAL AORTOGRAM W/LOWER EXTREMITY N/A 06/13/2023   Procedure: ABDOMINAL AORTOGRAM W/LOWER EXTREMITY;  Surgeon: Serene Gaile ORN, MD;  Location: [facility];  Service: Cardiovascular;  Laterality: N/A;   CARDIAC CATHETERIZATION  02/06/2015   Procedure: CORONARY STENT INTERVENTION;  Surgeon: Alm ORN Clay, MD;  Location: [facility] CATH LAB;  Service: Cardiovascular;;  Mid RCA   CARDIAC CATHETERIZATION  N/A 11/11/2016   Procedure: Left Heart Cath and Coronary Angiography;  Surgeon: Candyce GORMAN Reek, MD;  Location: [facility] INVASIVE CV LAB;  Service: Cardiovascular;  Laterality: N/A;   CARDIAC CATHETERIZATION N/A 11/11/2016   Procedure: Coronary Balloon Angioplasty;  Surgeon: Candyce GORMAN Reek, MD;  Location: [facility] INVASIVE CV LAB;  Service: Cardiovascular;  Laterality: N/A;   COLONOSCOPY N/A 01/14/2019   Procedure: COLONOSCOPY;  Surgeon: Golda Claudis PENNER, MD;  [facility]: [facility];  Service: Endoscopy;  Laterality: N/A;  730   COLONOSCOPY N/A 05/07/2024   Procedure: COLONOSCOPY;  Surgeon: Eartha Angelia Sieving, MD;  [facility]: [facility];  Service: Gastroenterology;  Laterality: N/A;  900am, asa 3   CORONARY ANGIOPLASTY  11/11/2016   Mid Cx to Dist Cx lesion, 100 %stenosed. This lesion was treated with balloon angioplasty with a 2.0 balloon   CYSTOSCOPY W/ RETROGRADES N/A 05/05/2015   Procedure: CYSTOSCOPY WITH BILATERAL RETROGRADE PYELOGRAMS AND BLADDER BIOPSIES;  Surgeon: Garnette Shack, MD;  Location: [facility];  Service: Urology;  Laterality: N/A;   CYSTOSCOPY W/ RETROGRADES Bilateral 07/31/2018  Procedure: CYSTOSCOPY WITH BILATERAL RETROGRADE PYELOGRAM;  Surgeon: Matilda Senior, MD;  Location: [facility];  Service: Urology;  Laterality: Bilateral;   CYSTOSCOPY W/ RETROGRADES Right 02/15/2021   Procedure: CYSTOSCOPY WITH RETROGRADE PYELOGRAM;  Surgeon: Matilda Senior, MD;  Location: [facility];  Service: Urology;  Laterality: Right;   CYSTOSCOPY WITH BIOPSY N/A 07/31/2018   Procedure: CYSTOSCOPY WITH BIOPSY;  Surgeon: Matilda Senior, MD;  Location: [facility];  Service: Urology;  Laterality: N/A;   CYSTOSCOPY WITH BIOPSY N/A 02/15/2021   Procedure: CYSTOSCOPY WITH BIOPSY;  Surgeon: Matilda Senior, MD;  Location: [facility];  Service: Urology;  Laterality: N/A;   LEFT HEART CATHETERIZATION WITH CORONARY ANGIOGRAM N/A 06/17/2014   Procedure: LEFT HEART  CATHETERIZATION WITH CORONARY ANGIOGRAM;  Surgeon: Alm LELON Clay, MD;  Location: [facility] CATH LAB;  Service: Cardiovascular;  Laterality: N/A;   LEFT HEART CATHETERIZATION WITH CORONARY ANGIOGRAM N/A 02/06/2015   Procedure: LEFT HEART CATHETERIZATION WITH CORONARY ANGIOGRAM;  Surgeon: Alm LELON Clay, MD;  Location: [facility] CATH LAB;  Service: Cardiovascular;  Laterality: N/A;   PERIPHERAL VASCULAR ATHERECTOMY  08/28/2018   Procedure: PERIPHERAL VASCULAR ATHERECTOMY;  Surgeon: Serene Gaile LELON, MD;  [facility];  Service: Cardiovascular;;  Prox lt.SFA, Distal SFA   PERIPHERAL VASCULAR INTERVENTION  07/09/2019   Procedure: PERIPHERAL VASCULAR INTERVENTION;  Surgeon: Serene Gaile LELON, MD;  [facility];  Service: Cardiovascular;;  Lt. SFA   PERIPHERAL VASCULAR INTERVENTION  06/13/2023   Procedure: PERIPHERAL VASCULAR INTERVENTION;  Surgeon: Serene Gaile LELON, MD;  [facility];  Service: Cardiovascular;;  Lt SFA   POLYPECTOMY  01/14/2019   Procedure: POLYPECTOMY;  Surgeon: Golda Claudis PENNER, MD;  [facility];  Service: Endoscopy;;  colon   PROSTATE BIOPSY N/A 03/31/2014   Procedure: BIOPSY TRANSRECTAL ULTRASONIC PROSTATE (TUBP);  Surgeon: Senior Matilda, MD;  Location: [facility];  Service: Urology;  Laterality: N/A;   PROSTATE BIOPSY N/A 02/15/2021   Procedure: BIOPSY TRANSRECTAL ULTRASONIC PROSTATE (TUBP);  Surgeon: Matilda Senior, MD;  Location: [facility];  Service: Urology;  Laterality: N/A;  45 MINS   TRANSURETHRAL RESECTION OF BLADDER TUMOR N/A 03/14/2013   Procedure: TRANSURETHRAL RESECTION OF BLADDER TUMOR (TURBT);  Surgeon: Senior Matilda, MD;  Location: [facility];  Service: Urology;  Laterality: N/A;  1 HR WITH MITOMYCIN  INSTILLATION    TRANSURETHRAL RESECTION OF BLADDER TUMOR N/A 09/30/2013   Procedure: TRANSURETHRAL RESECTION OF BLADDER TUMOR (TURBT);  Surgeon: Senior Matilda, MD;   Location: [facility];  Service: Urology;  Laterality: N/A;   TRANSURETHRAL RESECTION OF BLADDER TUMOR N/A 03/31/2014   Procedure: TRANSURETHRAL RESECTION OF BLADDER TUMOR (TURBT);  Surgeon: Senior Matilda, MD;  Location: [facility];  Service: Urology;  Laterality: N/A;   TRANSURETHRAL RESECTION OF BLADDER TUMOR N/A 05/31/2015   Procedure: Cystoscopy with clott evacuation and fulgeration of bleeders, retrograde pylegram;  Surgeon: Ricardo Likens, MD;  Location: [facility];  Service: Urology;  Laterality: N/A;    Social History   Socioeconomic History   Marital status: Married    Spouse name: Not on file   Number of children: Not on file   Years of education: Not on file   Highest education level: Not on file  Occupational History   Not on file  Tobacco Use   Smoking status: Former    Current packs/day: 0.00    Average packs/day: 1 pack/day for 30.0 years (30.0 ttl pk-yrs)  Types: Cigarettes    Start date: 03/12/1963    Quit date: 03/11/1993    Years since quitting: 31.7   Smokeless tobacco: Never  Vaping Use   Vaping status: Never Used  Substance and Sexual Activity   Alcohol  use: No    Alcohol /week: 0.0 standard drinks of alcohol    Drug use: No   Sexual activity: Not Currently  Other Topics Concern   Not on file  Social History Narrative   Not on file   Social Drivers of Health   Tobacco Use: Medium Risk (12/09/2024)   Patient History    Smoking Tobacco Use: Former    Smokeless Tobacco Use: Never    Passive Exposure: Not on Actuary Strain: Low Risk (09/26/2023)   Overall Financial Resource Strain (CARDIA)    Difficulty of Paying Living Expenses: Not very hard  Food Insecurity: No Food Insecurity (09/11/2023)   Hunger Vital Sign    Worried About Running Out of Food in the Last Year: Never true    Ran Out of Food in the Last Year: Never true  Transportation Needs: No Transportation Needs (09/26/2023)   PRAPARE -  Administrator, Civil Service (Medical): No    Lack of Transportation (Non-Medical): No  Physical Activity: Sufficiently Active (09/11/2023)   Exercise Vital Sign    Days of Exercise per Week: 5 days    Minutes of Exercise per Session: 30 min  Stress: Not on file  Social Connections: Not on file  Depression (EYV7-0): Not on file  Alcohol  Screen: Not on file  Housing: Low Risk (11/14/2022)   Housing    Last Housing Risk Score: 0  Utilities: Not At Risk (11/14/2022)   AHC Utilities    Threatened with loss of utilities: No  Health Literacy: Not on file    Family History  Problem Relation Age of Onset   Asthma Mother    Cancer Child    Colon cancer Neg Hx     Outpatient Encounter Medications as of 12/09/2024  Medication Sig   acetaminophen  (TYLENOL ) 500 MG tablet Take 500 mg by mouth every 6 (six) hours as needed (pain).   aspirin  EC 81 MG tablet Take 81 mg by mouth daily. Swallow whole.   carvedilol  (COREG ) 12.5 MG tablet TAKE 1 TABLET BY MOUTH TWICE A DAY   dapagliflozin  propanediol (FARXIGA ) 5 MG TABS tablet Take 1 tablet (5 mg total) by mouth daily before breakfast.   Evolocumab  (REPATHA  SURECLICK) 140 MG/ML SOAJ INJECT 140 MG INTO THE SKIN EVERY 14 (FOURTEEN) DAYS.   gabapentin (NEURONTIN) 100 MG capsule Take 100 mg by mouth 3 (three) times daily.   glimepiride  (AMARYL ) 2 MG tablet Take 1 tablet (2 mg total) by mouth 2 (two) times daily.   glucose blood (ONETOUCH ULTRA TEST) test strip Use a (one) strip to test blood sugars twice a day as directed by Provider.   linaclotide  (LINZESS ) 290 MCG CAPS capsule Take 1 capsule (290 mcg total) by mouth daily before breakfast.   linaclotide  (LINZESS ) 290 MCG CAPS capsule Take 1 capsule (290 mcg total) by mouth daily before breakfast.   losartan  (COZAAR ) 50 MG tablet Take 1 tablet (50 mg total) by mouth daily.   tolterodine  (DETROL  LA) 4 MG 24 hr capsule Take 1 capsule (4 mg total) by mouth daily.   TRADJENTA  5 MG TABS tablet  Take 1 tablet (5 mg total) by mouth daily.   No facility-administered encounter medications on file as of 12/09/2024.  ALLERGIES: Allergies  Allergen Reactions   Lipitor [Atorvastatin ] Other (See Comments)    Myalgia   Other Other (See Comments)    All Cholesterol Meds - causes muscle and joint pain   Pravastatin  Other (See Comments)    Myalgia    VACCINATION STATUS: Immunization History  Administered Date(s) Administered   INFLUENZA, HIGH DOSE SEASONAL PF 08/29/2017, 08/16/2018   Influenza-Unspecified 09/08/2014    Diabetes He presents for his follow-up diabetic visit. He has type 2 diabetes mellitus. Onset time: diagnosed at approx age of 63. His disease course has been improving. There are no hypoglycemic associated symptoms. Associated symptoms include polyuria (on Jardiance ). There are no hypoglycemic complications. Symptoms are improving. Diabetic complications include nephropathy. Risk factors for coronary artery disease include diabetes mellitus, dyslipidemia, family history, male sex, obesity, hypertension and sedentary lifestyle. Current diabetic treatment includes oral agent (triple therapy). He is compliant with treatment most of the time. His weight is fluctuating minimally. He is following a generally unhealthy diet. When asked about meal planning, he reported none. He has not had a previous visit with a dietitian. He rarely participates in exercise. His home blood glucose trend is decreasing steadily. His overall blood glucose range is 130-140 mg/dl. (He presents today with his meter showing stable, at target glycemic profile overall.  His POCT A1c today is 7.7%, improving from last visit of 7.8%.  Analysis of his meter shows 7-day average of 142, 14-day average of 136, 30-day average of 136.  He notes he generally feels pretty good, tolerating the Farxiga  well- still getting this from patient assistance.) An ACE inhibitor/angiotensin II receptor blocker is not being taken. He  sees a podiatrist.Eye exam is current.     Review of systems  Constitutional: + Minimally fluctuating body weight,  current Body mass index is 32.84 kg/m. , no fatigue, no subjective hyperthermia, no subjective hypothermia Eyes: no blurry vision, no xerophthalmia ENT: no sore throat, no nodules palpated in throat, no dysphagia/odynophagia, no hoarseness Cardiovascular: no chest pain, no shortness of breath, no palpitations, no leg swelling Respiratory: no cough, no shortness of breath Gastrointestinal: no nausea/vomiting/diarrhea Musculoskeletal: no muscle/joint aches Skin: no rashes, no hyperemia Neurological: no tremors, + numbness/ tingling to BLE- worse at night and in AM, no dizziness Psychiatric: no depression, no anxiety  Objective:     BP 128/78 (BP Location: Left Arm, Patient Position: Sitting, Cuff Size: Large)   Pulse 64   Ht 5' 8 (1.727 m)   Wt 216 lb (98 kg)   BMI 32.84 kg/m   Wt Readings from Last 3 Encounters:  12/09/24 216 lb (98 kg)  10/07/24 215 lb (97.5 kg)  10/03/24 214 lb 6.4 oz (97.3 kg)     BP Readings from Last 3 Encounters:  12/09/24 128/78  10/07/24 (!) 150/87  10/03/24 (!) 153/87     Physical Exam- Limited  Constitutional:  Body mass index is 32.84 kg/m. , not in acute distress, normal state of mind, HOH wears bilateral hearing aids Eyes:  EOMI, no exophthalmos Musculoskeletal: no gross deformities, strength intact in all four extremities, no gross restriction of joint movements Skin:  no rashes, no hyperemia Neurological: no tremor with outstretched hands   Diabetic Foot Exam - Simple   Simple Foot Form Visual Inspection No deformities, no ulcerations, no other skin breakdown bilaterally: Yes Sensation Testing Intact to touch and monofilament testing bilaterally: Yes Pulse Check Posterior Tibialis and Dorsalis pulse intact bilaterally: Yes Comments     CMP ( most recent) CMP  Component Value Date/Time   NA 140 10/09/2024  1523   K 4.8 10/09/2024 1523   CL 102 10/09/2024 1523   CO2 22 10/09/2024 1523   GLUCOSE 147 (H) 10/09/2024 1523   GLUCOSE 201 (H) 06/13/2023 0859   BUN 21 10/09/2024 1523   CREATININE 1.97 (H) 10/09/2024 1523   CREATININE 1.43 (H) 02/25/2020 1148   CALCIUM  9.2 10/09/2024 1523   PROT 7.1 03/12/2024 1221   ALBUMIN 4.6 03/12/2024 1221   AST 19 03/12/2024 1221   ALT 12 03/12/2024 1221   ALKPHOS 73 03/12/2024 1221   BILITOT 0.5 03/12/2024 1221   GFRNONAA 45 (L) 01/04/2021 0953   GFRAA 47 (L) 07/09/2019 0801     Diabetic Labs (most recent): Lab Results  Component Value Date   HGBA1C 7.7 (A) 12/09/2024   HGBA1C 7.8 (A) 01/16/2024   HGBA1C 7.3 (A) 10/09/2023   MICROALBUR 30 mg/L 12/09/2024   MICROALBUR 80 10/09/2023   MICROALBUR 30 mg'l 10/10/2022     Lipid Panel ( most recent) Lipid Panel     Component Value Date/Time   CHOL 106 10/09/2024 1523   TRIG 207 (H) 10/09/2024 1523   HDL 32 (L) 10/09/2024 1523   CHOLHDL 3.3 10/09/2024 1523   CHOLHDL 5.5 11/12/2016 0101   VLDL 39 11/12/2016 0101   LDLCALC 41 10/09/2024 1523   LABVLDL 33 10/09/2024 1523      Lab Results  Component Value Date   TSH 1.17 08/23/2022   TSH 1.805 02/06/2015           Assessment & Plan:   1) Type 2 diabetes mellitus with stage 3a chronic kidney disease, without long-term current use of insulin  (HCC)  He presents today with his meter showing stable, at target glycemic profile overall.  His POCT A1c today is 7.7%, improving from last visit of 7.8%.  Analysis of his meter shows 7-day average of 142, 14-day average of 136, 30-day average of 136.  He notes he generally feels pretty good, tolerating the Farxiga  well- still getting this from patient assistance.  - Nicolas Banh has currently uncontrolled symptomatic type 2 DM since 79 years of age.   -Recent labs reviewed.  POCT UM today was normal.  - I had a long discussion with him about the progressive nature of diabetes and the  pathology behind its complications. -his diabetes is complicated by mild CKD and he remains at a high risk for more acute and chronic complications which include CAD, CVA, CKD, retinopathy, and neuropathy. These are all discussed in detail with him.  The following Lifestyle Medicine recommendations according to American College of Lifestyle Medicine [facility]) were discussed and offered to patient and he agrees to start the journey:  A. Whole Foods, Plant-based plate comprising of fruits and vegetables, plant-based proteins, whole-grain carbohydrates was discussed in detail with the patient.   A list for source of those nutrients were also provided to the patient.  Patient will use only water  or unsweetened tea for hydration. B.  The need to stay away from risky substances including alcohol , smoking; obtaining 7 to 9 hours of restorative sleep, at least 150 minutes of moderate intensity exercise weekly, the importance of healthy social connections,  and stress reduction techniques were discussed. C.  A full color page of  Calorie density of various food groups per pound showing examples of each food groups was provided to the patient.  - Nutritional counseling repeated/built upon at each appointment.  - The patient admits there is a room  for improvement in their diet and drink choices. -  Suggestion is made for the patient to avoid simple carbohydrates from their diet including Cakes, Sweet Desserts / Pastries, Ice Cream, Soda (diet and regular), Sweet Tea, Candies, Chips, Cookies, Sweet Pastries, Store Bought Juices, Alcohol  in Excess of 1-2 drinks a day, Artificial Sweeteners, Coffee Creamer, and Sugar-free Products. This will help patient to have stable blood glucose profile and potentially avoid unintended weight gain.   - I encouraged the patient to switch to unprocessed or minimally processed complex starch and increased protein intake (animal or plant source), fruits, and vegetables.   - Patient  is advised to stick to a routine mealtimes to eat 3 meals a day and avoid unnecessary snacks (to snack only to correct hypoglycemia).  - I have approached him with the following individualized plan to manage his diabetes and patient agrees:   -Based on stable, at goal glycemic profile, no changes will be made to his regimen today.  He is advised to continue Glimepiride  2 mg po twice daily with meals, therapeutically suitable for patient, Tradjenta  5 mg po daily, and Farxiga  5 mg po daily.   -he is encouraged to start monitoring glucose twice daily, before breakfast and before bed, and to call the clinic if he has readings less than 70 or above 300 for 3 tests in a row.  - Adjustment parameters are given to him for hypo and hyperglycemia in writing.  - Specific targets for  A1c; LDL, HDL, and Triglycerides were discussed with the patient.  2) Blood Pressure /Hypertension:  his blood pressure is controlled to target.   he is advised to continue his current medications as prescribed by PCP.  3) Lipids/Hyperlipidemia:    Review of his recent lipid panel from 10/09/24 showed controlled LDL at 41 and elevated triglycerides of 207 (improving).  He is statin intolerant says his PCP started him on Repatha .    4)  Weight/Diet:  his Body mass index is 32.84 kg/m.  -  clearly complicating his diabetes care.   he is a candidate for weight loss. I discussed with him the fact that loss of 5 - 10% of his  current body weight will have the most impact on his diabetes management.  Exercise, and detailed carbohydrates information provided  -  detailed on discharge instructions.  5) Chronic Care/Health Maintenance: -he is not on ACEI/ARB and is intolerant to Statin medications and is encouraged to initiate and continue to follow up with Ophthalmology, Dentist, Podiatrist at least yearly or according to recommendations, and advised to stay away from smoking. I have recommended yearly flu vaccine and pneumonia  vaccine at least every 5 years; moderate intensity exercise for up to 150 minutes weekly; and sleep for at least 7 hours a day.  - he is advised to maintain close follow up with Bevely Doffing, FNP for primary care needs, as well as his other providers for optimal and coordinated care.  I did encourage him to try OTC Magnesium Glycinate to help with his neuropathy.     I spent  30  minutes in the care of the patient today including review of labs from CMP, Lipids, Thyroid  Function, Hematology (current and previous including abstractions from other facilities); face-to-face time discussing  his blood glucose readings/logs, discussing hypoglycemia and hyperglycemia episodes and symptoms, medications doses, his options of short and long term treatment based on the latest standards of care / guidelines;  discussion about incorporating lifestyle medicine;  and documenting the  encounter. Risk reduction counseling performed per USPSTF guidelines to reduce obesity and cardiovascular risk factors.     Please refer to Patient Instructions for Blood Glucose Monitoring and Insulin /Medications Dosing Guide  in media tab for additional information. Please  also refer to  Patient Self Inventory in the Media  tab for reviewed elements of pertinent patient history.  Silver Sauers participated in the discussions, expressed understanding, and voiced agreement with the above plans.  All questions were answered to his satisfaction. he is encouraged to contact clinic should he have any questions or concerns prior to his return visit.   Follow up plan: - Return in about 6 months (around 06/08/2025) for Diabetes F/U with A1c in office, No previsit labs, Bring meter and logs.  Benton Rio, [facility] [address] [place], [place] [address] Phone: [phone] Fax: [phone]  12/09/2024, 10:51 AM    "

## 2024-12-20 ENCOUNTER — Other Ambulatory Visit: Payer: Self-pay | Admitting: Nurse Practitioner

## 2024-12-20 DIAGNOSIS — Z7984 Long term (current) use of oral hypoglycemic drugs: Secondary | ICD-10-CM

## 2024-12-20 DIAGNOSIS — N1831 Chronic kidney disease, stage 3a: Secondary | ICD-10-CM

## 2024-12-20 MED ORDER — ACCU-CHEK GUIDE W/DEVICE KIT: PACK | 0 refills | Status: AC

## 2024-12-23 ENCOUNTER — Other Ambulatory Visit: Payer: Self-pay | Admitting: Nurse Practitioner

## 2024-12-23 DIAGNOSIS — N1831 Chronic kidney disease, stage 3a: Secondary | ICD-10-CM

## 2024-12-23 DIAGNOSIS — Z7984 Long term (current) use of oral hypoglycemic drugs: Secondary | ICD-10-CM

## 2024-12-24 ENCOUNTER — Ambulatory Visit: Payer: Self-pay | Admitting: Cardiology

## 2025-01-07 ENCOUNTER — Encounter: Payer: Self-pay | Admitting: *Deleted

## 2025-01-07 ENCOUNTER — Ambulatory Visit

## 2025-01-07 NOTE — Progress Notes (Signed)
 Timothy [name]                                          MRN: [id number]   01/07/2025   The [facility] Quality Team Specialist reviewed this patient medical record for the purposes of chart review for care gap closure. The following were reviewed: chart review for care gap closure-controlling blood pressure.    [facility] Quality Team

## 2025-01-09 ENCOUNTER — Encounter: Payer: Self-pay | Admitting: Gastroenterology

## 2025-01-09 ENCOUNTER — Ambulatory Visit: Admitting: Gastroenterology

## 2025-01-09 VITALS — BP 152/83 | HR 54 | Temp 97.9°F | Ht 68.0 in | Wt 210.2 lb

## 2025-01-09 DIAGNOSIS — Z860100 Personal history of colon polyps, unspecified: Secondary | ICD-10-CM

## 2025-01-09 DIAGNOSIS — Z860101 Personal history of adenomatous and serrated colon polyps: Secondary | ICD-10-CM

## 2025-01-09 DIAGNOSIS — K760 Fatty (change of) liver, not elsewhere classified: Secondary | ICD-10-CM | POA: Insufficient documentation

## 2025-01-09 NOTE — Patient Instructions (Signed)
 Please call us  if you have any further constipation!  We can see you in 1 year.   If you would like further evaluation of the fatty liver before then, let me know!  I have included a handout for you in the meantime. It is good to keep your sugars and cholesterol under control, eat healthy, avoid processed foods, and exercise. I included some information!  Your next colonoscopy is in 2030!  I enjoyed seeing you again today! I value our relationship and want to provide genuine, compassionate, and quality care. You may receive a survey regarding your visit with me, and I welcome your feedback! Thanks so much for taking the time to complete this. I look forward to seeing you again.      Therisa MICAEL Stager, PhD, ANP-BC [facility]

## 2025-01-09 NOTE — Progress Notes (Signed)
 "   Gastroenterology Office Note     Primary Care Physician:  Bevely Doffing, FNP  Primary Gastroenterologist: previously Dr Eartha   Chief Complaint   Chief Complaint  Patient presents with   Follow-up    Follow up on constipation.pt states that the Imodium worked for him     History of Present Illness   Timothy [name] is a 79 y.o. male presenting today with a history of bladder cancer, DM, CAD with stent, HLD, HTN, NSTEMI, PVD, hepatic steatosis, constipation, SIBO, last seen in Oct 2025 and started on Linzess  290 mcg. Returns in follow-up for this.   Stated he took linzess  and had numerous stools. He took the samples and then had to get imodium. After this, he had no problems. He has a BM daily. No abdominal pain, N/V, rectal bleeding. No straining. Has not needed anything for constipation.   No GERD. Has good appetite. No N/V. Does not want to have further evaluation for hepatic steatosis currently. He would like to stay in contact with us  and return in 1 year in case anything happens in interim.    Last colonoscopy June 2025:  - Three 3 to 5 mm polyps in the transverse colon                            and in the ascending colon, removed with a cold                            snare. Resected and retrieved.                           - Diverticulosis in the sigmoid colon.                           - Non-bleeding internal hemorrhoids. (Tubular adenomas) surveillance in 5 years.    US  abdomen April 2025 with fatty liver. LFTs normal.   Past Medical History:  Diagnosis Date   Arthritis    Bladder cancer (HCC)    Blood clot in vein 2018   left   CAD (coronary artery disease)    a. 06/2014: NSTEMI s/p PTCA of LCx, 60-80% residual in distal LAD, 70% prox small D1.   b. 02/06/15 NSTEMI  s/p successful PCI/DES to mid RCA c. angioplasty alone to LCx in 11/2016   Decreased GFR 45 % per 01-04-2021 labs   Diabetes mellitus, type 2 (HCC)    Elevated PSA    HLD (hyperlipidemia)     HOH (hard of hearing)    Hypertension    NSTEMI (non-ST elevated myocardial infarction) (HCC) 2015   Peripheral vascular disease    Poor historian    Wears hearing aid in right ear     Past Surgical History:  Procedure Laterality Date   ABDOMINAL AORTOGRAM W/LOWER EXTREMITY N/A 08/28/2018   Procedure: ABDOMINAL AORTOGRAM W/LOWER EXTREMITY;  Surgeon: Serene Gaile ORN, MD;  Location: [facility];  Service: Cardiovascular;  Laterality: N/A;  bilateral   ABDOMINAL AORTOGRAM W/LOWER EXTREMITY N/A 07/09/2019   Procedure: ABDOMINAL AORTOGRAM W/LOWER EXTREMITY;  Surgeon: Serene Gaile ORN, MD;  Location: [facility];  Service: Cardiovascular;  Laterality: N/A;  unilateral Lt   ABDOMINAL AORTOGRAM W/LOWER EXTREMITY N/A 06/13/2023   Procedure: ABDOMINAL AORTOGRAM W/LOWER EXTREMITY;  Surgeon: Serene Gaile ORN, MD;  Location:  [facility];  Service: Cardiovascular;  Laterality: N/A;   CARDIAC CATHETERIZATION  02/06/2015   Procedure: CORONARY STENT INTERVENTION;  Surgeon: Alm LELON Clay, MD;  Location: [facility] CATH LAB;  Service: Cardiovascular;;  Mid RCA   CARDIAC CATHETERIZATION N/A 11/11/2016   Procedure: Left Heart Cath and Coronary Angiography;  Surgeon: Candyce GORMAN Reek, MD;  Location: [facility] INVASIVE CV LAB;  Service: Cardiovascular;  Laterality: N/A;   CARDIAC CATHETERIZATION N/A 11/11/2016   Procedure: Coronary Balloon Angioplasty;  Surgeon: Candyce GORMAN Reek, MD;  Location: [facility] INVASIVE CV LAB;  Service: Cardiovascular;  Laterality: N/A;   COLONOSCOPY N/A 01/14/2019   Procedure: COLONOSCOPY;  Surgeon: Golda Claudis PENNER, MD;  Location: [facility];  Service: Endoscopy;  Laterality: N/A;  730   COLONOSCOPY N/A 05/07/2024   Procedure: COLONOSCOPY;  Surgeon: Eartha Angelia Sieving, MD;  Location: [facility];  Service: Gastroenterology;  Laterality: N/A;  900am, asa 3   CORONARY ANGIOPLASTY  11/11/2016   Mid Cx to Dist Cx lesion, 100 %stenosed. This lesion was treated with balloon  angioplasty with a 2.0 balloon   CYSTOSCOPY W/ RETROGRADES N/A 05/05/2015   Procedure: CYSTOSCOPY WITH BILATERAL RETROGRADE PYELOGRAMS AND BLADDER BIOPSIES;  Surgeon: Garnette Shack, MD;  Location: [facility];  Service: Urology;  Laterality: N/A;   CYSTOSCOPY W/ RETROGRADES Bilateral 07/31/2018   Procedure: CYSTOSCOPY WITH BILATERAL RETROGRADE PYELOGRAM;  Surgeon: Shack Garnette, MD;  Location: [facility];  Service: Urology;  Laterality: Bilateral;   CYSTOSCOPY W/ RETROGRADES Right 02/15/2021   Procedure: CYSTOSCOPY WITH RETROGRADE PYELOGRAM;  Surgeon: Shack Garnette, MD;  Location: [facility];  Service: Urology;  Laterality: Right;   CYSTOSCOPY WITH BIOPSY N/A 07/31/2018   Procedure: CYSTOSCOPY WITH BIOPSY;  Surgeon: Shack Garnette, MD;  Location: [facility];  Service: Urology;  Laterality: N/A;   CYSTOSCOPY WITH BIOPSY N/A 02/15/2021   Procedure: CYSTOSCOPY WITH BIOPSY;  Surgeon: Shack Garnette, MD;  Location[facility];  Service: Urology;  Laterality: N/A;   LEFT HEART CATHETERIZATION WITH CORONARY ANGIOGRAM N/A 06/17/2014   Procedure: LEFT HEART CATHETERIZATION WITH CORONARY ANGIOGRAM;  Surgeon: Alm LELON Clay, MD;  Location: [facility] CATH LAB;  Service: Cardiovascular;  Laterality: N/A;   LEFT HEART CATHETERIZATION WITH CORONARY ANGIOGRAM N/A 02/06/2015   Procedure: LEFT HEART CATHETERIZATION WITH CORONARY ANGIOGRAM;  Surgeon: Alm LELON Clay, MD;  Location: [facility] CATH LAB;  Service: Cardiovascular;  Laterality: N/A;   PERIPHERAL VASCULAR ATHERECTOMY  08/28/2018   Procedure: PERIPHERAL VASCULAR ATHERECTOMY;  Surgeon: Serene Gaile LELON, MD;  Location: [facility];  Service: Cardiovascular;;  Prox lt.SFA, Distal SFA   PERIPHERAL VASCULAR INTERVENTION  07/09/2019   Procedure: PERIPHERAL VASCULAR INTERVENTION;  Surgeon: Serene Gaile LELON, MD;  Location: [facility];  Service: Cardiovascular;;  Lt. SFA   PERIPHERAL VASCULAR INTERVENTION  06/13/2023   Procedure:  PERIPHERAL VASCULAR INTERVENTION;  Surgeon: Serene Gaile LELON, MD;  Location: [facility];  Service: Cardiovascular;;  Lt SFA   POLYPECTOMY  01/14/2019   Procedure: POLYPECTOMY;  Surgeon: Golda Claudis PENNER, MD;  Location: [facility];  Service: Endoscopy;;  colon   PROSTATE BIOPSY N/A 03/31/2014   Procedure: BIOPSY TRANSRECTAL ULTRASONIC PROSTATE (TUBP);  Surgeon: Garnette Shack, MD;  Location: [facility];  Service: Urology;  Laterality: N/A;   PROSTATE BIOPSY N/A 02/15/2021   Procedure: BIOPSY TRANSRECTAL ULTRASONIC PROSTATE (TUBP);  Surgeon: Shack Garnette, MD;  Location: [facility];  Service: Urology;  Laterality: N/A;  45 MINS  TRANSURETHRAL RESECTION OF BLADDER TUMOR N/A 03/14/2013   Procedure: TRANSURETHRAL RESECTION OF BLADDER TUMOR (TURBT);  Surgeon: Garnette Shack, MD;  Location: [facility];  Service: Urology;  Laterality: N/A;  1 HR WITH MITOMYCIN  INSTILLATION    TRANSURETHRAL RESECTION OF BLADDER TUMOR N/A 09/30/2013   Procedure: TRANSURETHRAL RESECTION OF BLADDER TUMOR (TURBT);  Surgeon: Garnette Shack, MD;  Location: [facility];  Service: Urology;  Laterality: N/A;   TRANSURETHRAL RESECTION OF BLADDER TUMOR N/A 03/31/2014   Procedure: TRANSURETHRAL RESECTION OF BLADDER TUMOR (TURBT);  Surgeon: Garnette Shack, MD;  Location: [facility];  Service: Urology;  Laterality: N/A;   TRANSURETHRAL RESECTION OF BLADDER TUMOR N/A 05/31/2015   Procedure: Cystoscopy with clott evacuation and fulgeration of bleeders, retrograde pylegram;  Surgeon: Ricardo Likens, MD;  Location: [facility];  Service: Urology;  Laterality: N/A;    Current Outpatient Medications  Medication Sig Dispense Refill   acetaminophen  (TYLENOL ) 500 MG tablet Take 500 mg by mouth every 6 (six) hours as needed (pain).     aspirin  EC 81 MG tablet Take 81 mg by mouth daily. Swallow whole.     Blood Glucose Monitoring Suppl (ACCU-CHEK  GUIDE) w/Device KIT start monitoring glucose twice daily, before breakfast and before bed, and to call the clinic if he has readings less than 70 or above 300 for 3 tests in a row. E11.22 1 kit 0   carvedilol  (COREG ) 12.5 MG tablet TAKE 1 TABLET BY MOUTH TWICE A DAY 180 tablet 3   dapagliflozin  propanediol (FARXIGA ) 5 MG TABS tablet Take 1 tablet (5 mg total) by mouth daily before breakfast. 90 tablet 3   Evolocumab  (REPATHA  SURECLICK) 140 MG/ML SOAJ INJECT 140 MG INTO THE SKIN EVERY 14 (FOURTEEN) DAYS. 6 mL 3   gabapentin (NEURONTIN) 100 MG capsule Take 100 mg by mouth 3 (three) times daily.     glimepiride  (AMARYL ) 2 MG tablet Take 1 tablet (2 mg total) by mouth 2 (two) times daily. 180 tablet 3   glucose blood test strip start monitoring glucose twice daily, before breakfast and before bed, and to call the clinic if he has readings less than 70 or above 300 for 3 tests in a row. E11.22 300 each 2   linaclotide  (LINZESS ) 290 MCG CAPS capsule Take 1 capsule (290 mcg total) by mouth daily before breakfast. 90 capsule 3   linaclotide  (LINZESS ) 290 MCG CAPS capsule Take 1 capsule (290 mcg total) by mouth daily before breakfast.     losartan  (COZAAR ) 50 MG tablet Take 1 tablet (50 mg total) by mouth daily. 90 tablet 3   tolterodine  (DETROL  LA) 4 MG 24 hr capsule Take 1 capsule (4 mg total) by mouth daily. 30 capsule 11   TRADJENTA  5 MG TABS tablet Take 1 tablet (5 mg total) by mouth daily. 90 tablet 3   No current facility-administered medications for this visit.    Allergies as of 01/09/2025 - Review Complete 01/09/2025  Allergen Reaction Noted   Lipitor [atorvastatin ] Other (See Comments) 08/07/2014   Other Other (See Comments) 05/28/2015   Pravastatin  Other (See Comments) 08/07/2014    Family History  Problem Relation Age of Onset   Asthma Mother    Cancer Child    Colon cancer Neg Hx     Social History   Socioeconomic History   Marital status: Married    Spouse name: Not on file    Number of children: Not on file   Years of education: Not on  file   Highest education level: Not on file  Occupational History   Not on file  Tobacco Use   Smoking status: Former    Current packs/day: 0.00    Average packs/day: 1 pack/day for 30.0 years (30.0 ttl pk-yrs)    Types: Cigarettes    Start date: 03/12/1963    Quit date: 03/11/1993    Years since quitting: 31.8   Smokeless tobacco: Never  Vaping Use   Vaping status: Never Used  Substance and Sexual Activity   Alcohol  use: No    Alcohol /week: 0.0 standard drinks of alcohol    Drug use: No   Sexual activity: Not Currently  Other Topics Concern   Not on file  Social History Narrative   Not on file   Social Drivers of Health   Tobacco Use: Medium Risk (01/09/2025)   Patient History    Smoking Tobacco Use: Former    Smokeless Tobacco Use: Never    Passive Exposure: Not on file  Financial Resource Strain: Low Risk (09/26/2023)   Overall Financial Resource Strain (CARDIA)    Difficulty of Paying Living Expenses: Not very hard  Food Insecurity: No Food Insecurity (09/11/2023)   Hunger Vital Sign    Worried About Running Out of Food in the Last Year: Never true    Ran Out of Food in the Last Year: Never true  Transportation Needs: No Transportation Needs (09/26/2023)   PRAPARE - Administrator, Civil Service (Medical): No    Lack of Transportation (Non-Medical): No  Physical Activity: Sufficiently Active (09/11/2023)   Exercise Vital Sign    Days of Exercise per Week: 5 days    Minutes of Exercise per Session: 30 min  Stress: Not on file  Social Connections: Not on file  Intimate Partner Violence: Not on file  Depression (EYV7-0): Not on file  Alcohol  Screen: Not on file  Housing: Low Risk (11/14/2022)   Housing    Last Housing Risk Score: 0  Utilities: Not At Risk (11/14/2022)   AHC Utilities    Threatened with loss of utilities: No  Health Literacy: Not on file     Review of Systems   Gen: Denies  any fever, chills, fatigue, weight loss, lack of appetite.  CV: Denies chest pain, heart palpitations, peripheral edema, syncope.  Resp: Denies shortness of breath at rest or with exertion. Denies wheezing or cough.  GI: Denies dysphagia or odynophagia. Denies jaundice, hematemesis, fecal incontinence. GU : Denies urinary burning, urinary frequency, urinary hesitancy MS: Denies joint pain, muscle weakness, cramps, or limitation of movement.  Derm: Denies rash, itching, dry skin Psych: Denies depression, anxiety, memory loss, and confusion Heme: Denies bruising, bleeding, and enlarged lymph nodes.   Physical Exam   BP (!) 152/83   Pulse (!) 54   Temp 97.9 F (36.6 C)   Ht 5' 8 (1.727 m)   Wt 210 lb 3.2 oz (95.3 kg)   BMI 31.96 kg/m  General:   Alert and oriented. Pleasant and cooperative. Well-nourished and well-developed.  Head:  Normocephalic and atraumatic. Eyes:  Without icterus Abdomen:  +BS, soft, non-tender and non-distended. No HSM noted. No guarding or rebound. No masses appreciated.  Rectal:  Deferred  Msk:  Symmetrical without gross deformities. Normal posture. Extremities:  Without edema. Neurologic:  Alert and  oriented x4;  grossly normal neurologically. Skin:  Intact without significant lesions or rashes. Psych:  Alert and cooperative. Normal mood and affect.   Assessment/Plan:    Timothy [name] is  a delightful 79 y.o. male presenting today with a history of bladder cancer, DM, CAD with stent, HLD, HTN, NSTEMI, PVD, hepatic steatosis, constipation, SIBO, last seen in Oct 2025 and returning in follow-up.  Constipation has completely resolved, and he is doing wonderfully without need for any prescriptive or OTC agents. Interestingly, he took the Linzess  samples and had numerous stools requiring Imodium, and his bowel habits have normalized.   GERD is managed with dietary changes and no concerns.  Hx of tubular adenomas with next colonoscopy in 2030 if health  permits.  Hepatic steatosis seen on US  in April 2025, LFTs normal, no other recent labs to review. Several MASH risk factors. He is declining further evaluation now but will call if interested.  He would like to return in 1 year, even though we are not managing anything currently. We will see him in 1 year or sooner if needed.      Therisa MICAEL Stager, PhD, ANP-BC [facility]    "

## 2025-03-25 ENCOUNTER — Other Ambulatory Visit: Admitting: Urology

## 2025-05-09 ENCOUNTER — Ambulatory Visit: Payer: Self-pay

## 2025-06-09 ENCOUNTER — Ambulatory Visit: Admitting: Nurse Practitioner
